# Patient Record
Sex: Female | Born: 1955 | Race: Black or African American | Hispanic: No | Smoking: Former smoker
Health system: Southern US, Community
[De-identification: ages and names within clinical notes are randomized; demographics above are authoritative.]

## PROBLEM LIST (undated history)

## (undated) ENCOUNTER — Emergency Department (HOSPITAL_COMMUNITY)

## (undated) DIAGNOSIS — I10 Essential (primary) hypertension: Secondary | ICD-10-CM

## (undated) DIAGNOSIS — N951 Menopausal and female climacteric states: Secondary | ICD-10-CM

## (undated) DIAGNOSIS — R5383 Other fatigue: Secondary | ICD-10-CM

## (undated) DIAGNOSIS — M25472 Effusion, left ankle: Secondary | ICD-10-CM

## (undated) DIAGNOSIS — R011 Cardiac murmur, unspecified: Secondary | ICD-10-CM

## (undated) DIAGNOSIS — M25474 Effusion, right foot: Secondary | ICD-10-CM

## (undated) DIAGNOSIS — G4730 Sleep apnea, unspecified: Secondary | ICD-10-CM

## (undated) DIAGNOSIS — K824 Cholesterolosis of gallbladder: Secondary | ICD-10-CM

## (undated) DIAGNOSIS — E785 Hyperlipidemia, unspecified: Secondary | ICD-10-CM

## (undated) DIAGNOSIS — M25471 Effusion, right ankle: Secondary | ICD-10-CM

## (undated) DIAGNOSIS — R7303 Prediabetes: Secondary | ICD-10-CM

## (undated) DIAGNOSIS — D509 Iron deficiency anemia, unspecified: Secondary | ICD-10-CM

## (undated) DIAGNOSIS — J449 Chronic obstructive pulmonary disease, unspecified: Secondary | ICD-10-CM

## (undated) DIAGNOSIS — Z800 Family history of malignant neoplasm of digestive organs: Secondary | ICD-10-CM

## (undated) DIAGNOSIS — T7840XA Allergy, unspecified, initial encounter: Secondary | ICD-10-CM

## (undated) DIAGNOSIS — F419 Anxiety disorder, unspecified: Secondary | ICD-10-CM

## (undated) DIAGNOSIS — J45909 Unspecified asthma, uncomplicated: Secondary | ICD-10-CM

## (undated) DIAGNOSIS — K219 Gastro-esophageal reflux disease without esophagitis: Secondary | ICD-10-CM

## (undated) DIAGNOSIS — M25475 Effusion, left foot: Secondary | ICD-10-CM

## (undated) DIAGNOSIS — Z8042 Family history of malignant neoplasm of prostate: Secondary | ICD-10-CM

## (undated) DIAGNOSIS — I639 Cerebral infarction, unspecified: Secondary | ICD-10-CM

## (undated) DIAGNOSIS — R0602 Shortness of breath: Secondary | ICD-10-CM

## (undated) DIAGNOSIS — M1990 Unspecified osteoarthritis, unspecified site: Secondary | ICD-10-CM

## (undated) DIAGNOSIS — C50919 Malignant neoplasm of unspecified site of unspecified female breast: Secondary | ICD-10-CM

## (undated) HISTORY — DX: Family history of malignant neoplasm of digestive organs: Z80.0

## (undated) HISTORY — DX: Cholesterolosis of gallbladder: K82.4

## (undated) HISTORY — DX: Effusion, left foot: M25.475

## (undated) HISTORY — DX: Allergy, unspecified, initial encounter: T78.40XA

## (undated) HISTORY — DX: Cardiac murmur, unspecified: R01.1

## (undated) HISTORY — DX: Gastro-esophageal reflux disease without esophagitis: K21.9

## (undated) HISTORY — DX: Shortness of breath: R06.02

## (undated) HISTORY — DX: Anxiety disorder, unspecified: F41.9

## (undated) HISTORY — DX: Effusion, right ankle: M25.471

## (undated) HISTORY — DX: Hyperlipidemia, unspecified: E78.5

## (undated) HISTORY — DX: Essential (primary) hypertension: I10

## (undated) HISTORY — DX: Effusion, left ankle: M25.472

## (undated) HISTORY — DX: Effusion, right foot: M25.474

## (undated) HISTORY — DX: Family history of malignant neoplasm of prostate: Z80.42

## (undated) HISTORY — DX: Iron deficiency anemia, unspecified: D50.9

## (undated) HISTORY — DX: Other fatigue: R53.83

## (undated) HISTORY — DX: Menopausal and female climacteric states: N95.1

## (undated) HISTORY — DX: Malignant neoplasm of unspecified site of unspecified female breast: C50.919

## (undated) HISTORY — DX: Chronic obstructive pulmonary disease, unspecified: J44.9

## (undated) HISTORY — DX: Cerebral infarction, unspecified: I63.9

## (undated) HISTORY — DX: Unspecified osteoarthritis, unspecified site: M19.90

## (undated) HISTORY — DX: Prediabetes: R73.03

## (undated) HISTORY — PX: COSMETIC SURGERY: SHX468

## (undated) HISTORY — DX: Unspecified asthma, uncomplicated: J45.909

## (undated) NOTE — Progress Notes (Signed)
 Formatting of this note might be different from the original. Care Management Initial Assessment Patient Name: Paige Hester MRN: [id number] Visit Number (CSN): [id number] Insurance: Payor: MEDICARE / Plan: MEDICARE PART A & B / Product Type: Medicare /   Plan: Home with family and no services.  Patient is her own decision maker, no MPOA, verified her name and date of birth at this time, patient visiting from out of town (but all demographics noted correct), and her preferred point of contact on son Leafy Mikes [phone].  PCP: Dr. LOIS Gaskins June 2025.  Pharmacy: [facility] loop.  Assessed Patient By Assessed the patient by: In person interview Contact's Relationship to Patient: Patient  Interpreter Services Does the patient or family require interpretation?: No  Demographics Admitted from: Transfer Who assists you with performing your ADLs?: Self  Patient Information ADL Prior to Hospitalization: Independent Current living situation: House Living Arrangements: Family members;Children Support System: Immediate family Responsibilities/Dependents at home: No Currently using DME?: Yes Type of DME:  (cpap) Needs additional DME?: No Currently receiving outpatient dialysis?: No  Discharge Plan Anticipated discharge plan: Home Transport to discharge destination is: Private vehicle Expected Discharge Date: 11/19/23  Financial Information Insurance listed correct?: (P) Yes Barriers to obtaining healthcare: (P) No Resources provided?: (P) No  Clinical Information / Patient History Have you been hospitalized within the last year?: (P) Yes How long ago?: (P) 6 months What were you admitted for?: (P) surgery in March 2025 to remove breast cancer At what hospital were you admitted?: (P) in [place], [place]  IMM letter signed?: (P) Yes (reviewd with the patient, she signed, and a copy provided.) Patient and/or family were provided with choice of  facilities/services that are available and appropriate to meet post hospital care needs?: (P) Home - no needs Patient/Family agree with discharge plan?: (P) Yes  Completed by: Janeen Pacini, RN   [facility] CASE MANAGEMENT [id number] [place] [address] Phone: [phone] Fax: [phone]  Electronically signed by Pacini Janeen, RN at 11/17/2023 12:01 PM CDT

## (undated) NOTE — Unmapped External Note (Signed)
 Summary: skin assessment  Formatting of this note might be different from the original. Encounter:  Initial   Screening/Prevention Status:  7 day screening  Recommendations: MOBILITY, ACTIVITY or SENSORY PERCEPTION RECOMMENDATION(s):   Turn/reposition Q2H and Pressure redistribution MOISTURE MANAGEMENT RECOMMENDATION(s):   Breathable absorbent pads and Avoid plastic back pads All skin intact to anterior and posterior of body with no evidence of pressure injuries. Patient will continue to be monitored via routine skin assessments with frequency based on braden scale. Current Braden score of 23. Patient able to turn independently at time of assessment. Educated patient on the importance of turning.   Electronically signed by Chandra Domino, RN at 11/17/2023  1:04 PM CDT

## (undated) NOTE — Progress Notes (Signed)
 Formatting of this note is different from the original. Patient ID: Paige Hester is a 89 y.o. female.  Chief Complaint  Patient presents with   Back Pain    Lower back pain x3w. Patient had a CT and is now requesting an MRI because she is not better.   Subjective:    History of Present Illness Paige Hester is a 78 year old female who presents with recurrent low back pain. She is temporarily residing in the area while visiting her son and grandchild.  She has been experiencing severe low back pain for a couple of weeks, which is so intense that she is unable to stand or sit for more than two minutes, necessitating lying down most of the time. The pain initially began following a fall in July (3-73mo ago), after which imaging showed no fractures or sprains. Tylenol  was effective in resolving her symptoms until October.  Recently, she visited an emergency center due to the severity of her back pain. She was told by the emergency center that the CT scan did not show any concerning findings. She received a pain shot and a steroid injection, which provided temporary relief, along with a five-day course of medication and a muscle relaxer. However, the pain has returned after discontinuing the medication.  The pain is localized to the low back and extends to the bottom of her buttocks but does not radiate down her legs. No urinary incontinence, thigh numbness, or other neurological symptoms. She is able to perform daily stretching exercises without pain, but prolonged sitting exacerbates her symptoms.  She is currently not taking any medications for her back pain, as the previous course of treatment has ended.   Objective:  BP (!) 144/77 (BP Location: Right arm, Patient Position: Sitting)   Pulse 67   Temp 36.7 C (98 F) (Temporal)   Resp 16   Ht 1.651 m (5' 5)   Wt 110.7 kg (244 lb)   LMP  (LMP Unknown)   SpO2 98%   BMI 40.60 kg/m    Physical Exam  Physical Exam   Constitutional: She  is oriented to person, place, and time. She is well-developed, overweight and well-nourished.  Eyes: Pupils are equal, round, and reactive to light. EOM are normal.    Extraocular Movements: EOM normal.   Cardiovascular:  Murmur heard. Pulmonary/Chest: Effort normal and breath sounds normal.  Musculoskeletal:     Comments: Clinically aligned without step-off's. TTP along the right para-spinal muscles of the lumbar. FROM of the spine without pain    Results CT (Performed at [facility] in July 2025): Severe spinal canal stenosis @ L4-L5. Multi-level mild to moderate neural foraminal stenosis      Assessment and Plan:   Assessment & Plan  1. Spinal stenosis of lumbar region without neurogenic claudication (Primary)  2. Chronic right-sided low back pain without sciatica -     MRI Lumbar spine wo contrast; Future; Expected date: 01/26/2024  Other orders -     Naproxen; Take 1 tablet (500 mg total) by mouth 2 (two) times daily with meals  for 7 days  Dispense: 14 tablet; Refill: 0 -     Cyclobenzaprine HCl; Take 1 tablet (10 mg total) by mouth 3 (three) times daily as needed for Muscle spasms  Dispense: 30 tablet; Refill: 0  Low back pain Chronic low back pain exacerbated, localized to lower back and buttocks, no radiation, no urinary incontinence or thigh numbness. Previous CT scan negative for acute findings; positive for spinal  stenosis. Pain improved with steroids and muscle relaxers but recurred post-discontinuation. - Ordered MRI of lumbar spine. - Prescribed naproxen with food. - Prescribed muscle relaxers. - Advised scheduling MRI promptly. - Recommended discussing with primary care provider for orthopedic or physical therapy referral as she is currently visiting from out of state.  The entire treatment plan and recommendations were discussed with the patient prior to the conclusion of the visit. Patient verbalized understanding and is comfortable with the  plan. No other concerns or questions at this time.    Metta PHEBE Pili, PA-C  Electronically signed by Pili Metta FALCON, PA-C at 01/26/2024 12:50 PM CST

## (undated) NOTE — Unmapped External Note (Signed)
 Formatting of this note might be different from the original. Patient is discharged home.vital sings stable, discharge education provided, patient to be picked up by the son.Iv line removed,bleeding controlled, tele monitor taken off. Electronically signed by Kathalene Kenneth Burr, RN at 11/18/2023  7:33 PM CDT

## (undated) NOTE — Progress Notes (Signed)
 Formatting of this note might be different from the original. Discharge planning completed with the patient while at bedside. Plan is for the patient to return home with family without any services. No questions or concerns voiced at this time. Will continue to follow for all discharge needs.  Electronically signed by Gail Lust, RN at 11/18/2023 10:20 AM CDT

## (undated) NOTE — Consults (Signed)
 Associated Order(s): IP CONSULT TO CARDIOLOGY Formatting of this note might be different from the original. Clinical Nutrition Education  Reason for Education:  Provider consult  Behavior/Environmental Findings:  Food, nutrition, and nutrition-related knowledge deficit  Patient/Family instructed on following diet/nutrition topic: Weight Management  The following MNT handout(s) was/were Provided/discussed Handout(s): Weight reduction tips , Heart healthy shopping tips, out dining tips, low sodium seasoning tips   Discussed with patient/family: Diet modifications  Learning Barriers: N/A  Level of Understanding:  Good  Receptivity:  Good  Expectations for Compliance:  Good   Interpreter Services utilized?  No  Need for follow up inpatient/outpatient education?  YES, patient to benefit from OP RD services  Pt sitting in bed. Brief discussion of handouts . No concerns or questions reported  Patient/family made aware they may request additional education, at least 24 hours prior to discharge.  Please consult RD upon patient's request.  Sonja Forest, RD, LD   Electronically signed by Forest Sonja, RD, LD at 11/17/2023 10:24 AM CDT

## (undated) NOTE — Unmapped External Note (Signed)
 Formatting of this note might be different from the original.  Problem: Infection Risk Goal: Stabilize- Infection Risk Description: Increased chance of contamination with disease-producing germs. Outcome: Stabilized  Electronically signed by Bevely Setter, RN at 11/17/2023 12:42 AM CDT

## (undated) NOTE — Progress Notes (Signed)
 Formatting of this note is different from the original. Images from the original note were not included.   Subjective: Patient seen and examined bedside.  Currently denies chest pain or shortness of breath.  Family at bedside.  Objective:  In no acute distress.  Scheduled Meds:  amLODIPine   10 mg Oral Daily   aspirin   81 mg Oral Daily   CHOLEcalciferol  1,000 Units Oral Daily   docusate sodium   100 mg Oral BID   furosemide (Lasix) IV injection  20 mg Intravenous BID (Diuretic)   heparin   5,000 Units Subcutaneous Q8H SCH   Insulin Lispro  4-20 Units Subcutaneous TID with meals   losartan -hydrochlorothiazide  100-12.5 combo dose   Oral Daily   rosuvastatin   20 mg Oral HS   spironolactone  25 mg Oral Daily   Continuous Infusions:  PRN Meds:acetaminophen , dextrose, dextrose, dextrose, dextrose, glucagon, hydrALAZINE, HYDROcodone -acetaminophen , melatonin, nitroglycerin, ondansetron   Objective: Vital signs in last 24 hours: Temp:  [36.2 C (97.1 F)-36.7 C (98 F)] 36.2 C (97.2 F) Heart Rate:  [62-78] 72 Resp:  [16-24] 16 BP: (120-165)/(50-73) 120/69  Intake/Output last 3 shifts:  No intake/output data recorded.  Intake/Output this shift: No intake/output data recorded.  PHYSICAL EXAM   General Appearance:    Alert, cooperative, no distress  Head:    Normocephalic,  atraumatic  Eyes:    PERRL, conjunctiva/corneas clear, EOM's intact  Throat: Moist mucous membranes  Neck:   Supple,  Lungs:     Clear to auscultation bilaterally, respirations unlabored  Heart:    Regular rate and rhythm, S1 and S2 normal,  Abdomen:     Soft, non-tender, bowel sounds active all four quadrants,    no masses, no organomegaly  Extremities:   Extremities normal, atraumatic, no cyanosis or edema   skin:   Skin color, texture, turgor normal, no rashes or lesions  Neurologic:   CNII-XII intact. Normal strength, sensation and reflexes      throughout   Labs: CBC: Recent Labs  Lab  11/17/23 0430  WBC 7.0  RBC 4.03  PLT 270  HGB 11.2*  HCT 34.3*  MCV 85.1  MCH 27.8  MCHC 32.7  RDW 13.4  MPV 9.5   CMP: Recent Labs  Lab 11/17/23 0401  CALCIUM  9.3  ALBUMIN 3.1*  NA 142  K 2.8*  CO2 28  CL 105  BUN 16  CREATININE 0.80   Coags     Urinalysis      No results found for this or any previous visit. Microbiology Results (Current Admission)     None     No results found for the last 90 days.  IMAGING No results found.  Assessment/Plan: Paige Hester is a pleasant 42 year old female with PMH of HTN, dyslipidemia, heart murmur, right lung PE(diagnosed before her breast cancer diagnosis) and a recent left breast lumpectomy for breast cancer(surgery on March 6) presented to [facility] emergency room complaining of acute onset left-sided chest pain that began approximately 1 hour prior to arrival to the emergency room.  She describes the pain as severe 9 out of 10 pressure-like(sitting on chest) and localized to the left chest, near the site of prior breast surgery.  She reported that a few days ago she had difficulty taking deep breaths, though not true shortness of breath, and today she had mild exertional dyspnea.  The pain was not related to level of activity.she thought it could be from her left breast lumpectomy she had in March for which she received radiation  therapy.  She denies any nausea, vomiting, diaphoresis, dizziness, lightheadedness, cough, nasal congestion, sore throat, or leg swelling.  She took 1200 mg of Tylenol  about an hour prior to arrival to the emergency room for pain, with minimal relief.  She has a history of radiation to the left breast. Her workup showed a normal CBC and renal panel except for a slightly low potassium of 3.4.  Her chest x-ray showed pulmonary vascular congestion; her high-sensitivity troponin was less than 4 x 2 and her BNP was 86. In the emergency room she was treated with Toradol  for pain and Lasix IV. I have  consulted Dr. Fabian.  ASSESSMENT  Chest pain Admit the patient to telemetry unit Optimize medical management 2D echo Stress test per cardiology  Pulmonary vascular congestion Hypertension Continue home blood pressure medications  Dyslipidemia Continue statin  History of right PE History of left breast cancer Resume home Eliquis    Discussed with patient/family/RN/MDR team on the plan of care in detail.  Recheck labs in AM.  DVT prophylaxis--Eliquis  I certify that have carefully and personally reviewed patient's chart, labs, imaging, vitals, ED notes, consultant notes which were all used in decision and planning.  Colon Shaker, MD    Electronically signed by Shaker Colon, MD at 11/17/2023 12:56 PM CDT Electronically signed by Shaker Colon, MD at 11/18/2023  1:18 PM CDT

## (undated) NOTE — Telephone Encounter (Signed)
 Formatting of this note might be different from the original. Patient called stating she has her Stress test results. Did advise patient, the provider note states that patient follow up w/cardio and take the ST results. Per patient she is not leaving back to [place] until November. She is asking for a referral to cardio here in [place]. Patient was given Dr. Army information. Per patient she has a couple of cardiologist numbers she was given while in patient. Per patient she will call back with info. Electronically signed by Michaelle Knee at 12/23/2023  4:16 PM CDT

## (undated) NOTE — Telephone Encounter (Signed)
 Formatting of this note might be different from the original. Called patient regarding On Demand appointment, patient recently discharged from hospital and was trying to obtain results and had questions regarding recent care. Mentions, she resides in  but in [place]  visiting when fell ill.  Assisted patient with physician directory, [facility] site. She was hoping to be able to do a video visit with that provider. Patient made aware of new Medicare guidelines effective October 1st, of video visits not being covered for time being. Patient instructed will need to call Drs office directly for an appointment.  Patient voiced understanding and thankful for information. Appointment canceled. Electronically signed by Jackson Channel, MA at 12/16/2023  9:36 AM CDT

## (undated) NOTE — Progress Notes (Signed)
 Formatting of this note might be different from the original. The patient is discharged home,has been picked up by transport tech to the lobby for home dispatch via private transport.iv line discontinued and cardiac monitor taken off, monitor tech informed. Electronically signed by Kathalene Kenneth Burr, RN at 11/18/2023  9:12 PM CDT

## (undated) NOTE — Progress Notes (Signed)
 Formatting of this note is different from the original. Images from the original note were not included.   Subjective: Patient seen and examined bedside.  Currently denies chest pain or shortness of breath.  Family at bedside.  Underwent stress test today  Objective:  In no acute distress.  Scheduled Meds:  aspirin   81 mg Oral Daily   carvedilol  3.125 mg Oral Q12H SCH with food   CHOLEcalciferol  1,000 Units Oral Daily   docusate sodium   100 mg Oral BID   furosemide (Lasix) IV injection  20 mg Intravenous BID (Diuretic)   Insulin Lispro  4-20 Units Subcutaneous TID with meals   losartan -hydrochlorothiazide  100-12.5 combo dose   Oral Daily   rosuvastatin   20 mg Oral HS   spironolactone  25 mg Oral Daily   Continuous Infusions:  PRN Meds:acetaminophen , apixaban , dextrose, dextrose, dextrose, dextrose, glucagon, hydrALAZINE, HYDROcodone -acetaminophen , melatonin, nitroglycerin, ondansetron   Objective: Vital signs in last 24 hours: Temp:  [36.2 C (97.2 F)-36.4 C (97.6 F)] 36.2 C (97.2 F) Heart Rate:  [57-71] 68 Resp:  [16-18] 18 BP: (102-147)/(57-86) 102/65  Intake/Output last 3 shifts:  I/O last 3 completed shifts: In: 1000 [P.O.:1000] Out: 1600 [Urine:1600]  Intake/Output this shift: I/O this shift: In: -  Out: 800 [Urine:800]  PHYSICAL EXAM   General Appearance:    Alert, cooperative, no distress  Head:    Normocephalic,  atraumatic  Eyes:    PERRL, conjunctiva/corneas clear, EOM's intact  Throat: Moist mucous membranes  Neck:   Supple,  Lungs:     Clear to auscultation bilaterally, respirations unlabored  Heart:    Regular rate and rhythm, S1 and S2 normal,  Abdomen:     Soft, non-tender, bowel sounds active all four quadrants,    no masses, no organomegaly  Extremities:   Extremities normal, atraumatic, no cyanosis or edema   skin:   Skin color, texture, turgor normal, no rashes or lesions  Neurologic:   CNII-XII intact. Normal strength, sensation and  reflexes      throughout   Labs: CBC: Recent Labs  Lab 11/17/23 0430  WBC 7.0  RBC 4.03  PLT 270  HGB 11.2*  HCT 34.3*  MCV 85.1  MCH 27.8  MCHC 32.7  RDW 13.4  MPV 9.5   CMP: Recent Labs  Lab 11/18/23 0232 11/17/23 0401  CALCIUM  9.7 9.3  ALBUMIN  --  3.1*  NA 140 142  K 3.4* 2.8*  CO2 26 28  CL 104 105  BUN 15 16  CREATININE 0.80 0.80   Coags     Urinalysis      No results found for this or any previous visit. Microbiology Results (Current Admission)     None     No results found for the last 90 days.  IMAGING No results found.  Assessment/Plan: [name] is a pleasant 49 year old female with PMH of HTN, dyslipidemia, heart murmur, right lung PE(diagnosed before her breast cancer diagnosis) and a recent left breast lumpectomy for breast cancer(surgery on March 6) presented to [facility] emergency room complaining of acute onset left-sided chest pain that began approximately 1 hour prior to arrival to the emergency room.  She describes the pain as severe 9 out of 10 pressure-like(sitting on chest) and localized to the left chest, near the site of prior breast surgery.  She reported that a few days ago she had difficulty taking deep breaths, though not true shortness of breath, and today she had mild exertional dyspnea.  The pain was  not related to level of activity.she thought it could be from her left breast lumpectomy she had in March for which she received radiation therapy.  She denies any nausea, vomiting, diaphoresis, dizziness, lightheadedness, cough, nasal congestion, sore throat, or leg swelling.  She took 1200 mg of Tylenol  about an hour prior to arrival to the emergency room for pain, with minimal relief.  She has a history of radiation to the left breast. Her workup showed a normal CBC and renal panel except for a slightly low potassium of 3.4.  Her chest x-ray showed pulmonary vascular congestion; her high-sensitivity troponin was less than 4 x 2  and her BNP was 86. In the emergency room she was treated with Toradol  for pain and Lasix IV. I have consulted Dr. Fabian.  ASSESSMENT  Chest pain Admit the patient to telemetry unit Optimize medical management 2D echo Stress test per cardiology  Pulmonary vascular congestion Hypertension Continue home blood pressure medications  Dyslipidemia Continue statin  History of right PE History of left breast cancer Resume home Eliquis   Hypokalemia, acute Replace electrolytes   Discussed with patient/family/RN/MDR team on the plan of care in detail.  Recheck labs in AM.  DVT prophylaxis--Eliquis  I certify that have carefully and personally reviewed patient's chart, labs, imaging, vitals, ED notes, consultant notes which were all used in decision and planning.  Colon Shaker, MD    Electronically signed by Shaker Colon, MD at 11/18/2023  1:18 PM CDT

## (undated) NOTE — H&P (Signed)
 Formatting of this note is different from the original. Paige Hester is an 55 y.o. female.  HPI   Paige Hester is a pleasant 30 year old female with PMH of HTN, dyslipidemia, heart murmur, right lung PE(diagnosed before her breast cancer diagnosis) and a recent left breast lumpectomy for breast cancer(surgery on March 6) presented to [facility] emergency room complaining of acute onset left-sided chest pain that began approximately 1 hour prior to arrival to the emergency room.  She describes the pain as severe 9 out of 10 pressure-like(sitting on chest) and localized to the left chest, near the site of prior breast surgery.  She reported that a few days ago she had difficulty taking deep breaths, though not true shortness of breath, and today she had mild exertional dyspnea.  The pain was not related to level of activity.she thought it could be from her left breast lumpectomy she had in March for which she received radiation therapy.  She denies any nausea, vomiting, diaphoresis, dizziness, lightheadedness, cough, nasal congestion, sore throat, or leg swelling.  She took 1200 mg of Tylenol  about an hour prior to arrival to the emergency room for pain, with minimal relief.  She has a history of radiation to the left breast. Her workup showed a normal CBC and renal panel except for a slightly low potassium of 3.4.  Her chest x-ray showed pulmonary vascular congestion; her high-sensitivity troponin was less than 4 x 2 and her BNP was 86. In the emergency room she was treated with Toradol  for pain and Lasix IV. I have consulted Dr. Fabian.  ROS Review of systems all systems reviewed otherwise negative except for those mentioned in HPI.   General -patient denies having any fevers chills weight loss.   Eyes no blurred vision double vision.   ENT no sore throat neck pain.   Cardiovascular no chest pain no palpitations.  Respiratory no shortness of breath, cough or wheeze. GI no abdominal pain  nausea vomiting . GU no hematuria urgency dysuria frequency . Musculoskeletal no calf pain leg pain back pain.  neurologic no focal weakness as well as numbness. Psychiatric no depression /anxiety . skin -NOrashes or bruises . Lymph nodes -denies having any palpable lymphadenopathy.  Past Medical History[1]  Past Surgical History[2]  Family History[3]  Social History   Socioeconomic History   Marital status: Legally Separated    Spouse name: Not on file   Number of children: Not on file   Years of education: Not on file   Highest education level: Not on file  Occupational History   Not on file  Tobacco Use   Smoking status: Never    Passive exposure: Never   Smokeless tobacco: Never  Vaping Use   Vaping status: Never Used  Substance and Sexual Activity   Alcohol use: Never   Drug use: Never   Sexual activity: Never  Other Topics Concern   Not on file  Social History Narrative   Not on file   Social Drivers of Health   Financial Resource Strain: Low Risk  (11/17/2023)   Overall Financial Resource Strain (CARDIA)    Difficulty of Paying Living Expenses: Not hard at all  Recent Concern: Financial Resource Strain - Medium Risk (10/03/2023)   Received from [facility]   Overall Financial Resource Strain (CARDIA)    How hard is it for you to pay for the very basics like food, housing, medical care, and heating?: Somewhat hard  Food Insecurity: No Food Insecurity (11/17/2023)   Hunger Vital  Sign    Worried About Programme researcher, broadcasting/film/video in the Last Year: Never true    Ran Out of Food in the Last Year: Never true  Recent Concern: Food Insecurity - Food Insecurity Present (10/03/2023)   Received from [facility]   Hunger Vital Sign    Worried About Running Out of Food in the Last Year: Sometimes true    Ran Out of Food in the Last Year: Sometimes true  Transportation Needs: No Transportation Needs (11/17/2023)   PRAPARE - Administrator, Civil Service (Medical): No   Recent Concern: Transportation Needs - Unmet Transportation Needs (10/03/2023)   Received from [facility] - Transportation    In the past 12 months, has lack of transportation kept you from medical appointments or from getting medications?: No    In the past 12 months, has lack of transportation kept you from meetings, work, or from getting things needed for daily living?: Yes  Physical Activity: Inactive (10/03/2023)   Received from [facility]   Exercise Vital Sign    Days of Exercise per Week: 0 days    Minutes of Exercise per Session: Not on file  Stress: No Stress Concern Present (10/03/2023)   Received from [facility] - Occupational Stress Questionnaire    Do you feel stress - tense, restless, nervous, or anxious, or unable to sleep at night because your mind is troubled all the time - these days?: Not at all  Social Connections: Moderately Integrated (10/03/2023)   Received from [facility]   Social Connection and Isolation Panel [NHANES]    Frequency of Communication with Friends and Family: More than three times a week    Frequency of Social Gatherings with Friends and Family: Three times a week    Attends Religious Services: More than 4 times per year    Active Member of Clubs or Organizations: Yes    Attends Banker Meetings: More than 4 times per year    Marital Status: Separated  Intimate Partner Violence: Not At Risk (11/17/2023)   Humiliation, Afraid, Rape, and Kick questionnaire    Fear of Current or Ex-Partner: No  Housing Stability: Low Risk  (11/17/2023)   Housing Stability Vital Sign    Unstable Housing in the Last Year: No  Recent Concern: Housing Stability - High Risk (10/03/2023)   Received from [facility] Stability Vital Sign    Unable to Pay for Housing in the Last Year: Yes    Number of Times Moved in the Last Year: 0    Homeless in the Last Year: No   Allergies: Allergies[4]  Blood  pressure (!) 153/72, pulse 78, temperature 36.2 C (97.1 F), temperature source Temporal, resp. rate 24, SpO2 94%.  PHYSICAL EXAM BP (!) 153/72 (BP Location: Right arm, Patient Position: Sitting)   Pulse 78   Temp 36.2 C (97.1 F) (Temporal)   Resp 24   SpO2 94%   General Appearance:    Alert, cooperative, no distress  Head:    Normocephalic,  atraumatic  Eyes:    PERRL, conjunctiva/corneas clear, EOM's intact  Throat: Moist mucous membranes  Neck:   Supple,  Lungs:     Clear to auscultation bilaterally, respirations unlabored  Heart:    Regular rate and rhythm, S1 and S2 normal,  Abdomen:     Soft, non-tender, bowel sounds active all four quadrants,    no masses, no  organomegaly  Extremities:   Extremities normal, atraumatic, no cyanosis or edema   skin:   Skin color, texture, turgor normal, no rashes or lesions  Neurologic:   CNII-XII intact. Normal strength, sensation and reflexes      throughout   Lab: No results found for this or any previous visit (from the past 24 hours). Blood Cultures     Urine Cultures      IMAGING No results found.  Assessment/Plan:  Paige Hester is a pleasant 26 year old female with PMH of HTN, dyslipidemia, heart murmur, right lung PE(diagnosed before her breast cancer diagnosis) and a recent left breast lumpectomy for breast cancer(surgery on March 6) presented to [facility] emergency room complaining of acute onset left-sided chest pain that began approximately 1 hour prior to arrival to the emergency room.  She describes the pain as severe 9 out of 10 pressure-like(sitting on chest) and localized to the left chest, near the site of prior breast surgery.  She reported that a few days ago she had difficulty taking deep breaths, though not true shortness of breath, and today she had mild exertional dyspnea.  The pain was not related to level of activity.she thought it could be from her left breast lumpectomy she had in March for which she received  radiation therapy.  She denies any nausea, vomiting, diaphoresis, dizziness, lightheadedness, cough, nasal congestion, sore throat, or leg swelling.  She took 1200 mg of Tylenol  about an hour prior to arrival to the emergency room for pain, with minimal relief.  She has a history of radiation to the left breast. Her workup showed a normal CBC and renal panel except for a slightly low potassium of 3.4.  Her chest x-ray showed pulmonary vascular congestion; her high-sensitivity troponin was less than 4 x 2 and her BNP was 86. In the emergency room she was treated with Toradol  for pain and Lasix IV. I have consulted Dr. Fabian.  ASSESSMENT  Atypical chest pain Pulmonary vascular congestion HTN Dyslipidemia History of right PE History of left breast cancer  PLAN  Admit to M/S tele Appreciate Dr. Janyce consult Trend troponins Cardiac diet SL NTG prn  2D ECHO Hydralazine IV as needed elevated BP Check hemoglobin A1c Consult dietitian for healthy weight loss diet Monitor vitals Follow labs  Discussed with patient/family/RN on the plan of care in detail.  Recheck labs in AM.  DVT prophylaxis--B SCD's/Heparin   I certify that have carefully and personally reviewed patient's chart, labs, imaging, vitals, ED notes, consultant notes which were all used in decision and planning. Norman Anton Holly, MD 11/17/2023   [1]  Past Medical History: Diagnosis Date   Heart murmur    Hyperlipidemia    Hypertension   [2]  Past Surgical History: Procedure Laterality Date   BREAST LUMPECTOMY Left   [3] History reviewed. No pertinent family history. [4] No Known Allergies  Electronically signed by Holly Norman Anton, MD at 11/17/2023  1:22 AM CDT

## (undated) NOTE — Unmapped External Note (Signed)
 Formatting of this note might be different from the original.  Problem: Infection Risk Goal: Stabilize- Infection Risk Description: Increased chance of contamination with disease-producing germs. Outcome: Stabilized  Electronically signed by Matilde Pap, RN at 11/17/2023  8:39 AM CDT

## (undated) NOTE — Progress Notes (Signed)
 Formatting of this note is different from the original. Images from the original note were not included.   CARDIOLOGY PROGRESS NOTE  Patient Care Team: Per Patient, No Pcp as PCP - General (Family Medicine)  NAME: Paige Hester MRN: [id number] CSN: [id number]  LENGTH OF STAY: 2  SUBJECTIVE: Paige Hester is a pleasant 75 y.o.  female   NEW EVENTS OVERNIGHT: No new events  PAST MEDICAL HISTORY: Past Medical History[1]  PAST SURGICAL HISTORY: Past Surgical History[2]  FAMILY HISTORY: Family History[3]  SOCIAL HISTORY:  reports that she has never smoked. She has never been exposed to tobacco smoke. She has never used smokeless tobacco. She reports that she does not drink alcohol and does not use drugs.  ALLERGIES: Allergies[4]  MEDICATIONS:  aspirin   81 mg Oral Daily   carvedilol  3.125 mg Oral Q12H SCH with food   CHOLEcalciferol  1,000 Units Oral Daily   docusate sodium   100 mg Oral BID   furosemide (Lasix) IV injection  20 mg Intravenous BID (Diuretic)   Insulin Lispro  4-20 Units Subcutaneous TID with meals   losartan -hydrochlorothiazide  100-12.5 combo dose   Oral Daily   potassium chloride   40 mEq Oral Once   rosuvastatin   20 mg Oral HS   spironolactone  25 mg Oral Daily   OBJECTIVE: RECENT VITALS:  Blood pressure 134/86, pulse 57, temperature 36.3 C (97.3 F), resp. rate 18, height 1.651 m (5' 5), weight 113.4 kg (250 lb), SpO2 97%. VITAL RANGES: Temp:  [36.2 C (97.2 F)-36.3 C (97.4 F)] 36.3 C (97.3 F) Heart Rate:  [57-72] 57 Resp:  [16-18] 18 BP: (112-134)/(57-86) 134/86   Intake/Output Summary (Last 24 hours) at 11/18/2023 0935 Last data filed at 11/18/2023 0600 Gross per 24 hour  Intake 1000 ml  Output 1600 ml  Net -600 ml   I/O last 3 completed shifts: In: 1000 [P.O.:1000] Out: 1600 [Urine:1600]    Telemetry monitor observed: NSR.  PHYSICAL EXAM:  Constitutional: appears healthy. No distress. Staged for age. HENT:  No  ears or nose discharge. No acute findings. Mouth/Throat: Dentition is normal. Oropharynx is clear.  Eyes: Conjunctivae are normal. Pupils are equal, round, and reactive to light.  Neck: Neck supple. No JVD present. No thyromegaly present. No carotids bruit.  Cardiovascular: Normal rate, regular rhythm, normal S1 and  S2.SABRA No murmur. PMI is not displaced.  Pulmonary/Chest: Effort normal and breath sounds normal. No wheezes, no rales, no tenderness.  Abdominal: Soft. Bowel sounds are normal. No mass, no splenomegaly or hepatomegaly.  Musculoskeletal: Normal range of motion. Normal temperature, No edema.  Neurological: alert and oriented to person, place, and time. No focal deficit.  Skin: warm and dry. No cyanosis. No jaundice or pallor. No clubbing.   RESULTS: Recent Results (from the past 24 hours)  Bedside Glucose, POC   Collection Time: 11/17/23 11:38 AM  Result Value Ref Range   Glucose, POC 115 80 - 115 mg/dL   Specimen Type BLOOD   Bedside Glucose, POC   Collection Time: 11/17/23  3:24 PM  Result Value Ref Range   Glucose, POC 112 80 - 115 mg/dL   Specimen Type BLOOD   Basic Metabolic Panel   Collection Time: 11/18/23  2:32 AM  Result Value Ref Range   Glucose 90 80 - 115 mg/dL   BUN 15 7 - 21 mg/dL   Creatinine 9.19 9.39 - 1.10 mg/dL   Est Glomerular Filtration Rate 80.21 >=60.00 mL/min/1.20m2   Sodium 140 131 - 143 mmol/L  Potassium 3.4 (L) 3.5 - 5.1 mmol/L   Chloride 104 98 - 107 mmol/L   Carbon Dioxide 26 17 - 31 mmol/L   Anion Gap 10 (L) 11-17 mmol/L mmol/L   BUN/Creatinine Ratio 19 6 - 20 mg/dL   Osmolality, Calculated 280 275 - 295   Calcium  9.7 7.7 - 10.1 mg/dL  Magnesium   Collection Time: 11/18/23  2:32 AM  Result Value Ref Range   Magnesium 1.8 1.3 - 2.7 mg/dL   No results found.  I reviewed the patient's telemetry, available ECG, and any pertinent clinical data and results.  PROBLEM LIST: Problem List[5]  ASSESSMENT / PLAN: 1. Pulmonary edema -  ongoing rales on exam. Echo in March, 2025 with severe LVH and moderate mitral stenosis. SBP significantly elevated on presentation to ER >200/100, likely the etiology of flash pulmonary edema. Recommend con't gentle IV diuresis. Stop IVF and replace potassium to maintain >4 and mag >2.   2. Chest pain - improved with toradol . Recent radiation to left chest wall to treat breast cancer. Concern for pleuritis vrs pericarditis vrs coronary artery disease. Troponin neg x 3 sets. EKG with prior infarct. Recommend starting ASA 81 mg PO daily. Con't statin therapy. We will check ESR/CRP and CK levels. Recommend nuclear chemical stress test. Unable to complete today due to lack of availability with the nuclear stress lab. Please keep NPO after MN for stress test tomorrow morning.   3. HTN - BP mildly elevated this morning. Recommend con't losartan /HCTZ and starting coreg 3.125 mg BID for improved diastolic filling time with severe LVH and mod Paige. Con't IV lasix and start sprionolactone. HOLD outpatient amlodipine  for now. Con't low sodium diet.  4. LVH -severe on prior echo. We will repeat an echo and monitor. Additional recs as above.   5. Mitral stenosis - moderate - we will start coreg as above and proceed with echocardiogram.   9/3 - Potassium/magnesium replaced. Chest pain improved. ESR/CRP elevated c/w inflammation from radiation to left chest wall/pleura. Stress test with small fixed apical defect c/w prior infarct vrs artifact. No ischemia. Echo unchanged with severe LVH and moderate Paige. Okay to DC home with coreg 3.125 mg BID, losartan /HCTZ 100/12.5, spironolactone 25 mg PO daily and lasix 20 mg PO daily. Recommend f/u in my office in 1 week to monitor response. We will check labs in 1 week.    I appreciate the opportunity to provide cardiovascular care for this patient.  Please call [phone] for any questions or concerns  Harlene Mulch DNP ACNP-BC   11/18/2023 9:35 AM CDT    [1]  Past  Medical History: Diagnosis Date   Heart murmur    Hyperlipidemia    Hypertension   [2]  Past Surgical History: Procedure Laterality Date   BREAST LUMPECTOMY Left   [3] History reviewed. No pertinent family history. [4] No Known Allergies [5]  Patient Active Problem List Diagnosis   Atypical chest pain   Electronically signed by Mulch Harlene Dragon, NP at 11/18/2023  4:15 PM CDT

## (undated) NOTE — Progress Notes (Signed)
 Formatting of this note might be different from the original. [facility] [phone] Electronically signed by Lurline Jama BIRCH, RN at 11/17/2023 12:11 AM CDT

## (undated) NOTE — Progress Notes (Signed)
 Formatting of this note is different from the original. Subjective:    Patient ID: Paige Hester is a 8 y.o. female.  HPI PT here for refill on Lasix-   States had called Cardiology office and they advised f/u in urgent care for refills.   Pt states has been on meds daily for last 30 days.  Has had no issues and is requesting refills.   She lives in   is here for family issues.   Denies HA/Dizziness / N/V or other issues   Review of Systems  Constitutional: Negative.   HENT: Negative.    Eyes: Negative.   Respiratory: Negative.    Cardiovascular: Negative.   Gastrointestinal: Negative.   Endocrine: Negative.   Genitourinary: Negative.     Objective:   BP 133/79 (BP Location: Right arm, Patient Position: Sitting)   Pulse 66   Temp 37 C (98.6 F) (Temporal)   Resp 19   Ht 1.651 m (5' 5)   Wt 110.8 kg (244 lb 3.2 oz)   LMP  (LMP Unknown)   SpO2 97%   BMI 40.64 kg/m   Physical Exam  Constitutional: She  is alert and is oriented to person, place, and time. She is well-developed and well-nourished.  Cardiovascular: Normal rate, regular rhythm, normal heart sounds and intact distal pulses.  No swelling noted    Pulmonary/Chest: Effort normal and breath sounds normal.  Musculoskeletal:        General: No tenderness or edema. Normal range of motion.     Cervical back: Normal range of motion.  Skin: Skin is warm and dry.     Assessment and Plan:   Blood for CMP drawn with one stick to R ACF=  hx of breast surgery to L arm -    CMP done -  all acceptable range   1. Medication refill  Comprehensive Metabolic Panel, POCT    Refill of Lasix done -  to return to [place] within next 2 months- Advised to f/u if any concerns-  HA , dizziness etc.   PT advised to call Cardiology office to get F/U appt and copy of Stress Test.       Electronically signed by Myrna Lew, FNP-C at 12/16/2023  2:53 PM CDT

## (undated) NOTE — Consults (Signed)
 Formatting of this note is different from the original. Images from the original note were not included.   CARDIOLOGY CONSULT  MRN: [id number] CSN: [id number]  ADMIT DATE: 11/16/2023  LENGTH OF STAY: 1  REQUESTING PHYSICIAN: Dr. Deveron  REASON FOR THE CONSULT: CHF  HISTORY OF PRESENT ILLNESS: [name] is a pleasant 69 y.o.  female with  PMH of HTN, dyslipidemia, severe LVH and moderate mitral stenosis on echo 05/2023,, right lung PE(diagnosed before her breast cancer diagnosis) and a recent left breast lumpectomy for breast cancer(surgery on March 6) with radiation over left breast presented to [facility] emergency room complaining of acute onset left-sided chest pain that began approximately 1 hour prior to arrival to the emergency room.  She describes the pain as severe 9 out of 10 pressure-like(sitting on chest) and localized to the left chest. She reported that a few days ago she had difficulty taking deep breaths, though not true shortness of breath, and today she had mild exertional dyspnea. She denies any nausea, vomiting, diaphoresis, dizziness, lightheadedness, cough, nasal congestion, sore throat, or leg swelling.  She took 1200 mg of Tylenol  about an hour prior to arrival to the emergency room for pain, with minimal relief.    Her workup showed a normal CBC and renal panel except for a slightly low potassium of 3.4.  Her chest x-ray showed pulmonary vascular congestion; her high-sensitivity troponin was less than 4 x 2 and her BNP was 86.CTA chest with small left pleural effusion, trace pericardial effusion and pleural thickening to the superior aspect of the left hemithorax.   In the emergency room she was treated with Toradol  for pain and Lasix IV. She reports improvement in pain, however, still rates pain at 6/10 this morning. Reports ongoing DOE. No recent stress test. Sister with recent CABG.    ---------------------------------------------------------------------------  REVIEW OF SYSTEMS: 14 point ROS completed. All positives as per HPI, all other neg  PAST MEDICAL HISTORY: Past Medical History[1]  PAST SURGICAL HISTORY: Past Surgical History[2]  FAMILY HISTORY: Family History[3]  SOCIAL HISTORY:  reports that she has never smoked. She has never been exposed to tobacco smoke. She has never used smokeless tobacco. She reports that she does not drink alcohol and does not use drugs.  ALLERGIES: Allergies[4]  MEDICATIONS:  aspirin   81 mg Oral Daily   docusate sodium   100 mg Oral BID   heparin   5,000 Units Subcutaneous Q8H SCH   Insulin Lispro  4-20 Units Subcutaneous TID with meals   potassium chloride   40 mEq Oral Once   RECENT VITALS:  Blood pressure (!) 142/73, pulse 64, temperature 36.2 C (97.2 F), temperature source Temporal, resp. rate 16, height 1.32 m (4' 3.97), weight 65.3 kg (143 lb 15.4 oz), SpO2 97%. VITAL RANGES:  Temp:  [36.2 C (97.1 F)-36.7 C (98 F)] 36.2 C (97.2 F) Heart Rate:  [62-78] 64 Resp:  [16-24] 16 BP: (122-165)/(50-73) 142/73  No intake or output data in the 24 hours ending 11/17/23 0933 No intake/output data recorded.   ECG: NSR with 1st degree AVB with anteroseptal MI, Old Telemetry monitor observed: NSR with 1st degree AVB.  PHYSICAL EXAM:  Constitutional: Alert, awake, oriented. NAD, Well Developed,   Eyes: PERRLA, EOMI, anicteric sclera HENT: normocephalic, atraumatic. No ears or nose discharge. Neck: supple, no JVD, no lymphadenopathy, no thyromegaly CV: RRR, no 3/6 diastolic murmur apex,, no extra sounds. no edema,  Respiratory: Bibasilar rales Abdo: BS +, soft, nontender, no distention. Musculoskeletal: no clubbing or cyanosis.  Skin: normal temperature, turgor and texture for age. No rash  Neuro: CN?s II-XII grossly intact, non-focal grossly  RESULTS: Lab Results  Component Value Date   WBC 7.0 11/17/2023    HGB 11.2 (L) 11/17/2023   HCT 34.3 (L) 11/17/2023   MCV 85.1 11/17/2023   PLT 270 11/17/2023   Lab Results  Component Value Date   HDL 45 11/17/2023   No results found for: ALT, AST, GGT, ALKPHOS, BILITOT  Lab Results  Component Value Date   CREATININE 0.80 11/17/2023   BUN 16 11/17/2023   NA 142 11/17/2023   K 2.8 (L) 11/17/2023   CL 105 11/17/2023   CO2 28 11/17/2023    No results found.  ACCESSORY DATA:  I reviewed this patient's pertinent available labs, telemetry, EKG, and pertinent radiographic studies reports as well as the physicians notes related to this admission. I spent 59 min examining this patient, reviewing pertinent medical data, discussing with the patient and or family members and formulating this plan of care.  PROBLEM LIST: Problem List[5]  ASSESSMENT / PLAN:  1. Pulmonary edema - ongoing rales on exam. Echo in March, 2025 with severe LVH and moderate mitral stenosis. SBP significantly elevated on presentation to ER >200/100, likely the etiology of flash pulmonary edema. Recommend con't gentle IV diuresis. Stop IVF and replace potassium to maintain >4 and mag >2.   2. Chest pain - improved with toradol . Recent radiation to left chest wall to treat breast cancer. Concern for pleuritis vrs pericarditis vrs coronary artery disease. Troponin neg x 3 sets. EKG with prior infarct. Recommend starting ASA 81 mg PO daily. Con't statin therapy. We will check ESR/CRP and CK levels. Recommend nuclear chemical stress test. Unable to complete today due to lack of availability with the nuclear stress lab. Please keep NPO after MN for stress test tomorrow morning.   3. HTN - BP mildly elevated this morning. Recommend con't losartan /HCTZ and starting coreg 3.125 mg BID for improved diastolic filling time with severe LVH and mod MS. Con't IV lasix and start sprionolactone. HOLD outpatient amlodipine  for now. Con't low sodium diet.  4. LVH -severe on prior echo. We  will repeat an echo and monitor. Additional recs as above.   5. Mitral stenosis - moderate - we will start coreg as above and proceed with echocardiogram.   I appreciate the opportunity to provide cardiovascular care for this patient.  Please call [phone] for any questions or concerns  Harlene Mulch DNP ACNP-BC  11/17/2023 9:33 AM CDT     [1]  Past Medical History: Diagnosis Date   Heart murmur    Hyperlipidemia    Hypertension   [2]  Past Surgical History: Procedure Laterality Date   BREAST LUMPECTOMY Left   [3] History reviewed. No pertinent family history. [4] No Known Allergies [5]  Patient Active Problem List Diagnosis   Atypical chest pain   Electronically signed by Mulch Harlene Dragon, NP at 11/17/2023  1:39 PM CDT

## (undated) NOTE — Unmapped External Note (Signed)
 Formatting of this note might be different from the original.  Problem: Infection Risk Goal: Stabilize- Infection Risk Description: Increased chance of contamination with disease-producing germs. Outcome: Stabilized  Electronically signed by Bevely Setter, RN at 11/18/2023  7:02 AM CDT

## (undated) NOTE — Telephone Encounter (Signed)
 Formatting of this note might be different from the original. MRI of Lumbar spine without shows Severe L4-L5 and moderate to severe L2-3 central canal stenosis due to disc bulging, central disc protrusions, and facet osteoarthritis. Moderate L3-4 central canal stenosis.  Spoke to patient and updated her on results. Encouraged patient t o follow up with primary care so she can obtain a referral to spine surgery since she lives out of town. Patient verbalized understanding and stated she will call.  Electronically signed by Johnita Pleasant Mini, FNP-BC at 02/05/2024 10:09 AM CST

## (undated) NOTE — Discharge Summary (Signed)
 Formatting of this note is different from the original. Images from the original note were not included.   Physician Discharge Summary   Patient ID: [id number]  Admit Date: 11/16/2023  Discharge Date: 11/18/2023 Name: Paige Hester  Age: 53 y.o.  Date of Birth: [birth year]   Attending Physician: Eliane Lawless, MD  Consultants and Other Treatment Team Providers: Treatment Team       Provider   Service Specialty    Eliane Lawless, MD     -- Internal Medicine - Hospitalist    Fabian Sharper, MD     -- Cardiology     Surgeries/Procedures  * No surgery found [facility] Diagnoses: Active Hospital Problems   Principal Problem:  Atypical chest pain  Discharged Condition: good  Hospital Course:  Paige Hester is a pleasant 37 year old female with PMH of HTN, dyslipidemia, heart murmur, right lung PE(diagnosed before her breast cancer diagnosis) and a recent left breast lumpectomy for breast cancer(surgery on March 6) presented to [facility] emergency room complaining of acute onset left-sided chest pain that began approximately 1 hour prior to arrival to the emergency room.  She describes the pain as severe 9 out of 10 pressure-like(sitting on chest) and localized to the left chest, near the site of prior breast surgery.  She reported that a few days ago she had difficulty taking deep breaths, though not true shortness of breath, and today she had mild exertional dyspnea.  The pain was not related to level of activity.she thought it could be from her left breast lumpectomy she had in March for which she received radiation therapy.  She denies any nausea, vomiting, diaphoresis, dizziness, lightheadedness, cough, nasal congestion, sore throat, or leg swelling.  She took 1200 mg of Tylenol  about an hour prior to arrival to the emergency room for pain, with minimal relief.  She has a history of radiation to the left breast. Her workup showed a normal CBC and renal panel  except for a slightly low potassium of 3.4.  Her chest x-ray showed pulmonary vascular congestion; her high-sensitivity troponin was less than 4 x 2 and her BNP was 86. In the emergency room she was treated with Toradol  for pain and Lasix IV. I have consulted Dr. Fabian.  ASSESSMENT  Chest pain Admit the patient to telemetry unit Optimize medical management S/p stress test-small fixed apical defect. ECHO - normal EF, severe LVH,moderate MS  Pulmonary vascular congestion Hypertension Continue home blood pressure medications   Dyslipidemia Continue statin  History of right PE History of left breast cancer Resume home Eliquis   Hypokalemia, acute Replace electrolytes    Discharge Exam:  General Appearance:    Alert, cooperative, no distress  Head:    Normocephalic,  atraumatic  Eyes:    PERRL, conjunctiva/corneas clear, EOM's intact  Throat: Moist mucous membranes  Neck:   Supple,  Lungs:     Clear to auscultation bilaterally, respirations unlabored  Heart:    Regular rate and rhythm, S1 and S2 normal,  Abdomen:     Soft, non tender  Extremities:   Extremities normal, atraumatic, no cyanosis or edema   skin:   Skin color, texture, turgor normal, no rashes or lesions  Neurologic:   CNII-XII intact. Normal strength, sensation and reflexes      throughout   Discharge Vital Signs: Visit Vitals BP 109/54  Pulse 65  Temp 36.3 C (97.4 F) (Temporal)  Resp 16  Ht 1.651 m (5' 5)  Wt 113.4 kg (250 lb)  Body mass index is 41.6 kg/m.  Labs Day of Discharge: Recent Results (from the past 24 hours)  Basic Metabolic Panel   Collection Time: 11/18/23  2:32 AM  Result Value Ref Range   Glucose 90 80 - 115 mg/dL   BUN 15 7 - 21 mg/dL   Creatinine 9.19 9.39 - 1.10 mg/dL   Est Glomerular Filtration Rate 80.21 >=60.00 mL/min/1.22m2   Sodium 140 131 - 143 mmol/L   Potassium 3.4 (L) 3.5 - 5.1 mmol/L   Chloride 104 98 - 107 mmol/L   Carbon Dioxide 26 17 - 31 mmol/L   Anion  Gap 10 (L) 11-17 mmol/L mmol/L   BUN/Creatinine Ratio 19 6 - 20 mg/dL   Osmolality, Calculated 280 275 - 295   Calcium  9.7 7.7 - 10.1 mg/dL  Magnesium   Collection Time: 11/18/23  2:32 AM  Result Value Ref Range   Magnesium 1.8 1.3 - 2.7 mg/dL  Bedside Glucose, POC   Collection Time: 11/18/23  9:35 AM  Result Value Ref Range   Glucose, POC 120 (H) 80 - 115 mg/dL   Specimen Type BLOOD   Bedside Glucose, POC   Collection Time: 11/18/23 11:26 AM  Result Value Ref Range   Glucose, POC 128 (H) 80 - 115 mg/dL   Specimen Type BLOOD   Bedside Glucose, POC   Collection Time: 11/18/23  3:23 PM  Result Value Ref Range   Glucose, POC 112 80 - 115 mg/dL   Specimen Type BLOOD    Un-Resulted Labs: Unresulted Lab (From admission, onward)    None     Disposition: Home  Follow-Up Information:  Patient Instructions and Medications:  Medication List    ASK your doctor about these medications        Prescription Notes Morning Afternoon Evening Bedtime As Needed   amLODIPine  10 MG tablet Commonly known as: Norvasc    Refills: 0  Take 10 mg by mouth daily  10 mg   anastrozole  1 MG tablet Commonly known as: Arimidex    Refills: 0  Take 1 mg by mouth daily  1 mg   apixaban  2.5 MG tablet Commonly known as: Eliquis  Ask about: Which instructions should I use?   Refills: 0  Take 2.5 mg by mouth 2 (two) times daily as needed (Travel)   aspirin  81 mg chewable tablet   Refills: 0  Chew 81 mg by mouth daily  81 mg   calcium  carbonate 500 MG tablet Commonly known as: Oystercal   Refills: 0  Take 600 mg by mouth 2 (two) times daily  600 mg  600 mg   CHOLEcalciferol 25 MCG (1000 UT) tablet Commonly known as: Vitamin D3   Refills: 0  Take 1,000 Units by mouth daily  1,000 Units   loratadine 10 MG dispersible tablet Commonly known as: Claritin Reditabs   Refills: 0  Take 10 mg by mouth daily  10 mg   losartan -hydrochlorothiazide  100-25 MG tablet Commonly  known as: Hyzaar   Refills: 0  Take 1 tablet by mouth daily  1 tablet   omeprazole  20 MG delayed-release capsule Commonly known as: PriLOSEC   Refills: 0  Take 20 mg by mouth daily  20 mg   potassium phosphate 500 MG tablet Commonly known as: K-Phos   Refills: 0  Take 500 mg by mouth 2 (two) times daily  500 mg  500 mg   rosuvastatin  20 MG tablet Commonly known as: Crestor    Refills: 0  Take 20 mg by mouth nightly  20 mg      Risk Factors for Re-Admit: No Risk for Re-Admission  Time involved in Discharge Process: 36 minutes  Colon Shaker, MD   11/18/2023   6:49 PM CDT  Electronically signed by Shaker Colon, MD at 11/18/2023  6:51 PM CDT

---

## 1998-02-21 ENCOUNTER — Ambulatory Visit (HOSPITAL_COMMUNITY): Admission: RE | Admit: 1998-02-21 | Discharge: 1998-02-21 | Payer: Self-pay | Admitting: Internal Medicine

## 1998-02-21 ENCOUNTER — Encounter: Payer: Self-pay | Admitting: Internal Medicine

## 1998-03-02 ENCOUNTER — Emergency Department (HOSPITAL_COMMUNITY): Admission: EM | Admit: 1998-03-02 | Discharge: 1998-03-02 | Payer: Self-pay | Admitting: Emergency Medicine

## 1999-11-29 ENCOUNTER — Emergency Department (HOSPITAL_COMMUNITY): Admission: EM | Admit: 1999-11-29 | Discharge: 1999-11-29 | Payer: Self-pay | Admitting: Emergency Medicine

## 2002-02-22 ENCOUNTER — Other Ambulatory Visit: Admission: RE | Admit: 2002-02-22 | Discharge: 2002-02-22 | Payer: Self-pay | Admitting: *Deleted

## 2004-06-03 ENCOUNTER — Emergency Department: Payer: Self-pay | Admitting: Emergency Medicine

## 2004-10-17 ENCOUNTER — Ambulatory Visit: Payer: Self-pay | Admitting: Internal Medicine

## 2004-12-18 ENCOUNTER — Ambulatory Visit: Payer: Self-pay | Admitting: Internal Medicine

## 2005-03-18 ENCOUNTER — Ambulatory Visit: Payer: Self-pay | Admitting: Internal Medicine

## 2005-03-28 ENCOUNTER — Ambulatory Visit: Payer: Self-pay | Admitting: Endocrinology

## 2005-05-12 ENCOUNTER — Emergency Department: Payer: Self-pay | Admitting: Emergency Medicine

## 2006-05-04 ENCOUNTER — Ambulatory Visit: Payer: Self-pay | Admitting: Internal Medicine

## 2006-12-19 ENCOUNTER — Encounter: Payer: Self-pay | Admitting: *Deleted

## 2006-12-19 DIAGNOSIS — I10 Essential (primary) hypertension: Secondary | ICD-10-CM | POA: Insufficient documentation

## 2007-02-16 ENCOUNTER — Ambulatory Visit: Payer: Self-pay | Admitting: Internal Medicine

## 2007-02-16 DIAGNOSIS — J209 Acute bronchitis, unspecified: Secondary | ICD-10-CM | POA: Insufficient documentation

## 2007-02-16 DIAGNOSIS — L259 Unspecified contact dermatitis, unspecified cause: Secondary | ICD-10-CM | POA: Insufficient documentation

## 2007-02-16 DIAGNOSIS — R498 Other voice and resonance disorders: Secondary | ICD-10-CM | POA: Insufficient documentation

## 2007-02-24 ENCOUNTER — Encounter: Admission: RE | Admit: 2007-02-24 | Discharge: 2007-02-24 | Payer: Self-pay | Admitting: Internal Medicine

## 2007-03-01 ENCOUNTER — Encounter: Admission: RE | Admit: 2007-03-01 | Discharge: 2007-03-01 | Payer: Self-pay | Admitting: Internal Medicine

## 2007-08-20 ENCOUNTER — Ambulatory Visit: Payer: Self-pay | Admitting: Internal Medicine

## 2007-08-20 DIAGNOSIS — R350 Frequency of micturition: Secondary | ICD-10-CM | POA: Insufficient documentation

## 2007-08-20 DIAGNOSIS — N912 Amenorrhea, unspecified: Secondary | ICD-10-CM | POA: Insufficient documentation

## 2007-08-20 DIAGNOSIS — R631 Polydipsia: Secondary | ICD-10-CM | POA: Insufficient documentation

## 2007-08-23 LAB — CONVERTED CEMR LAB
ALT: 12 units/L (ref 0–35)
AST: 17 units/L (ref 0–37)
Albumin: 3.6 g/dL (ref 3.5–5.2)
Alkaline Phosphatase: 84 units/L (ref 39–117)
BUN: 11 mg/dL (ref 6–23)
Bacteria, UA: NEGATIVE
Basophils Absolute: 0.1 10*3/uL (ref 0.0–0.1)
Basophils Relative: 1.1 % — ABNORMAL HIGH (ref 0.0–1.0)
Bilirubin Urine: NEGATIVE
Bilirubin, Direct: 0.1 mg/dL (ref 0.0–0.3)
CO2: 31 meq/L (ref 19–32)
Calcium: 9.4 mg/dL (ref 8.4–10.5)
Chloride: 102 meq/L (ref 96–112)
Cholesterol: 171 mg/dL (ref 0–200)
Creatinine, Ser: 0.8 mg/dL (ref 0.4–1.2)
Crystals: NEGATIVE
Eosinophils Absolute: 0.3 10*3/uL (ref 0.0–0.7)
Eosinophils Relative: 5.6 % — ABNORMAL HIGH (ref 0.0–5.0)
FSH: 45.2 milliintl units/mL
GFR calc Af Amer: 97 mL/min
GFR calc non Af Amer: 80 mL/min
Glucose, Bld: 99 mg/dL (ref 70–99)
HCT: 38.4 % (ref 36.0–46.0)
HDL: 41.2 mg/dL (ref >39.0)
Hemoglobin, Urine: NEGATIVE
Hemoglobin: 13.2 g/dL (ref 12.0–15.0)
Ketones, ur: NEGATIVE mg/dL
LDL Cholesterol: 120 mg/dL — ABNORMAL HIGH (ref 0–99)
Lymphocytes Relative: 38.9 % (ref 12.0–46.0)
MCHC: 34.5 g/dL (ref 30.0–36.0)
MCV: 84.5 fL (ref 78.0–100.0)
Monocytes Absolute: 0.5 10*3/uL (ref 0.1–1.0)
Monocytes Relative: 8.4 % (ref 3.0–12.0)
Mucus, UA: NEGATIVE
Neutro Abs: 2.6 10*3/uL (ref 1.4–7.7)
Neutrophils Relative %: 46 % (ref 43.0–77.0)
Nitrite: NEGATIVE
Platelets: 308 10*3/uL (ref 150–400)
Potassium: 3.6 meq/L (ref 3.5–5.1)
RBC / HPF: NONE SEEN
RBC: 4.54 M/uL (ref 3.87–5.11)
RDW: 13.4 % (ref 11.5–14.6)
Sodium: 141 meq/L (ref 135–145)
Specific Gravity, Urine: <=1.005 (ref 1.000–1.03)
TSH: 1.31 microintl units/mL (ref 0.35–5.50)
Total Bilirubin: 0.9 mg/dL (ref 0.3–1.2)
Total CHOL/HDL Ratio: 4.2
Total Protein, Urine: NEGATIVE mg/dL
Total Protein: 7.2 g/dL (ref 6.0–8.3)
Triglycerides: 48 mg/dL (ref 0–149)
Urine Glucose: NEGATIVE mg/dL
Urobilinogen, UA: 0.2 (ref 0.0–1.0)
VLDL: 10 mg/dL (ref 0–40)
WBC: 5.7 10*3/uL (ref 4.5–10.5)
pH: 6.5 (ref 5.0–8.0)

## 2008-02-05 ENCOUNTER — Ambulatory Visit: Payer: Self-pay | Admitting: Family Medicine

## 2008-02-05 DIAGNOSIS — J069 Acute upper respiratory infection, unspecified: Secondary | ICD-10-CM | POA: Insufficient documentation

## 2008-02-22 ENCOUNTER — Ambulatory Visit: Payer: Self-pay | Admitting: Internal Medicine

## 2008-03-17 HISTORY — PX: COLONOSCOPY: SHX174

## 2008-03-21 ENCOUNTER — Ambulatory Visit: Payer: Self-pay | Admitting: Internal Medicine

## 2008-04-04 ENCOUNTER — Encounter: Payer: Self-pay | Admitting: Internal Medicine

## 2008-04-04 ENCOUNTER — Ambulatory Visit: Payer: Self-pay | Admitting: Internal Medicine

## 2008-04-04 LAB — HM COLONOSCOPY

## 2008-04-05 ENCOUNTER — Encounter: Payer: Self-pay | Admitting: Internal Medicine

## 2008-05-23 ENCOUNTER — Telehealth: Payer: Self-pay | Admitting: Internal Medicine

## 2008-06-22 ENCOUNTER — Telehealth: Payer: Self-pay | Admitting: Internal Medicine

## 2008-08-23 ENCOUNTER — Telehealth: Payer: Self-pay | Admitting: Internal Medicine

## 2008-08-28 ENCOUNTER — Ambulatory Visit: Payer: Self-pay | Admitting: Internal Medicine

## 2008-08-28 DIAGNOSIS — D509 Iron deficiency anemia, unspecified: Secondary | ICD-10-CM | POA: Insufficient documentation

## 2008-09-13 ENCOUNTER — Ambulatory Visit: Payer: Self-pay | Admitting: Internal Medicine

## 2008-09-13 LAB — CONVERTED CEMR LAB
ALT: 11 units/L (ref 0–35)
AST: 17 units/L (ref 0–37)
Albumin: 3.5 g/dL (ref 3.5–5.2)
Alkaline Phosphatase: 69 units/L (ref 39–117)
BUN: 13 mg/dL (ref 6–23)
Basophils Absolute: 0 10*3/uL (ref 0.0–0.1)
Basophils Relative: 0.2 % (ref 0.0–3.0)
Bilirubin, Direct: 0.1 mg/dL (ref 0.0–0.3)
CO2: 32 meq/L (ref 19–32)
Calcium: 9.2 mg/dL (ref 8.4–10.5)
Chloride: 103 meq/L (ref 96–112)
Cholesterol: 179 mg/dL (ref 0–200)
Creatinine, Ser: 0.7 mg/dL (ref 0.4–1.2)
Eosinophils Absolute: 0.3 10*3/uL (ref 0.0–0.7)
Eosinophils Relative: 4.2 % (ref 0.0–5.0)
GFR calc non Af Amer: 112.29 mL/min (ref >60)
Glucose, Bld: 94 mg/dL (ref 70–99)
HCT: 35.4 % — ABNORMAL LOW (ref 36.0–46.0)
HDL: 56.4 mg/dL (ref >39.00)
Hemoglobin: 12.1 g/dL (ref 12.0–15.0)
LDL Cholesterol: 113 mg/dL — ABNORMAL HIGH (ref 0–99)
Lymphocytes Relative: 47.4 % — ABNORMAL HIGH (ref 12.0–46.0)
Lymphs Abs: 3.4 10*3/uL (ref 0.7–4.0)
MCHC: 34.2 g/dL (ref 30.0–36.0)
MCV: 82.2 fL (ref 78.0–100.0)
Monocytes Absolute: 0.6 10*3/uL (ref 0.1–1.0)
Monocytes Relative: 7.9 % (ref 3.0–12.0)
Neutro Abs: 2.9 10*3/uL (ref 1.4–7.7)
Neutrophils Relative %: 40.3 % — ABNORMAL LOW (ref 43.0–77.0)
Platelets: 293 10*3/uL (ref 150.0–400.0)
Potassium: 3.5 meq/L (ref 3.5–5.1)
RBC: 4.31 M/uL (ref 3.87–5.11)
RDW: 13.3 % (ref 11.5–14.6)
Sodium: 141 meq/L (ref 135–145)
TSH: 1.16 microintl units/mL (ref 0.35–5.50)
Total Bilirubin: 0.7 mg/dL (ref 0.3–1.2)
Total CHOL/HDL Ratio: 3
Total Protein: 7 g/dL (ref 6.0–8.3)
Triglycerides: 50 mg/dL (ref 0.0–149.0)
VLDL: 10 mg/dL (ref 0.0–40.0)
Vit D, 25-Hydroxy: 26 ng/mL — ABNORMAL LOW (ref 30–89)
WBC: 7.2 10*3/uL (ref 4.5–10.5)

## 2008-09-20 ENCOUNTER — Ambulatory Visit: Payer: Self-pay | Admitting: Internal Medicine

## 2008-09-20 DIAGNOSIS — R011 Cardiac murmur, unspecified: Secondary | ICD-10-CM | POA: Insufficient documentation

## 2008-09-27 ENCOUNTER — Encounter: Payer: Self-pay | Admitting: Internal Medicine

## 2008-09-27 ENCOUNTER — Ambulatory Visit: Payer: Self-pay

## 2009-02-26 ENCOUNTER — Telehealth: Payer: Self-pay | Admitting: Internal Medicine

## 2009-03-20 ENCOUNTER — Ambulatory Visit: Payer: Self-pay | Admitting: Internal Medicine

## 2009-03-20 DIAGNOSIS — J029 Acute pharyngitis, unspecified: Secondary | ICD-10-CM | POA: Insufficient documentation

## 2009-08-28 ENCOUNTER — Telehealth: Payer: Self-pay | Admitting: Internal Medicine

## 2009-12-21 ENCOUNTER — Telehealth: Payer: Self-pay | Admitting: Internal Medicine

## 2010-01-03 ENCOUNTER — Ambulatory Visit: Payer: Self-pay | Admitting: Internal Medicine

## 2010-01-03 LAB — CONVERTED CEMR LAB
ALT: 15 units/L (ref 0–35)
AST: 21 units/L (ref 0–37)
Albumin: 3.8 g/dL (ref 3.5–5.2)
Alkaline Phosphatase: 73 units/L (ref 39–117)
BUN: 9 mg/dL (ref 6–23)
Basophils Absolute: 0 10*3/uL (ref 0.0–0.1)
Basophils Relative: 0.5 % (ref 0.0–3.0)
Bilirubin Urine: NEGATIVE
Bilirubin, Direct: 0.1 mg/dL (ref 0.0–0.3)
CO2: 30 meq/L (ref 19–32)
Calcium: 9.6 mg/dL (ref 8.4–10.5)
Chloride: 102 meq/L (ref 96–112)
Cholesterol: 189 mg/dL (ref 0–200)
Creatinine, Ser: 0.8 mg/dL (ref 0.4–1.2)
Eosinophils Absolute: 0.3 10*3/uL (ref 0.0–0.7)
Eosinophils Relative: 4.5 % (ref 0.0–5.0)
GFR calc non Af Amer: 93.1 mL/min (ref >60)
Glucose, Bld: 81 mg/dL (ref 70–99)
HCT: 36 % (ref 36.0–46.0)
HDL: 62.6 mg/dL (ref >39.00)
Hemoglobin: 12.1 g/dL (ref 12.0–15.0)
Ketones, ur: NEGATIVE mg/dL
LDL Cholesterol: 116 mg/dL — ABNORMAL HIGH (ref 0–99)
Lymphocytes Relative: 23.4 % (ref 12.0–46.0)
Lymphs Abs: 1.7 10*3/uL (ref 0.7–4.0)
MCHC: 33.8 g/dL (ref 30.0–36.0)
MCV: 82.1 fL (ref 78.0–100.0)
Monocytes Absolute: 0.7 10*3/uL (ref 0.1–1.0)
Monocytes Relative: 9.3 % (ref 3.0–12.0)
Neutro Abs: 4.4 10*3/uL (ref 1.4–7.7)
Neutrophils Relative %: 62.3 % (ref 43.0–77.0)
Nitrite: NEGATIVE
Platelets: 316 10*3/uL (ref 150.0–400.0)
Potassium: 4 meq/L (ref 3.5–5.1)
RBC: 4.38 M/uL (ref 3.87–5.11)
RDW: 14.1 % (ref 11.5–14.6)
Sodium: 141 meq/L (ref 135–145)
Specific Gravity, Urine: <=1.005 (ref 1.000–1.030)
TSH: 1.09 microintl units/mL (ref 0.35–5.50)
Total Bilirubin: 0.6 mg/dL (ref 0.3–1.2)
Total CHOL/HDL Ratio: 3
Total Protein, Urine: NEGATIVE mg/dL
Total Protein: 7.2 g/dL (ref 6.0–8.3)
Triglycerides: 52 mg/dL (ref 0.0–149.0)
Urine Glucose: NEGATIVE mg/dL
Urobilinogen, UA: 0.2 (ref 0.0–1.0)
VLDL: 10.4 mg/dL (ref 0.0–40.0)
WBC: 7.1 10*3/uL (ref 4.5–10.5)
pH: 6.5 (ref 5.0–8.0)

## 2010-01-04 ENCOUNTER — Ambulatory Visit: Payer: Self-pay | Admitting: Internal Medicine

## 2010-01-08 ENCOUNTER — Ambulatory Visit: Payer: Self-pay | Admitting: Internal Medicine

## 2010-01-08 ENCOUNTER — Encounter: Payer: Self-pay | Admitting: Internal Medicine

## 2010-03-17 DIAGNOSIS — K824 Cholesterolosis of gallbladder: Secondary | ICD-10-CM

## 2010-03-17 HISTORY — DX: Cholesterolosis of gallbladder: K82.4

## 2010-04-16 NOTE — Assessment & Plan Note (Signed)
Summary: CPX/#/cd - [employer]   Vital Signs:  Patient profile:   55 year old female Height:      65 inches Weight:      246 pounds BMI:     41.08 Temp:     98.6 degrees F oral Pulse rate:   80 / minute Pulse rhythm:   regular Resp:     16 per minute BP sitting:   140 / 76  (left arm) Cuff size:   large  Vitals Entered By: Lanier Prude, Beverly Gust) (January 08, 2010 3:06 PM) CC: CPX Is Patient Diabetic? No   Primary Care Provider:  Tresa Garter MD  CC:  CPX.  History of Present Illness: The patient presents for a preventive health examination   Current Medications (verified): 1)  Verapamil Hcl Cr 240 Mg  Cp24 (Verapamil Hcl) .... Take 1 By Mouth Qd 2)  Vitamin D3 1000 Unit  Tabs (Cholecalciferol) .Marland Kitchen.. 1 By Mouth Daily 3)  Benazepril-Hydrochlorothiazide 20-12.5 Mg Tabs (Benazepril-Hydrochlorothiazide) .... 2 By Mouth Qam 4)  Loratadine 10 Mg  Tabs (Loratadine) .... Once Daily As Needed Allergies 5)  Fe-Caps 250 Mg Cr-Caps (Ferrous Sulfate) .Marland Kitchen.. 1 By Mouth Qd 6)  Azithromycin 250 Mg Tabs (Azithromycin) .... 2 Tabs By Mouth Today, Then 1 By Mouth Daily Starting Tomorrow 7)  Cheratussin Ac 100-10 Mg/26ml Syrp (Guaifenesin-Codeine) .... 5cc By Mouth Two Times A Day As Needed For Cough Symptoms  Allergies (verified): No Known Drug Allergies  Past History:  Past Medical History: Last updated: 01/04/2010 Hypertension Menopausal since 2007 Colon 2010  -Dr Arta Bruce D def  Family History: Last updated: 09/20/2008 Family History Hypertension Family History of Colon CA 1st degree relative <60 F Family History of Prostate CA 1st degree relative <50 M MI at 62  Social History: Last updated: 02/16/2007 Occupation: Married Former Smoker  1995  Past Surgical History: Denies surgical history  Review of Systems  The patient denies anorexia, fever, weight loss, weight gain, vision loss, decreased hearing, hoarseness, chest pain, syncope, dyspnea on exertion, peripheral  edema, prolonged cough, headaches, hemoptysis, abdominal pain, melena, hematochezia, severe indigestion/heartburn, hematuria, incontinence, genital sores, muscle weakness, suspicious skin lesions, transient blindness, difficulty walking, depression, unusual weight change, abnormal bleeding, enlarged lymph nodes, angioedema, and breast masses.    Physical Exam  General:  alert, well-developed, well-nourished, and cooperative to examination.   mildly ill, nasal congestion in voice Head:  Normocephalic and atraumatic without obvious abnormalities. No apparent alopecia or balding. Eyes:  No corneal or conjunctival inflammation noted. EOMI. Perrla. Funduscopic exam benign, without hemorrhages, exudates or papilledema. Vision grossly normal. Ears:  External ear exam shows no significant lesions or deformities.  Otoscopic examination reveals clear canals, tympanic membranes are intact bilaterally without bulging, retraction, inflammation or discharge. Hearing is grossly normal bilaterally. Nose:  External nasal examination shows no deformity or inflammation. Nasal mucosa are pink and moist without lesions or exudates. Mouth:  Erythematous throat mucosa and intranasal erythema.  Neck:  no carotid bruit or thyromegaly no cervical or supraclavicular lymphadenopathy  Lungs:  normal respiratory effort, no intercostal retractions or use of accessory muscles; breath sounds with bilaterally few rhonchi but no crackles and no wheezes.    Heart:  normal rate, regular rhythm, no murmur, and no rub. BLE without edema. Abdomen:  Bowel sounds positive,abdomen soft and non-tender without masses, organomegaly or hernias noted. Msk:  No deformity or scoliosis noted of thoracic or lumbar spine.   Pulses:  R and L carotid,radial,femoral,dorsalis pedis and posterior tibial  pulses are full and equal bilaterally Extremities:  No clubbing, cyanosis, edema, or deformity noted with normal full range of motion of all joints.     Neurologic:  No cranial nerve deficits noted. Station and gait are normal. Plantar reflexes are down-going bilaterally. DTRs are symmetrical throughout. Sensory, motor and coordinative functions appear intact. Skin:  Intact without suspicious lesions or rashes Cervical Nodes:  No lymphadenopathy noted Inguinal Nodes:  No significant adenopathy Psych:  Cognition and judgment appear intact. Alert and cooperative with normal attention span and concentration. No apparent delusions, illusions, hallucinations   Impression & Recommendations:  Problem # 1:  Preventive Health Care (ICD-V70.0) Assessment New Health and age related issues were discussed. Available screening tests and vaccinations were discussed as well. Healthy life style including good diet and exercise was discussed.  The labs were reviewed with the patient.  PAP, mammo, colon discussed  Problem # 2:  ANEMIA, IRON DEF, NOS (ICD-280.9) Assessment: Unchanged  Her updated medication list for this problem includes:    Fe-caps 250 Mg Cr-caps (Ferrous sulfate) .Marland Kitchen... 1 by mouth qd  Hgb: 12.1 (01/03/2010)   Hct: 36.0 (01/03/2010)   Platelets: 316.0 (01/03/2010) RBC: 4.38 (01/03/2010)   RDW: 14.1 (01/03/2010)   WBC: 7.1 (01/03/2010) MCV: 82.1 (01/03/2010)   MCHC: 33.8 (01/03/2010) TSH: 1.09 (01/03/2010)  Complete Medication List: 1)  Verapamil Hcl Cr 240 Mg Cp24 (Verapamil hcl) .... Take 1 by mouth qd 2)  Vitamin D3 1000 Unit Tabs (Cholecalciferol) .Marland Kitchen.. 1 by mouth daily 3)  Benazepril-hydrochlorothiazide 20-12.5 Mg Tabs (Benazepril-hydrochlorothiazide) .... 2 by mouth qam 4)  Loratadine 10 Mg Tabs (Loratadine) .... Once daily as needed allergies 5)  Fe-caps 250 Mg Cr-caps (Ferrous sulfate) .Marland Kitchen.. 1 by mouth qd 6)  Cheratussin Ac 100-10 Mg/25ml Syrp (Guaifenesin-codeine) .... 5cc by mouth two times a day as needed for cough symptoms  Other Orders: EKG w/ Interpretation (93000)  Patient Instructions: 1)  Please schedule a follow-up  appointment in 6 months. Prescriptions: BENAZEPRIL-HYDROCHLOROTHIAZIDE 20-12.5 MG TABS (BENAZEPRIL-HYDROCHLOROTHIAZIDE) 2 by mouth qam  #180 x 3   Entered and Authorized by:   Tresa Garter MD   Signed by:   Tresa Garter MD on 01/08/2010   Method used:   Print then Give to Patient   RxID:   [id number] VERAPAMIL HCL CR 240 MG  CP24 (VERAPAMIL HCL) take 1 by mouth qd  #90 x 3   Entered and Authorized by:   Tresa Garter MD   Signed by:   Tresa Garter MD on 01/08/2010   Method used:   Print then Give to Patient   RxID:   [id number]    Orders Added: 1)  EKG w/ Interpretation [93000] 2)  Est. Patient 40-64 years [84696]

## 2010-04-16 NOTE — Progress Notes (Signed)
----   Converted from flag ---- ---- 12/21/2009 10:35 AM, Verdell Face wrote: appt sched for 10/25  ---- 12/18/2009 10:16 AM, Lanier Prude, CMA(AAMA) wrote: Please sched pt for CPX.  She was last seen 09/2008 and was due back 09/2009. ------------------------------

## 2010-04-16 NOTE — Assessment & Plan Note (Signed)
Summary: SORE THROAT/CD   Vital Signs:  Patient profile:   55 year old female Weight:      250 pounds Temp:     98.3 degrees F oral Pulse rate:   68 / minute BP sitting:   152 / 80  (left arm)  Vitals Entered By: Tora Perches (March 20, 2009 5:05 PM) CC: sore throat Is Patient Diabetic? No   CC:  sore throat.  History of Present Illness: The patient presents with complaints of sore throat x 2 d, . Not better with OTC meds..   Current Medications (verified): 1)  Verapamil Hcl Cr 240 Mg  Cp24 (Verapamil Hcl) .... Take 1 By Mouth Qd 2)  Vitamin D3 1000 Unit  Tabs (Cholecalciferol) .Marland Kitchen.. 1 By Mouth Daily 3)  Benazepril-Hydrochlorothiazide 20-12.5 Mg Tabs (Benazepril-Hydrochlorothiazide) .... 2 By Mouth Qam 4)  Loratadine 10 Mg  Tabs (Loratadine) .... Once Daily As Needed Allergies 5)  Fe-Caps 250 Mg Cr-Caps (Ferrous Sulfate) .Marland Kitchen.. 1 By Mouth Qd  Allergies (verified): No Known Drug Allergies  Past History:  Past Medical History: Last updated: 09/20/2008 Hypertension Menopausal since 2007 Colon 2010 Dr Arta Bruce D def  Social History: Last updated: 02/16/2007 Occupation: Married Former Smoker  1995  Physical Exam  General:  overweight-appearing.   Mouth:  Erythematous throat mucosa and intranasal erythema.  Lungs:  CTA Heart:  RRR Abdomen:  Bowel sounds positive,abdomen soft and non-tender without masses, organomegaly or hernias noted. Skin:  Intact without suspicious lesions or rashes   Impression & Recommendations:  Problem # 1:  PHARYNGITIS, ACUTE (ICD-462) Assessment New  Her updated medication list for this problem includes:    Zithromax Z-pak 250 Mg Tabs (Azithromycin) .Marland Kitchen... As dirrected if worse  Problem # 2:  HYPERTENSION (ICD-401.9) Assessment: Comment Only Call if BP is up Her updated medication list for this problem includes:    Verapamil Hcl Cr 240 Mg Cp24 (Verapamil hcl) .Marland Kitchen... Take 1 by mouth qd    Benazepril-hydrochlorothiazide 20-12.5  Mg Tabs (Benazepril-hydrochlorothiazide) .Marland Kitchen... 2 by mouth qam  Complete Medication List: 1)  Verapamil Hcl Cr 240 Mg Cp24 (Verapamil hcl) .... Take 1 by mouth qd 2)  Vitamin D3 1000 Unit Tabs (Cholecalciferol) .Marland Kitchen.. 1 by mouth daily 3)  Benazepril-hydrochlorothiazide 20-12.5 Mg Tabs (Benazepril-hydrochlorothiazide) .... 2 by mouth qam 4)  Loratadine 10 Mg Tabs (Loratadine) .... Once daily as needed allergies 5)  Fe-caps 250 Mg Cr-caps (Ferrous sulfate) .Marland Kitchen.. 1 by mouth qd 6)  Zithromax Z-pak 250 Mg Tabs (Azithromycin) .... As dirrected  Patient Instructions: 1)  Call if you are not better in a reasonable amount of time or if worse. Nl BP<135/85 Prescriptions: ZITHROMAX Z-PAK 250 MG TABS (AZITHROMYCIN) as dirrected  #1 x 0   Entered and Authorized by:   Tresa Garter MD   Signed by:   Tresa Garter MD on 03/20/2009   Method used:   Electronically to        [facility] (retail)       [address], [address]       [place], [place]  [address]       Ph: [phone]       Fax: [phone]   RxID:   [id number]

## 2010-04-16 NOTE — Assessment & Plan Note (Signed)
Summary: DR AVP PT/NO CLINIC CHEST CONGESTION STC   Vital Signs:  Patient profile:   55 year old female Height:      65 inches (165.10 cm) Weight:      247 pounds (112.27 kg) BMI:     41.25 O2 Sat:      97 % on Room air Temp:     98.7 degrees F (37.06 degrees C) oral Pulse rate:   74 / minute BP sitting:   160 / 80  (left arm) Cuff size:   large  Vitals Entered By: Alysia Penna (January 04, 2010 8:34 AM)  Nutrition Counseling: Patient's BMI is greater than 25 and therefore counseled on weight management options.  O2 Flow:  Room air CC: pt c/o /o/ congestion & wheezing. / cp sma, URI symptoms   Primary Care Provider:  Georgina Quint Plotnikov MD  CC:  pt c/o /o/ congestion & wheezing. / cp sma and URI symptoms.  History of Present Illness:  URI Symptoms      This is a 56 year old woman who presents with URI symptoms.  The symptoms began 1 week ago.  The severity is described as moderate.  The patient reports nasal congestion, sore throat, and productive cough, but denies purulent nasal discharge and earache.  Associated symptoms include wheezing.  The patient denies fever, dyspnea, vomiting, and diarrhea.  The patient also reports itchy throat and muscle aches.  The patient denies sneezing, seasonal symptoms, response to antihistamine, and headache.  The patient denies the following risk factors for Strep sinusitis: double sickening, Strep exposure, and tender adenopathy.    Current Medications (verified): 1)  Verapamil Hcl Cr 240 Mg  Cp24 (Verapamil Hcl) .... Take 1 By Mouth Qd 2)  Vitamin D3 1000 Unit  Tabs (Cholecalciferol) .Marland Kitchen.. 1 By Mouth Daily 3)  Benazepril-Hydrochlorothiazide 20-12.5 Mg Tabs (Benazepril-Hydrochlorothiazide) .... 2 By Mouth Qam 4)  Loratadine 10 Mg  Tabs (Loratadine) .... Once Daily As Needed Allergies 5)  Fe-Caps 250 Mg Cr-Caps (Ferrous Sulfate) .Marland Kitchen.. 1 By Mouth Qd  Allergies (verified): No Known Drug Allergies  Past History:  Past Medical  History: Hypertension Menopausal since 2007 Colon 2010  -Dr Arta Bruce D def  Review of Systems  The patient denies decreased hearing, syncope, peripheral edema, and hemoptysis.    Physical Exam  General:  alert, well-developed, well-nourished, and cooperative to examination.   mildly ill, nasal congestion in voice Lungs:  normal respiratory effort, no intercostal retractions or use of accessory muscles; breath sounds with bilaterally few rhonchi but no crackles and no wheezes.    Heart:  normal rate, regular rhythm, no murmur, and no rub. BLE without edema.   Impression & Recommendations:  Problem # 1:  BRONCHITIS, ACUTE (ICD-466.0)  Her updated medication list for this problem includes:    Azithromycin 250 Mg Tabs (Azithromycin) .Marland Kitchen... 2 tabs by mouth today, then 1 by mouth daily starting tomorrow    Cheratussin Ac 100-10 Mg/13ml Syrp (Guaifenesin-codeine) .Marland Kitchen... 5cc by mouth two times a day as needed for cough symptoms  Take antibiotics and other medications as directed. Encouraged to push clear liquids, get enough rest, and take acetaminophen as needed. To be seen in 5-7 days if no improvement, sooner if worse.  Complete Medication List: 1)  Verapamil Hcl Cr 240 Mg Cp24 (Verapamil hcl) .... Take 1 by mouth qd 2)  Vitamin D3 1000 Unit Tabs (Cholecalciferol) .Marland Kitchen.. 1 by mouth daily 3)  Benazepril-hydrochlorothiazide 20-12.5 Mg Tabs (Benazepril-hydrochlorothiazide) .... 2 by mouth qam  4)  Loratadine 10 Mg Tabs (Loratadine) .... Once daily as needed allergies 5)  Fe-caps 250 Mg Cr-caps (Ferrous sulfate) .Marland Kitchen.. 1 by mouth qd 6)  Azithromycin 250 Mg Tabs (Azithromycin) .... 2 tabs by mouth today, then 1 by mouth daily starting tomorrow 7)  Cheratussin Ac 100-10 Mg/78ml Syrp (Guaifenesin-codeine) .... 5cc by mouth two times a day as needed for cough symptoms  Patient Instructions: 1)  it was good to see you today. 2)  Zpack antibiotics and prescription cough syrup - your prescriptions have  been given to you to submit to your pharmacy. Please take as directed. Contact our office if you believe you're having problems with the medication(s).  3)  Get plenty of rest, drink lots of clear liquids, and use Tylenol or Ibuprofen for fever and comfort. Return in 7-10 days if you're not better:sooner if you're feeling worse. Prescriptions: CHERATUSSIN AC 100-10 MG/5ML SYRP (GUAIFENESIN-CODEINE) 5cc by mouth two times a day as needed for cough symptoms  #6 oz x 0   Entered and Authorized by:   Newt Lukes MD   Signed by:   Newt Lukes MD on 01/04/2010   Method used:   Print then Give to Patient   RxID:   [id number] AZITHROMYCIN 250 MG TABS (AZITHROMYCIN) 2 tabs by mouth today, then 1 by mouth daily starting tomorrow  #6 x 0   Entered and Authorized by:   Newt Lukes MD   Signed by:   Newt Lukes MD on 01/04/2010   Method used:   Print then Give to Patient   RxID:   [id number]    Orders Added: 1)  Est. Patient Level IV [84696]

## 2010-04-16 NOTE — Progress Notes (Signed)
  Phone Note Call from Patient Call back at Home Phone [phone]   Caller: Patient Summary of Call: Patient called requesting to know this she has a diagnosis of carpal tunnel in her chart. She would like to know because she is considering surgery. Please advise, I did not see this in EMR  Follow-up for Phone Call        I don't see it... Follow-up by: Tresa Garter MD,  August 28, 2009 5:26 PM  Additional Follow-up for Phone Call Additional follow up Details #1::        Returned call to patient andadvised, no dx of carpal tunnel in chart. lm on her private vn.Marland KitchenMarland KitchenAlvy Beal Archie CMA  August 29, 2009 11:03 AM

## 2010-07-02 ENCOUNTER — Ambulatory Visit (INDEPENDENT_AMBULATORY_CARE_PROVIDER_SITE_OTHER): Payer: BLUE CROSS/BLUE SHIELD | Admitting: Internal Medicine

## 2010-07-02 DIAGNOSIS — J069 Acute upper respiratory infection, unspecified: Secondary | ICD-10-CM

## 2010-07-02 DIAGNOSIS — R1310 Dysphagia, unspecified: Secondary | ICD-10-CM | POA: Insufficient documentation

## 2010-07-02 DIAGNOSIS — R591 Generalized enlarged lymph nodes: Secondary | ICD-10-CM | POA: Insufficient documentation

## 2010-07-02 DIAGNOSIS — G5620 Lesion of ulnar nerve, unspecified upper limb: Secondary | ICD-10-CM | POA: Insufficient documentation

## 2010-07-02 MED ORDER — AZITHROMYCIN 250 MG PO TABS: ORAL_TABLET | ORAL | Status: AC

## 2010-07-12 ENCOUNTER — Other Ambulatory Visit: Payer: Self-pay | Admitting: Internal Medicine

## 2010-07-12 NOTE — Telephone Encounter (Signed)
Patient informed. 

## 2010-09-05 ENCOUNTER — Encounter: Payer: Self-pay | Admitting: Internal Medicine

## 2010-09-05 NOTE — Assessment & Plan Note (Signed)
See Meds 

## 2010-09-05 NOTE — Progress Notes (Signed)
  Subjective:    Patient ID: Paige Hester, female    DOB: 07-Jan-1956, 55 y.o.   MRN: [id number]  HPI  C/o ST x several days, mild cough Review of Systems  Constitutional: Negative for fever and fatigue.  HENT: Positive for sore throat and neck pain.   Respiratory: Negative for cough.   Gastrointestinal: Negative for diarrhea.  Genitourinary: Negative for urgency.  Skin: Negative for rash.       Objective:   Physical Exam  Constitutional: She appears well-developed and well-nourished. No distress.  HENT:  Head: Normocephalic.  Right Ear: External ear normal.  Left Ear: External ear normal.  Nose: Nose normal.  Mouth/Throat: Oropharynx is clear and moist.  Eyes: Conjunctivae are normal. Pupils are equal, round, and reactive to light. Right eye exhibits no discharge. Left eye exhibits no discharge (eryth throat).  Neck: Normal range of motion. Neck supple. No JVD present. No tracheal deviation present. No thyromegaly present.  Cardiovascular: Normal rate, regular rhythm and normal heart sounds.   Pulmonary/Chest: No stridor. No respiratory distress. She has no wheezes.  Abdominal: Soft. Bowel sounds are normal. She exhibits no distension and no mass. There is no tenderness. There is no rebound and no guarding.  Musculoskeletal: She exhibits no edema and no tenderness.  Lymphadenopathy:    She has no cervical adenopathy.  Neurological: She displays normal reflexes. No cranial nerve deficit. She exhibits normal muscle tone. Coordination normal.  Skin: No rash noted. No erythema.  Psychiatric: She has a normal mood and affect. Her behavior is normal. Judgment and thought content normal.          Assessment & Plan:

## 2010-12-19 ENCOUNTER — Other Ambulatory Visit: Payer: Self-pay | Admitting: Internal Medicine

## 2011-01-15 ENCOUNTER — Other Ambulatory Visit (INDEPENDENT_AMBULATORY_CARE_PROVIDER_SITE_OTHER): Payer: BLUE CROSS/BLUE SHIELD

## 2011-01-15 ENCOUNTER — Telehealth: Payer: Self-pay | Admitting: *Deleted

## 2011-01-15 ENCOUNTER — Encounter: Payer: Self-pay | Admitting: Internal Medicine

## 2011-01-15 ENCOUNTER — Ambulatory Visit (INDEPENDENT_AMBULATORY_CARE_PROVIDER_SITE_OTHER): Payer: BLUE CROSS/BLUE SHIELD | Admitting: Internal Medicine

## 2011-01-15 ENCOUNTER — Other Ambulatory Visit: Payer: Self-pay | Admitting: Internal Medicine

## 2011-01-15 VITALS — BP 162/88 | HR 80 | Temp 98.5°F | Resp 16 | Wt 243.0 lb

## 2011-01-15 DIAGNOSIS — I10 Essential (primary) hypertension: Secondary | ICD-10-CM

## 2011-01-15 DIAGNOSIS — R1011 Right upper quadrant pain: Secondary | ICD-10-CM

## 2011-01-15 DIAGNOSIS — R112 Nausea with vomiting, unspecified: Secondary | ICD-10-CM

## 2011-01-15 DIAGNOSIS — E876 Hypokalemia: Secondary | ICD-10-CM

## 2011-01-15 LAB — URINALYSIS, ROUTINE W REFLEX MICROSCOPIC
Nitrite: NEGATIVE
Total Protein, Urine: NEGATIVE
Urine Glucose: NEGATIVE
pH: 6 (ref 5.0–8.0)

## 2011-01-15 LAB — CBC WITH DIFFERENTIAL/PLATELET
Basophils Absolute: 0.1 10*3/uL (ref 0.0–0.1)
Eosinophils Relative: 1.2 % (ref 0.0–5.0)
HCT: 35.8 % — ABNORMAL LOW (ref 36.0–46.0)
Lymphs Abs: 2.1 10*3/uL (ref 0.7–4.0)
Monocytes Relative: 7.6 % (ref 3.0–12.0)
Neutrophils Relative %: 73.2 % (ref 43.0–77.0)
Platelets: 369 10*3/uL (ref 150.0–400.0)
RDW: 14.2 % (ref 11.5–14.6)
WBC: 11.9 10*3/uL — ABNORMAL HIGH (ref 4.5–10.5)

## 2011-01-15 LAB — HEPATIC FUNCTION PANEL
AST: 19 U/L (ref 0–37)
Alkaline Phosphatase: 70 U/L (ref 39–117)
Bilirubin, Direct: 0.1 mg/dL (ref 0.0–0.3)

## 2011-01-15 LAB — LIPASE: Lipase: 107 U/L — ABNORMAL HIGH (ref 11.0–59.0)

## 2011-01-15 LAB — BASIC METABOLIC PANEL
CO2: 28 mEq/L (ref 19–32)
Calcium: 9.3 mg/dL (ref 8.4–10.5)
GFR: 92.75 mL/min (ref >60.00)
Glucose, Bld: 115 mg/dL — ABNORMAL HIGH (ref 70–99)
Potassium: 2.6 mEq/L — CL (ref 3.5–5.1)
Sodium: 137 mEq/L (ref 135–145)

## 2011-01-15 MED ORDER — MEPERIDINE HCL 50 MG/ML IJ SOLN
50.0000 mg | Freq: Once | INTRAMUSCULAR | Status: AC
  Administered 2011-01-15: 50 mg via INTRAMUSCULAR

## 2011-01-15 MED ORDER — PROMETHAZINE HCL 50 MG/ML IJ SOLN
50.0000 mg | Freq: Once | INTRAMUSCULAR | Status: AC
  Administered 2011-01-15: 50 mg via INTRAMUSCULAR

## 2011-01-15 MED ORDER — OMEPRAZOLE 40 MG PO CPDR: 40.0000 mg | DELAYED_RELEASE_CAPSULE | Freq: Every day | ORAL | Status: DC

## 2011-01-15 MED ORDER — HYDROCODONE-ACETAMINOPHEN 7.5-325 MG PO TABS: 1.0000 | ORAL_TABLET | Freq: Four times a day (QID) | ORAL | Status: AC | PRN

## 2011-01-15 MED ORDER — PROMETHAZINE HCL 12.5 MG PO TABS: 12.5000 mg | ORAL_TABLET | Freq: Four times a day (QID) | ORAL | Status: AC | PRN

## 2011-01-15 MED ORDER — CIPROFLOXACIN HCL 500 MG PO TABS: 500.0000 mg | ORAL_TABLET | Freq: Two times a day (BID) | ORAL | Status: DC

## 2011-01-15 NOTE — Telephone Encounter (Signed)
Other test are suspicious for a mild case of pancreatitis. Diet/ Rx as we discussed. To ER if worse or not better Thx

## 2011-01-15 NOTE — Telephone Encounter (Signed)
Agree.  If she is better - start Kdur 20 meq bid #[phone] ref Thx

## 2011-01-15 NOTE — Telephone Encounter (Signed)
Advised patient to go to the ER if she did not feel any better in the next couple hours or if she felt any worse. Pt agreed

## 2011-01-15 NOTE — Patient Instructions (Signed)
To ER if worse

## 2011-01-15 NOTE — Progress Notes (Signed)
  Subjective:    Patient ID: Paige Hester, female    DOB: Jan 15, 1956, 55 y.o.   MRN: [id number]  HPI  C/o severe epig pain that started before lunch off and on. No bloating, had 2 BMs. She did not sleep due to pain 8-9/10 in the upper abd. Today  The pain is the same but she started heaving. She threw up x 1-2 - the emesis is mucous and yellow   Review of Systems  Constitutional: Negative for chills, activity change, appetite change, fatigue and unexpected weight change.  HENT: Negative for congestion, mouth sores and sinus pressure.   Eyes: Negative for visual disturbance.  Respiratory: Negative for cough and chest tightness.   Gastrointestinal: Positive for nausea and abdominal pain.  Genitourinary: Negative for frequency, difficulty urinating and vaginal pain.  Musculoskeletal: Negative for back pain and gait problem.  Skin: Negative for pallor and rash.  Neurological: Negative for dizziness, tremors, weakness, numbness and headaches.  Psychiatric/Behavioral: Negative for confusion and sleep disturbance.       Objective:   Physical Exam  Constitutional: She appears well-developed. No distress.       Obese, looks tired  HENT:  Head: Normocephalic.  Right Ear: External ear normal.  Left Ear: External ear normal.  Nose: Nose normal.  Mouth/Throat: Oropharynx is clear and moist.       Not dry  Eyes: Conjunctivae are normal. Pupils are equal, round, and reactive to light. Right eye exhibits no discharge. Left eye exhibits no discharge.  Neck: Normal range of motion. Neck supple. No JVD present. No tracheal deviation present. No thyromegaly present.  Cardiovascular: Normal rate, regular rhythm and normal heart sounds.   Pulmonary/Chest: No stridor. No respiratory distress. She has no wheezes.  Abdominal: Soft. Bowel sounds are normal. She exhibits no distension and no mass. There is tenderness (RUQ - mild). There is no rebound and no guarding.  Musculoskeletal: She exhibits  no edema and no tenderness.  Lymphadenopathy:    She has no cervical adenopathy.  Neurological: She displays normal reflexes. No cranial nerve deficit. She exhibits normal muscle tone. Coordination normal.  Skin: No rash noted. No erythema.       No icterus  Psychiatric: She has a normal mood and affect. Her behavior is normal. Judgment and thought content normal.    Procedure Note :    Procedure :   Point of care (POC) sonography examination   Indication: upper abd pain, acute; n/v   Equipment used: Sonosite M-Turbo with P21x/5-1 MHz transducer phased array probe. The images were stored in the unit and later transferred in storage.  The patient was placed in a decubitus position.  This study revealed a normal GB with a few hyperechoic lesions in it: poorly defined. No gall stones. [place] Murphy's sign was +/-.    Impression:   Limited abd [place] without gallstones. There are some poorly defined hyperechoic structures in the GB. Poor prep. We will get a full abd [place].          Assessment & Plan:

## 2011-01-15 NOTE — Telephone Encounter (Signed)
[facility] CALLED - Critical potassium 2.6 MD verbally informed.

## 2011-01-16 ENCOUNTER — Ambulatory Visit
Admission: RE | Admit: 2011-01-16 | Discharge: 2011-01-16 | Disposition: A | Discharge status: Home / Self Care | Payer: Federal, State, Local not specified - PPO | Source: Ambulatory Visit | Attending: Internal Medicine | Admitting: Internal Medicine

## 2011-01-16 ENCOUNTER — Other Ambulatory Visit: Payer: Self-pay | Admitting: Internal Medicine

## 2011-01-16 ENCOUNTER — Other Ambulatory Visit: Payer: BLUE CROSS/BLUE SHIELD

## 2011-01-16 ENCOUNTER — Telehealth: Payer: Self-pay | Admitting: Internal Medicine

## 2011-01-16 DIAGNOSIS — R1011 Right upper quadrant pain: Secondary | ICD-10-CM

## 2011-01-16 MED ORDER — POTASSIUM CHLORIDE CRYS ER 20 MEQ PO TBCR: 20.0000 meq | EXTENDED_RELEASE_TABLET | Freq: Two times a day (BID) | ORAL | Status: DC

## 2011-01-16 NOTE — Telephone Encounter (Signed)
Pt states she was feeling ok until she drove in for her ultrasound. Now has rumbling in stomach, denies nausea. Has not started medication yet but will start after u/s this a.m.  Pt states she feels about the same. Do you still want her to start Kdur? Please advise.

## 2011-01-16 NOTE — Telephone Encounter (Signed)
Noted. Please start Kdur. To ER if sick. Keep ROV Thx

## 2011-01-16 NOTE — Telephone Encounter (Signed)
Pt left voice message requesting copy of labs be mailed to her. Left message on voicemail to return my call.

## 2011-01-16 NOTE — Telephone Encounter (Signed)
Notified pt. Rx sent to [facility] and lab results mailed to pt.

## 2011-01-16 NOTE — Telephone Encounter (Signed)
Paige Hester, please, call her - how is she doing? Thx

## 2011-01-16 NOTE — Telephone Encounter (Signed)
See previous phone note.  

## 2011-01-17 ENCOUNTER — Encounter: Payer: Self-pay | Admitting: Internal Medicine

## 2011-01-17 ENCOUNTER — Ambulatory Visit (INDEPENDENT_AMBULATORY_CARE_PROVIDER_SITE_OTHER): Payer: Federal, State, Local not specified - PPO | Admitting: Internal Medicine

## 2011-01-17 ENCOUNTER — Telehealth: Payer: Self-pay | Admitting: Internal Medicine

## 2011-01-17 VITALS — BP 110/60 | HR 72 | Temp 98.7°F | Resp 16 | Wt 243.0 lb

## 2011-01-17 DIAGNOSIS — E876 Hypokalemia: Secondary | ICD-10-CM | POA: Insufficient documentation

## 2011-01-17 DIAGNOSIS — R1011 Right upper quadrant pain: Secondary | ICD-10-CM

## 2011-01-17 DIAGNOSIS — R112 Nausea with vomiting, unspecified: Secondary | ICD-10-CM

## 2011-01-17 DIAGNOSIS — K824 Cholesterolosis of gallbladder: Secondary | ICD-10-CM

## 2011-01-17 NOTE — Telephone Encounter (Signed)
Pt in office today for f/u with AVP. Closing phone note.

## 2011-01-17 NOTE — Assessment & Plan Note (Addendum)
Surg consult Dr Derrell Lolling  11/12 - multiple 01/16/11 [place] COMPLETE ABDOMINAL ULTRASOUND  Comparison: None  Findings:  Gallbladder: Numerous a small wall adherent echogenic structures  are noted without acoustic shadowing. These are most likely  polyps. No gallstones, wall thickening or pericholecystic fluid.  Negative sonographic Murphy's sign.  Common bile duct: Normal in caliber measuring a maximum of 3.75mm.  Liver: Multiple small hepatic cysts are noted. No worrisome  hepatic lesions or intrahepatic ductal dilatation.  IVC: Normal caliber.  Pancreas: Sonographically normal.  Spleen: Normal size and echogenicity without focal lesions.  Right Kidney: 10.6 cm in length. Normal renal cortical thickness  and echogenicity without focal lesions or hydronephrosis.  Left Kidney: 11.7 cm in length. Normal renal cortical thickness  and echogenicity without focal lesions or hydronephrosis.  Abdominal aorta: Normal caliber.  IMPRESSION:  1. Multiple gallbladder polyps.  2. No gallstones or findings for acute cholecystitis.  3. Multiple small hepatic cysts.  4. Normal sonographic appearance of the pancreas, spleen and both  kidneys.  Original Report Authenticated By: P. Loralie Champagne, M.D.

## 2011-01-17 NOTE — Assessment & Plan Note (Signed)
10/12 acute: pancreatitis vs GB polyps vs ? Full abd [place] is ordered

## 2011-01-17 NOTE — Telephone Encounter (Signed)
Paige Hester, please, inform patient that there are no gallstones, however there are multiple polyps in the GB. Is it ok w/her to ref her to a Careers adviser. Is she better? Thx

## 2011-01-17 NOTE — Telephone Encounter (Signed)
Left mess for patient to call back.  

## 2011-01-17 NOTE — Assessment & Plan Note (Signed)
Hold BP meds x 1-2 d

## 2011-01-17 NOTE — Assessment & Plan Note (Addendum)
Better.  Diet discussed Continue with current prescription therapy as reflected on the Med list incl. Cipro. 10/12 acute: pancreatitis vs GB polyps vs ?

## 2011-01-17 NOTE — Assessment & Plan Note (Signed)
10/12 acute: pancreatitis vs GB polyps vs ? Clear liquids Labs, [place] Continue with current prescription therapy as reflected on the Med list. Empiric Cipro

## 2011-01-17 NOTE — Progress Notes (Signed)
  Subjective:    Patient ID: Paige Hester, female    DOB: [birth year], 55 y.o.   MRN: [id number]  HPI  F/u abd pain and n/v - better now (almost resolved). F/u on abn labs.  Review of Systems  Constitutional: Negative for chills, activity change, appetite change, fatigue and unexpected weight change.  HENT: Negative for congestion, mouth sores and sinus pressure.   Eyes: Negative for visual disturbance.  Respiratory: Negative for cough and chest tightness.   Gastrointestinal: Negative for nausea and abdominal pain.  Genitourinary: Negative for frequency, difficulty urinating and vaginal pain.  Musculoskeletal: Negative for back pain and gait problem.  Skin: Negative for pallor and rash.  Neurological: Negative for dizziness, tremors, weakness, numbness and headaches.  Psychiatric/Behavioral: Negative for confusion and sleep disturbance.       Objective:   Physical Exam  Constitutional: She appears well-developed. No distress.       NAD obese  HENT:  Head: Normocephalic.  Right Ear: External ear normal.  Left Ear: External ear normal.  Nose: Nose normal.  Mouth/Throat: Oropharynx is clear and moist.  Eyes: Conjunctivae are normal. Pupils are equal, round, and reactive to light. Right eye exhibits no discharge. Left eye exhibits no discharge.  Neck: Normal range of motion. Neck supple. No JVD present. No tracheal deviation present. No thyromegaly present.  Cardiovascular: Normal rate, regular rhythm and normal heart sounds.   Pulmonary/Chest: No stridor. No respiratory distress. She has no wheezes.  Abdominal: Soft. Bowel sounds are normal. She exhibits no distension and no mass. There is tenderness (sensitive in RUQ). There is no rebound and no guarding.  Musculoskeletal: She exhibits no edema and no tenderness.  Lymphadenopathy:    She has no cervical adenopathy.  Neurological: She displays normal reflexes. No cranial nerve deficit. She exhibits normal muscle tone.  Coordination normal.  Skin: No rash noted. No erythema.  Psychiatric: She has a normal mood and affect. Her behavior is normal. Judgment and thought content normal.   US - GB polyps, no stones  Lab Results  Component Value Date   WBC 11.9* 01/15/2011   HGB 12.1 01/15/2011   HCT 35.8* 01/15/2011   PLT 369.0 01/15/2011   GLUCOSE 115* 01/15/2011   CHOL 189 01/03/2010   TRIG 52.0 01/03/2010   HDL 62.60 01/03/2010   LDLCALC 116* 01/03/2010   ALT 12 01/15/2011   AST 19 01/15/2011   NA 137 01/15/2011   K 2.6* 01/15/2011   CL 100 01/15/2011   CREATININE 0.8 01/15/2011   BUN 11 01/15/2011   CO2 28 01/15/2011   TSH 1.09 01/03/2010   Lipase  104       Assessment & Plan:

## 2011-01-17 NOTE — Assessment & Plan Note (Signed)
10/12 acute: pancreatitis vs GB polyps vs ? Dr Derrell Lolling -  consult pending

## 2011-01-17 NOTE — Assessment & Plan Note (Signed)
Replace po

## 2011-01-30 ENCOUNTER — Other Ambulatory Visit: Payer: Self-pay | Admitting: Internal Medicine

## 2011-02-10 ENCOUNTER — Encounter (INDEPENDENT_AMBULATORY_CARE_PROVIDER_SITE_OTHER): Payer: Self-pay | Admitting: General Surgery

## 2011-02-10 ENCOUNTER — Ambulatory Visit (INDEPENDENT_AMBULATORY_CARE_PROVIDER_SITE_OTHER): Payer: Federal, State, Local not specified - PPO | Admitting: General Surgery

## 2011-02-10 VITALS — BP 152/84 | HR 80 | Temp 97.0°F | Resp 16 | Ht 65.0 in | Wt 242.4 lb

## 2011-02-10 DIAGNOSIS — K811 Chronic cholecystitis: Secondary | ICD-10-CM

## 2011-02-10 DIAGNOSIS — K859 Acute pancreatitis without necrosis or infection, unspecified: Secondary | ICD-10-CM

## 2011-02-10 NOTE — Patient Instructions (Signed)
We have reviewed your symptoms and  your lab work and your x-ray evaluation to date. You have been advised you have gallbladder polyps, possibly gallstones,  and probably had an episode of pancreatitis.  We have discussed several options. You have been offered  Have surgery to remove your gallbladder.  You have also been offered a second opinion with the gastroenterologist.  As we discussed, your going to go home and think about this and call me back if you decide to have the surgery.

## 2011-02-10 NOTE — Progress Notes (Signed)
Patient ID: Paige Hester, female   DOB: 09-Oct-1955, 55 y.o.   MRN: [id number]  Chief Complaint  Patient presents with  . New Evaluation    eval of GB with polyps     HPI Paige Hester is a 55 y.o. female.  She is referredby Dr. Trinna Post Plotnikov for abdominal pain and gallbladder polyps.  On Oct. 29 she developed epigastric pain and nausea and vomiting.She has never had symptoms like this before. She was a little bit constipated but did not have diarrhea. She denied fever or chills. The pain continued and on October 31 she saw Dr. Posey Rea. Labwork revealed a WBC of 11,900, Hgb 12.1, normal liver function tests, lipase elevated to 107, and ESR 56 which is elevated.  She was given a prescription for Prilosec & Cipro and her pain resolved after about 48 hours, making the entire episode 4 four days.  Her ssymptoms have not returned.  A gallbladder ultrasound was performed on November 5. This shows numerous small echogenic structures in the gallbladder which were thought to be most likely polyps. No diagnostic gallstones were seen. No evidence of inflammation was seen. Common bile duct was normal. The liver and pancreas and spleen looked normal. She remains asymptomatic. HPI  Past Medical History  Diagnosis Date  . Hypertension   . Menopausal symptoms     since 2007  . Vitamin D deficiency     History reviewed. No pertinent past surgical history.  Family History  Problem Relation Age of Onset  . Heart disease Mother   . Hypertension Other   . Colon cancer Other   . Prostate cancer Other   . Cancer Father     Social History History  Substance Use Topics  . Smoking status: Former Games developer  . Smokeless tobacco: Never Used  . Alcohol Use: 4.2 oz/week    7 Glasses of wine per week    No Known Allergies  Current Outpatient Prescriptions  Medication Sig Dispense Refill  . benazepril-hydrochlorthiazide (LOTENSIN HCT) 20-12.5 MG per tablet TAKE TWO TABLETS BY MOUTH IN THE  MORNING  180 tablet  3  . Cholecalciferol (VITAMIN D3) 1000 UNITS tablet Take 1,000 Units by mouth daily.        Marland Kitchen omeprazole (PRILOSEC) 40 MG capsule Take 1 capsule (40 mg total) by mouth daily.  30 capsule  3  . potassium chloride SA (K-DUR,KLOR-CON) 20 MEQ tablet Take 1 tablet (20 mEq total) by mouth 2 (two) times daily.  60 tablet  0  . verapamil (CALAN-SR) 240 MG CR tablet TAKE ONE TABLET BY MOUTH EVERY DAY  90 tablet  3  . ciprofloxacin (CIPRO) 500 MG tablet Take 1 tablet (500 mg total) by mouth 2 (two) times daily.  20 tablet  0    Review of Systems Review of Systems  Constitutional: Negative for fever, chills, diaphoresis and unexpected weight change.  HENT: Negative for hearing loss, congestion, sore throat, trouble swallowing and voice change.   Eyes: Negative for visual disturbance.  Respiratory: Negative for cough and wheezing.   Cardiovascular: Negative for chest pain, palpitations and leg swelling.  Gastrointestinal: Positive for nausea, vomiting, abdominal pain and constipation. Negative for diarrhea, blood in stool, abdominal distention and anal bleeding.  Genitourinary: Negative for hematuria, vaginal bleeding and difficulty urinating.  Musculoskeletal: Negative for arthralgias.  Skin: Negative for rash and wound.  Neurological: Negative for seizures, syncope and headaches.  Hematological: Negative for adenopathy. Does not bruise/bleed easily.  Psychiatric/Behavioral: Negative for confusion.  Blood pressure 152/84, pulse 80, temperature 97 F (36.1 C), temperature source Temporal, resp. rate 16, height 5\' 5"  (1.651 m), weight 242 lb 6 oz (109.941 kg).  Physical Exam Physical Exam  Constitutional: She is oriented to person, place, and time. She appears well-developed and well-nourished. No distress.       Obese.  HENT:  Head: Normocephalic and atraumatic.  Nose: Nose normal.  Mouth/Throat: No oropharyngeal exudate.  Eyes: Conjunctivae and EOM are normal. Pupils  are equal, round, and reactive to light. Left eye exhibits no discharge. No scleral icterus.  Neck: Neck supple. No JVD present. No tracheal deviation present. No thyromegaly present.  Cardiovascular: Normal rate, regular rhythm, normal heart sounds and intact distal pulses.   No murmur heard. Pulmonary/Chest: Effort normal and breath sounds normal. No respiratory distress. She has no wheezes. She has no rales. She exhibits no tenderness.  Abdominal: Soft. Bowel sounds are normal. She exhibits no distension and no mass. There is no tenderness. There is no rebound and no guarding.       No scars.  Musculoskeletal: She exhibits no edema and no tenderness.  Lymphadenopathy:    She has no cervical adenopathy.  Neurological: She is alert and oriented to person, place, and time. She exhibits normal muscle tone. Coordination normal.  Skin: Skin is warm. No rash noted. She is not diaphoretic. No erythema. No pallor.  Psychiatric: She has a normal mood and affect. Her behavior is normal. Judgment and thought content normal.    Data Reviewed I have reviewed Dr. Loren Racer  office notes, the labwork, and the ultrasound.  Assessment    Epigastric pain, nausea, vomiting, hyper lipasemia. This is consistent with an episode of gallstone pancreatitis.  Gallbladder polyps seen on ultrasound. Pancreas and liver normal on [place].  Hypertension  Obesity    Plan    The patient is advised that the most probable cause of her pain is the passage of a gallstone, even though the ultrasound shows only polyps. She was offered laparoscopic cholecystectomy with cholangiogram. The procedure and its risks and complications were discussed in detail.  She was also offered referral to a gastroenterologist for a second opinion..  We talked about these issues for some time. She was told that there is a high probability that her symptoms would not recur following cholecystectomy, but that there is a small possibility that  some of the disease process could be working, including ulcer disease or primary pancreatic disease. This seems much less likely considering the fact she is not much of a drinker.  She is going to go home and think about all these issues. She was told  To continue the  Prilosec. She will call me should she decide to proceed with surgery. She will contact Dr. Posey Rea if she decides to have a second opinion by one of the gastroenterologists.  Return to see me p.r.n.      Kyndle Schlender M 02/10/2011, 10:03 AM

## 2011-02-28 ENCOUNTER — Encounter: Payer: Self-pay | Admitting: Internal Medicine

## 2011-02-28 ENCOUNTER — Ambulatory Visit (INDEPENDENT_AMBULATORY_CARE_PROVIDER_SITE_OTHER): Payer: Federal, State, Local not specified - PPO | Admitting: Internal Medicine

## 2011-02-28 VITALS — BP 148/90 | HR 84 | Temp 98.4°F | Resp 16 | Wt 244.0 lb

## 2011-02-28 DIAGNOSIS — I10 Essential (primary) hypertension: Secondary | ICD-10-CM

## 2011-02-28 DIAGNOSIS — K824 Cholesterolosis of gallbladder: Secondary | ICD-10-CM

## 2011-02-28 DIAGNOSIS — R1011 Right upper quadrant pain: Secondary | ICD-10-CM

## 2011-02-28 MED ORDER — BENAZEPRIL-HYDROCHLOROTHIAZIDE 20-12.5 MG PO TABS: 2.0000 | ORAL_TABLET | Freq: Every day | ORAL | Status: DC

## 2011-02-28 MED ORDER — VERAPAMIL HCL ER 360 MG PO CP24: 360.0000 mg | ORAL_CAPSULE | Freq: Every day | ORAL | Status: DC

## 2011-02-28 NOTE — Assessment & Plan Note (Signed)
11/12 - multiple 01/16/11 [place] COMPLETE ABDOMINAL ULTRASOUND  Comparison: None  Findings:  Gallbladder: Numerous a small wall adherent echogenic structures  are noted without acoustic shadowing. These are most likely  polyps. No gallstones, wall thickening or pericholecystic fluid.  Negative sonographic Murphy's sign.  Common bile duct: Normal in caliber measuring a maximum of 3.77mm.  Liver: Multiple small hepatic cysts are noted. No worrisome  hepatic lesions or intrahepatic ductal dilatation.  IVC: Normal caliber.  Pancreas: Sonographically normal.  Spleen: Normal size and echogenicity without focal lesions.  Right Kidney: 10.6 cm in length. Normal renal cortical thickness  and echogenicity without focal lesions or hydronephrosis.  Left Kidney: 11.7 cm in length. Normal renal cortical thickness  and echogenicity without focal lesions or hydronephrosis.  Abdominal aorta: Normal caliber.  IMPRESSION:  1. Multiple gallbladder polyps.  2. No gallstones or findings for acute cholecystitis.  3. Multiple small hepatic cysts.  4. Normal sonographic appearance of the pancreas, spleen and both  kidneys.  Original Report Authenticated By: P. Loralie Champagne, M.D.  GI cons --2nd opinion

## 2011-02-28 NOTE — Progress Notes (Signed)
  Subjective:    Patient ID: Paige Hester, female    DOB: 1955-10-19, 55 y.o.   MRN: [id number]  HPI  F/u HTN F/u RUQ pain - better and GB polyps - undecided about surgery  Review of Systems  Constitutional: Negative for chills, activity change, appetite change, fatigue and unexpected weight change.  HENT: Negative for congestion, mouth sores and sinus pressure.   Eyes: Negative for visual disturbance.  Respiratory: Negative for cough and chest tightness.   Gastrointestinal: Negative for nausea and abdominal pain.  Genitourinary: Negative for frequency, difficulty urinating and vaginal pain.  Musculoskeletal: Negative for back pain and gait problem.  Skin: Negative for pallor and rash.  Neurological: Negative for dizziness, tremors, weakness, numbness and headaches.  Psychiatric/Behavioral: Negative for confusion and sleep disturbance.   BP Readings from Last 3 Encounters:  02/28/11 148/90  02/10/11 152/84  01/17/11 110/60   Wt Readings from Last 3 Encounters:  02/28/11 244 lb (110.678 kg)  02/10/11 242 lb 6 oz (109.941 kg)  01/17/11 243 lb (110.224 kg)         Objective:   Physical Exam  Constitutional: She appears well-developed. No distress.       obese  HENT:  Head: Normocephalic.  Right Ear: External ear normal.  Left Ear: External ear normal.  Nose: Nose normal.  Mouth/Throat: Oropharynx is clear and moist.  Eyes: Conjunctivae are normal. Pupils are equal, round, and reactive to light. Right eye exhibits no discharge. Left eye exhibits no discharge.  Neck: Normal range of motion. Neck supple. No JVD present. No tracheal deviation present. No thyromegaly present.  Cardiovascular: Normal rate, regular rhythm and normal heart sounds.   Pulmonary/Chest: No stridor. No respiratory distress. She has no wheezes.  Abdominal: Soft. Bowel sounds are normal. She exhibits no distension and no mass. There is no tenderness. There is no rebound and no guarding.    Musculoskeletal: She exhibits edema (trace B). She exhibits no tenderness.  Lymphadenopathy:    She has no cervical adenopathy.  Neurological: She displays normal reflexes. No cranial nerve deficit. She exhibits normal muscle tone. Coordination normal.  Skin: No rash noted. No erythema.  Psychiatric: She has a normal mood and affect. Her behavior is normal. Judgment and thought content normal.   Lab Results  Component Value Date   WBC 11.9* 01/15/2011   HGB 12.1 01/15/2011   HCT 35.8* 01/15/2011   PLT 369.0 01/15/2011   GLUCOSE 115* 01/15/2011   CHOL 189 01/03/2010   TRIG 52.0 01/03/2010   HDL 62.60 01/03/2010   LDLCALC 116* 01/03/2010   ALT 12 01/15/2011   AST 19 01/15/2011   NA 137 01/15/2011   K 2.6* 01/15/2011   CL 100 01/15/2011   CREATININE 0.8 01/15/2011   BUN 11 01/15/2011   CO2 28 01/15/2011   TSH 1.09 01/03/2010          Assessment & Plan:

## 2011-02-28 NOTE — Assessment & Plan Note (Signed)
10/12 due to GB polyps S/p surg cons Dr Derrell Lolling No relapse

## 2011-02-28 NOTE — Assessment & Plan Note (Signed)
We increased Verapamil to 360 mg/d

## 2011-02-28 NOTE — Patient Instructions (Signed)
Low salt diet  BP Readings from Last 3 Encounters:  02/28/11 148/90  02/10/11 152/84  01/17/11 110/60   Wt Readings from Last 3 Encounters:  02/28/11 244 lb (110.678 kg)  02/10/11 242 lb 6 oz (109.941 kg)  01/17/11 243 lb (110.224 kg)

## 2012-02-20 ENCOUNTER — Other Ambulatory Visit: Payer: Self-pay | Admitting: Internal Medicine

## 2012-02-20 MED ORDER — VERAPAMIL HCL ER 360 MG PO CP24: 360.0000 mg | ORAL_CAPSULE | Freq: Every day | ORAL | Status: DC

## 2012-05-27 ENCOUNTER — Telehealth: Payer: Self-pay | Admitting: Internal Medicine

## 2012-05-27 NOTE — Telephone Encounter (Signed)
Requesting enough Benazepril to last until Monday's appt.  Last ov 2012.

## 2012-05-28 ENCOUNTER — Telehealth: Payer: Self-pay | Admitting: Internal Medicine

## 2012-05-28 MED ORDER — VERAPAMIL HCL ER 360 MG PO CP24: 360.0000 mg | ORAL_CAPSULE | Freq: Every day | ORAL | Status: DC

## 2012-05-28 MED ORDER — BENAZEPRIL-HYDROCHLOROTHIAZIDE 20-12.5 MG PO TABS: 2.0000 | ORAL_TABLET | Freq: Every day | ORAL | Status: DC

## 2012-05-28 NOTE — Telephone Encounter (Signed)
The patient called and is hoping to get a refill of her verapmil rx before her apt on Monday.

## 2012-05-28 NOTE — Telephone Encounter (Signed)
Done

## 2012-05-31 ENCOUNTER — Ambulatory Visit (INDEPENDENT_AMBULATORY_CARE_PROVIDER_SITE_OTHER): Payer: Federal, State, Local not specified - PPO | Admitting: Internal Medicine

## 2012-05-31 ENCOUNTER — Other Ambulatory Visit (INDEPENDENT_AMBULATORY_CARE_PROVIDER_SITE_OTHER): Payer: Federal, State, Local not specified - PPO

## 2012-05-31 ENCOUNTER — Encounter: Payer: Self-pay | Admitting: Internal Medicine

## 2012-05-31 VITALS — BP 148/78 | HR 72 | Temp 98.1°F | Resp 16 | Ht 65.0 in | Wt 243.0 lb

## 2012-05-31 DIAGNOSIS — I10 Essential (primary) hypertension: Secondary | ICD-10-CM

## 2012-05-31 DIAGNOSIS — K219 Gastro-esophageal reflux disease without esophagitis: Secondary | ICD-10-CM

## 2012-05-31 DIAGNOSIS — Z0000 Encounter for general adult medical examination without abnormal findings: Secondary | ICD-10-CM

## 2012-05-31 DIAGNOSIS — Z8249 Family history of ischemic heart disease and other diseases of the circulatory system: Secondary | ICD-10-CM

## 2012-05-31 DIAGNOSIS — E876 Hypokalemia: Secondary | ICD-10-CM

## 2012-05-31 DIAGNOSIS — R011 Cardiac murmur, unspecified: Secondary | ICD-10-CM

## 2012-05-31 DIAGNOSIS — Z23 Encounter for immunization: Secondary | ICD-10-CM

## 2012-05-31 LAB — HEPATIC FUNCTION PANEL
ALT: 16 U/L (ref 0–35)
AST: 21 U/L (ref 0–37)
Albumin: 3.7 g/dL (ref 3.5–5.2)
Alkaline Phosphatase: 74 U/L (ref 39–117)
Total Protein: 7.5 g/dL (ref 6.0–8.3)

## 2012-05-31 LAB — CBC WITH DIFFERENTIAL/PLATELET
Basophils Absolute: 0.1 10*3/uL (ref 0.0–0.1)
Basophils Relative: 1.1 % (ref 0.0–3.0)
Eosinophils Absolute: 0.4 10*3/uL (ref 0.0–0.7)
HCT: 36.5 % (ref 36.0–46.0)
Hemoglobin: 12.1 g/dL (ref 12.0–15.0)
Lymphs Abs: 2.9 10*3/uL (ref 0.7–4.0)
MCHC: 33.3 g/dL (ref 30.0–36.0)
Neutro Abs: 3 10*3/uL (ref 1.4–7.7)
RBC: 4.41 Mil/uL (ref 3.87–5.11)
RDW: 14.6 % (ref 11.5–14.6)

## 2012-05-31 LAB — LIPID PANEL: Total CHOL/HDL Ratio: 3

## 2012-05-31 LAB — URINALYSIS, ROUTINE W REFLEX MICROSCOPIC
Leukocytes, UA: NEGATIVE
Nitrite: NEGATIVE
Specific Gravity, Urine: >=1.03 (ref 1.000–1.030)
Urine Glucose: NEGATIVE
Urobilinogen, UA: 0.2 (ref 0.0–1.0)

## 2012-05-31 LAB — BASIC METABOLIC PANEL
CO2: 30 mEq/L (ref 19–32)
Chloride: 100 mEq/L (ref 96–112)
Glucose, Bld: 98 mg/dL (ref 70–99)
Potassium: 3.4 mEq/L — ABNORMAL LOW (ref 3.5–5.1)
Sodium: 138 mEq/L (ref 135–145)

## 2012-05-31 LAB — MAGNESIUM: Magnesium: 2 mg/dL (ref 1.5–2.5)

## 2012-05-31 MED ORDER — POTASSIUM CHLORIDE CRYS ER 20 MEQ PO TBCR: 20.0000 meq | EXTENDED_RELEASE_TABLET | Freq: Two times a day (BID) | ORAL | Status: DC

## 2012-05-31 MED ORDER — ASPIRIN 325 MG PO TABS: 325.0000 mg | ORAL_TABLET | Freq: Every day | ORAL | Status: DC

## 2012-05-31 MED ORDER — RANITIDINE HCL 300 MG PO CAPS: 300.0000 mg | ORAL_CAPSULE | Freq: Every evening | ORAL | Status: DC

## 2012-05-31 NOTE — Assessment & Plan Note (Signed)
Multiple relatives ASA qd Cr cons Dr Tenny Craw

## 2012-05-31 NOTE — Assessment & Plan Note (Signed)
ECHO nl 2010

## 2012-05-31 NOTE — Assessment & Plan Note (Signed)
Chronic States BP is nl at home Continue with current prescription therapy as reflected on the Med list.  

## 2012-05-31 NOTE — Progress Notes (Signed)
   Subjective:    HPI  The patient is here for a wellness exam. The patient has been doing well overall without major physical or psychological issues going on lately.  F/u HTN - states BP is nl at home F/u RUQ pain - better and GB polyps - no relapse (doesn't want surgery)  Review of Systems  Constitutional: Negative for chills, activity change, appetite change, fatigue and unexpected weight change.  HENT: Negative for congestion, mouth sores and sinus pressure.   Eyes: Negative for visual disturbance.  Respiratory: Negative for cough and chest tightness.   Gastrointestinal: Negative for nausea and abdominal pain.  Genitourinary: Negative for frequency, difficulty urinating and vaginal pain.  Musculoskeletal: Negative for back pain and gait problem.  Skin: Negative for pallor and rash.  Neurological: Negative for dizziness, tremors, weakness, numbness and headaches.  Psychiatric/Behavioral: Negative for confusion and sleep disturbance.   BP Readings from Last 3 Encounters:  05/31/12 148/78  02/28/11 148/90  02/10/11 152/84   Wt Readings from Last 3 Encounters:  05/31/12 243 lb (110.224 kg)  02/28/11 244 lb (110.678 kg)  02/10/11 242 lb 6 oz (109.941 kg)      Objective:   Physical Exam  Constitutional: She appears well-developed. No distress.  obese  HENT:  Head: Normocephalic.  Right Ear: External ear normal.  Left Ear: External ear normal.  Nose: Nose normal.  Mouth/Throat: Oropharynx is clear and moist.  Eyes: Conjunctivae are normal. Pupils are equal, round, and reactive to light. Right eye exhibits no discharge. Left eye exhibits no discharge.  Neck: Normal range of motion. Neck supple. No JVD present. No tracheal deviation present. No thyromegaly present.  Cardiovascular: Normal rate and regular rhythm.   Murmur (2/6) heard. Pulmonary/Chest: No stridor. No respiratory distress. She has no wheezes.  Abdominal: Soft. Bowel sounds are normal. She exhibits no  distension and no mass. There is no tenderness. There is no rebound and no guarding.  Musculoskeletal: She exhibits edema (trace B). She exhibits no tenderness.  Lymphadenopathy:    She has no cervical adenopathy.  Neurological: She displays normal reflexes. No cranial nerve deficit. She exhibits normal muscle tone. Coordination normal.  Skin: No rash noted. No erythema.  Psychiatric: She has a normal mood and affect. Her behavior is normal. Judgment and thought content normal.   EKG  ECHO 2010 -- ok  Lab Results  Component Value Date   WBC 11.9* 01/15/2011   HGB 12.1 01/15/2011   HCT 35.8* 01/15/2011   PLT 369.0 01/15/2011   GLUCOSE 115* 01/15/2011   CHOL 189 01/03/2010   TRIG 52.0 01/03/2010   HDL 62.60 01/03/2010   LDLCALC 116* 01/03/2010   ALT 12 01/15/2011   AST 19 01/15/2011   NA 137 01/15/2011   K 2.6* 01/15/2011   CL 100 01/15/2011   CREATININE 0.8 01/15/2011   BUN 11 01/15/2011   CO2 28 01/15/2011   TSH 1.09 01/03/2010          Assessment & Plan:

## 2012-05-31 NOTE — Assessment & Plan Note (Signed)
We discussed age appropriate health related issues, including available/recomended screening tests and vaccinations. We discussed a need for adhering to healthy diet and exercise. Labs/EKG were reviewed/ordered. All questions were answered.   

## 2012-05-31 NOTE — Assessment & Plan Note (Signed)
Start Zantac 

## 2012-05-31 NOTE — Assessment & Plan Note (Signed)
BMET 

## 2012-06-01 ENCOUNTER — Telehealth: Payer: Self-pay | Admitting: Internal Medicine

## 2012-06-01 MED ORDER — POTASSIUM CHLORIDE CRYS ER 20 MEQ PO TBCR: 20.0000 meq | EXTENDED_RELEASE_TABLET | Freq: Every day | ORAL | Status: DC

## 2012-06-01 NOTE — Telephone Encounter (Signed)
Paige Hester, please, inform patient that all labs are normal except for a little decreased potassium. Take potassium 1 qd Thx

## 2012-06-01 NOTE — Telephone Encounter (Signed)
Left detailed mess informing pt of below.  

## 2012-06-29 ENCOUNTER — Other Ambulatory Visit: Payer: Self-pay | Admitting: Internal Medicine

## 2012-07-06 ENCOUNTER — Other Ambulatory Visit: Payer: Self-pay | Admitting: Internal Medicine

## 2012-07-07 ENCOUNTER — Telehealth: Payer: Self-pay | Admitting: Internal Medicine

## 2012-07-07 NOTE — Telephone Encounter (Signed)
Caller: Muslima/Patient; Phone: [phone]; Reason for Call: Patient reports her Benazapril was not at the pharmacy when she went to pick up her medications on 07/06/12.  Reviewed chart and verified medication was called in on 07/06/12 and it should be at the pharmacy now.  Patient verbalized understanding.

## 2012-07-26 ENCOUNTER — Ambulatory Visit: Payer: Federal, State, Local not specified - PPO | Admitting: Internal Medicine

## 2012-09-24 ENCOUNTER — Ambulatory Visit: Payer: Federal, State, Local not specified - PPO | Admitting: Internal Medicine

## 2012-10-15 ENCOUNTER — Ambulatory Visit: Payer: Federal, State, Local not specified - PPO | Admitting: Internal Medicine

## 2012-11-30 ENCOUNTER — Ambulatory Visit (INDEPENDENT_AMBULATORY_CARE_PROVIDER_SITE_OTHER): Payer: Federal, State, Local not specified - PPO | Admitting: Internal Medicine

## 2012-11-30 ENCOUNTER — Other Ambulatory Visit (INDEPENDENT_AMBULATORY_CARE_PROVIDER_SITE_OTHER): Payer: Federal, State, Local not specified - PPO

## 2012-11-30 ENCOUNTER — Encounter: Payer: Self-pay | Admitting: Internal Medicine

## 2012-11-30 VITALS — BP 148/72 | HR 72 | Temp 97.3°F | Resp 16 | Wt 242.0 lb

## 2012-11-30 DIAGNOSIS — Z23 Encounter for immunization: Secondary | ICD-10-CM

## 2012-11-30 DIAGNOSIS — K219 Gastro-esophageal reflux disease without esophagitis: Secondary | ICD-10-CM

## 2012-11-30 DIAGNOSIS — E876 Hypokalemia: Secondary | ICD-10-CM

## 2012-11-30 DIAGNOSIS — D509 Iron deficiency anemia, unspecified: Secondary | ICD-10-CM

## 2012-11-30 DIAGNOSIS — K824 Cholesterolosis of gallbladder: Secondary | ICD-10-CM

## 2012-11-30 DIAGNOSIS — I10 Essential (primary) hypertension: Secondary | ICD-10-CM

## 2012-11-30 LAB — CBC WITH DIFFERENTIAL/PLATELET
Basophils Absolute: 0.1 10*3/uL (ref 0.0–0.1)
Basophils Relative: 0.8 % (ref 0.0–3.0)
Hemoglobin: 12.4 g/dL (ref 12.0–15.0)
Lymphocytes Relative: 40.6 % (ref 12.0–46.0)
Monocytes Relative: 6.3 % (ref 3.0–12.0)
Neutro Abs: 3.3 10*3/uL (ref 1.4–7.7)
Neutrophils Relative %: 46.6 % (ref 43.0–77.0)
RBC: 4.46 Mil/uL (ref 3.87–5.11)
RDW: 13.8 % (ref 11.5–14.6)

## 2012-11-30 LAB — BASIC METABOLIC PANEL
Calcium: 9.8 mg/dL (ref 8.4–10.5)
Creatinine, Ser: 0.8 mg/dL (ref 0.4–1.2)
GFR: 93.44 mL/min (ref >60.00)
Sodium: 139 mEq/L (ref 135–145)

## 2012-11-30 MED ORDER — POTASSIUM CHLORIDE CRYS ER 20 MEQ PO TBCR: 20.0000 meq | EXTENDED_RELEASE_TABLET | Freq: Every day | ORAL | Status: DC

## 2012-11-30 NOTE — Assessment & Plan Note (Signed)
No abd pain relapse

## 2012-11-30 NOTE — Assessment & Plan Note (Signed)
CBC, iron 

## 2012-11-30 NOTE — Assessment & Plan Note (Signed)
Continue with current prescription therapy as reflected on the Med list.  

## 2012-11-30 NOTE — Progress Notes (Signed)
   Subjective:    HPI    F/u HTN - states BP is nl at home F/u RUQ pain - better and GB polyps - no relapse (doesn't want surgery) C/o wt gain  Review of Systems  Constitutional: Negative for chills, activity change, appetite change, fatigue and unexpected weight change.  HENT: Negative for congestion, mouth sores and sinus pressure.   Eyes: Negative for visual disturbance.  Respiratory: Negative for cough and chest tightness.   Gastrointestinal: Negative for nausea and abdominal pain.  Genitourinary: Negative for frequency, difficulty urinating and vaginal pain.  Musculoskeletal: Negative for back pain and gait problem.  Skin: Negative for pallor and rash.  Neurological: Negative for dizziness, tremors, weakness, numbness and headaches.  Psychiatric/Behavioral: Negative for confusion and sleep disturbance.   BP Readings from Last 3 Encounters:  11/30/12 148/72  05/31/12 148/78  02/28/11 148/90   Wt Readings from Last 3 Encounters:  11/30/12 242 lb (109.77 kg)  05/31/12 243 lb (110.224 kg)  02/28/11 244 lb (110.678 kg)      Objective:   Physical Exam  Constitutional: She appears well-developed. No distress.  obese  HENT:  Head: Normocephalic.  Right Ear: External ear normal.  Left Ear: External ear normal.  Nose: Nose normal.  Mouth/Throat: Oropharynx is clear and moist.  Eyes: Conjunctivae are normal. Pupils are equal, round, and reactive to light. Right eye exhibits no discharge. Left eye exhibits no discharge.  Neck: Normal range of motion. Neck supple. No JVD present. No tracheal deviation present. No thyromegaly present.  Cardiovascular: Normal rate and regular rhythm.   Murmur (2/6) heard. Pulmonary/Chest: No stridor. No respiratory distress. She has no wheezes.  Abdominal: Soft. Bowel sounds are normal. She exhibits no distension and no mass. There is no tenderness. There is no rebound and no guarding.  Musculoskeletal: She exhibits edema (trace B). She  exhibits no tenderness.  Lymphadenopathy:    She has no cervical adenopathy.  Neurological: She displays normal reflexes. No cranial nerve deficit. She exhibits normal muscle tone. Coordination normal.  Skin: No rash noted. No erythema.  Psychiatric: She has a normal mood and affect. Her behavior is normal. Judgment and thought content normal.   EKG  ECHO 2010 -- ok  Lab Results  Component Value Date   WBC 7.0 05/31/2012   HGB 12.1 05/31/2012   HCT 36.5 05/31/2012   PLT 327.0 05/31/2012   GLUCOSE 98 05/31/2012   CHOL 178 05/31/2012   TRIG 95.0 05/31/2012   HDL 66.00 05/31/2012   LDLCALC 93 05/31/2012   ALT 16 05/31/2012   AST 21 05/31/2012   NA 138 05/31/2012   K 3.4* 05/31/2012   CL 100 05/31/2012   CREATININE 0.9 05/31/2012   BUN 15 05/31/2012   CO2 30 05/31/2012   TSH 1.61 05/31/2012          Assessment & Plan:

## 2012-12-13 ENCOUNTER — Ambulatory Visit: Payer: Federal, State, Local not specified - PPO | Admitting: Internal Medicine

## 2012-12-22 ENCOUNTER — Telehealth: Payer: Self-pay | Admitting: Internal Medicine

## 2012-12-22 NOTE — Telephone Encounter (Signed)
Pt request referral for OBGYN for pap smear and mammogram. Please advise.

## 2012-12-22 NOTE — Telephone Encounter (Signed)
Ok. Would she like to see Rene Kocher? If yes pls schedule. Thx

## 2012-12-23 ENCOUNTER — Telehealth: Payer: Self-pay | Admitting: Internal Medicine

## 2012-12-23 ENCOUNTER — Encounter: Payer: Self-pay | Admitting: Internal Medicine

## 2012-12-23 ENCOUNTER — Ambulatory Visit (INDEPENDENT_AMBULATORY_CARE_PROVIDER_SITE_OTHER): Payer: Federal, State, Local not specified - PPO | Admitting: Internal Medicine

## 2012-12-23 ENCOUNTER — Ambulatory Visit (HOSPITAL_COMMUNITY): Payer: Federal, State, Local not specified - PPO | Attending: Cardiovascular Disease

## 2012-12-23 VITALS — BP 186/86 | HR 55 | Ht 65.0 in | Wt 244.0 lb

## 2012-12-23 DIAGNOSIS — I10 Essential (primary) hypertension: Secondary | ICD-10-CM

## 2012-12-23 DIAGNOSIS — Z87891 Personal history of nicotine dependence: Secondary | ICD-10-CM | POA: Insufficient documentation

## 2012-12-23 DIAGNOSIS — R0989 Other specified symptoms and signs involving the circulatory and respiratory systems: Secondary | ICD-10-CM

## 2012-12-23 DIAGNOSIS — G458 Other transient cerebral ischemic attacks and related syndromes: Secondary | ICD-10-CM

## 2012-12-23 DIAGNOSIS — I658 Occlusion and stenosis of other precerebral arteries: Secondary | ICD-10-CM | POA: Insufficient documentation

## 2012-12-23 DIAGNOSIS — I6529 Occlusion and stenosis of unspecified carotid artery: Secondary | ICD-10-CM

## 2012-12-23 DIAGNOSIS — I2510 Atherosclerotic heart disease of native coronary artery without angina pectoris: Secondary | ICD-10-CM | POA: Insufficient documentation

## 2012-12-23 DIAGNOSIS — R011 Cardiac murmur, unspecified: Secondary | ICD-10-CM

## 2012-12-23 MED ORDER — POTASSIUM CHLORIDE CRYS ER 20 MEQ PO TBCR: 20.0000 meq | EXTENDED_RELEASE_TABLET | Freq: Every day | ORAL | Status: DC

## 2012-12-23 NOTE — Patient Instructions (Signed)
Your physician has requested that you have an echocardiogram. You will also need a blood pressure check that day as well.  Echocardiography is a painless test that uses sound waves to create images of your heart. It provides your doctor with information about the size and shape of your heart and how well your heart's chambers and valves are working. This procedure takes approximately one hour. There are no restrictions for this procedure.  Your physician has requested that you have a carotid duplex. This test is an ultrasound of the carotid arteries in your neck. It looks at blood flow through these arteries that supply the brain with blood. Allow one hour for this exam. There are no restrictions or special instructions.

## 2012-12-23 NOTE — Telephone Encounter (Signed)
ERROR

## 2012-12-23 NOTE — Telephone Encounter (Signed)
Pt has an appt with [facility] 12/27/12.

## 2012-12-23 NOTE — Progress Notes (Signed)
HPI Patient is a 57 yo with history of HTN, HL, and a family history of CAD  She has no known history of CAD  Denies siginficant CP No Known Allergies  Current Outpatient Prescriptions  Medication Sig Dispense Refill  . aspirin (BAYER ASPIRIN) 325 MG tablet Take 1 tablet (325 mg total) by mouth daily.  100 tablet  3  . benazepril-hydrochlorthiazide (LOTENSIN HCT) 20-12.5 MG per tablet TAKE TWO TABLETS BY MOUTH ONCE DAILY  60 tablet  5  . Cholecalciferol (VITAMIN D3) 1000 UNITS tablet Take 1,000 Units by mouth daily.        . Multiple Vitamin (MULTIVITAMIN) tablet Take 1 tablet by mouth daily.      . potassium chloride SA (K-DUR,KLOR-CON) 20 MEQ tablet Take 1 tablet (20 mEq total) by mouth daily.  30 tablet  11  . verapamil (VERELAN PM) 360 MG 24 hr capsule TAKE ONE CAPSULE BY MOUTH AT BEDTIME  30 capsule  5   No current facility-administered medications for this visit.    Past Medical History  Diagnosis Date  . Hypertension   . Menopausal symptoms     since 2007  . Vitamin D deficiency   . Gallbladder polyp 2012    No past surgical history on file.  Family History  Problem Relation Age of Onset  . Heart disease Mother   . Hypertension Other   . Colon cancer Other   . Prostate cancer Other   . Cancer Father   . Heart disease Father 46    CAD, STENT  . Heart disease Brother     History   Social History  . Marital Status: Married    Spouse Name: N/A    Number of Children: N/A  . Years of Education: N/A   Occupational History  . Not on file.   Social History Main Topics  . Smoking status: Former Games developer  . Smokeless tobacco: Never Used  . Alcohol Use: 4.2 oz/week    7 Glasses of wine per week  . Drug Use: No  . Sexual Activity: Yes   Other Topics Concern  . Not on file   Social History Narrative  . No narrative on file    Review of Systems:  All systems reviewed.  They are negative to the above problem except as previously stated.  Vital Signs: BP 186/86   Pulse 55  Ht 5\' 5"  (1.651 m)  Wt 244 lb (110.678 kg)  BMI 40.6 kg/m2  Physical Exam Patient is in NAD HEENT:  Normocephalic, atraumatic. EOMI, PERRLA.  Neck: JVP is normal.  ? bruit.  Lungs: clear to auscultation. No rales no wheezes.  Heart: Regular rate and rhythm. Normal S1, S2. No S3.   No significant murmurs. PMI not displaced.  Abdomen:  Supple, nontender. Normal bowel sounds. No masses. No hepatomegaly.  Extremities:   Good distal pulses throughout. No lower extremity edema.  Musculoskeletal :moving all extremities.  Neuro:   alert and oriented x3.  CN II-XII grossly intact.  EKG  SB 55 bpm  First degree AV block  PR 260 msec  Poss anteroseptal MI Assessment and Plan:  1.  Cardiac risk stratification.  Patient does have signif risk factors  But she denies active symptoms.   I would follow  Check echo with history of HTN    2.  HTN  BP is high  ? If anxious  Will repeat when comes in for echo  Echo to evalaute for end organ effect  3.  Bruit  Check carotid USN  4.  Lipids  WIll need to be followed.  Rx depends on USN results.  5.  Morbid obesity  I encouraged her to incrase her active  Would check into dietary for wt loss  Patient knows she has to lose.

## 2012-12-27 ENCOUNTER — Other Ambulatory Visit (HOSPITAL_COMMUNITY)
Admission: RE | Admit: 2012-12-27 | Discharge: 2012-12-27 | Disposition: A | Discharge status: Home / Self Care | Payer: Federal, State, Local not specified - PPO | Source: Ambulatory Visit | Attending: Internal Medicine | Admitting: Internal Medicine

## 2012-12-27 ENCOUNTER — Encounter: Payer: Self-pay | Admitting: Internal Medicine

## 2012-12-27 ENCOUNTER — Ambulatory Visit (INDEPENDENT_AMBULATORY_CARE_PROVIDER_SITE_OTHER): Payer: Federal, State, Local not specified - PPO | Admitting: Internal Medicine

## 2012-12-27 VITALS — BP 140/82 | HR 67 | Temp 98.0°F | Wt 244.8 lb

## 2012-12-27 DIAGNOSIS — Z124 Encounter for screening for malignant neoplasm of cervix: Secondary | ICD-10-CM

## 2012-12-27 DIAGNOSIS — K5900 Constipation, unspecified: Secondary | ICD-10-CM

## 2012-12-27 DIAGNOSIS — Z1239 Encounter for other screening for malignant neoplasm of breast: Secondary | ICD-10-CM

## 2012-12-27 DIAGNOSIS — N750 Cyst of Bartholin's gland: Secondary | ICD-10-CM

## 2012-12-27 DIAGNOSIS — N816 Rectocele: Secondary | ICD-10-CM

## 2012-12-27 DIAGNOSIS — Z01419 Encounter for gynecological examination (general) (routine) without abnormal findings: Secondary | ICD-10-CM | POA: Insufficient documentation

## 2012-12-27 LAB — HM PAP SMEAR

## 2012-12-27 NOTE — Patient Instructions (Signed)

## 2012-12-27 NOTE — Progress Notes (Signed)
Subjective:    Patient ID: Paige Hester, female    DOB: 1956/01/26, 57 y.o.   MRN: [id number]  HPI  Pt presents to the clinic today for her pap smear. She is post menopausal. Her last pap smear was more than 2 years ago. Her last mammogram was more than 2 years ago. She denies vaginal complaints. She does report nipple discharge out of the right nipple about 1 month ago. I was cream colored. She has not noticed any discharge since.  Review of Systems  Past Medical History  Diagnosis Date  . Hypertension   . Menopausal symptoms     since 2007  . Vitamin D deficiency   . Gallbladder polyp 2012    Current Outpatient Prescriptions  Medication Sig Dispense Refill  . aspirin (BAYER ASPIRIN) 325 MG tablet Take 1 tablet (325 mg total) by mouth daily.  100 tablet  3  . benazepril-hydrochlorthiazide (LOTENSIN HCT) 20-12.5 MG per tablet TAKE TWO TABLETS BY MOUTH ONCE DAILY  60 tablet  5  . Cholecalciferol (VITAMIN D3) 1000 UNITS tablet Take 1,000 Units by mouth daily.        . Multiple Vitamin (MULTIVITAMIN) tablet Take 1 tablet by mouth daily.      . potassium chloride SA (K-DUR,KLOR-CON) 20 MEQ tablet Take 1 tablet (20 mEq total) by mouth daily.  30 tablet  11  . verapamil (VERELAN PM) 360 MG 24 hr capsule TAKE ONE CAPSULE BY MOUTH AT BEDTIME  30 capsule  5   No current facility-administered medications for this visit.    No Known Allergies  Family History  Problem Relation Age of Onset  . Heart disease Mother   . Hypertension Other   . Colon cancer Other   . Prostate cancer Other   . Cancer Father   . Heart disease Father 90    CAD, STENT  . Heart disease Brother     History   Social History  . Marital Status: Married    Spouse Name: N/A    Number of Children: N/A  . Years of Education: N/A   Occupational History  . Not on file.   Social History Main Topics  . Smoking status: Former Games developer  . Smokeless tobacco: Never Used  . Alcohol Use: 4.2 oz/week    7  Glasses of wine per week  . Drug Use: No  . Sexual Activity: Yes   Other Topics Concern  . Not on file   Social History Narrative  . No narrative on file     Constitutional: Denies fever, malaise, fatigue, headache or abrupt weight changes.  Gastrointestinal: Pt reports constipation. Denies abdominal pain, bloating, diarrhea or blood in the stool.  GU: Denies urgency, frequency, pain with urination, burning sensation, blood in urine, odor or discharge.   No other specific complaints in a complete review of systems (except as listed in HPI above).     Objective:   Physical Exam   Constitutional:  Alert, oriented x 4, well developed, well nourished in no apparent distress. Cardiovascular: Normal rate and rhythm. S1,S2 noted.  No murmur, rubs or gallops noted. No JVD or BLE edema. No carotid bruits noted. Pulmonary/Chest: Normal effort and positive vesicular breath sounds. No respiratory distress. No wheezes, rales or ronchi noted.  Abdomen: Slightly distended but  nontender. Normal bowel sounds, no bruits noted. No distention or masses noted. Liver, spleen and kidneys non palpable. Genitourinary: Normal female anatomy. Uterus midline. No CMT or discharge noted. Adenexa non palpable. Bartholin's cyst  noted at 8 o'clock on the left side, non tender. Moderate rectocele which made the exam very difficult.  Breast without lumps or masses. Left nipple inverted.       Assessment & Plan:   Screening for cervical cancer with routine gyn exam:  Pap performed- will call you in 1 week with the results Bimanual and breast exam performed-normal Rectocele with constipation increase water intake and take stool softener daily Will set up your mammogram  RTC at your next scheduled follow up with Dr. Posey Rea

## 2013-01-11 ENCOUNTER — Ambulatory Visit (HOSPITAL_COMMUNITY): Payer: Federal, State, Local not specified - PPO | Attending: Cardiology

## 2013-01-11 ENCOUNTER — Ambulatory Visit (INDEPENDENT_AMBULATORY_CARE_PROVIDER_SITE_OTHER): Payer: Federal, State, Local not specified - PPO | Admitting: *Deleted

## 2013-01-11 ENCOUNTER — Ambulatory Visit
Admission: RE | Admit: 2013-01-11 | Discharge: 2013-01-11 | Disposition: A | Discharge status: Home / Self Care | Payer: Federal, State, Local not specified - PPO | Source: Ambulatory Visit | Attending: Internal Medicine | Admitting: Internal Medicine

## 2013-01-11 VITALS — BP 150/80 | HR 64 | Resp 18

## 2013-01-11 DIAGNOSIS — I059 Rheumatic mitral valve disease, unspecified: Secondary | ICD-10-CM | POA: Insufficient documentation

## 2013-01-11 DIAGNOSIS — R011 Cardiac murmur, unspecified: Secondary | ICD-10-CM | POA: Insufficient documentation

## 2013-01-11 DIAGNOSIS — I10 Essential (primary) hypertension: Secondary | ICD-10-CM

## 2013-01-11 DIAGNOSIS — Z87891 Personal history of nicotine dependence: Secondary | ICD-10-CM | POA: Insufficient documentation

## 2013-01-11 DIAGNOSIS — Z1239 Encounter for other screening for malignant neoplasm of breast: Secondary | ICD-10-CM

## 2013-01-11 LAB — HM MAMMOGRAPHY

## 2013-01-11 NOTE — Progress Notes (Signed)
Echocardiogram performed.  

## 2013-01-11 NOTE — Patient Instructions (Signed)
Patient aware that she will receive a call after Dr.Ross reviews the echo and BP results.

## 2013-01-12 ENCOUNTER — Telehealth: Payer: Self-pay | Admitting: Internal Medicine

## 2013-01-12 NOTE — Telephone Encounter (Signed)
New message   Returned Debra's call.   Will be home all afternoon.

## 2013-01-12 NOTE — Telephone Encounter (Signed)
Notes Recorded by Deliah Goody, RN on 01/12/2013 at 4:07 PM Spoke with pt, aware of echo results. She had not taken her medicine prior to the echo. She has a blood pressure cuff at home. She will track her bp once daily and then report those numbers to [place] and then we will decide if med changes are needed. Pt agreed with this plan. She has been taking her verapamil in the morning and will change that to the evening to help with better control.

## 2013-02-04 ENCOUNTER — Other Ambulatory Visit: Payer: Self-pay | Admitting: Internal Medicine

## 2013-03-01 ENCOUNTER — Telehealth: Payer: Self-pay | Admitting: *Deleted

## 2013-03-01 ENCOUNTER — Other Ambulatory Visit: Payer: Self-pay | Admitting: *Deleted

## 2013-03-01 MED ORDER — VERAPAMIL HCL ER 360 MG PO CP24: ORAL_CAPSULE | ORAL | Status: DC

## 2013-03-01 NOTE — Telephone Encounter (Signed)
Patient called and requested that 16 pills of Verapamil be sent to her pharmacy. This was completed. JG//CMA

## 2013-03-09 ENCOUNTER — Telehealth: Payer: Self-pay | Admitting: Internal Medicine

## 2013-03-09 ENCOUNTER — Other Ambulatory Visit: Payer: Self-pay | Admitting: *Deleted

## 2013-03-09 MED ORDER — BENAZEPRIL-HYDROCHLOROTHIAZIDE 20-12.5 MG PO TABS: ORAL_TABLET | ORAL | Status: DC

## 2013-03-09 NOTE — Telephone Encounter (Signed)
Pt called her pharmacy and here last week for a refill on Lotensin HCT.  The other medication she requested was sent to the pharmacy.  The generic Lotensin hasn't gone to the pharmacy.  Her pharmacy is [facility] on [address] In [place], but could this be called into the [facility] on [address] in [place]?

## 2013-03-22 ENCOUNTER — Other Ambulatory Visit: Payer: Self-pay | Admitting: *Deleted

## 2013-03-22 MED ORDER — VERAPAMIL HCL ER 360 MG PO CP24: ORAL_CAPSULE | ORAL | Status: DC

## 2013-03-22 MED ORDER — BENAZEPRIL-HYDROCHLOROTHIAZIDE 20-12.5 MG PO TABS: ORAL_TABLET | ORAL | Status: DC

## 2013-03-23 ENCOUNTER — Ambulatory Visit (INDEPENDENT_AMBULATORY_CARE_PROVIDER_SITE_OTHER): Payer: Federal, State, Local not specified - PPO | Admitting: Family Medicine

## 2013-03-23 ENCOUNTER — Encounter: Payer: Self-pay | Admitting: Family Medicine

## 2013-03-23 VITALS — BP 148/72 | HR 77 | Temp 97.6°F | Resp 16 | Wt 243.1 lb

## 2013-03-23 DIAGNOSIS — J209 Acute bronchitis, unspecified: Secondary | ICD-10-CM

## 2013-03-23 MED ORDER — BENZONATATE 200 MG PO CAPS: 200.0000 mg | ORAL_CAPSULE | Freq: Three times a day (TID) | ORAL | Status: DC | PRN

## 2013-03-23 MED ORDER — FLUTICASONE PROPIONATE 50 MCG/ACT NA SUSP: 2.0000 | Freq: Every day | NASAL | Status: DC

## 2013-03-23 MED ORDER — AZITHROMYCIN 500 MG PO TABS: 500.0000 mg | ORAL_TABLET | Freq: Every day | ORAL | Status: DC

## 2013-03-23 NOTE — Patient Instructions (Signed)
Nice to meet you flonase for your nose for the next month Tessalon perles for the cough Teaspoon of honey for cough can help as well.  azithro daily for 3 days  Bronchitis Bronchitis is the body's way of reacting to injury and/or infection (inflammation) of the bronchi. Bronchi are the air tubes that extend from the windpipe into the lungs. If the inflammation becomes severe, it may cause shortness of breath. CAUSES  Inflammation may be caused by:  A virus.  Germs (bacteria).  Dust.  Allergens.  Pollutants and many other irritants. The cells lining the bronchial tree are covered with tiny hairs (cilia). These constantly beat upward, away from the lungs, toward the mouth. This keeps the lungs free of pollutants. When these cells become too irritated and are unable to do their job, mucus begins to develop. This causes the characteristic cough of bronchitis. The cough clears the lungs when the cilia are unable to do their job. Without either of these protective mechanisms, the mucus would settle in the lungs. Then you would develop pneumonia. Smoking is a common cause of bronchitis and can contribute to pneumonia. Stopping this habit is the single most important thing you can do to help yourself. TREATMENT   Your caregiver may prescribe an antibiotic if the cough is caused by bacteria. Also, medicines that open up your airways make it easier to breathe. Your caregiver may also recommend or prescribe an expectorant. It will loosen the mucus to be coughed up. Only take over-the-counter or prescription medicines for pain, discomfort, or fever as directed by your caregiver.  Removing whatever causes the problem (smoking, for example) is critical to preventing the problem from getting worse.  Cough suppressants may be prescribed for relief of cough symptoms.  Inhaled medicines may be prescribed to help with symptoms now and to help prevent problems from returning.  For those with recurrent  (chronic) bronchitis, there may be a need for steroid medicines. SEEK IMMEDIATE MEDICAL CARE IF:   During treatment, you develop more pus-like mucus (purulent sputum).  You have a fever.  You become progressively more ill.  You have increased difficulty breathing, wheezing, or shortness of breath. It is necessary to seek immediate medical care if you are elderly or sick from any other disease. MAKE SURE YOU:   Understand these instructions.  Will watch your condition.  Will get help right away if you are not doing well or get worse. Document Released: 03/03/2005 Document Revised: 11/03/2012 Document Reviewed: 10/26/2012 [facility] Patient Information 2014 [place].

## 2013-03-23 NOTE — Progress Notes (Signed)
SUBJECTIVE:  Paige Hester is a 58 y.o. female who complains of congestion, sneezing, sore throat, swollen glands, productive cough, cough described as productive of yellow and green sputum, bilateral sinus pain and chills for 5 days. She denies a history of chest pain, dizziness, fatigue, shortness of breath, weakness and weight loss and denies a history of asthma. Patient denies smoke cigarettes. + sick contacts, was given flu shot.   Past medical history, social, surgical and family history all reviewed.   Denies fever, chills, nausea vomiting abdominal pain, dysuria, chest pain, shortness of breath dyspnea on exertion or numbness in extremities    OBJECTIVE: She appears well, vital signs are as noted. Ears normal.  Throat and pharynx normal.  Neck supple. No adenopathy in the neck. Nose is congested. Sinuses non tender. The chest is clear, without wheezes or rales.  ASSESSMENT:  viral upper respiratory illness and bronchitis  PLAN: Symptomatic therapy suggested: push fluids, rest and return office visit prn if symptoms persist or worsen. Lack of antibiotic effectiveness discussed with her. Call or return to clinic prn if these symptoms worsen or fail to improve as anticipated.

## 2013-04-07 ENCOUNTER — Other Ambulatory Visit: Payer: Self-pay | Admitting: Internal Medicine

## 2013-05-31 ENCOUNTER — Ambulatory Visit (INDEPENDENT_AMBULATORY_CARE_PROVIDER_SITE_OTHER): Payer: Federal, State, Local not specified - PPO | Admitting: Internal Medicine

## 2013-05-31 ENCOUNTER — Encounter: Payer: Self-pay | Admitting: Internal Medicine

## 2013-05-31 VITALS — BP 150/80 | HR 84 | Temp 96.7°F | Resp 16 | Wt 246.0 lb

## 2013-05-31 DIAGNOSIS — I10 Essential (primary) hypertension: Secondary | ICD-10-CM

## 2013-05-31 DIAGNOSIS — K219 Gastro-esophageal reflux disease without esophagitis: Secondary | ICD-10-CM

## 2013-05-31 DIAGNOSIS — D509 Iron deficiency anemia, unspecified: Secondary | ICD-10-CM

## 2013-05-31 MED ORDER — POTASSIUM CHLORIDE CRYS ER 20 MEQ PO TBCR: 20.0000 meq | EXTENDED_RELEASE_TABLET | Freq: Every day | ORAL | Status: DC

## 2013-05-31 MED ORDER — PANTOPRAZOLE SODIUM 40 MG PO TBEC: 40.0000 mg | DELAYED_RELEASE_TABLET | Freq: Every day | ORAL | Status: DC

## 2013-05-31 MED ORDER — VALSARTAN-HYDROCHLOROTHIAZIDE 320-25 MG PO TABS: 1.0000 | ORAL_TABLET | Freq: Every day | ORAL | Status: DC

## 2013-05-31 NOTE — Progress Notes (Signed)
   Subjective:    HPI    F/u HTN - states BP is nl at home F/u RUQ pain - better and GB polyps - no relapse (doesn't want surgery) C/o wt gain  Review of Systems  Constitutional: Negative for chills, activity change, appetite change, fatigue and unexpected weight change.  HENT: Negative for congestion, mouth sores and sinus pressure.   Eyes: Negative for visual disturbance.  Respiratory: Negative for cough and chest tightness.   Gastrointestinal: Negative for nausea and abdominal pain.  Genitourinary: Negative for frequency, difficulty urinating and vaginal pain.  Musculoskeletal: Negative for back pain and gait problem.  Skin: Negative for pallor and rash.  Neurological: Negative for dizziness, tremors, weakness, numbness and headaches.  Psychiatric/Behavioral: Negative for confusion and sleep disturbance.   BP Readings from Last 3 Encounters:  05/31/13 150/80  03/23/13 148/72  01/11/13 150/80   Wt Readings from Last 3 Encounters:  05/31/13 246 lb (111.585 kg)  03/23/13 243 lb 1.3 oz (110.26 kg)  12/27/12 244 lb 12.8 oz (111.041 kg)      Objective:   Physical Exam  Constitutional: She appears well-developed. No distress.  obese  HENT:  Head: Normocephalic.  Right Ear: External ear normal.  Left Ear: External ear normal.  Nose: Nose normal.  Mouth/Throat: Oropharynx is clear and moist.  Eyes: Conjunctivae are normal. Pupils are equal, round, and reactive to light. Right eye exhibits no discharge. Left eye exhibits no discharge.  Neck: Normal range of motion. Neck supple. No JVD present. No tracheal deviation present. No thyromegaly present.  Cardiovascular: Normal rate and regular rhythm.   Murmur (2/6) heard. Pulmonary/Chest: No stridor. No respiratory distress. She has no wheezes.  Abdominal: Soft. Bowel sounds are normal. She exhibits no distension and no mass. There is no tenderness. There is no rebound and no guarding.  Musculoskeletal: She exhibits edema  (trace B). She exhibits no tenderness.  Lymphadenopathy:    She has no cervical adenopathy.  Neurological: She displays normal reflexes. No cranial nerve deficit. She exhibits normal muscle tone. Coordination normal.  Skin: No rash noted. No erythema.  Psychiatric: She has a normal mood and affect. Her behavior is normal. Judgment and thought content normal.   EKG  ECHO 2010 -- ok  Lab Results  Component Value Date   WBC 7.0 11/30/2012   HGB 12.4 11/30/2012   HCT 37.1 11/30/2012   PLT 325.0 11/30/2012   GLUCOSE 95 11/30/2012   CHOL 178 05/31/2012   TRIG 95.0 05/31/2012   HDL 66.00 05/31/2012   LDLCALC 93 05/31/2012   ALT 16 05/31/2012   AST 21 05/31/2012   NA 139 11/30/2012   K 3.8 11/30/2012   CL 103 11/30/2012   CREATININE 0.8 11/30/2012   BUN 11 11/30/2012   CO2 31 11/30/2012   TSH 1.51 11/30/2012          Assessment & Plan:

## 2013-05-31 NOTE — Progress Notes (Signed)
Pre visit review using our clinic review tool, if applicable. No additional management support is needed unless otherwise documented below in the visit note. 

## 2013-05-31 NOTE — Assessment & Plan Note (Signed)
labs

## 2013-05-31 NOTE — Assessment & Plan Note (Addendum)
See Rx change - diovan HCT Loose wt

## 2013-05-31 NOTE — Assessment & Plan Note (Addendum)
Continue with current prescription therapy as reflected on the Med list. Loose wt Start Protonix

## 2013-06-01 ENCOUNTER — Telehealth: Payer: Self-pay | Admitting: Internal Medicine

## 2013-06-01 NOTE — Telephone Encounter (Signed)
Relevant patient education assigned to patient using Emmi. ° °

## 2013-06-27 ENCOUNTER — Encounter: Payer: Self-pay | Admitting: Physician Assistant

## 2013-06-27 ENCOUNTER — Ambulatory Visit (INDEPENDENT_AMBULATORY_CARE_PROVIDER_SITE_OTHER): Payer: Federal, State, Local not specified - PPO | Admitting: Physician Assistant

## 2013-06-27 ENCOUNTER — Ambulatory Visit
Admission: RE | Admit: 2013-06-27 | Discharge: 2013-06-27 | Disposition: A | Discharge status: Home / Self Care | Payer: Federal, State, Local not specified - PPO | Source: Ambulatory Visit | Attending: Physician Assistant | Admitting: Physician Assistant

## 2013-06-27 VITALS — BP 172/80 | HR 81 | Temp 98.4°F | Resp 18 | Ht 65.0 in | Wt 241.4 lb

## 2013-06-27 DIAGNOSIS — R1031 Right lower quadrant pain: Secondary | ICD-10-CM

## 2013-06-27 DIAGNOSIS — A088 Other specified intestinal infections: Secondary | ICD-10-CM

## 2013-06-27 DIAGNOSIS — I10 Essential (primary) hypertension: Secondary | ICD-10-CM

## 2013-06-27 DIAGNOSIS — A084 Viral intestinal infection, unspecified: Secondary | ICD-10-CM

## 2013-06-27 LAB — COMPREHENSIVE METABOLIC PANEL
ALBUMIN: 3.6 g/dL (ref 3.5–5.2)
ALT: 13 U/L (ref 0–35)
AST: 16 U/L (ref 0–37)
Alkaline Phosphatase: 75 U/L (ref 39–117)
BUN: 11 mg/dL (ref 6–23)
CHLORIDE: 103 meq/L (ref 96–112)
CO2: 28 mEq/L (ref 19–32)
Calcium: 9.4 mg/dL (ref 8.4–10.5)
Creatinine, Ser: 0.7 mg/dL (ref 0.4–1.2)
GFR: 120.22 mL/min (ref >60.00)
Glucose, Bld: 93 mg/dL (ref 70–99)
Potassium: 3.1 mEq/L — ABNORMAL LOW (ref 3.5–5.1)
Sodium: 140 mEq/L (ref 135–145)
TOTAL PROTEIN: 7.4 g/dL (ref 6.0–8.3)
Total Bilirubin: 0.4 mg/dL (ref 0.3–1.2)

## 2013-06-27 LAB — CBC WITH DIFFERENTIAL/PLATELET
BASOS ABS: 0.1 10*3/uL (ref 0.0–0.1)
BASOS PCT: 0.9 % (ref 0.0–3.0)
Eosinophils Absolute: 0.4 10*3/uL (ref 0.0–0.7)
Eosinophils Relative: 6 % — ABNORMAL HIGH (ref 0.0–5.0)
HEMATOCRIT: 37.3 % (ref 36.0–46.0)
HEMOGLOBIN: 12.3 g/dL (ref 12.0–15.0)
LYMPHS ABS: 2.6 10*3/uL (ref 0.7–4.0)
Lymphocytes Relative: 43.1 % (ref 12.0–46.0)
MCHC: 33 g/dL (ref 30.0–36.0)
MCV: 83.8 fl (ref 78.0–100.0)
MONO ABS: 0.5 10*3/uL (ref 0.1–1.0)
Monocytes Relative: 8.3 % (ref 3.0–12.0)
NEUTROS ABS: 2.5 10*3/uL (ref 1.4–7.7)
Neutrophils Relative %: 41.7 % — ABNORMAL LOW (ref 43.0–77.0)
Platelets: 320 10*3/uL (ref 150.0–400.0)
RBC: 4.45 Mil/uL (ref 3.87–5.11)
RDW: 14 % (ref 11.5–14.6)
WBC: 6 10*3/uL (ref 4.5–10.5)

## 2013-06-27 MED ORDER — ONDANSETRON 8 MG PO TBDP: 8.0000 mg | ORAL_TABLET | Freq: Three times a day (TID) | ORAL | Status: DC | PRN

## 2013-06-27 NOTE — Assessment & Plan Note (Signed)
Restart medications.  Take daily.  Follow-up for BP recheck with PCP in 2 weeks.

## 2013-06-27 NOTE — Assessment & Plan Note (Signed)
Will obtain US to help r/o pathology.  Likely tenderness is secondary to viral gastroenteritis.

## 2013-06-27 NOTE — Patient Instructions (Signed)
Please obtain labs.  I will call you with your results.  Wait at lab to speak with [place] for instructions on scheduling your ultrasound.  Increase fluid intake. Restart potassium supplement.  Restart blood pressure medication. Read diet below. Introduce solid foods as tolerated.  If you develop severe abdominal pain, severe vomiting or cannot tolerate fluids by mouth, please proceed to the ER.  Viral Gastroenteritis Viral gastroenteritis is also known as stomach flu. This condition affects the stomach and intestinal tract. It can cause sudden diarrhea and vomiting. The illness typically lasts 3 to 8 days. Most people develop an immune response that eventually gets rid of the virus. While this natural response develops, the virus can make you quite ill. CAUSES  Many different viruses can cause gastroenteritis, such as rotavirus or noroviruses. You can catch one of these viruses by consuming contaminated food or water. You may also catch a virus by sharing utensils or other personal items with an infected person or by touching a contaminated surface. SYMPTOMS  The most common symptoms are diarrhea and vomiting. These problems can cause a severe loss of body fluids (dehydration) and a body salt (electrolyte) imbalance. Other symptoms may include:  Fever.  Headache.  Fatigue.  Abdominal pain. DIAGNOSIS  Your caregiver can usually diagnose viral gastroenteritis based on your symptoms and a physical exam. A stool sample may also be taken to test for the presence of viruses or other infections. TREATMENT  This illness typically goes away on its own. Treatments are aimed at rehydration. The most serious cases of viral gastroenteritis involve vomiting so severely that you are not able to keep fluids down. In these cases, fluids must be given through an intravenous line (IV). HOME CARE INSTRUCTIONS   Drink enough fluids to keep your urine clear or pale yellow. Drink small amounts of fluids frequently  and increase the amounts as tolerated.  Ask your caregiver for specific rehydration instructions.  Avoid:  Foods high in sugar.  Alcohol.  Carbonated drinks.  Tobacco.  Juice.  Caffeine drinks.  Extremely hot or cold fluids.  Fatty, greasy foods.  Too much intake of anything at one time.  Dairy products until 24 to 48 hours after diarrhea stops.  You may consume probiotics. Probiotics are active cultures of beneficial bacteria. They may lessen the amount and number of diarrheal stools in adults. Probiotics can be found in yogurt with active cultures and in supplements.  Wash your hands well to avoid spreading the virus.  Only take over-the-counter or prescription medicines for pain, discomfort, or fever as directed by your caregiver. Do not give aspirin to children. Antidiarrheal medicines are not recommended.  Ask your caregiver if you should continue to take your regular prescribed and over-the-counter medicines.  Keep all follow-up appointments as directed by your caregiver. SEEK IMMEDIATE MEDICAL CARE IF:   You are unable to keep fluids down.  You do not urinate at least once every 6 to 8 hours.  You develop shortness of breath.  You notice blood in your stool or vomit. This may look like coffee grounds.  You have abdominal pain that increases or is concentrated in one small area (localized).  You have persistent vomiting or diarrhea.  You have a fever.  The patient is a child younger than 3 months, and he or she has a fever.  The patient is a child older than 3 months, and he or she has a fever and persistent symptoms.  The patient is a child older than  3 months, and he or she has a fever and symptoms suddenly get worse.  The patient is a baby, and he or she has no tears when crying. MAKE SURE YOU:   Understand these instructions.  Will watch your condition.  Will get help right away if you are not doing well or get worse. Document Released:  03/03/2005 Document Revised: 05/26/2011 Document Reviewed: 12/18/2010 [facility] Patient Information 2014 [place].

## 2013-06-27 NOTE — Assessment & Plan Note (Signed)
Symptoms seem viral in nature.  Will check CBC and BMP.  Rx zofran for nausea.  Increase fluid.  BRAT diet.  Restart potassium supplement.  Will obtain US due to RLQ pain.

## 2013-06-27 NOTE — Progress Notes (Signed)
Patient presents to clinic today c/o N/V/D that began ~ 4 days ago.  Patient endorses several episodes of non-bloody emesis initially that have slowed down infrequency and severity.  Last episode was 2 am this morning.  Also endorses some loose stools.  Denies fever, chills, tenesmus, melena or hematochezia.  Patient denies recent travel, sick contact, or new water/food source. Is able to tolerate PO fluids and foods, but with lingering nausea.  Has noticed some RLQ abdominal tenderness.  Patient's BP is elevated at visit.  Asymptomatic.  Patient has not taken her BP medication today.  Also has not taken her potassium supplement in a week or so.  No hx of diabetes.  Past Medical History  Diagnosis Date  . Hypertension   . Menopausal symptoms     since 2007  . Vitamin D deficiency   . Gallbladder polyp 2012    Current Outpatient Prescriptions on File Prior to Visit  Medication Sig Dispense Refill  . aspirin (BAYER ASPIRIN) 325 MG tablet Take 1 tablet (325 mg total) by mouth daily.  100 tablet  3  . Cholecalciferol (VITAMIN D3) 1000 UNITS tablet Take 1,000 Units by mouth daily.        . fluticasone (FLONASE) 50 MCG/ACT nasal spray Place 2 sprays into both nostrils daily.  16 g  6  . pantoprazole (PROTONIX) 40 MG tablet Take 1 tablet (40 mg total) by mouth daily. Take ac  90 tablet  3  . valsartan-hydrochlorothiazide (DIOVAN-HCT) 320-25 MG per tablet Take 1 tablet by mouth daily.  90 tablet  3  . verapamil (VERELAN PM) 360 MG 24 hr capsule TAKE ONE CAPSULE BY MOUTH AT BEDTIME  30 capsule  5  . potassium chloride SA (K-DUR,KLOR-CON) 20 MEQ tablet Take 1 tablet (20 mEq total) by mouth daily.  90 tablet  3   No current facility-administered medications on file prior to visit.    No Known Allergies  Family History  Problem Relation Age of Onset  . Heart disease Mother   . Hypertension Other   . Colon cancer Other   . Prostate cancer Other   . Cancer Father   . Heart disease Father 67   CAD, STENT  . Heart disease Brother     History   Social History  . Marital Status: Married    Spouse Name: N/A    Number of Children: N/A  . Years of Education: N/A   Social History Main Topics  . Smoking status: Former Research scientist (life sciences)  . Smokeless tobacco: Never Used  . Alcohol Use: 4.2 oz/week    7 Glasses of wine per week  . Drug Use: No  . Sexual Activity: Yes   Other Topics Concern  . None   Social History Narrative  . None   Review of Systems - See HPI.  All other ROS are negative.  BP 172/80  Pulse 81  Temp(Src) 98.4 F (36.9 C) (Oral)  Resp 18  Ht 5\' 5"  (1.651 m)  Wt 241 lb 6 oz (109.487 kg)  BMI 40.17 kg/m2  SpO2 96%  Physical Exam  Vitals reviewed. Constitutional: She is oriented to person, place, and time and well-developed, well-nourished, and in no distress.  HENT:  Head: Normocephalic and atraumatic.  Right Ear: External ear normal.  Left Ear: External ear normal.  Nose: Nose normal.  Mouth/Throat: Oropharynx is clear and moist. No oropharyngeal exudate.  Eyes: Conjunctivae are normal. Pupils are equal, round, and reactive to light.  Neck: Neck supple.  Cardiovascular:  Normal rate, regular rhythm, normal heart sounds and intact distal pulses.   Pulmonary/Chest: Effort normal and breath sounds normal. No respiratory distress. She has no wheezes. She has no rales. She exhibits no tenderness.  Abdominal: Soft. Bowel sounds are normal. She exhibits no distension and no mass. There is no rebound and no guarding.  Bowel sounds mildly hyperactive but present in all 4 Qs.  RLQ tenderness with palpation without rebound tenderness.  + Obturator sign.  Negative iliopsoas and McBurney point signs.  Lymphadenopathy:    She has no cervical adenopathy.  Neurological: She is alert and oriented to person, place, and time.  Skin: Skin is warm and dry. No rash noted.  Psychiatric: Affect normal.   Assessment/Plan: Viral gastroenteritis Symptoms seem viral in nature.   Will check CBC and BMP.  Rx zofran for nausea.  Increase fluid.  BRAT diet.  Restart potassium supplement.  Will obtain US due to RLQ pain.    RLQ abdominal pain Will obtain US to help r/o pathology.  Likely tenderness is secondary to viral gastroenteritis.  HYPERTENSION Restart medications.  Take daily.  Follow-up for BP recheck with PCP in 2 weeks.

## 2013-07-04 ENCOUNTER — Encounter: Payer: Self-pay | Admitting: Internal Medicine

## 2013-07-04 ENCOUNTER — Ambulatory Visit (INDEPENDENT_AMBULATORY_CARE_PROVIDER_SITE_OTHER): Payer: Federal, State, Local not specified - PPO | Admitting: Internal Medicine

## 2013-07-04 VITALS — BP 142/82 | HR 88 | Temp 97.0°F | Resp 16 | Wt 244.0 lb

## 2013-07-04 DIAGNOSIS — F4321 Adjustment disorder with depressed mood: Secondary | ICD-10-CM | POA: Insufficient documentation

## 2013-07-04 MED ORDER — FLUOXETINE HCL 10 MG PO TABS: 10.0000 mg | ORAL_TABLET | Freq: Every day | ORAL | Status: DC

## 2013-07-04 MED ORDER — CLONAZEPAM 0.25 MG PO TBDP: 0.2500 mg | ORAL_TABLET | Freq: Two times a day (BID) | ORAL | Status: DC | PRN

## 2013-07-04 NOTE — Assessment & Plan Note (Signed)
4/15 stress w/husband, son Fluoxetine Clonazepam prn She will see a counselor tomorrow

## 2013-07-04 NOTE — Progress Notes (Signed)
   Subjective:    HPI   C/o stress: marital problems F/u HTN - states BP is nl at home F/u RUQ pain - better and GB polyps - no relapse (doesn't want surgery) C/o wt gain  Review of Systems  Constitutional: Negative for chills, activity change, appetite change, fatigue and unexpected weight change.  HENT: Negative for congestion, mouth sores and sinus pressure.   Eyes: Negative for visual disturbance.  Respiratory: Negative for cough and chest tightness.   Gastrointestinal: Negative for nausea and abdominal pain.  Genitourinary: Negative for frequency, difficulty urinating and vaginal pain.  Musculoskeletal: Negative for back pain and gait problem.  Skin: Negative for pallor and rash.  Neurological: Negative for dizziness, tremors, weakness, numbness and headaches.  Psychiatric/Behavioral: Positive for sleep disturbance and decreased concentration. Negative for suicidal ideas and confusion. The patient is nervous/anxious.    BP Readings from Last 3 Encounters:  07/04/13 142/82  06/27/13 172/80  05/31/13 150/80   Wt Readings from Last 3 Encounters:  07/04/13 244 lb (110.678 kg)  06/27/13 241 lb 6 oz (109.487 kg)  05/31/13 246 lb (111.585 kg)      Objective:   Physical Exam  Constitutional: She appears well-developed. No distress.  obese  HENT:  Head: Normocephalic.  Right Ear: External ear normal.  Left Ear: External ear normal.  Nose: Nose normal.  Mouth/Throat: Oropharynx is clear and moist.  Eyes: Conjunctivae are normal. Pupils are equal, round, and reactive to light. Right eye exhibits no discharge. Left eye exhibits no discharge.  Neck: Normal range of motion. Neck supple. No JVD present. No tracheal deviation present. No thyromegaly present.  Cardiovascular: Normal rate and regular rhythm.   Murmur (2/6) heard. Pulmonary/Chest: No stridor. No respiratory distress. She has no wheezes.  Abdominal: Soft. Bowel sounds are normal. She exhibits no distension and no  mass. There is no tenderness. There is no rebound and no guarding.  Musculoskeletal: She exhibits edema (trace B). She exhibits no tenderness.  Lymphadenopathy:    She has no cervical adenopathy.  Neurological: She displays normal reflexes. No cranial nerve deficit. She exhibits normal muscle tone. Coordination normal.  Skin: No rash noted. No erythema.  Psychiatric: She has a normal mood and affect. Judgment and thought content normal.  Tearful, sad   EKG  ECHO 2010 -- ok  Lab Results  Component Value Date   WBC 6.0 06/27/2013   HGB 12.3 06/27/2013   HCT 37.3 06/27/2013   PLT 320.0 06/27/2013   GLUCOSE 93 06/27/2013   CHOL 178 05/31/2012   TRIG 95.0 05/31/2012   HDL 66.00 05/31/2012   LDLCALC 93 05/31/2012   ALT 13 06/27/2013   AST 16 06/27/2013   NA 140 06/27/2013   K 3.1* 06/27/2013   CL 103 06/27/2013   CREATININE 0.7 06/27/2013   BUN 11 06/27/2013   CO2 28 06/27/2013   TSH 1.51 11/30/2012          Assessment & Plan:

## 2013-07-04 NOTE — Progress Notes (Signed)
Pre visit review using our clinic review tool, if applicable. No additional management support is needed unless otherwise documented below in the visit note. 

## 2013-07-15 ENCOUNTER — Telehealth: Payer: Self-pay | Admitting: *Deleted

## 2013-07-15 NOTE — Telephone Encounter (Signed)
Pt called requesting extension on Work note.  Please advise

## 2013-07-18 NOTE — Telephone Encounter (Signed)
OK. Thx

## 2013-07-19 NOTE — Telephone Encounter (Signed)
How would you like the work note to read.  Please advise

## 2013-07-19 NOTE — Telephone Encounter (Signed)
Pls ask pt what dates she needs covered on the note Thx

## 2013-07-20 NOTE — Telephone Encounter (Signed)
Left message on VM requesting dates needed on work note and for a return call to [facility].

## 2013-07-21 ENCOUNTER — Telehealth: Payer: Self-pay | Admitting: *Deleted

## 2013-07-21 NOTE — Telephone Encounter (Signed)
OK  OV end of May pls Thx

## 2013-07-21 NOTE — Telephone Encounter (Signed)
Pt returned call she is requesting 07/18/2013 through 08/15/2013 off from work.  Please advise

## 2013-07-25 ENCOUNTER — Encounter: Payer: Self-pay | Admitting: *Deleted

## 2013-07-25 NOTE — Telephone Encounter (Signed)
Spoke with pt advised work note complete.

## 2013-08-10 ENCOUNTER — Encounter: Payer: Self-pay | Admitting: Internal Medicine

## 2013-08-10 ENCOUNTER — Ambulatory Visit (INDEPENDENT_AMBULATORY_CARE_PROVIDER_SITE_OTHER): Payer: Federal, State, Local not specified - PPO | Admitting: Internal Medicine

## 2013-08-10 VITALS — BP 144/70 | HR 72 | Temp 98.3°F | Resp 16 | Wt 244.0 lb

## 2013-08-10 DIAGNOSIS — E669 Obesity, unspecified: Secondary | ICD-10-CM

## 2013-08-10 DIAGNOSIS — F4321 Adjustment disorder with depressed mood: Secondary | ICD-10-CM

## 2013-08-10 DIAGNOSIS — I10 Essential (primary) hypertension: Secondary | ICD-10-CM

## 2013-08-10 NOTE — Assessment & Plan Note (Signed)
Wt Readings from Last 3 Encounters:  08/10/13 244 lb (110.678 kg)  07/04/13 244 lb (110.678 kg)  06/27/13 241 lb 6 oz (109.487 kg)

## 2013-08-10 NOTE — Progress Notes (Signed)
   Subjective:    HPI   f/u stress: marital problems; pt is planning to retire in 7/15 F/u HTN - states BP is nl at home F/u RUQ pain - better and GB polyps - no relapse (doesn't want surgery) C/o wt gain  Review of Systems  Constitutional: Negative for chills, activity change, appetite change, fatigue and unexpected weight change.  HENT: Negative for congestion, mouth sores and sinus pressure.   Eyes: Negative for visual disturbance.  Respiratory: Negative for cough and chest tightness.   Gastrointestinal: Negative for nausea and abdominal pain.  Genitourinary: Negative for frequency, difficulty urinating and vaginal pain.  Musculoskeletal: Negative for back pain and gait problem.  Skin: Negative for pallor and rash.  Neurological: Negative for dizziness, tremors, weakness, numbness and headaches.  Psychiatric/Behavioral: Positive for sleep disturbance and decreased concentration. Negative for suicidal ideas and confusion. The patient is nervous/anxious.    BP Readings from Last 3 Encounters:  08/10/13 144/70  07/04/13 142/82  06/27/13 172/80   Wt Readings from Last 3 Encounters:  08/10/13 244 lb (110.678 kg)  07/04/13 244 lb (110.678 kg)  06/27/13 241 lb 6 oz (109.487 kg)      Objective:   Physical Exam  Constitutional: She appears well-developed. No distress.  obese  HENT:  Head: Normocephalic.  Right Ear: External ear normal.  Left Ear: External ear normal.  Nose: Nose normal.  Mouth/Throat: Oropharynx is clear and moist.  Eyes: Conjunctivae are normal. Pupils are equal, round, and reactive to light. Right eye exhibits no discharge. Left eye exhibits no discharge.  Neck: Normal range of motion. Neck supple. No JVD present. No tracheal deviation present. No thyromegaly present.  Cardiovascular: Normal rate and regular rhythm.   Murmur (2/6) heard. Pulmonary/Chest: No stridor. No respiratory distress. She has no wheezes.  Abdominal: Soft. Bowel sounds are normal.  She exhibits no distension and no mass. There is no tenderness. There is no rebound and no guarding.  Musculoskeletal: She exhibits edema (trace B). She exhibits no tenderness.  Lymphadenopathy:    She has no cervical adenopathy.  Neurological: She displays normal reflexes. No cranial nerve deficit. She exhibits normal muscle tone. Coordination normal.  Skin: No rash noted. No erythema.  Psychiatric: She has a normal mood and affect. Judgment and thought content normal.  Tearful, sad   EKG  ECHO 2010 -- ok  Lab Results  Component Value Date   WBC 6.0 06/27/2013   HGB 12.3 06/27/2013   HCT 37.3 06/27/2013   PLT 320.0 06/27/2013   GLUCOSE 93 06/27/2013   CHOL 178 05/31/2012   TRIG 95.0 05/31/2012   HDL 66.00 05/31/2012   LDLCALC 93 05/31/2012   ALT 13 06/27/2013   AST 16 06/27/2013   NA 140 06/27/2013   K 3.1* 06/27/2013   CL 103 06/27/2013   CREATININE 0.7 06/27/2013   BUN 11 06/27/2013   CO2 28 06/27/2013   TSH 1.51 11/30/2012          Assessment & Plan:

## 2013-08-10 NOTE — Progress Notes (Signed)
Pre visit review using our clinic review tool, if applicable. No additional management support is needed unless otherwise documented below in the visit note. 

## 2013-08-10 NOTE — Assessment & Plan Note (Signed)
Continue with current prescription therapy as reflected on the Med list.  

## 2013-08-10 NOTE — Assessment & Plan Note (Signed)
To work 08/15/13 Out of work since 07/04/13 Cont Fluoxetine

## 2013-08-11 ENCOUNTER — Ambulatory Visit: Payer: Federal, State, Local not specified - PPO | Admitting: Internal Medicine

## 2013-12-19 ENCOUNTER — Other Ambulatory Visit: Payer: Self-pay | Admitting: Internal Medicine

## 2013-12-30 ENCOUNTER — Other Ambulatory Visit: Payer: Self-pay

## 2014-07-21 ENCOUNTER — Other Ambulatory Visit: Payer: Self-pay | Admitting: Internal Medicine

## 2014-08-03 ENCOUNTER — Encounter: Payer: Self-pay | Admitting: Family Medicine

## 2014-08-03 ENCOUNTER — Ambulatory Visit (INDEPENDENT_AMBULATORY_CARE_PROVIDER_SITE_OTHER): Payer: Federal, State, Local not specified - PPO | Admitting: Family Medicine

## 2014-08-03 VITALS — BP 124/60 | HR 66 | Temp 98.4°F | Wt 257.8 lb

## 2014-08-03 DIAGNOSIS — R059 Cough, unspecified: Secondary | ICD-10-CM

## 2014-08-03 DIAGNOSIS — R05 Cough: Secondary | ICD-10-CM

## 2014-08-03 MED ORDER — ALBUTEROL SULFATE HFA 108 (90 BASE) MCG/ACT IN AERS: 1.0000 | INHALATION_SPRAY | Freq: Four times a day (QID) | RESPIRATORY_TRACT | Status: DC | PRN

## 2014-08-03 MED ORDER — DOXYCYCLINE HYCLATE 100 MG PO TABS: 100.0000 mg | ORAL_TABLET | Freq: Two times a day (BID) | ORAL | Status: DC

## 2014-08-03 NOTE — Progress Notes (Signed)
Pre visit review using our clinic review tool, if applicable. No additional management support is needed unless otherwise documented below in the visit note.  Sx stated about 9 days ago.  Scratchy throat.  Head congestion.  Some bloody mucous initially. Then had aches and fevers.  Missed some work recently.  Now with cough with yellow/green sputum.  Last night waking up wheezing.  Still with feverish spells intermittently.  No vomiting, no diarrhea.  No ear pain.  No ST now.  Voice is altered.  No rash.    Meds, vitals, and allergies reviewed.   ROS: See HPI.  Otherwise, noncontributory.  GEN: nad, alert and oriented HEENT: mucous membranes moist, tm w/o erythema, nasal exam w/o erythema, clear discharge noted,  OP with cobblestoning NECK: supple w/o LA CV: rrr.   Murmur noted PULM: ctab except for scant B rhonchi.  No wheeze, no inc wob EXT: no edema

## 2014-08-03 NOTE — Patient Instructions (Signed)
Start doxycyline and use the inhaler if needed.  Drink plenty of fluids, take tylenol as needed, and gargle with warm salt water for your throat.  This should gradually improve.  Take care.  Let us know if you have other concerns.

## 2014-08-04 ENCOUNTER — Other Ambulatory Visit: Payer: Self-pay | Admitting: Internal Medicine

## 2014-08-04 DIAGNOSIS — R059 Cough, unspecified: Secondary | ICD-10-CM | POA: Insufficient documentation

## 2014-08-04 DIAGNOSIS — R05 Cough: Secondary | ICD-10-CM | POA: Insufficient documentation

## 2014-08-04 DIAGNOSIS — R051 Acute cough: Secondary | ICD-10-CM | POA: Insufficient documentation

## 2014-08-04 NOTE — Assessment & Plan Note (Signed)
Likely bronchitis, would treat given duration and sx.  D/w pt.  Start doxy and use SABA with routine cautions.  She agrees.  Nontoxic.  See AVS.

## 2014-08-17 ENCOUNTER — Telehealth: Payer: Self-pay | Admitting: Internal Medicine

## 2014-08-17 NOTE — Telephone Encounter (Signed)
Pt would like to transfer to [place] because she has moved and this location is closer.  She will be making an appt for a physical with pcp, but after that she would like to transfer.  Is this ok with you? Best call back number is [phone], thanks.

## 2014-08-17 NOTE — Telephone Encounter (Signed)
That's fine, I'm happy to see her. Thanks 

## 2014-08-18 NOTE — Telephone Encounter (Signed)
OK w/me Thx 

## 2014-08-22 ENCOUNTER — Telehealth: Payer: Self-pay | Admitting: Internal Medicine

## 2014-08-22 MED ORDER — VERAPAMIL HCL ER 360 MG PO CP24: 360.0000 mg | ORAL_CAPSULE | Freq: Every day | ORAL | Status: DC

## 2014-08-22 NOTE — Telephone Encounter (Signed)
Rf sent. Left detailed mess informing pt.

## 2014-08-22 NOTE — Telephone Encounter (Signed)
Patient is needing a refill for verapamil (VERELAN PM) 360 MG 24 hr capsule [[id number]] . She is out of medication. She has a physical scheduled with you on 6/15 at 1:45. Pharmacy is [facility] on [address]. In [place]

## 2014-08-22 NOTE — Telephone Encounter (Signed)
New pt scheduled for 6/22

## 2014-08-30 ENCOUNTER — Encounter: Payer: Self-pay | Admitting: Internal Medicine

## 2014-08-30 ENCOUNTER — Ambulatory Visit (INDEPENDENT_AMBULATORY_CARE_PROVIDER_SITE_OTHER): Payer: Federal, State, Local not specified - PPO | Admitting: Internal Medicine

## 2014-08-30 VITALS — BP 130/80 | HR 59 | Ht 65.0 in | Wt 256.0 lb

## 2014-08-30 DIAGNOSIS — I10 Essential (primary) hypertension: Secondary | ICD-10-CM

## 2014-08-30 DIAGNOSIS — F4321 Adjustment disorder with depressed mood: Secondary | ICD-10-CM

## 2014-08-30 DIAGNOSIS — Z0000 Encounter for general adult medical examination without abnormal findings: Secondary | ICD-10-CM | POA: Diagnosis not present

## 2014-08-30 DIAGNOSIS — K219 Gastro-esophageal reflux disease without esophagitis: Secondary | ICD-10-CM

## 2014-08-30 NOTE — Assessment & Plan Note (Signed)
Chronic On Verapamil, Valsart - HCT

## 2014-08-30 NOTE — Assessment & Plan Note (Addendum)
We discussed age appropriate health related issues, including available/recomended screening tests and vaccinations. We discussed a need for adhering to healthy diet and exercise. Labs/EKG were reviewed/ordered. All questions were answered.  Labs PAP at [facility] Pt will sch mammo Zostavax adviced

## 2014-08-30 NOTE — Progress Notes (Signed)
Pre visit review using our clinic review tool, if applicable. No additional management support is needed unless otherwise documented below in the visit note. 

## 2014-08-30 NOTE — Assessment & Plan Note (Signed)
Much better 

## 2014-08-30 NOTE — Progress Notes (Signed)
   Subjective:    HPI  The patient is here for a wellness exam. The patient has been doing well overall without major physical or psychological issues going on lately. F/u stress: marital problems; pt retired in 7/15 from [employer]. Menopausal since 59 yo F/u HTN - states BP is nl at home F/u RUQ pain - better and GB polyps - no relapse (doesn't want surgery) C/o wt gain  Review of Systems  Constitutional: Negative for chills, activity change, appetite change, fatigue and unexpected weight change.  HENT: Negative for congestion, mouth sores and sinus pressure.   Eyes: Negative for visual disturbance.  Respiratory: Negative for cough and chest tightness.   Gastrointestinal: Negative for nausea and abdominal pain.  Genitourinary: Negative for frequency, difficulty urinating and vaginal pain.  Musculoskeletal: Negative for back pain and gait problem.  Skin: Negative for pallor and rash.  Neurological: Negative for dizziness, tremors, weakness, numbness and headaches.  Psychiatric/Behavioral: Positive for sleep disturbance and decreased concentration. Negative for suicidal ideas and confusion. The patient is nervous/anxious.    BP Readings from Last 3 Encounters:  08/30/14 130/80  08/03/14 124/60  08/10/13 144/70   Wt Readings from Last 3 Encounters:  08/30/14 256 lb (116.121 kg)  08/03/14 257 lb 12 oz (116.915 kg)  08/10/13 244 lb (110.678 kg)      Objective:   Physical Exam  Constitutional: She appears well-developed. No distress.  obese  HENT:  Head: Normocephalic.  Right Ear: External ear normal.  Left Ear: External ear normal.  Nose: Nose normal.  Mouth/Throat: Oropharynx is clear and moist.  Eyes: Conjunctivae are normal. Pupils are equal, round, and reactive to light. Right eye exhibits no discharge. Left eye exhibits no discharge.  Neck: Normal range of motion. Neck supple. No JVD present. No tracheal deviation present. No thyromegaly present.  Cardiovascular:  Normal rate and regular rhythm.   Murmur (2/6) heard. Pulmonary/Chest: No stridor. No respiratory distress. She has no wheezes.  Abdominal: Soft. Bowel sounds are normal. She exhibits no distension and no mass. There is no tenderness. There is no rebound and no guarding.  Musculoskeletal: She exhibits edema (trace B). She exhibits no tenderness.  Lymphadenopathy:    She has no cervical adenopathy.  Neurological: She displays normal reflexes. No cranial nerve deficit. She exhibits normal muscle tone. Coordination normal.  Skin: No rash noted. No erythema.  Psychiatric: She has a normal mood and affect. Judgment and thought content normal.  Tearful, sad   EKG  ECHO 2010 -- ok  Lab Results  Component Value Date   WBC 6.0 06/27/2013   HGB 12.3 06/27/2013   HCT 37.3 06/27/2013   PLT 320.0 06/27/2013   GLUCOSE 93 06/27/2013   CHOL 178 05/31/2012   TRIG 95.0 05/31/2012   HDL 66.00 05/31/2012   LDLCALC 93 05/31/2012   ALT 13 06/27/2013   AST 16 06/27/2013   NA 140 06/27/2013   K 3.1* 06/27/2013   CL 103 06/27/2013   CREATININE 0.7 06/27/2013   BUN 11 06/27/2013   CO2 28 06/27/2013   TSH 1.51 11/30/2012          Assessment & Plan:

## 2014-08-30 NOTE — Assessment & Plan Note (Signed)
Chronic  On Omeprazole 

## 2014-08-31 ENCOUNTER — Other Ambulatory Visit (INDEPENDENT_AMBULATORY_CARE_PROVIDER_SITE_OTHER): Payer: Federal, State, Local not specified - PPO

## 2014-08-31 DIAGNOSIS — K219 Gastro-esophageal reflux disease without esophagitis: Secondary | ICD-10-CM | POA: Diagnosis not present

## 2014-08-31 DIAGNOSIS — Z0000 Encounter for general adult medical examination without abnormal findings: Secondary | ICD-10-CM

## 2014-08-31 DIAGNOSIS — I10 Essential (primary) hypertension: Secondary | ICD-10-CM

## 2014-08-31 DIAGNOSIS — F4321 Adjustment disorder with depressed mood: Secondary | ICD-10-CM

## 2014-08-31 LAB — LIPID PANEL
CHOL/HDL RATIO: 3
CHOLESTEROL: 182 mg/dL (ref 0–200)
HDL: 52.5 mg/dL (ref >39.00)
LDL Cholesterol: 116 mg/dL — ABNORMAL HIGH (ref 0–99)
NonHDL: 129.5
Triglycerides: 70 mg/dL (ref 0.0–149.0)
VLDL: 14 mg/dL (ref 0.0–40.0)

## 2014-08-31 LAB — CBC WITH DIFFERENTIAL/PLATELET
Basophils Absolute: 0.1 10*3/uL (ref 0.0–0.1)
Basophils Relative: 0.6 % (ref 0.0–3.0)
EOS PCT: 4.7 % (ref 0.0–5.0)
Eosinophils Absolute: 0.4 10*3/uL (ref 0.0–0.7)
HCT: 37.3 % (ref 36.0–46.0)
HEMOGLOBIN: 12.3 g/dL (ref 12.0–15.0)
LYMPHS PCT: 38.7 % (ref 12.0–46.0)
Lymphs Abs: 3.5 10*3/uL (ref 0.7–4.0)
MCHC: 33 g/dL (ref 30.0–36.0)
MCV: 81.9 fl (ref 78.0–100.0)
MONO ABS: 0.7 10*3/uL (ref 0.1–1.0)
Monocytes Relative: 7.4 % (ref 3.0–12.0)
NEUTROS ABS: 4.4 10*3/uL (ref 1.4–7.7)
NEUTROS PCT: 48.6 % (ref 43.0–77.0)
Platelets: 357 10*3/uL (ref 150.0–400.0)
RBC: 4.56 Mil/uL (ref 3.87–5.11)
RDW: 14.9 % (ref 11.5–15.5)
WBC: 9.1 10*3/uL (ref 4.0–10.5)

## 2014-08-31 LAB — BASIC METABOLIC PANEL
BUN: 13 mg/dL (ref 6–23)
CALCIUM: 9.8 mg/dL (ref 8.4–10.5)
CO2: 30 mEq/L (ref 19–32)
Chloride: 101 mEq/L (ref 96–112)
Creatinine, Ser: 0.91 mg/dL (ref 0.40–1.20)
GFR: 81.2 mL/min (ref >60.00)
GLUCOSE: 102 mg/dL — AB (ref 70–99)
Potassium: 3.4 mEq/L — ABNORMAL LOW (ref 3.5–5.1)
Sodium: 138 mEq/L (ref 135–145)

## 2014-08-31 LAB — URINALYSIS, ROUTINE W REFLEX MICROSCOPIC
BILIRUBIN URINE: NEGATIVE
KETONES UR: NEGATIVE
Nitrite: NEGATIVE
PH: 5.5 (ref 5.0–8.0)
Specific Gravity, Urine: 1.02 (ref 1.000–1.030)
Total Protein, Urine: 30 — AB
URINE GLUCOSE: NEGATIVE
UROBILINOGEN UA: 0.2 (ref 0.0–1.0)

## 2014-08-31 LAB — HEPATIC FUNCTION PANEL
ALT: 10 U/L (ref 0–35)
AST: 16 U/L (ref 0–37)
Albumin: 4 g/dL (ref 3.5–5.2)
Alkaline Phosphatase: 75 U/L (ref 39–117)
BILIRUBIN DIRECT: 0 mg/dL (ref 0.0–0.3)
BILIRUBIN TOTAL: 0.3 mg/dL (ref 0.2–1.2)
Total Protein: 7.3 g/dL (ref 6.0–8.3)

## 2014-08-31 LAB — TSH: TSH: 3.16 u[IU]/mL (ref 0.35–4.50)

## 2014-09-06 ENCOUNTER — Encounter: Payer: Self-pay | Admitting: Primary Care

## 2014-09-06 ENCOUNTER — Ambulatory Visit (INDEPENDENT_AMBULATORY_CARE_PROVIDER_SITE_OTHER): Payer: Federal, State, Local not specified - PPO | Admitting: Primary Care

## 2014-09-06 VITALS — BP 122/64 | HR 70 | Temp 97.6°F | Ht 65.0 in | Wt 258.1 lb

## 2014-09-06 DIAGNOSIS — K219 Gastro-esophageal reflux disease without esophagitis: Secondary | ICD-10-CM

## 2014-09-06 DIAGNOSIS — R011 Cardiac murmur, unspecified: Secondary | ICD-10-CM

## 2014-09-06 DIAGNOSIS — F4321 Adjustment disorder with depressed mood: Secondary | ICD-10-CM

## 2014-09-06 DIAGNOSIS — R010 Benign and innocent cardiac murmurs: Secondary | ICD-10-CM | POA: Diagnosis not present

## 2014-09-06 DIAGNOSIS — I10 Essential (primary) hypertension: Secondary | ICD-10-CM | POA: Diagnosis not present

## 2014-09-06 NOTE — Progress Notes (Signed)
Pre visit review using our clinic review tool, if applicable. No additional management support is needed unless otherwise documented below in the visit note. 

## 2014-09-06 NOTE — Assessment & Plan Note (Signed)
Stable. Taking omeprazole 20 mg every other day. Continue same.

## 2014-09-06 NOTE — Patient Instructions (Signed)
Continue to drink plenty of water daily. The average female requires 2 liters daily.  Increase fiber in your diet to help with bowel movements. You may take Colace as needed to help soften stools. This may be purchased over the counter.  It was very nice meeting you!  High-Fiber Diet Fiber is found in fruits, vegetables, and grains. A high-fiber diet encourages the addition of more whole grains, legumes, fruits, and vegetables in your diet. The recommended amount of fiber for adult males is 38 g per day. For adult females, it is 25 g per day. Pregnant and lactating women should get 28 g of fiber per day. If you have a digestive or bowel problem, ask your caregiver for advice before adding high-fiber foods to your diet. Eat a variety of high-fiber foods instead of only a select few type of foods.  PURPOSE  To increase stool bulk.  To make bowel movements more regular to prevent constipation.  To lower cholesterol.  To prevent overeating. WHEN IS THIS DIET USED?  It may be used if you have constipation and hemorrhoids.  It may be used if you have uncomplicated diverticulosis (intestine condition) and irritable bowel syndrome.  It may be used if you need help with weight management.  It may be used if you want to add it to your diet as a protective measure against atherosclerosis, diabetes, and cancer. SOURCES OF FIBER  Whole-grain breads and cereals.  Fruits, such as apples, oranges, bananas, berries, prunes, and pears.  Vegetables, such as green peas, carrots, sweet potatoes, beets, broccoli, cabbage, spinach, and artichokes.  Legumes, such split peas, soy, lentils.  Almonds. FIBER CONTENT IN FOODS Starches and Grains / Dietary Fiber (g)  Cheerios, 1 cup / 3 g  Corn Flakes cereal, 1 cup / 0.7 g  Rice crispy treat cereal, 1 cup / 0.3 g  Instant oatmeal (cooked),  cup / 2 g  Frosted wheat cereal, 1 cup / 5.1 g  Brown, long-grain rice (cooked), 1 cup / 3.5 g  White,  long-grain rice (cooked), 1 cup / 0.6 g  Enriched macaroni (cooked), 1 cup / 2.5 g Legumes / Dietary Fiber (g)  Baked beans (canned, plain, or vegetarian),  cup / 5.2 g  Kidney beans (canned),  cup / 6.8 g  Pinto beans (cooked),  cup / 5.5 g Breads and Crackers / Dietary Fiber (g)  Plain or honey graham crackers, 2 squares / 0.7 g  Saltine crackers, 3 squares / 0.3 g  Plain, salted pretzels, 10 pieces / 1.8 g  Whole-wheat bread, 1 slice / 1.9 g  White bread, 1 slice / 0.7 g  Raisin bread, 1 slice / 1.2 g  Plain bagel, 3 oz / 2 g  Flour tortilla, 1 oz / 0.9 g  Corn tortilla, 1 small / 1.5 g  Hamburger or hotdog bun, 1 small / 0.9 g Fruits / Dietary Fiber (g)  Apple with skin, 1 medium / 4.4 g  Sweetened applesauce,  cup / 1.5 g  Banana,  medium / 1.5 g  Grapes, 10 grapes / 0.4 g  Orange, 1 small / 2.3 g  Raisin, 1.5 oz / 1.6 g  Melon, 1 cup / 1.4 g Vegetables / Dietary Fiber (g)  Green beans (canned),  cup / 1.3 g  Carrots (cooked),  cup / 2.3 g  Broccoli (cooked),  cup / 2.8 g  Peas (cooked),  cup / 4.4 g  Mashed potatoes,  cup / 1.6 g  Lettuce, 1  cup / 0.5 g  Corn (canned),  cup / 1.6 g  Tomato,  cup / 1.1 g Document Released: 03/03/2005 Document Revised: 09/02/2011 Document Reviewed: 06/05/2011 [employer] Patient Information 2015 [place]. This information is not intended to replace advice given to you by your health care provider. Make sure you discuss any questions you have with your health care provider.

## 2014-09-06 NOTE — Assessment & Plan Note (Signed)
Present on exam with radiation to neck. Last echo 2014 with EF of 65-70%.

## 2014-09-06 NOTE — Assessment & Plan Note (Signed)
Stable.  Continue current medications.

## 2014-09-06 NOTE — Progress Notes (Signed)
Subjective:    Patient ID: Paige Hester, female    DOB: 1955-08-06, 59 y.o.   MRN: [id number]  HPI  Paige Hester is a 59 year old female who presents today to establish care at our practice as she is transferring from the [place] office.   1) Hypertension: Diagnosed 20 years ago. She is managed on Diovan-HCT and verapamil. Denies headaches, dizziness, chest pain.  2) GERD: Diagnosed several years ago. She is taking omeprazole 20 mg every other day. If she doesn't take her medication then she'll experience belching and reflux of gastric contents.  3) Depression: Present in 2015 due to stress at work. Once managed on Fluoxitine for about 4 months. Her symptoms improved once she retired one year ago. Denies SI/HI, sadness.  Review of Systems  Constitutional: Negative for unexpected weight change.  HENT: Negative for rhinorrhea.   Respiratory: Negative for cough and shortness of breath.   Cardiovascular: Negative for chest pain.  Gastrointestinal: Negative for diarrhea.       More firm and smaller stools over the past several months.  Genitourinary: Negative for difficulty urinating.  Musculoskeletal: Negative for myalgias and arthralgias.  Skin: Negative for rash.  Neurological: Negative for dizziness and headaches.  Psychiatric/Behavioral:       Denies concerns for anxiety or depression       Past Medical History  Diagnosis Date  . Hypertension   . Menopausal symptoms     since 2007  . Vitamin D deficiency   . Gallbladder polyp 2012    History   Social History  . Marital Status: Married    Spouse Name: N/A  . Number of Children: N/A  . Years of Education: N/A   Occupational History  . Not on file.   Social History Main Topics  . Smoking status: Former Research scientist (life sciences)  . Smokeless tobacco: Never Used  . Alcohol Use: 4.2 oz/week    7 Glasses of wine per week  . Drug Use: No  . Sexual Activity: Yes   Other Topics Concern  . Not on file   Social History Narrative     Married.   1 child, 1 grandchild.   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   Enjoys going to [employer], singing.     History reviewed. No pertinent past surgical history.  Family History  Problem Relation Age of Onset  . Heart disease Mother   . Hypertension Mother   . Colon cancer Maternal Aunt   . Heart attack Mother 44  . Cancer Father     prostate  . Heart disease Father 56    CAD, STENT  . Heart disease Brother     Myocardial infarction  . Coronary artery disease Sister     No Known Allergies  Current Outpatient Prescriptions on File Prior to Visit  Medication Sig Dispense Refill  . aspirin (BAYER ASPIRIN) 325 MG tablet Take 1 tablet (325 mg total) by mouth daily. 100 tablet 3  . Cholecalciferol (VITAMIN D3) 1000 UNITS tablet Take 1,000 Units by mouth daily.      . fluticasone (FLONASE) 50 MCG/ACT nasal spray Place 2 sprays into both nostrils daily. 16 g 6  . omeprazole (PRILOSEC) 20 MG capsule Take 20 mg by mouth daily.    . valsartan-hydrochlorothiazide (DIOVAN-HCT) 320-25 MG per tablet TAKE ONE TABLET BY MOUTH ONCE DAILY 30 tablet 0  . verapamil (VERELAN PM) 360 MG 24 hr capsule Take 1 capsule (360 mg total) by  mouth at bedtime. 30 capsule 0   No current facility-administered medications on file prior to visit.    BP 122/64 mmHg  Pulse 70  Temp(Src) 97.6 F (36.4 C) (Oral)  Ht 5\' 5"  (1.651 m)  Wt 258 lb 1.9 oz (117.082 kg)  BMI 42.95 kg/m2  SpO2 98%    Objective:   Physical Exam  Constitutional: She is oriented to person, place, and time. She appears well-nourished.  Cardiovascular: Normal rate and regular rhythm.   Murmur heard. Pulmonary/Chest: Effort normal and breath sounds normal.  Neurological: She is alert and oriented to person, place, and time.  Skin: Skin is warm and dry.  Psychiatric: She has a normal mood and affect.          Assessment & Plan:

## 2014-09-06 NOTE — Assessment & Plan Note (Signed)
Denies today. Feels very well since retiring once year ago. No medications at this time. Denies SI/HI

## 2014-09-10 ENCOUNTER — Other Ambulatory Visit: Payer: Self-pay | Admitting: Internal Medicine

## 2014-09-11 NOTE — Telephone Encounter (Signed)
Pt request status of refill for valsartan-HCTZ and verapamil to [facility]; spoke with pt and she will ck with pharmacy; pt will cb if needed.

## 2014-11-30 ENCOUNTER — Ambulatory Visit (INDEPENDENT_AMBULATORY_CARE_PROVIDER_SITE_OTHER): Payer: Federal, State, Local not specified - PPO | Admitting: Family Medicine

## 2014-11-30 ENCOUNTER — Encounter: Payer: Self-pay | Admitting: Family Medicine

## 2014-11-30 VITALS — BP 136/78 | HR 65 | Temp 98.1°F | Ht 65.0 in | Wt 255.6 lb

## 2014-11-30 DIAGNOSIS — M25562 Pain in left knee: Secondary | ICD-10-CM

## 2014-11-30 DIAGNOSIS — H8113 Benign paroxysmal vertigo, bilateral: Secondary | ICD-10-CM

## 2014-11-30 NOTE — Progress Notes (Signed)
Patient ID: Paige Hester, female   DOB: 06-19-1955, 59 y.o.   MRN: [id number]  Paige Rumps, MD Phone: [phone]  Paige Hester is a 59 y.o. female who presents today for same day appointment.   Vertigo: patient presents with dizziness. Notes this started Saturday morning. She notes the night before she went out with the girls and had 4 glasses of wine. Notes the next morning she woke up and felt dizzy. Notes this dizziness only occurred with change in head position while laying down. Notes she feels wobbly with rotational head movements. This is not exertional. No tinnitus or ear fullness. No chest pain or trouble breathing. No light headedness. She notes the Saturday morning she went to [employer] and went to the rest room. She notes vertigo on sitting down and turning head and notes she fell forward with this and hit her left knee on the ground and her head contacted the wall, though notes this was not hard contact. She denies LOC. Notes soft tissue was sore initially, though this has resolved and no headaches noted. No vision changes, neck pain, weakness, numbness. Notes mild left knee pain following this, though this is improving. No swelling. No skin changes. Asymptomatic at this time.   PMH: nonsmoker.    ROS see HPI  Objective  Physical Exam Filed Vitals:   11/30/14 1600  BP: 136/78  Pulse:   Temp:    Physical Exam  Constitutional: She is well-developed, well-nourished, and in no distress.  HENT:  Head: Normocephalic and atraumatic.  Right Ear: External ear normal.  Left Ear: External ear normal.  Mouth/Throat: Oropharynx is clear and moist. No oropharyngeal exudate.  Normal TMs bilaterally  Eyes: Conjunctivae are normal. Pupils are equal, round, and reactive to light.  Neck: Normal range of motion. Neck supple.  No scalp or neck tenderness, no midline spine tenderness, no midline back tenderness, no back tenderness, no scalp swelling or skin lesions    Cardiovascular: Normal rate, regular rhythm and normal heart sounds.  Exam reveals no gallop and no friction rub.   No murmur heard. Pulmonary/Chest: Effort normal and breath sounds normal. No respiratory distress. She has no wheezes. She has no rales.  Musculoskeletal:  Left knee with no swelling or erythema, no bruising, there is mild TTP over the inferior pole of the patella with a small indentation palpated, no joint line TTP, ligaments intact, full ROM, negative mcmurray Right knee with no swelling, erythema, or tenderness, ligaments intact, negative mcmurray  Lymphadenopathy:    She has no cervical adenopathy.  Neurological: She is alert.  Neuro: CN 2-12 intact, 5/5 strength in bilateral biceps, triceps, grip, quads, hamstrings, plantar and dorsiflexion, sensation to light touch intact in bilateral UE and LE, normal gait, 2+ patellar reflexes, negative rhomberg, no pronator drift  Skin: Skin is warm and dry. She is not diaphoretic.  Dix halpike positive bilaterally with nystagmus    Assessment/Plan: Please see individual problem list.  BPPV (benign paroxysmal positional vertigo) Patient with vertigo likely related to BPPV based on positive dix halpike. Neurologically intact. No evidence of head trauma from recent fall. Not on blood thinners. Unlikely intracranial process. Does not appear to be light headedness or cardiac related. Given modified epley and referred to vestibular rehab. Given return precautions.   Left knee pain Likely soft tissue injury s/p fall on left knee, though with TTP at lower pole of patella and indentation I recommended XR to evaluate for fracture of patella and patient declined. Will continue  to monitor as this seems to be improving. Given return precautions.     Orders Placed This Encounter  Procedures  . PT vestibular rehab    Standing Status: Future     Number of Occurrences:      Standing Expiration Date: 11/30/2015    Paige Hester

## 2014-11-30 NOTE — Patient Instructions (Signed)
Nice to meet you. You likely have BPPV. Please complete the home exercises to help with this.  Change positions slowly. If you have persistent vertigo, or develop numbness, weakness, vision changes, or feel poorly please seek medical attention.   Benign Positional Vertigo Vertigo means you feel like you or your surroundings are moving when they are not. Benign positional vertigo is the most common form of vertigo. Benign means that the cause of your condition is not serious. Benign positional vertigo is more common in older adults. CAUSES  Benign positional vertigo is the result of an upset in the labyrinth system. This is an area in the middle ear that helps control your balance. This may be caused by a viral infection, head injury, or repetitive motion. However, often no specific cause is found. SYMPTOMS  Symptoms of benign positional vertigo occur when you move your head or eyes in different directions. Some of the symptoms may include:  Loss of balance and falls.  Vomiting.  Blurred vision.  Dizziness.  Nausea.  Involuntary eye movements (nystagmus). DIAGNOSIS  Benign positional vertigo is usually diagnosed by physical exam. If the specific cause of your benign positional vertigo is unknown, your caregiver may perform imaging tests, such as magnetic resonance imaging (MRI) or computed tomography (CT). TREATMENT  Your caregiver may recommend movements or procedures to correct the benign positional vertigo. Medicines such as meclizine, benzodiazepines, and medicines for nausea may be used to treat your symptoms. In rare cases, if your symptoms are caused by certain conditions that affect the inner ear, you may need surgery. HOME CARE INSTRUCTIONS   Follow your caregiver's instructions.  Move slowly. Do not make sudden body or head movements.  Avoid driving.  Avoid operating heavy machinery.  Avoid performing any tasks that would be dangerous to you or others during a vertigo  episode.  Drink enough fluids to keep your urine clear or pale yellow. SEEK IMMEDIATE MEDICAL CARE IF:   You develop problems with walking, weakness, numbness, or using your arms, hands, or legs.  You have difficulty speaking.  You develop severe headaches.  Your nausea or vomiting continues or gets worse.  You develop visual changes.  Your family or friends notice any behavioral changes.  Your condition gets worse.  You have a fever.  You develop a stiff neck or sensitivity to light. MAKE SURE YOU:   Understand these instructions.  Will watch your condition.  Will get help right away if you are not doing well or get worse. Document Released: 12/09/2005 Document Revised: 05/26/2011 Document Reviewed: 11/21/2010 [facility] Patient Information 2015 [place], [place]. This information is not intended to replace advice given to you by your health care provider. Make sure you discuss any questions you have with your health care provider.

## 2014-11-30 NOTE — Progress Notes (Signed)
Pre visit review using our clinic review tool, if applicable. No additional management support is needed unless otherwise documented below in the visit note. 

## 2014-12-01 DIAGNOSIS — M25562 Pain in left knee: Secondary | ICD-10-CM | POA: Insufficient documentation

## 2014-12-01 DIAGNOSIS — H8110 Benign paroxysmal vertigo, unspecified ear: Secondary | ICD-10-CM | POA: Insufficient documentation

## 2014-12-01 NOTE — Assessment & Plan Note (Signed)
Likely soft tissue injury s/p fall on left knee, though with TTP at lower pole of patella and indentation I recommended XR to evaluate for fracture of patella and patient declined. Will continue to monitor as this seems to be improving. Given return precautions.

## 2014-12-01 NOTE — Assessment & Plan Note (Signed)
Patient with vertigo likely related to BPPV based on positive dix halpike. Neurologically intact. No evidence of head trauma from recent fall. Not on blood thinners. Unlikely intracranial process. Does not appear to be light headedness or cardiac related. Given modified epley and referred to vestibular rehab. Given return precautions.

## 2015-03-08 ENCOUNTER — Telehealth: Payer: Self-pay | Admitting: Primary Care

## 2015-03-08 ENCOUNTER — Ambulatory Visit: Payer: Federal, State, Local not specified - PPO | Admitting: Primary Care

## 2015-03-08 NOTE — Telephone Encounter (Signed)
No follow up necessary. Please do not charge no show fee. Thanks.

## 2015-03-08 NOTE — Telephone Encounter (Signed)
Pt did not come in for their appt today for a follow up. Please let me know if pt needs to be contacted immediately for follow up or no follow up needed. Best phone number to contact pt is [phone].

## 2015-05-18 ENCOUNTER — Encounter: Payer: Self-pay | Admitting: Gastroenterology

## 2015-05-31 ENCOUNTER — Ambulatory Visit: Payer: Federal, State, Local not specified - PPO | Admitting: Family Medicine

## 2015-05-31 ENCOUNTER — Ambulatory Visit (INDEPENDENT_AMBULATORY_CARE_PROVIDER_SITE_OTHER): Payer: Federal, State, Local not specified - PPO | Admitting: Internal Medicine

## 2015-05-31 ENCOUNTER — Encounter: Payer: Self-pay | Admitting: Internal Medicine

## 2015-05-31 VITALS — BP 132/72 | HR 73 | Temp 98.2°F | Wt 258.0 lb

## 2015-05-31 DIAGNOSIS — M6530 Trigger finger, unspecified finger: Secondary | ICD-10-CM | POA: Diagnosis not present

## 2015-05-31 MED ORDER — IBUPROFEN 600 MG PO TABS: 600.0000 mg | ORAL_TABLET | Freq: Two times a day (BID) | ORAL | Status: DC | PRN

## 2015-05-31 NOTE — Progress Notes (Signed)
Pre visit review using our clinic review tool, if applicable. No additional management support is needed unless otherwise documented below in the visit note. 

## 2015-05-31 NOTE — Progress Notes (Signed)
Subjective:    Patient ID: Paige Hester, female    DOB: 03-24-1955, 60 y.o.   MRN: [id number]  HPI  Pt presents to the clinic today with c/o pain in her middle finger, left hand. She noticed this 3 weeks ago. It is stiff. She is unable to bend it or straighten it out completely. She has noticed some pain in the joint but no swelling. She denies any injury to the area. She has not tried anything OTC. She has soaked her hand in hot water with some relief.  Review of Systems      Past Medical History  Diagnosis Date  . Hypertension   . Menopausal symptoms     since 2007  . Vitamin D deficiency   . Gallbladder polyp 2012    Current Outpatient Prescriptions  Medication Sig Dispense Refill  . aspirin (BAYER ASPIRIN) 325 MG tablet Take 1 tablet (325 mg total) by mouth daily. 100 tablet 3  . Cholecalciferol (VITAMIN D3) 1000 UNITS tablet Take 1,000 Units by mouth daily.      . fluticasone (FLONASE) 50 MCG/ACT nasal spray Place 2 sprays into both nostrils daily. 16 g 6  . omeprazole (PRILOSEC) 20 MG capsule Take 20 mg by mouth daily.    . valsartan-hydrochlorothiazide (DIOVAN-HCT) 320-25 MG per tablet TAKE ONE TABLET BY MOUTH ONCE DAILY *YEARLY PHYSICAL IS DUE. MUST SEE MD FOR FUTURE REFILLS* 90 tablet 3  . verapamil (VERELAN PM) 360 MG 24 hr capsule TAKE ONE CAPSULE BY MOUTH AT BEDTIME *OVERDUE FOR YEARLY PHYSICAL WITH LABS. MUST SEE MD FOR FUTURE REFILLS* 90 capsule 3   No current facility-administered medications for this visit.    No Known Allergies  Family History  Problem Relation Age of Onset  . Heart disease Mother   . Hypertension Mother   . Colon cancer Maternal Aunt   . Heart attack Mother 5  . Prostate cancer Father   . Heart disease Father 73    CAD, STENT  . Heart disease Brother     Myocardial infarction  . Coronary artery disease Sister     Social History   Social History  . Marital Status: Married    Spouse Name: N/A  . Number of Children: N/A    . Years of Education: N/A   Occupational History  . Not on file.   Social History Main Topics  . Smoking status: Former Research scientist (life sciences)  . Smokeless tobacco: Never Used  . Alcohol Use: 4.2 oz/week    7 Glasses of wine per week  . Drug Use: No  . Sexual Activity: Yes   Other Topics Concern  . Not on file   Social History Narrative   Married.   1 child, 1 grandchild.   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   Enjoys going to [employer], singing.      Constitutional: Denies fever, malaise, fatigue, headache or abrupt weight changes.  Respiratory: Denies difficulty breathing, shortness of breath, cough or sputum production.   Cardiovascular: Denies chest pain, chest tightness, palpitations or swelling in the hands or feet.  Musculoskeletal: Pt reports finger pain. Denies difficulty with gait, muscle pain or joint swelling.  Skin: Denies redness, rashes, lesions or ulcercations.   No other specific complaints in a complete review of systems (except as listed in HPI above).  Objective:   Physical Exam   BP 132/72 mmHg  Pulse 73  Temp(Src) 98.2 F (36.8 C) (Oral)  Wt 258 lb (  117.028 kg)  SpO2 95% Wt Readings from Last 3 Encounters:  05/31/15 258 lb (117.028 kg)  11/30/14 255 lb 9.6 oz (115.939 kg)  09/06/14 258 lb 1.9 oz (117.082 kg)    General: Appears her stated age, obese in NAD. Musculoskeletal: Decreased flexion and extension of the left middle finger. Pain with palpation over the PIP, left middle finger. No swelling noted. Neurological: Alert and oriented.   BMET    Component Value Date/Time   NA 138 08/31/2014 0933   K 3.4* 08/31/2014 0933   CL 101 08/31/2014 0933   CO2 30 08/31/2014 0933   GLUCOSE 102* 08/31/2014 0933   BUN 13 08/31/2014 0933   CREATININE 0.91 08/31/2014 0933   CALCIUM 9.8 08/31/2014 0933   GFRNONAA 93.10 01/03/2010 1305   GFRAA 97 08/20/2007 1308    Lipid Panel     Component Value Date/Time   CHOL  182 08/31/2014 0933   TRIG 70.0 08/31/2014 0933   HDL 52.50 08/31/2014 0933   CHOLHDL 3 08/31/2014 0933   VLDL 14.0 08/31/2014 0933   LDLCALC 116* 08/31/2014 0933    CBC    Component Value Date/Time   WBC 9.1 08/31/2014 0933   RBC 4.56 08/31/2014 0933   HGB 12.3 08/31/2014 0933   HCT 37.3 08/31/2014 0933   PLT 357.0 08/31/2014 0933   MCV 81.9 08/31/2014 0933   MCHC 33.0 08/31/2014 0933   RDW 14.9 08/31/2014 0933   LYMPHSABS 3.5 08/31/2014 0933   MONOABS 0.7 08/31/2014 0933   EOSABS 0.4 08/31/2014 0933   BASOSABS 0.1 08/31/2014 0933    Hgb A1C No results found for: HGBA1C      Assessment & Plan:   Trigger finger, left hand:  Ok to continue warm compress eRx for Ibuprofen 600 mg BID with meals as needed for pain/stiffness Make an appt with Dr. Lorelei Pont for injection  RTC as needed or if symptoms persist or worsen

## 2015-05-31 NOTE — Patient Instructions (Signed)
Trigger Finger °Trigger finger (digital tendinitis and stenosing tenosynovitis) is a common disorder that causes an often painful catching of the fingers or thumb. It occurs as a clicking, snapping, or locking of a finger in the palm of the hand. This is caused by a problem with the tendons that flex or bend the fingers sliding smoothly through their sheaths. The condition may occur in any finger or a couple fingers at the same time.  °The finger may lock with the finger curled or suddenly straighten out with a snap. This is more common in patients with rheumatoid arthritis and diabetes. Left untreated, the condition may get worse to the point where the finger becomes locked in flexion, like making a fist, or less commonly locked with the finger straightened out. °CAUSES  °· Inflammation and scarring that lead to swelling around the tendon sheath. °· Repeated or forceful movements. °· Rheumatoid arthritis, an autoimmune disease that affects joints. °· Gout. °· Diabetes mellitus. °SIGNS AND SYMPTOMS °· Soreness and swelling of your finger. °· A painful clicking or snapping as you bend and straighten your finger. °DIAGNOSIS  °Your health care provider will do a physical exam of your finger to diagnose trigger finger. °TREATMENT  °· Splinting for 6-8 weeks may be helpful. °· Nonsteroidal anti-inflammatory medicines (NSAIDs) can help to relieve the pain and inflammation. °· Cortisone injections, along with splinting, may speed up recovery. Several injections may be required. Cortisone may give relief after one injection. °· Surgery is another treatment that may be used if conservative treatments do not work. Surgery can be minor, without incisions (a cut does not have to be made), and can be done with a needle through the skin. °· Other surgical choices involve an open procedure in which the surgeon opens the hand through a small incision and cuts the pulley so the tendon can again slide smoothly. Your hand will still  work fine. °HOME CARE INSTRUCTIONS °· Apply ice to the injured area, twice per day: °¨ Put ice in a plastic bag. °¨ Place a towel between your skin and the bag. °¨ Leave the ice on for 20 minutes, 3-4 times a day. °· Rest your hand often. °MAKE SURE YOU:  °· Understand these instructions. °· Will watch your condition. °· Will get help right away if you are not doing well or get worse. °  °This information is not intended to replace advice given to you by your health care provider. Make sure you discuss any questions you have with your health care provider. °  °Document Released: 12/22/2003 Document Revised: 11/03/2012 Document Reviewed: 08/03/2012 °Elsevier Interactive Patient Education ©2016 Elsevier Inc. ° °

## 2015-06-06 ENCOUNTER — Encounter: Payer: Self-pay | Admitting: Family Medicine

## 2015-06-06 ENCOUNTER — Ambulatory Visit (INDEPENDENT_AMBULATORY_CARE_PROVIDER_SITE_OTHER): Payer: Federal, State, Local not specified - PPO | Admitting: Family Medicine

## 2015-06-06 VITALS — BP 110/60 | HR 66 | Temp 98.2°F | Ht 65.0 in | Wt 259.5 lb

## 2015-06-06 DIAGNOSIS — M65332 Trigger finger, left middle finger: Secondary | ICD-10-CM | POA: Diagnosis not present

## 2015-06-06 MED ORDER — METHYLPREDNISOLONE ACETATE 40 MG/ML IJ SUSP
20.0000 mg | Freq: Once | INTRAMUSCULAR | Status: AC
  Administered 2015-06-06: 20 mg via INTRA_ARTICULAR

## 2015-06-06 NOTE — Progress Notes (Signed)
Pre visit review using our clinic review tool, if applicable. No additional management support is needed unless otherwise documented below in the visit note. 

## 2015-06-06 NOTE — Progress Notes (Signed)
   Dr. Frederico Hamman T. Shenika Quint, MD, [facility] [place], [address] Phone: [phone] Fax: [phone]  06/06/2015  Patient: BREIGHANNA HOLTON, MRN: [id number], DOB: [birth year], 60 y.o.  Primary Physician:  Sheral Flow, NP   Chief Complaint  Patient presents with  . Trigger finger    Left middle finger   Subjective:   CILIA PIEDRA is a 60 y.o. very pleasant female patient who presents with the following:  L middle finger, trigger:  Trigger for a few weeks.   Procedure Only:  Trigger Finger Injection, L middle Verbal consent was obtained. Risks (including rare risk of infection, potential risk for skin lightening and potential atrophy), benefits and alternatives were discussed. Prepped with Chloraprep and Ethyl Chloride used for anesthesia. Under sterile conditions, patient injected at palmar crease aiming distally with 45 degree angle towards nodule; injected directly into tendon sheath. Medication flowed freely without resistance.  Needle size: 22 gauge 1 1/2 inch Injection: 1/2 cc of Lidocaine 1% and Depo-Medrol 20 mg   Signed,  Ramonda Galyon T. Jacelynn Hayton, MD

## 2015-09-18 ENCOUNTER — Other Ambulatory Visit: Payer: Self-pay | Admitting: Internal Medicine

## 2015-09-30 ENCOUNTER — Other Ambulatory Visit: Payer: Self-pay | Admitting: Internal Medicine

## 2015-10-02 ENCOUNTER — Other Ambulatory Visit: Payer: Self-pay

## 2015-10-02 MED ORDER — VERAPAMIL HCL ER 360 MG PO CP24: ORAL_CAPSULE | ORAL | Status: DC

## 2015-10-02 NOTE — Telephone Encounter (Signed)
Pt request 90 day refill verapamil to [facility] [address]. Pt established care 09/06/14. Pt scheduled CPX on 10/10/15. Refilled x 1 pt voiced understanding.

## 2015-10-10 ENCOUNTER — Encounter: Payer: Self-pay | Admitting: Primary Care

## 2015-10-10 ENCOUNTER — Ambulatory Visit (INDEPENDENT_AMBULATORY_CARE_PROVIDER_SITE_OTHER): Payer: Federal, State, Local not specified - PPO | Admitting: Primary Care

## 2015-10-10 ENCOUNTER — Other Ambulatory Visit (HOSPITAL_COMMUNITY)
Admission: RE | Admit: 2015-10-10 | Discharge: 2015-10-10 | Disposition: A | Discharge status: Home / Self Care | Payer: Federal, State, Local not specified - PPO | Source: Ambulatory Visit | Attending: Primary Care | Admitting: Primary Care

## 2015-10-10 VITALS — BP 124/72 | HR 58 | Temp 97.5°F | Ht 65.0 in | Wt 260.8 lb

## 2015-10-10 DIAGNOSIS — Z01419 Encounter for gynecological examination (general) (routine) without abnormal findings: Secondary | ICD-10-CM | POA: Insufficient documentation

## 2015-10-10 DIAGNOSIS — Z1239 Encounter for other screening for malignant neoplasm of breast: Secondary | ICD-10-CM | POA: Diagnosis not present

## 2015-10-10 DIAGNOSIS — H8113 Benign paroxysmal vertigo, bilateral: Secondary | ICD-10-CM

## 2015-10-10 DIAGNOSIS — Z0000 Encounter for general adult medical examination without abnormal findings: Secondary | ICD-10-CM

## 2015-10-10 DIAGNOSIS — R350 Frequency of micturition: Secondary | ICD-10-CM

## 2015-10-10 DIAGNOSIS — R011 Cardiac murmur, unspecified: Secondary | ICD-10-CM

## 2015-10-10 DIAGNOSIS — Z0001 Encounter for general adult medical examination with abnormal findings: Secondary | ICD-10-CM

## 2015-10-10 DIAGNOSIS — K219 Gastro-esophageal reflux disease without esophagitis: Secondary | ICD-10-CM

## 2015-10-10 DIAGNOSIS — I10 Essential (primary) hypertension: Secondary | ICD-10-CM

## 2015-10-10 DIAGNOSIS — Z124 Encounter for screening for malignant neoplasm of cervix: Secondary | ICD-10-CM

## 2015-10-10 DIAGNOSIS — Z23 Encounter for immunization: Secondary | ICD-10-CM

## 2015-10-10 DIAGNOSIS — Z1151 Encounter for screening for human papillomavirus (HPV): Secondary | ICD-10-CM | POA: Insufficient documentation

## 2015-10-10 DIAGNOSIS — E669 Obesity, unspecified: Secondary | ICD-10-CM

## 2015-10-10 LAB — POC URINALSYSI DIPSTICK (AUTOMATED)
BILIRUBIN UA: NEGATIVE
Blood, UA: NEGATIVE
Glucose, UA: NEGATIVE
Ketones, UA: NEGATIVE
LEUKOCYTES UA: NEGATIVE
NITRITE UA: NEGATIVE
PH UA: 6
Protein, UA: NEGATIVE
Spec Grav, UA: 1.015
Urobilinogen, UA: NEGATIVE

## 2015-10-10 LAB — COMPREHENSIVE METABOLIC PANEL
ALBUMIN: 4 g/dL (ref 3.5–5.2)
ALT: 12 U/L (ref 0–35)
AST: 18 U/L (ref 0–37)
Alkaline Phosphatase: 69 U/L (ref 39–117)
BUN: 15 mg/dL (ref 6–23)
CHLORIDE: 98 meq/L (ref 96–112)
CO2: 32 meq/L (ref 19–32)
Calcium: 10.1 mg/dL (ref 8.4–10.5)
Creatinine, Ser: 0.89 mg/dL (ref 0.40–1.20)
GFR: 83 mL/min (ref >60.00)
Glucose, Bld: 92 mg/dL (ref 70–99)
POTASSIUM: 3.6 meq/L (ref 3.5–5.1)
Sodium: 137 mEq/L (ref 135–145)
Total Bilirubin: 0.3 mg/dL (ref 0.2–1.2)
Total Protein: 7.6 g/dL (ref 6.0–8.3)

## 2015-10-10 LAB — LIPID PANEL
CHOLESTEROL: 186 mg/dL (ref 0–200)
HDL: 47.9 mg/dL (ref >39.00)
LDL CALC: 118 mg/dL — AB (ref 0–99)
NonHDL: 138.16
TRIGLYCERIDES: 101 mg/dL (ref 0.0–149.0)
Total CHOL/HDL Ratio: 4
VLDL: 20.2 mg/dL (ref 0.0–40.0)

## 2015-10-10 LAB — HEMOGLOBIN A1C: Hgb A1c MFr Bld: 6 % (ref 4.6–6.5)

## 2015-10-10 LAB — VITAMIN D 25 HYDROXY (VIT D DEFICIENCY, FRACTURES): VITD: 54.66 ng/mL (ref 30.00–100.00)

## 2015-10-10 MED ORDER — VALSARTAN-HYDROCHLOROTHIAZIDE 320-25 MG PO TABS: 1.0000 | ORAL_TABLET | Freq: Every day | ORAL | 3 refills | Status: DC

## 2015-10-10 MED ORDER — ZOSTER VACCINE LIVE 19400 UNT/0.65ML ~~LOC~~ SUSR: 0.6500 mL | Freq: Once | SUBCUTANEOUS | 0 refills | Status: AC

## 2015-10-10 NOTE — Assessment & Plan Note (Signed)
Stable on PPI 

## 2015-10-10 NOTE — Assessment & Plan Note (Signed)
Stable today in clinic. Continue current regimen.

## 2015-10-10 NOTE — Assessment & Plan Note (Signed)
Tetanus up-to-date. Zostavax due and prescription provided for her to take to the pharmacy. Mammogram due, ordered. Pap due, completed today. Colonoscopy up-to-date, due in 2020. Exam with evidence of murmur that is chronic with radiation to bilateral carotids. Obesity. Exam otherwise unremarkable. Labs pending. Repeat carotid Dopplers overdue, ordered today. Discussed the importance of a healthy diet and regular exercise in order for weight loss and to reduce risk of other medical diseases.  Follow-up in one year for repeat physical.

## 2015-10-10 NOTE — Patient Instructions (Addendum)
Take the prescription for the shingles vaccination to the pharmacy. They will administer the vaccination there.  You will be contacted regarding your Carotid Ultrasound and Mammogram.  Please let us know if you have not heard back within one week.   Complete lab work prior to leaving today. I will notify you of your results once received.   It is important that you improve your diet. Please limit carbohydrates in the form of white bread, rice, pasta, cakes, fast food, fried food, sugary drinks, etc. Increase your consumption of fresh fruits and vegetables, whole grains, lean protein.  Ensure you are consuming 64 ounces of water daily.  Start exercising. You should be getting 1 hour of moderate intensity exercise 5 days weekly.  I sent refills of your valsartan-HCTZ to the pharmacy.  Follow up in 1 year for repeat physical or sooner if needed.  It was a pleasure to see you today!

## 2015-10-10 NOTE — Progress Notes (Signed)
Pre visit review using our clinic review tool, if applicable. No additional management support is needed unless otherwise documented below in the visit note. 

## 2015-10-10 NOTE — Assessment & Plan Note (Signed)
Present today on exam with radiation to bilateral carotids. Echo in 2014 with EF of 65-70% with LVH. She is asymptomatic.

## 2015-10-10 NOTE — Assessment & Plan Note (Signed)
Fair diet, does not exercise. Discussed the importance of a healthy diet and regular exercise in order for weight loss and to reduce risk of other medical diseases.

## 2015-10-10 NOTE — Progress Notes (Signed)
Subjective:    Patient ID: Paige Hester, female    DOB: 1955-05-22, 60 y.o.   MRN: [id number]  HPI  [name] is a 60 year old female who presents today for complete physical.  Immunizations: -Tetanus: Completed in 2014 -Influenza: Did not complete last season -Shingles: Never completed, due  Diet: She endorses a healthy diet. Breakfast: Oatmeal, coffee Lunch: Sandwich, [place] sub, salad Dinner: Pizza, chicken, potatoes, green beans, steak, rice Snacks: Fruit snacks, chips Desserts: Occasionally, 2 days weekly Beverages: Water, some soda, occasional sweet tea   Exercise: She does not currently exercise. Eye exam: Completed in 2015 Dental exam: Has not completed in several years.  Colonoscopy: Completed in 2010, due in 2020 Pap Smear: Completed in 2014, due today Mammogram: Completed in 2014, due   Review of Systems  Constitutional: Negative for fever and unexpected weight change.  HENT: Negative for rhinorrhea.   Respiratory: Negative for cough and shortness of breath.   Cardiovascular: Negative for chest pain.  Gastrointestinal: Negative for constipation and diarrhea.  Endocrine: Positive for polydipsia and polyuria. Negative for polyphagia.  Genitourinary: Positive for frequency. Negative for difficulty urinating and menstrual problem.       Nocturia, will wake several times nightly for the past six months.  Musculoskeletal: Negative for arthralgias and myalgias.  Skin: Negative for rash.  Allergic/Immunologic: Negative for environmental allergies.  Neurological: Negative for dizziness, numbness and headaches.       History of vertigo  Psychiatric/Behavioral:       Denies concerns for anxiety or depression       Past Medical History:  Diagnosis Date  . Gallbladder polyp 2012  . Hypertension   . Menopausal symptoms    since 2007  . Vitamin D deficiency      Social History   Social History  . Marital status: Married    Spouse name: N/A  .  Number of children: N/A  . Years of education: N/A   Occupational History  . Not on file.   Social History Main Topics  . Smoking status: Former Research scientist (life sciences)  . Smokeless tobacco: Never Used  . Alcohol use 4.2 oz/week    7 Glasses of wine per week  . Drug use: No  . Sexual activity: Yes   Other Topics Concern  . Not on file   Social History Narrative   Married.   1 child, 1 grandchild.   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   Enjoys going to [employer], singing.     No past surgical history on file.  Family History  Problem Relation Age of Onset  . Heart disease Mother   . Hypertension Mother   . Colon cancer Maternal Aunt   . Heart attack Mother 23  . Prostate cancer Father   . Heart disease Father 69    CAD, STENT  . Heart disease Brother     Myocardial infarction  . Coronary artery disease Sister     No Known Allergies  Current Outpatient Prescriptions on File Prior to Visit  Medication Sig Dispense Refill  . Cholecalciferol (VITAMIN D3) 1000 UNITS tablet Take 1,000 Units by mouth daily.      . fluticasone (FLONASE) 50 MCG/ACT nasal spray Place 2 sprays into both nostrils daily. 16 g 6  . ibuprofen (ADVIL,MOTRIN) 600 MG tablet Take 1 tablet (600 mg total) by mouth 2 (two) times daily as needed. 30 tablet 0  . omeprazole (PRILOSEC) 20 MG capsule Take 20  mg by mouth daily.    . verapamil (VERELAN PM) 360 MG 24 hr capsule TAKE ONE CAPSULE BY MOUTH AT BEDTIME 90 capsule 0   No current facility-administered medications on file prior to visit.     BP 124/72 (BP Location: Right Arm, Patient Position: Sitting, Cuff Size: Large)   Pulse (!) 58   Temp 97.5 F (36.4 C) (Oral)   Ht 5\' 5"  (1.651 m)   Wt 260 lb 12.8 oz (118.3 kg)   SpO2 98%   BMI 43.40 kg/m    Objective:   Physical Exam  Constitutional: She is oriented to person, place, and time. She appears well-nourished.  HENT:  Right Ear: Tympanic membrane and ear canal  normal.  Left Ear: Tympanic membrane and ear canal normal.  Nose: Nose normal.  Mouth/Throat: Oropharynx is clear and moist.  Eyes: Conjunctivae and EOM are normal. Pupils are equal, round, and reactive to light.  Neck: Neck supple. No thyromegaly present.  Cardiovascular: Normal rate and regular rhythm.   Murmur heard. Murmur noted to bilateral carotids.  Pulmonary/Chest: Effort normal and breath sounds normal. She has no rales.  Abdominal: Soft. Bowel sounds are normal. There is no tenderness.  Genitourinary: There is no tenderness on the right labia. There is no tenderness on the left labia. Cervix exhibits no motion tenderness and no discharge. Right adnexum displays no tenderness. Left adnexum displays no tenderness. No erythema in the vagina. No vaginal discharge found.  Musculoskeletal: Normal range of motion.  Lymphadenopathy:    She has no cervical adenopathy.  Neurological: She is alert and oriented to person, place, and time. She has normal reflexes. No cranial nerve deficit.  Skin: Skin is warm and dry. No rash noted.  Psychiatric: She has a normal mood and affect.          Assessment & Plan:  Nocturia:  Increased frequency during nighttime hours also experiences during the day. She does endorse polydipsia and has gained some weight since last visit. UA: Negative. No cystocele or rectocele noted upon pelvic examination. Will check A1c to rule out diabetes. Other labs pending.  Sheral Flow, NP

## 2015-10-10 NOTE — Assessment & Plan Note (Signed)
Occasional, no recent episodes.

## 2015-10-11 LAB — CYTOLOGY - PAP

## 2015-10-29 ENCOUNTER — Ambulatory Visit: Payer: Federal, State, Local not specified - PPO

## 2015-10-29 DIAGNOSIS — I6523 Occlusion and stenosis of bilateral carotid arteries: Secondary | ICD-10-CM | POA: Diagnosis not present

## 2015-10-29 DIAGNOSIS — I10 Essential (primary) hypertension: Secondary | ICD-10-CM

## 2015-10-29 DIAGNOSIS — R0989 Other specified symptoms and signs involving the circulatory and respiratory systems: Secondary | ICD-10-CM | POA: Diagnosis not present

## 2015-12-17 ENCOUNTER — Ambulatory Visit: Payer: Federal, State, Local not specified - PPO | Admitting: Primary Care

## 2015-12-20 ENCOUNTER — Encounter: Payer: Self-pay | Admitting: Primary Care

## 2015-12-20 ENCOUNTER — Other Ambulatory Visit: Payer: Self-pay | Admitting: Primary Care

## 2015-12-20 ENCOUNTER — Ambulatory Visit (INDEPENDENT_AMBULATORY_CARE_PROVIDER_SITE_OTHER): Payer: Federal, State, Local not specified - PPO | Admitting: Primary Care

## 2015-12-20 DIAGNOSIS — H8113 Benign paroxysmal vertigo, bilateral: Secondary | ICD-10-CM | POA: Diagnosis not present

## 2015-12-20 NOTE — Progress Notes (Signed)
Subjective:    Patient ID: DASHAE PERINI, female    DOB: 12-Aug-1955, 60 y.o.   MRN: [id number]  HPI  Ms. Zimmerly is a 60 year old female with a history of vertigo who presents today with a chief complaint of dizziness. Her dizziness is intermittent but since July 2017 her dizziness has occurred once monthly. Her episodes are intermittent and last 3 days.   Her first episode was in September 2016. She was evaluated at that time which was determined to be BVVV. She was provided with exercises to help with vertigo for which she has used with success until July 2017 when these exercises were no longer effective alone. Her symptoms are typically present when she wakes from sleep with the sensation that the room is spinning. She will slowly rise from a sitting position, and then get up. Her dizziness will last for about 30 minutes and continue intermittently for 3 days. She is currently managed on Claritin once daily which she has taken for years. She has not tried any over-the-counter or prescription treatment for vertigo. Denies fevers, cough, confusion, numbness/tingling, changes in speech, weakness, dizziness today.  Review of Systems  Constitutional: Negative for fever.  HENT: Negative for congestion, rhinorrhea and sinus pressure.   Respiratory: Negative for cough and shortness of breath.   Cardiovascular: Negative for chest pain.  Neurological: Positive for dizziness. Negative for facial asymmetry, weakness and numbness.       Past Medical History:  Diagnosis Date  . Gallbladder polyp 2012  . Hypertension   . Iron deficiency anemia   . Menopausal symptoms    since 2007  . Murmur, cardiac   . Vitamin D deficiency      Social History   Social History  . Marital status: Married    Spouse name: N/A  . Number of children: N/A  . Years of education: N/A   Occupational History  . Not on file.   Social History Main Topics  . Smoking status: Former Research scientist (life sciences)  . Smokeless  tobacco: Never Used  . Alcohol use 4.2 oz/week    7 Glasses of wine per week  . Drug use: No  . Sexual activity: Yes   Other Topics Concern  . Not on file   Social History Narrative   Married.   1 child, 1 grandchild.   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   Enjoys going to [employer], singing.     No past surgical history on file.  Family History  Problem Relation Age of Onset  . Heart disease Mother   . Hypertension Mother   . Colon cancer Maternal Aunt   . Heart attack Mother 17  . Prostate cancer Father   . Heart disease Father 49    CAD, STENT  . Heart disease Brother     Myocardial infarction  . Coronary artery disease Sister     No Known Allergies  Current Outpatient Prescriptions on File Prior to Visit  Medication Sig Dispense Refill  . aspirin 81 MG tablet Take 81 mg by mouth daily.    . Cholecalciferol (VITAMIN D3) 1000 UNITS tablet Take 1,000 Units by mouth daily.      Marland Kitchen omeprazole (PRILOSEC) 20 MG capsule Take 20 mg by mouth daily.    . valsartan-hydrochlorothiazide (DIOVAN-HCT) 320-25 MG tablet Take 1 tablet by mouth daily. 90 tablet 3  . verapamil (VERELAN PM) 360 MG 24 hr capsule TAKE ONE CAPSULE BY MOUTH AT BEDTIME  90 capsule 0  . fluticasone (FLONASE) 50 MCG/ACT nasal spray Place 2 sprays into both nostrils daily. (Patient not taking: Reported on 12/20/2015) 16 g 6  . ibuprofen (ADVIL,MOTRIN) 600 MG tablet Take 1 tablet (600 mg total) by mouth 2 (two) times daily as needed. (Patient not taking: Reported on 12/20/2015) 30 tablet 0  . ranitidine (ZANTAC) 150 MG tablet Take 150 mg by mouth 2 (two) times daily.     No current facility-administered medications on file prior to visit.     BP 122/68   Pulse 66   Temp 98.3 F (36.8 C) (Oral)   Ht 5\' 5"  (1.651 m)   Wt 256 lb 6.4 oz (116.3 kg)   SpO2 98%   BMI 42.67 kg/m    Objective:   Physical Exam  Constitutional: She is oriented to person, place, and time. She  appears well-nourished.  Eyes: EOM are normal. Pupils are equal, round, and reactive to light.  Neck: Neck supple.  Cardiovascular: Normal rate and regular rhythm.   Pulmonary/Chest: Effort normal and breath sounds normal.  Neurological: She is alert and oriented to person, place, and time. No cranial nerve deficit. Coordination normal.  Skin: Skin is warm and dry.          Assessment & Plan:

## 2015-12-20 NOTE — Patient Instructions (Addendum)
Your symptoms are related to vertigo.   Purchase a medication called Meclizine. This may be purchased over the counter. Take 1/2 tablet at onset of dizziness. If no improvement then take the other half.   Continue to do those exercises as provided.  Switch from Loratadine to either Zyrtec or Allegra. Take this once daily.  Please notify me if no improvement in your symptoms after these efforts.  It was a pleasure to see you today!

## 2015-12-20 NOTE — Progress Notes (Signed)
Pre visit review using our clinic review tool, if applicable. No additional management support is needed unless otherwise documented below in the visit note. 

## 2015-12-20 NOTE — Assessment & Plan Note (Signed)
Intermittent since September 2016. Since July 2017 no improvement with Dix-Hallpike maneuver and other home exercises. Exam today unremarkable. We'll have her try meclizine OTC at onset of dizziness and continue exercises. If no improvement and consider ENT eval.

## 2015-12-24 ENCOUNTER — Other Ambulatory Visit: Payer: Self-pay | Admitting: Primary Care

## 2015-12-24 DIAGNOSIS — I10 Essential (primary) hypertension: Secondary | ICD-10-CM

## 2016-01-24 ENCOUNTER — Other Ambulatory Visit: Payer: Self-pay | Admitting: Primary Care

## 2016-01-24 NOTE — Telephone Encounter (Signed)
Pt request status of verapamil refill; spoke with [facility] and working on rx now. Pt voiced understanding and will ck with pharmacy.

## 2016-05-16 ENCOUNTER — Other Ambulatory Visit: Payer: Self-pay | Admitting: Primary Care

## 2016-05-16 MED ORDER — VERAPAMIL HCL ER 360 MG PO CP24: 360.0000 mg | ORAL_CAPSULE | Freq: Every day | ORAL | 0 refills | Status: DC

## 2016-07-30 ENCOUNTER — Other Ambulatory Visit: Payer: Self-pay | Admitting: Primary Care

## 2016-07-30 DIAGNOSIS — I10 Essential (primary) hypertension: Secondary | ICD-10-CM

## 2016-07-30 NOTE — Telephone Encounter (Signed)
Pt request status of valsartan HCTZ refill to [facility] [address]. Advised pt refill already done and pt will ck with pharmacy. Pt will cb to schedule appt before med is out.

## 2016-10-15 ENCOUNTER — Telehealth: Payer: Self-pay

## 2016-10-15 DIAGNOSIS — I10 Essential (primary) hypertension: Secondary | ICD-10-CM

## 2016-10-15 MED ORDER — LOSARTAN POTASSIUM-HCTZ 50-12.5 MG PO TABS: 1.0000 | ORAL_TABLET | Freq: Every day | ORAL | 0 refills | Status: DC

## 2016-10-15 NOTE — Telephone Encounter (Signed)
Will switch to losartan-hydrochlorothiazide 50-12.5 mg. Stop valsartan-hydrochlorothiazide. I sent this to her pharmacy.  Please notify patient that she is due for a follow up office visit, please schedule the visit in 2-3 weeks so we can recheck her blood pressure on the new medication. Have her monitor her BP at home and bring [place] readings.

## 2016-10-15 NOTE — Telephone Encounter (Signed)
Pt left v/m; pt received notice from [facility] [address] that valsartan had been recalled. Pt request cb with what med would be substituted for valsartan-HCTZ.

## 2016-10-16 NOTE — Telephone Encounter (Signed)
Spoken and notified patient of Kate's comments. Patient verbalized understanding.  Follow up has been schedule on 11/04/2016

## 2016-10-31 ENCOUNTER — Telehealth: Payer: Self-pay

## 2016-10-31 NOTE — Telephone Encounter (Signed)
lmov to schedule 1 yr fu carotid

## 2016-11-04 ENCOUNTER — Encounter: Payer: Self-pay | Admitting: Primary Care

## 2016-11-04 ENCOUNTER — Ambulatory Visit (INDEPENDENT_AMBULATORY_CARE_PROVIDER_SITE_OTHER): Payer: Federal, State, Local not specified - PPO | Admitting: Primary Care

## 2016-11-04 VITALS — BP 122/72 | HR 62 | Temp 97.9°F | Ht 65.0 in | Wt 252.0 lb

## 2016-11-04 DIAGNOSIS — I10 Essential (primary) hypertension: Secondary | ICD-10-CM

## 2016-11-04 DIAGNOSIS — R7303 Prediabetes: Secondary | ICD-10-CM

## 2016-11-04 LAB — COMPREHENSIVE METABOLIC PANEL
ALK PHOS: 76 U/L (ref 39–117)
ALT: 12 U/L (ref 0–35)
AST: 14 U/L (ref 0–37)
Albumin: 3.7 g/dL (ref 3.5–5.2)
BUN: 12 mg/dL (ref 6–23)
CO2: 33 meq/L — AB (ref 19–32)
Calcium: 9.8 mg/dL (ref 8.4–10.5)
Chloride: 103 mEq/L (ref 96–112)
Creatinine, Ser: 0.84 mg/dL (ref 0.40–1.20)
GFR: 88.41 mL/min (ref >60.00)
GLUCOSE: 106 mg/dL — AB (ref 70–99)
Potassium: 3.6 mEq/L (ref 3.5–5.1)
SODIUM: 140 meq/L (ref 135–145)
TOTAL PROTEIN: 7.6 g/dL (ref 6.0–8.3)
Total Bilirubin: 0.3 mg/dL (ref 0.2–1.2)

## 2016-11-04 LAB — HEMOGLOBIN A1C: HEMOGLOBIN A1C: 6.1 % (ref 4.6–6.5)

## 2016-11-04 MED ORDER — LOSARTAN POTASSIUM-HCTZ 50-12.5 MG PO TABS: 1.0000 | ORAL_TABLET | Freq: Every day | ORAL | 3 refills | Status: DC

## 2016-11-04 NOTE — Progress Notes (Signed)
Subjective:    Patient ID: Paige Hester, female    DOB: [birth year], 61 y.o.   MRN: [id number]  HPI  Paige Hester is a 61 year old female who presents today who presents today for follow up of hypertension. She was previously managed on valsartan-HCTZ but this was switched to losartan-HCTZ 50/12.5 mg as valsartan was on the recent recall list.   Her BP in the office today is 122/72. She's not been checking her blood pressure. She denies dizziness, headaches, visual changes. She is compliant to her verapamil 360 mg.   Review of Systems  Constitutional: Negative for fatigue.  Eyes: Negative for visual disturbance.  Respiratory: Negative for shortness of breath.   Cardiovascular: Negative for chest pain.  Neurological: Negative for dizziness and headaches.       Past Medical History:  Diagnosis Date  . Gallbladder polyp 2012  . Hypertension   . Iron deficiency anemia   . Menopausal symptoms    since 2007  . Murmur, cardiac   . Vitamin D deficiency      Social History   Social History  . Marital status: Married    Spouse name: N/A  . Number of children: N/A  . Years of education: N/A   Occupational History  . Not on file.   Social History Main Topics  . Smoking status: Former Research scientist (life sciences)  . Smokeless tobacco: Never Used  . Alcohol use 4.2 oz/week    7 Glasses of wine per week  . Drug use: No  . Sexual activity: Yes   Other Topics Concern  . Not on file   Social History Narrative   Married.   1 child, 1 grandchild.   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   Enjoys going to [employer], singing.     No past surgical history on file.  Family History  Problem Relation Age of Onset  . Heart disease Mother   . Hypertension Mother   . Colon cancer Maternal Aunt   . Heart attack Mother 8  . Prostate cancer Father   . Heart disease Father 48       CAD, STENT  . Heart disease Brother        Myocardial infarction  .  Coronary artery disease Sister     No Known Allergies  Current Outpatient Prescriptions on File Prior to Visit  Medication Sig Dispense Refill  . aspirin 81 MG tablet Take 81 mg by mouth daily.    . Cholecalciferol (VITAMIN D3) 1000 UNITS tablet Take 1,000 Units by mouth daily.      Marland Kitchen omeprazole (PRILOSEC) 20 MG capsule Take 20 mg by mouth daily.    . ranitidine (ZANTAC) 150 MG tablet Take 150 mg by mouth 2 (two) times daily.    . verapamil (VERELAN PM) 360 MG 24 hr capsule Take 1 capsule (360 mg total) by mouth at bedtime. 90 capsule 0  . fluticasone (FLONASE) 50 MCG/ACT nasal spray Place 2 sprays into both nostrils daily. (Patient not taking: Reported on 12/20/2015) 16 g 6  . ibuprofen (ADVIL,MOTRIN) 600 MG tablet Take 1 tablet (600 mg total) by mouth 2 (two) times daily as needed. (Patient not taking: Reported on 12/20/2015) 30 tablet 0   No current facility-administered medications on file prior to visit.     BP 122/72   Pulse 62   Temp 97.9 F (36.6 C) (Oral)   Ht 5\' 5"  (1.651 m)   Wt 252 lb (114.3  kg)   SpO2 96%   BMI 41.93 kg/m    Objective:   Physical Exam  Constitutional: She appears well-nourished.  Neck: Neck supple.  Cardiovascular: Normal rate and regular rhythm.   Murmur heard. Pulmonary/Chest: Effort normal and breath sounds normal.  Skin: Skin is warm and dry.          Assessment & Plan:

## 2016-11-04 NOTE — Assessment & Plan Note (Signed)
A1C of 6.0 in July 2017. A1C pending today.

## 2016-11-04 NOTE — Patient Instructions (Addendum)
Continue losartan-HCTZ 50/12.5 mg for high blood pressure.  Complete lab work prior to leaving today. I will notify you of your results once received.   It was a pleasure to see you today!

## 2016-11-04 NOTE — Assessment & Plan Note (Signed)
Stable since switch from valsartan-HCTZ to losartan-HCTZ. Continue verapamil. BMP pending today.

## 2016-11-05 LAB — CBC WITH DIFFERENTIAL/PLATELET
Basophils Absolute: 0.1 10*3/uL (ref 0.0–0.1)
Basophils Relative: 1.2 % (ref 0.0–3.0)
EOS PCT: 6.3 % — AB (ref 0.0–5.0)
Eosinophils Absolute: 0.4 10*3/uL (ref 0.0–0.7)
HEMATOCRIT: 36.6 % (ref 36.0–46.0)
HEMOGLOBIN: 11.8 g/dL — AB (ref 12.0–15.0)
LYMPHS PCT: 39.4 % (ref 12.0–46.0)
Lymphs Abs: 2.5 10*3/uL (ref 0.7–4.0)
MCHC: 32.2 g/dL (ref 30.0–36.0)
MCV: 82.9 fl (ref 78.0–100.0)
MONOS PCT: 10.1 % (ref 3.0–12.0)
Monocytes Absolute: 0.6 10*3/uL (ref 0.1–1.0)
NEUTROS PCT: 43 % (ref 43.0–77.0)
Neutro Abs: 2.8 10*3/uL (ref 1.4–7.7)
Platelets: 347 10*3/uL (ref 150.0–400.0)
RBC: 4.42 Mil/uL (ref 3.87–5.11)
RDW: 15.7 % — ABNORMAL HIGH (ref 11.5–15.5)
WBC: 6.4 10*3/uL (ref 4.0–10.5)

## 2016-11-05 NOTE — Addendum Note (Signed)
Addended by: Ellamae Sia on: 11/05/2016 08:35 AM   Modules accepted: Orders

## 2016-12-02 ENCOUNTER — Other Ambulatory Visit: Payer: Self-pay | Admitting: Primary Care

## 2016-12-02 DIAGNOSIS — I6523 Occlusion and stenosis of bilateral carotid arteries: Secondary | ICD-10-CM

## 2016-12-15 ENCOUNTER — Ambulatory Visit: Payer: Federal, State, Local not specified - PPO

## 2016-12-26 ENCOUNTER — Ambulatory Visit (INDEPENDENT_AMBULATORY_CARE_PROVIDER_SITE_OTHER): Payer: Federal, State, Local not specified - PPO

## 2016-12-26 DIAGNOSIS — I6523 Occlusion and stenosis of bilateral carotid arteries: Secondary | ICD-10-CM

## 2016-12-27 LAB — VAS US CAROTID
LEFT ECA DIAS: -17 cm/s
LEFT VERTEBRAL DIAS: -15 cm/s
LICADDIAS: -28 cm/s
LICAPDIAS: -16 cm/s
LICAPSYS: -74 cm/s
Left CCA dist dias: -18 cm/s
Left CCA dist sys: -63 cm/s
Left CCA prox dias: 23 cm/s
Left CCA prox sys: 98 cm/s
Left ICA dist sys: -101 cm/s
RIGHT ECA DIAS: -16 cm/s
RIGHT VERTEBRAL DIAS: -10 cm/s
Right CCA prox dias: 21 cm/s
Right CCA prox sys: 105 cm/s
Right cca dist sys: -100 cm/s

## 2016-12-29 ENCOUNTER — Encounter: Payer: Self-pay | Admitting: *Deleted

## 2017-02-09 ENCOUNTER — Other Ambulatory Visit: Payer: Self-pay | Admitting: Primary Care

## 2017-02-09 ENCOUNTER — Telehealth: Payer: Self-pay | Admitting: Primary Care

## 2017-02-09 MED ORDER — VERAPAMIL HCL ER 360 MG PO CP24: 360.0000 mg | ORAL_CAPSULE | Freq: Every day | ORAL | 0 refills | Status: DC

## 2017-02-09 NOTE — Telephone Encounter (Signed)
Copied from [place]. Topic: Inquiry >> Feb 09, 2017  1:54 PM Pricilla Handler wrote: Reason for CRM: Patient called requesting a refill of verapamil (VERELAN PM) 360 MG 24 hr capsule ASAP. Please call patient once refill is completed.

## 2017-02-09 NOTE — Addendum Note (Signed)
Addended by: Jearld Fenton on: 02/09/2017 03:34 PM   Modules accepted: Orders

## 2017-02-09 NOTE — Telephone Encounter (Signed)
Medication sent to pharmacy  

## 2017-02-09 NOTE — Telephone Encounter (Signed)
Copied from [place]. Topic: Inquiry >> Feb 09, 2017  1:54 PM Pricilla Handler wrote: Reason for CRM: Patient called requesting a refill of verapamil (VERELAN PM) 360 MG 24 hr capsule ASAP. Please call patient once refill is completed.

## 2017-02-09 NOTE — Telephone Encounter (Signed)
Copied from [place]. Topic: Inquiry >> Feb 09, 2017  1:54 PM Pricilla Handler wrote: Reason for CRM: Patient called requesting a refill of verapamil (VERELAN PM) 360 MG 24 hr capsule ASAP. Please call patient once refill is completed.

## 2017-04-09 ENCOUNTER — Other Ambulatory Visit: Payer: Self-pay | Admitting: *Deleted

## 2017-04-09 DIAGNOSIS — I6529 Occlusion and stenosis of unspecified carotid artery: Secondary | ICD-10-CM

## 2017-06-26 ENCOUNTER — Ambulatory Visit: Payer: Federal, State, Local not specified - PPO | Admitting: Primary Care

## 2017-06-26 VITALS — BP 132/76 | HR 68 | Temp 98.2°F | Ht 65.0 in | Wt 247.8 lb

## 2017-06-26 DIAGNOSIS — I10 Essential (primary) hypertension: Secondary | ICD-10-CM

## 2017-06-26 DIAGNOSIS — R7303 Prediabetes: Secondary | ICD-10-CM | POA: Diagnosis not present

## 2017-06-26 DIAGNOSIS — R011 Cardiac murmur, unspecified: Secondary | ICD-10-CM

## 2017-06-26 DIAGNOSIS — R0602 Shortness of breath: Secondary | ICD-10-CM | POA: Diagnosis not present

## 2017-06-26 LAB — BASIC METABOLIC PANEL
BUN: 12 mg/dL (ref 6–23)
CALCIUM: 9.4 mg/dL (ref 8.4–10.5)
CO2: 32 mEq/L (ref 19–32)
Chloride: 103 mEq/L (ref 96–112)
Creatinine, Ser: 0.8 mg/dL (ref 0.40–1.20)
GFR: 93.34 mL/min (ref >60.00)
Glucose, Bld: 96 mg/dL (ref 70–99)
Potassium: 3.9 mEq/L (ref 3.5–5.1)
SODIUM: 140 meq/L (ref 135–145)

## 2017-06-26 LAB — LIPID PANEL
Cholesterol: 179 mg/dL (ref 0–200)
HDL: 58.7 mg/dL (ref >39.00)
LDL CALC: 105 mg/dL — AB (ref 0–99)
NONHDL: 120.12
TRIGLYCERIDES: 77 mg/dL (ref 0.0–149.0)
Total CHOL/HDL Ratio: 3
VLDL: 15.4 mg/dL (ref 0.0–40.0)

## 2017-06-26 LAB — HEMOGLOBIN A1C: HEMOGLOBIN A1C: 5.9 % (ref 4.6–6.5)

## 2017-06-26 NOTE — Progress Notes (Signed)
Subjective:    Patient ID: Paige Hester, female    DOB: September 20, 1955, 62 y.o.   MRN: [id number]  HPI  Paige Hester is a 62 year old female who presents today for follow up and form for BP cuff.  1) Essential Hypertension: Currently managed on losartan-HCTZ 50-12.5 mg and verapamil ER 360 mg. She is requesting a blood pressure cuff for home testing and has a form with her today.   She has noticed shortness of breath when walking for prolonged periods of time, also during exercise. She last had an echocardiogram in 2014 with LV EF of 65-70%, thickening of LV, moderate diastolic dysfunction all consistent with hypertension; mild aortic and mitral valve calcification mild mitral and trivial tricuspid regurgitation   BP Readings from Last 3 Encounters:  06/26/17 132/76  11/04/16 122/72  12/20/15 122/68     2) Prediabetes: A1C of 6.1 in August 2018. She's been in a diabetic prevention class since August 2018. Topics discussed include diabetes prevention, nutrition, exercise.   Diet currently consists of:  Breakfast: Oatmeal, fruit Lunch: Sandwich, fruit Dinner: Salad, chicken, some fish Snacks: Candy, chips Desserts: Twice weekly Beverages: Coffee, water, occasional regular soda, 2-3 glasses of wine weekly  Exercise: Walking    Review of Systems  Constitutional: Negative for fatigue.  Respiratory: Negative for cough.        Exertional shortness of breath during walking and exercise.  Cardiovascular: Negative for chest pain and leg swelling.  Neurological: Negative for dizziness, numbness and headaches.       Past Medical History:  Diagnosis Date  . Gallbladder polyp 2012  . Hypertension   . Iron deficiency anemia   . Menopausal symptoms    since 2007  . Murmur, cardiac   . Vitamin D deficiency      Social History   Socioeconomic History  . Marital status: Married    Spouse name: Not on file  . Number of children: Not on file  . Years of education: Not on  file  . Highest education level: Not on file  Occupational History  . Not on file  Social Needs  . Financial resource strain: Not on file  . Food insecurity:    Worry: Not on file    Inability: Not on file  . Transportation needs:    Medical: Not on file    Non-medical: Not on file  Tobacco Use  . Smoking status: Former Research scientist (life sciences)  . Smokeless tobacco: Never Used  Substance and Sexual Activity  . Alcohol use: Yes    Alcohol/week: 4.2 oz    Types: 7 Glasses of wine per week  . Drug use: No  . Sexual activity: Yes  Lifestyle  . Physical activity:    Days per week: Not on file    Minutes per session: Not on file  . Stress: Not on file  Relationships  . Social connections:    Talks on phone: Not on file    Gets together: Not on file    Attends religious service: Not on file    Active member of club or organization: Not on file    Attends meetings of clubs or organizations: Not on file    Relationship status: Not on file  . Intimate partner violence:    Fear of current or ex partner: Not on file    Emotionally abused: Not on file    Physically abused: Not on file    Forced sexual activity: Not on file  Other Topics  Concern  . Not on file  Social History Narrative   Married.   1 child, 1 grandchild.   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   Enjoys going to [employer], singing.     No past surgical history on file.  Family History  Problem Relation Age of Onset  . Heart disease Mother   . Hypertension Mother   . Colon cancer Maternal Aunt   . Heart attack Mother 107  . Prostate cancer Father   . Heart disease Father 65       CAD, STENT  . Heart disease Brother        Myocardial infarction  . Coronary artery disease Sister     No Known Allergies  Current Outpatient Medications on File Prior to Visit  Medication Sig Dispense Refill  . aspirin 81 MG tablet Take 81 mg by mouth daily.    . Cholecalciferol (VITAMIN D3) 1000 UNITS  tablet Take 1,000 Units by mouth daily.      Marland Kitchen losartan-hydrochlorothiazide (HYZAAR) 50-12.5 MG tablet Take 1 tablet by mouth daily. 90 tablet 3  . omeprazole (PRILOSEC) 20 MG capsule Take 20 mg by mouth daily.    . ranitidine (ZANTAC) 150 MG tablet Take 150 mg by mouth 2 (two) times daily.    . verapamil (VERELAN PM) 360 MG 24 hr capsule Take 1 capsule (360 mg total) by mouth at bedtime. 90 capsule 0  . fluticasone (FLONASE) 50 MCG/ACT nasal spray Place 2 sprays into both nostrils daily. (Patient not taking: Reported on 06/26/2017) 16 g 6  . ibuprofen (ADVIL,MOTRIN) 600 MG tablet Take 1 tablet (600 mg total) by mouth 2 (two) times daily as needed. (Patient not taking: Reported on 06/26/2017) 30 tablet 0   No current facility-administered medications on file prior to visit.     BP 132/76   Pulse 68   Temp 98.2 F (36.8 C) (Oral)   Ht 5\' 5"  (1.651 m)   Wt 247 lb 12.8 oz (112.4 kg)   SpO2 98%   BMI 41.24 kg/m    Objective:   Physical Exam  Constitutional: She appears well-nourished.  Neck: Neck supple.  Cardiovascular: Normal rate and regular rhythm.  Murmur heard. Pulmonary/Chest: Effort normal and breath sounds normal. She has no wheezes. She has no rales.  Skin: Skin is warm and dry.          Assessment & Plan:

## 2017-06-26 NOTE — Assessment & Plan Note (Signed)
Repeat A1C pending. Discussed recommendations for improvement in diet. Recommend to increase exercise as tolerated. Repeat echocardiogram pending.

## 2017-06-26 NOTE — Assessment & Plan Note (Signed)
Stable in the office today, continue Hyzaar and verapamil.

## 2017-06-26 NOTE — Assessment & Plan Note (Signed)
Increased exertional SOB, also with exercise. This could very well be secondary to deconditioning due to obesity but given murmur and symptoms will repeat with echocardiogram.   Continue BP control.

## 2017-06-26 NOTE — Patient Instructions (Signed)
Continue to work on your diet by increasing vegetables, fruit, whole grains, lean protein.  Limit candy/sweets, chips.  Ensure you are consuming 64 ounces of water daily.  Start monitoring your blood pressure and report readings that are consistently at or above 135/90.  Stop by the lab prior to leaving today. I will notify you of your results once received.   You will be contacted regarding your echocardiogram.  Please let us know if you have not been contacted within one week.   It was a pleasure to see you today!

## 2017-07-02 ENCOUNTER — Telehealth: Payer: Self-pay | Admitting: Primary Care

## 2017-07-02 NOTE — Telephone Encounter (Signed)
Copied from [place] [id number]. Topic: Quick Communication - See Telephone Encounter >> Jul 02, 2017  5:07 PM Rutherford Nail, NT wrote: CRM for notification. See Telephone encounter for: 07/02/17. Patient calling and states that she lost the paperwork Dr Carlis Abbott signed for her free blood pressure cuff. Patient would like to drop this paperwork off on Monday (07/06/17) and pick up that evening. Please advise.

## 2017-07-06 ENCOUNTER — Other Ambulatory Visit: Payer: Federal, State, Local not specified - PPO

## 2017-07-06 NOTE — Telephone Encounter (Signed)
It isn't up front

## 2017-07-06 NOTE — Telephone Encounter (Signed)
Form completed and placed in Chan's inbox. Will need to measure arm circumference.

## 2017-07-06 NOTE — Telephone Encounter (Signed)
I do not see where this paperwork has been dropped off. Raquel Sarna, do you see it up front?

## 2017-07-06 NOTE — Telephone Encounter (Signed)
Placed form in Kate's inbox 

## 2017-07-07 NOTE — Telephone Encounter (Signed)
Given paperwork to patient and measured her arm at 17"

## 2017-07-09 ENCOUNTER — Encounter: Payer: Self-pay | Admitting: Primary Care

## 2017-07-09 DIAGNOSIS — I10 Essential (primary) hypertension: Secondary | ICD-10-CM

## 2017-07-10 MED ORDER — HYDROCHLOROTHIAZIDE 12.5 MG PO CAPS: ORAL_CAPSULE | ORAL | 2 refills | Status: DC

## 2017-07-10 MED ORDER — LOSARTAN POTASSIUM 50 MG PO TABS: ORAL_TABLET | ORAL | 2 refills | Status: DC

## 2017-07-14 ENCOUNTER — Encounter: Payer: Self-pay | Admitting: Primary Care

## 2017-07-16 ENCOUNTER — Other Ambulatory Visit: Payer: Self-pay

## 2017-07-16 ENCOUNTER — Ambulatory Visit (INDEPENDENT_AMBULATORY_CARE_PROVIDER_SITE_OTHER): Payer: Federal, State, Local not specified - PPO

## 2017-07-16 DIAGNOSIS — R0602 Shortness of breath: Secondary | ICD-10-CM | POA: Diagnosis not present

## 2017-07-16 DIAGNOSIS — R011 Cardiac murmur, unspecified: Secondary | ICD-10-CM | POA: Diagnosis not present

## 2017-08-07 ENCOUNTER — Encounter: Payer: Self-pay | Admitting: Family Medicine

## 2017-08-07 ENCOUNTER — Ambulatory Visit (INDEPENDENT_AMBULATORY_CARE_PROVIDER_SITE_OTHER): Payer: Federal, State, Local not specified - PPO | Admitting: Family Medicine

## 2017-08-07 DIAGNOSIS — R0981 Nasal congestion: Secondary | ICD-10-CM

## 2017-08-07 MED ORDER — FLUTICASONE PROPIONATE 50 MCG/ACT NA SUSP: 2.0000 | Freq: Every day | NASAL | 2 refills | Status: DC

## 2017-08-07 MED ORDER — AMOXICILLIN-POT CLAVULANATE 875-125 MG PO TABS: 1.0000 | ORAL_TABLET | Freq: Two times a day (BID) | ORAL | 0 refills | Status: DC

## 2017-08-07 NOTE — Patient Instructions (Addendum)
Check your BP at home and update Clark if persistently >140/>90.   Rest and fluids.  Restart flonase.  If you have a fever >100.4, a lot more facial pain, or tooth pain, then start the antibiotics.  I think this is unlikely to be an issue- hold the rx for now.  Take care.  Glad to see you.

## 2017-08-07 NOTE — Progress Notes (Signed)
Sx started about 3-4 days ago.  Some cough and sputum.  Scratchy throat.  Voice is hoarse.  She feels better today, with less scratchy throat.  No vomiting, no diarrhea.  No rash.  No facial pain.  No ear pain.  Still stuffy.  Hadn't used flonase recently.    Recheck BP 138/70. D/w pt.  See AVS.    Meds, vitals, and allergies reviewed.   ROS: Per HPI unless specifically indicated in ROS section   GEN: nad, alert and oriented HEENT: mucous membranes moist, tm w/o erythema, nasal exam w/o erythema but stuffy, clear discharge noted,  OP with cobblestoning NECK: supple w/o LA CV: rrr.  SEM noted.  PULM: ctab, no inc wob EXT: no edema SKIN: no acute rash R max sinus mildly ttp

## 2017-08-09 DIAGNOSIS — R0981 Nasal congestion: Secondary | ICD-10-CM | POA: Insufficient documentation

## 2017-08-09 NOTE — Assessment & Plan Note (Signed)
Rest and fluids.  Restart flonase.  If a fever >100.4, a lot more facial pain, or tooth pain, then start the augmentin.  I think this is unlikely to be an issue- she'll hold the rx for now.  Update [place] as needed otherwise.  She agrees.

## 2017-09-15 ENCOUNTER — Other Ambulatory Visit: Payer: Self-pay | Admitting: Internal Medicine

## 2017-12-02 ENCOUNTER — Telehealth: Payer: Self-pay | Admitting: *Deleted

## 2017-12-02 NOTE — Telephone Encounter (Signed)
Please notify patient that she is due to have this done and I recommend she proceed.

## 2017-12-02 NOTE — Telephone Encounter (Signed)
Copied from [place] [id number]. Topic: General - Other >> Dec 02, 2017  2:53 PM Bea Graff, NT wrote: Reason for CRM: Pt states she received a call from cardiology stating it was time for her to schedule her annual carotid artery [place] and she is wanting to verify with Alma Friendly that this is done on a yearly basis.

## 2017-12-04 NOTE — Telephone Encounter (Signed)
Spoken and notified patient of Tawni Millers comments. Patient verbalized understanding.  Patient will call the cardiologist and have this done.

## 2018-01-01 ENCOUNTER — Ambulatory Visit (INDEPENDENT_AMBULATORY_CARE_PROVIDER_SITE_OTHER): Payer: Federal, State, Local not specified - PPO

## 2018-01-01 DIAGNOSIS — I6529 Occlusion and stenosis of unspecified carotid artery: Secondary | ICD-10-CM

## 2018-01-03 ENCOUNTER — Other Ambulatory Visit: Payer: Self-pay | Admitting: Primary Care

## 2018-04-22 ENCOUNTER — Encounter: Payer: Self-pay | Admitting: Gastroenterology

## 2018-05-18 ENCOUNTER — Encounter: Payer: Self-pay | Admitting: Primary Care

## 2018-05-31 ENCOUNTER — Other Ambulatory Visit: Payer: Self-pay | Admitting: Primary Care

## 2018-05-31 DIAGNOSIS — I10 Essential (primary) hypertension: Secondary | ICD-10-CM

## 2018-07-21 ENCOUNTER — Other Ambulatory Visit: Payer: Self-pay | Admitting: Primary Care

## 2018-08-16 ENCOUNTER — Other Ambulatory Visit: Payer: Self-pay

## 2018-08-16 ENCOUNTER — Encounter: Payer: Self-pay | Admitting: Primary Care

## 2018-08-16 ENCOUNTER — Ambulatory Visit: Payer: Federal, State, Local not specified - PPO | Admitting: Primary Care

## 2018-08-16 VITALS — BP 134/80 | HR 76 | Temp 98.2°F | Wt 267.5 lb

## 2018-08-16 DIAGNOSIS — N939 Abnormal uterine and vaginal bleeding, unspecified: Secondary | ICD-10-CM | POA: Diagnosis not present

## 2018-08-16 DIAGNOSIS — N950 Postmenopausal bleeding: Secondary | ICD-10-CM | POA: Insufficient documentation

## 2018-08-16 LAB — POC URINALSYSI DIPSTICK (AUTOMATED)
Bilirubin, UA: NEGATIVE
Blood, UA: NEGATIVE
Glucose, UA: NEGATIVE
Ketones, UA: NEGATIVE
Leukocytes, UA: NEGATIVE
Nitrite, UA: NEGATIVE
Protein, UA: NEGATIVE
Spec Grav, UA: 1.02 (ref 1.010–1.025)
Urobilinogen, UA: 0.2 E.U./dL
pH, UA: 6 (ref 5.0–8.0)

## 2018-08-16 NOTE — Progress Notes (Signed)
Subjective:    Patient ID: Paige Hester, female    DOB: [birth year], 63 y.o.   MRN: [id number]  HPI  Paige Hester is a 63 year old female with a history of hypertension, menopause who presents today with a chief complaint of vaginal bleeding.   She's noticed vaginal spotting intermittently since early February 2020. She had two episodes of spotting in February 2020 and one episode in early May 2020. She endorses a history of abnormal pap smears in her early 20's, none since. She does have her uterus and ovaries.   She denies unexplained weight loss, night sweats, dysuria, urinary frequency, pelvic pain/pressure, vaginal discharge. She isn't concerned for STD's. Her first cousin was recently diagnosed with "cancer" to the pelvis. She is unsure of which type.   Review of Systems  Constitutional: Negative for appetite change, diaphoresis and unexpected weight change.  Gastrointestinal: Negative for abdominal pain.  Genitourinary: Positive for vaginal bleeding. Negative for dysuria, frequency, pelvic pain and vaginal discharge.       Past Medical History:  Diagnosis Date  . Gallbladder polyp 2012  . Hypertension   . Iron deficiency anemia   . Menopausal symptoms    since 2007  . Murmur, cardiac   . Vitamin D deficiency      Social History   Socioeconomic History  . Marital status: Married    Spouse name: Not on file  . Number of children: Not on file  . Years of education: Not on file  . Highest education level: Not on file  Occupational History  . Not on file  Social Needs  . Financial resource strain: Not on file  . Food insecurity:    Worry: Not on file    Inability: Not on file  . Transportation needs:    Medical: Not on file    Non-medical: Not on file  Tobacco Use  . Smoking status: Former Research scientist (life sciences)  . Smokeless tobacco: Never Used  Substance and Sexual Activity  . Alcohol use: Yes    Alcohol/week: 7.0 standard drinks    Types: 7 Glasses of wine per week   . Drug use: No  . Sexual activity: Yes  Lifestyle  . Physical activity:    Days per week: Not on file    Minutes per session: Not on file  . Stress: Not on file  Relationships  . Social connections:    Talks on phone: Not on file    Gets together: Not on file    Attends religious service: Not on file    Active member of club or organization: Not on file    Attends meetings of clubs or organizations: Not on file    Relationship status: Not on file  . Intimate partner violence:    Fear of current or ex partner: Not on file    Emotionally abused: Not on file    Physically abused: Not on file    Forced sexual activity: Not on file  Other Topics Concern  . Not on file  Social History Narrative   Married.   1 child, 1 grandchild.   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   Enjoys going to [employer], singing.     No past surgical history on file.  Family History  Problem Relation Age of Onset  . Heart disease Mother   . Hypertension Mother   . Colon cancer Maternal Aunt   . Heart attack Mother 87  . Prostate cancer  Father   . Heart disease Father 109       CAD, STENT  . Heart disease Brother        Myocardial infarction  . Coronary artery disease Sister     No Known Allergies  Current Outpatient Medications on File Prior to Visit  Medication Sig Dispense Refill  . aspirin 81 MG tablet Take 81 mg by mouth daily.    . Cholecalciferol (VITAMIN D3) 1000 UNITS tablet Take 1,000 Units by mouth daily.      . fluticasone (FLONASE) 50 MCG/ACT nasal spray Place 2 sprays into both nostrils daily. 16 g 2  . hydrochlorothiazide (MICROZIDE) 12.5 MG capsule TAKE 1 CAPSULE BY MOUTH ONCE DAILY FOR BLOOD PRESSURE NEED APPOINTMENT FOR ANY MORE REFILLS 90 capsule 0  . omeprazole (PRILOSEC) 20 MG capsule Take 20 mg by mouth daily.    . verapamil (VERELAN PM) 360 MG 24 hr capsule Take 1 capsule (360 mg total) by mouth at bedtime. NEED APPOINTMENT FOR ANY  MORE REFILLS 30 capsule 0   No current facility-administered medications on file prior to visit.     BP 134/80   Pulse 76   Temp 98.2 F (36.8 C) (Oral)   Wt 267 lb 8 oz (121.3 kg)   SpO2 97%   BMI 44.51 kg/m    Objective:   Physical Exam  Constitutional: She appears well-nourished.  Respiratory: Effort normal.  Genitourinary: There is no tenderness on the right labia. There is no tenderness on the left labia. Cervix exhibits discharge. Cervix exhibits no motion tenderness.    Vaginal discharge present.     No vaginal erythema.  No erythema in the vagina.    Genitourinary Comments: Moderate amount of whitish vaginal discharge. No foul smell. No bleeding, obvious masses, lesions.    Psychiatric: She has a normal mood and affect.           Assessment & Plan:

## 2018-08-16 NOTE — Patient Instructions (Addendum)
Stop by the front desk and speak with [facility] regarding your ultrasound.   Please schedule a physical with me in June or July 2020 You may also schedule a lab only appointment 3-4 days prior. We will discuss your lab results in detail during your physical.  It was a pleasure to see you today!

## 2018-08-16 NOTE — Assessment & Plan Note (Signed)
Intermittent since February 2020, has been menopausal for years. Pelvic exam today with some discharge, overall unremarkable.  Wet prep sent off for testing. UA today with  Orders placed for pelvic/transvaginal ultrasound for further evaluation.

## 2018-08-17 ENCOUNTER — Other Ambulatory Visit: Payer: Self-pay | Admitting: Primary Care

## 2018-08-17 DIAGNOSIS — B9689 Other specified bacterial agents as the cause of diseases classified elsewhere: Secondary | ICD-10-CM

## 2018-08-17 LAB — WET PREP BY MOLECULAR PROBE
Candida species: NOT DETECTED
MICRO NUMBER:: 523893
SPECIMEN QUALITY:: ADEQUATE
Trichomonas vaginosis: NOT DETECTED

## 2018-08-17 MED ORDER — METRONIDAZOLE 0.75 % VA GEL: 1.0000 | Freq: Two times a day (BID) | VAGINAL | 0 refills | Status: DC

## 2018-08-18 ENCOUNTER — Other Ambulatory Visit: Payer: Self-pay | Admitting: Primary Care

## 2018-08-18 ENCOUNTER — Other Ambulatory Visit: Payer: Self-pay | Admitting: Family Medicine

## 2018-08-18 DIAGNOSIS — Z1159 Encounter for screening for other viral diseases: Secondary | ICD-10-CM

## 2018-08-18 DIAGNOSIS — I10 Essential (primary) hypertension: Secondary | ICD-10-CM

## 2018-08-18 DIAGNOSIS — J302 Other seasonal allergic rhinitis: Secondary | ICD-10-CM

## 2018-08-18 DIAGNOSIS — R7303 Prediabetes: Secondary | ICD-10-CM

## 2018-08-18 NOTE — Telephone Encounter (Signed)
Refill sent to pharmacy.   

## 2018-08-18 NOTE — Telephone Encounter (Signed)
Belenda Cruise please advise okay to refill was refilled by Dr. Damita Dunnings 1 year ago for sickenss.

## 2018-08-25 ENCOUNTER — Telehealth: Payer: Self-pay | Admitting: *Deleted

## 2018-08-25 NOTE — Telephone Encounter (Signed)
Patient left a voicemail stating that she is scheduled for a vaginal ultrasound tomorrow. Patient stated that she started bleeding last night and wants to know if she should still go for the test tomorrow? Patient stated that she has been using vaginal medication since August 17, 2018.

## 2018-08-25 NOTE — Telephone Encounter (Signed)
Per DPR, left detail message of Kate Clark's comments for patient. 

## 2018-08-25 NOTE — Telephone Encounter (Signed)
Please notify patient that she does still need to go for the vaginal ultrasound. I'll be in touch once I receive the results.

## 2018-08-26 ENCOUNTER — Other Ambulatory Visit: Payer: Self-pay | Admitting: Primary Care

## 2018-08-26 ENCOUNTER — Other Ambulatory Visit: Payer: Self-pay

## 2018-08-26 ENCOUNTER — Ambulatory Visit
Admission: RE | Admit: 2018-08-26 | Discharge: 2018-08-26 | Disposition: A | Discharge status: Home / Self Care | Payer: Federal, State, Local not specified - PPO | Source: Ambulatory Visit | Attending: Primary Care | Admitting: Primary Care

## 2018-08-26 DIAGNOSIS — N939 Abnormal uterine and vaginal bleeding, unspecified: Secondary | ICD-10-CM

## 2018-08-26 DIAGNOSIS — R9389 Abnormal findings on diagnostic imaging of other specified body structures: Secondary | ICD-10-CM | POA: Diagnosis not present

## 2018-08-27 ENCOUNTER — Other Ambulatory Visit (INDEPENDENT_AMBULATORY_CARE_PROVIDER_SITE_OTHER): Payer: Federal, State, Local not specified - PPO

## 2018-08-27 DIAGNOSIS — R7303 Prediabetes: Secondary | ICD-10-CM

## 2018-08-27 DIAGNOSIS — Z1159 Encounter for screening for other viral diseases: Secondary | ICD-10-CM | POA: Diagnosis not present

## 2018-08-27 DIAGNOSIS — I10 Essential (primary) hypertension: Secondary | ICD-10-CM

## 2018-08-27 LAB — COMPREHENSIVE METABOLIC PANEL
ALT: 10 U/L (ref 0–35)
AST: 15 U/L (ref 0–37)
Albumin: 4 g/dL (ref 3.5–5.2)
Alkaline Phosphatase: 71 U/L (ref 39–117)
BUN: 9 mg/dL (ref 6–23)
CO2: 31 mEq/L (ref 19–32)
Calcium: 9.6 mg/dL (ref 8.4–10.5)
Chloride: 103 mEq/L (ref 96–112)
Creatinine, Ser: 0.79 mg/dL (ref 0.40–1.20)
GFR: 88.77 mL/min (ref >60.00)
Glucose, Bld: 98 mg/dL (ref 70–99)
Potassium: 3.8 mEq/L (ref 3.5–5.1)
Sodium: 140 mEq/L (ref 135–145)
Total Bilirubin: 0.4 mg/dL (ref 0.2–1.2)
Total Protein: 7.3 g/dL (ref 6.0–8.3)

## 2018-08-27 LAB — LIPID PANEL
Cholesterol: 176 mg/dL (ref 0–200)
HDL: 63.7 mg/dL (ref >39.00)
LDL Cholesterol: 100 mg/dL — ABNORMAL HIGH (ref 0–99)
NonHDL: 111.95
Total CHOL/HDL Ratio: 3
Triglycerides: 61 mg/dL (ref 0.0–149.0)
VLDL: 12.2 mg/dL (ref 0.0–40.0)

## 2018-08-27 LAB — HEMOGLOBIN A1C: Hgb A1c MFr Bld: 6.2 % (ref 4.6–6.5)

## 2018-08-28 ENCOUNTER — Other Ambulatory Visit: Payer: Self-pay | Admitting: Primary Care

## 2018-08-30 LAB — HEPATITIS C ANTIBODY
Hepatitis C Ab: NONREACTIVE
SIGNAL TO CUT-OFF: 0.01 (ref <1.00)

## 2018-09-01 ENCOUNTER — Encounter: Payer: Self-pay | Admitting: Primary Care

## 2018-09-01 ENCOUNTER — Ambulatory Visit (INDEPENDENT_AMBULATORY_CARE_PROVIDER_SITE_OTHER): Payer: Federal, State, Local not specified - PPO | Admitting: Primary Care

## 2018-09-01 ENCOUNTER — Other Ambulatory Visit: Payer: Self-pay

## 2018-09-01 VITALS — BP 136/84 | HR 66 | Temp 98.0°F | Ht 65.0 in | Wt 268.5 lb

## 2018-09-01 DIAGNOSIS — H8113 Benign paroxysmal vertigo, bilateral: Secondary | ICD-10-CM | POA: Diagnosis not present

## 2018-09-01 DIAGNOSIS — Z1239 Encounter for other screening for malignant neoplasm of breast: Secondary | ICD-10-CM | POA: Diagnosis not present

## 2018-09-01 DIAGNOSIS — K219 Gastro-esophageal reflux disease without esophagitis: Secondary | ICD-10-CM | POA: Diagnosis not present

## 2018-09-01 DIAGNOSIS — Z0000 Encounter for general adult medical examination without abnormal findings: Secondary | ICD-10-CM

## 2018-09-01 DIAGNOSIS — Z1211 Encounter for screening for malignant neoplasm of colon: Secondary | ICD-10-CM

## 2018-09-01 DIAGNOSIS — N939 Abnormal uterine and vaginal bleeding, unspecified: Secondary | ICD-10-CM

## 2018-09-01 DIAGNOSIS — R7303 Prediabetes: Secondary | ICD-10-CM

## 2018-09-01 DIAGNOSIS — I10 Essential (primary) hypertension: Secondary | ICD-10-CM

## 2018-09-01 NOTE — Assessment & Plan Note (Signed)
Doing well on omeprazole every other day.  Continue same. Encouraged weight loss.

## 2018-09-01 NOTE — Progress Notes (Signed)
Subjective:    Patient ID: Paige Hester, female    DOB: 05-Dec-1955, 63 y.o.   MRN: [id number]  HPI  Paige Hester is a 63 year old female who presents today for complete physical. She would also like to discuss difficulty sleeping.  She is having trouble falling and staying asleep. She will sleep 3-4 hours nightly as she cannot fall back asleep when waking. She's tried Melatonin OTC without much improvement. She watches TV before bed, doesn't drink much caffeine but will mostly drink with breakfast.   Immunizations: -Tetanus: Completed in 2014 -Influenza: Due this season  -Shingles: Never completed   Diet: She endorses a fair diet. Eats fruit, oatmeal, some vegetables/salads, chicken, cakes/cookies. She is drinking mostly water, occasional soda/juice Exercise: She is not exercising  Eye exam: Completed in 2019 Dental exam: No recent exam Colonoscopy: Completed in 2010 Pap Smear: Completed in 2017, will be getting this done next week through GYN. Mammogram: No recent mammogram  Hep C Screen: Negative  BP Readings from Last 3 Encounters:  09/01/18 136/84  08/16/18 134/80  08/07/17 138/70   She is checking her BP infrequently at home which is running 120/80's.   Review of Systems  Constitutional: Negative for unexpected weight change.  HENT: Negative for rhinorrhea.   Respiratory: Negative for cough and shortness of breath.   Cardiovascular: Negative for chest pain.  Gastrointestinal: Negative for constipation and diarrhea.  Genitourinary: Negative for difficulty urinating.       Post menopausal bleeding  Musculoskeletal: Negative for arthralgias and myalgias.  Skin: Negative for rash.  Allergic/Immunologic: Negative for environmental allergies.  Neurological: Negative for dizziness, numbness and headaches.  Psychiatric/Behavioral: Positive for sleep disturbance.       Past Medical History:  Diagnosis Date  . Gallbladder polyp 2012  . Hypertension   . Iron  deficiency anemia   . Menopausal symptoms    since 2007  . Murmur, cardiac   . Vitamin D deficiency      Social History   Socioeconomic History  . Marital status: Married    Spouse name: Not on file  . Number of children: Not on file  . Years of education: Not on file  . Highest education level: Not on file  Occupational History  . Not on file  Social Needs  . Financial resource strain: Not on file  . Food insecurity    Worry: Not on file    Inability: Not on file  . Transportation needs    Medical: Not on file    Non-medical: Not on file  Tobacco Use  . Smoking status: Former Research scientist (life sciences)  . Smokeless tobacco: Never Used  Substance and Sexual Activity  . Alcohol use: Yes    Alcohol/week: 7.0 standard drinks    Types: 7 Glasses of wine per week  . Drug use: No  . Sexual activity: Yes  Lifestyle  . Physical activity    Days per week: Not on file    Minutes per session: Not on file  . Stress: Not on file  Relationships  . Social Herbalist on phone: Not on file    Gets together: Not on file    Attends religious service: Not on file    Active member of club or organization: Not on file    Attends meetings of clubs or organizations: Not on file    Relationship status: Not on file  . Intimate partner violence    Fear of current or ex partner:  Not on file    Emotionally abused: Not on file    Physically abused: Not on file    Forced sexual activity: Not on file  Other Topics Concern  . Not on file  Social History Narrative   Married.   1 child, 1 grandchild.   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   Enjoys going to [employer], singing.     No past surgical history on file.  Family History  Problem Relation Age of Onset  . Heart disease Mother   . Hypertension Mother   . Colon cancer Maternal Aunt   . Heart attack Mother 33  . Prostate cancer Father   . Heart disease Father 78       CAD, STENT  . Heart disease  Brother        Myocardial infarction  . Coronary artery disease Sister     No Known Allergies  Current Outpatient Medications on File Prior to Visit  Medication Sig Dispense Refill  . aspirin 81 MG tablet Take 81 mg by mouth daily.    . Cholecalciferol (VITAMIN D3) 1000 UNITS tablet Take 1,000 Units by mouth daily.      . fluticasone (FLONASE) 50 MCG/ACT nasal spray Place 1 spray into both nostrils 2 (two) times daily as needed for allergies or rhinitis. 16 g 0  . hydrochlorothiazide (MICROZIDE) 12.5 MG capsule TAKE 1 CAPSULE BY MOUTH ONCE DAILY FOR BLOOD PRESSURE NEED APPOINTMENT FOR ANY MORE REFILLS 90 capsule 0  . losartan (COZAAR) 50 MG tablet Take 50 mg by mouth daily.    Marland Kitchen omeprazole (PRILOSEC) 20 MG capsule Take 20 mg by mouth daily.    . verapamil (VERELAN PM) 360 MG 24 hr capsule Take 1 capsule (360 mg total) by mouth at bedtime. 30 capsule 0   No current facility-administered medications on file prior to visit.     BP 136/84   Pulse 66   Temp 98 F (36.7 C) (Tympanic)   Ht 5\' 5"  (1.651 m)   Wt 268 lb 8 oz (121.8 kg)   SpO2 97%   BMI 44.68 kg/m    Objective:   Physical Exam  Constitutional: She is oriented to person, place, and time. She appears well-nourished.  HENT:  Mouth/Throat: No oropharyngeal exudate.  Eyes: Pupils are equal, round, and reactive to light. EOM are normal.  Neck: Neck supple. No thyromegaly present.  Cardiovascular: Normal rate and regular rhythm.  Respiratory: Effort normal and breath sounds normal.  GI: Soft. Bowel sounds are normal. There is no abdominal tenderness.  Musculoskeletal: Normal range of motion.  Neurological: She is alert and oriented to person, place, and time.  Skin: Skin is warm and dry.  Psychiatric: She has a normal mood and affect.           Assessment & Plan:

## 2018-09-01 NOTE — Assessment & Plan Note (Signed)
No recent episodes  Continue to monitor

## 2018-09-01 NOTE — Assessment & Plan Note (Signed)
Increase in A1C to 6.2 on recent labs. Long discussion about the need to work on diet and to start with regular exercise. Repeat in 6 months.

## 2018-09-01 NOTE — Patient Instructions (Signed)
Start monitoring your blood pressure daily, around the same time of day, for the next 2-3 weeks.  Ensure that you have rested for 30 minutes prior to checking your blood pressure. Record your readings and notify me if you notice your blood pressure at or above 130/90.  We will contact you regarding the shingles vaccination.   You will be contacted regarding your referral to GI for the colonoscopy.  Please let us know if you have not been contacted within one week.   [facility] to schedule your mammogram.   It's important to improve your diet by reducing consumption of fast food, fried food, processed snack foods, sugary drinks. Increase consumption of fresh vegetables and fruits, whole grains, water.  Ensure you are drinking 64 ounces of water daily.  Start exercising. You should be getting 150 minutes of moderate intensity exercise weekly.  Schedule a lab only appointment for 6 months to repeat your diabetes test.  It was a pleasure to see you today!   Preventive Care 40-64 Years, Female Preventive care refers to lifestyle choices and visits with your health care provider that can promote health and wellness. What does preventive care include?   A yearly physical exam. This is also called an annual well check.  Dental exams once or twice a year.  Routine eye exams. Ask your health care provider how often you should have your eyes checked.  Personal lifestyle choices, including: ? Daily care of your teeth and gums. ? Regular physical activity. ? Eating a healthy diet. ? Avoiding tobacco and drug use. ? Limiting alcohol use. ? Practicing safe sex. ? Taking low-dose aspirin daily starting at age 45. ? Taking vitamin and mineral supplements as recommended by your health care provider. What happens during an annual well check? The services and screenings done by your health care provider during your annual well check will depend on your age, overall health, lifestyle  risk factors, and family history of disease. Counseling Your health care provider may ask you questions about your:  Alcohol use.  Tobacco use.  Drug use.  Emotional well-being.  Home and relationship well-being.  Sexual activity.  Eating habits.  Work and work Statistician.  Method of birth control.  Menstrual cycle.  Pregnancy history. Screening You may have the following tests or measurements:  Height, weight, and BMI.  Blood pressure.  Lipid and cholesterol levels. These may be checked every 5 years, or more frequently if you are over 66 years old.  Skin check.  Lung cancer screening. You may have this screening every year starting at age 32 if you have a 30-pack-year history of smoking and currently smoke or have quit within the past 15 years.  Colorectal cancer screening. All adults should have this screening starting at age 7 and continuing until age 6. Your health care provider may recommend screening at age 21. You will have tests every 1-10 years, depending on your results and the type of screening test. People at increased risk should start screening at an earlier age. Screening tests may include: ? Guaiac-based fecal occult blood testing. ? Fecal immunochemical test (FIT). ? Stool DNA test. ? Virtual colonoscopy. ? Sigmoidoscopy. During this test, a flexible tube with a tiny camera (sigmoidoscope) is used to examine your rectum and lower colon. The sigmoidoscope is inserted through your anus into your rectum and lower colon. ? Colonoscopy. During this test, a long, thin, flexible tube with a tiny camera (colonoscope) is used to examine your entire  colon and rectum.  Hepatitis C blood test.  Hepatitis B blood test.  Sexually transmitted disease (STD) testing.  Diabetes screening. This is done by checking your blood sugar (glucose) after you have not eaten for a while (fasting). You may have this done every 1-3 years.  Mammogram. This may be done every  1-2 years. Talk to your health care provider about when you should start having regular mammograms. This may depend on whether you have a family history of breast cancer.  BRCA-related cancer screening. This may be done if you have a family history of breast, ovarian, tubal, or peritoneal cancers.  Pelvic exam and Pap test. This may be done every 3 years starting at age 8. Starting at age 81, this may be done every 5 years if you have a Pap test in combination with an HPV test.  Bone density scan. This is done to screen for osteoporosis. You may have this scan if you are at high risk for osteoporosis. Discuss your test results, treatment options, and if necessary, the need for more tests with your health care provider. Vaccines Your health care provider may recommend certain vaccines, such as:  Influenza vaccine. This is recommended every year.  Tetanus, diphtheria, and acellular pertussis (Tdap, Td) vaccine. You may need a Td booster every 10 years.  Varicella vaccine. You may need this if you have not been vaccinated.  Zoster vaccine. You may need this after age 77.  Measles, mumps, and rubella (MMR) vaccine. You may need at least one dose of MMR if you were born in 1957 or later. You may also need a second dose.  Pneumococcal 13-valent conjugate (PCV13) vaccine. You may need this if you have certain conditions and were not previously vaccinated.  Pneumococcal polysaccharide (PPSV23) vaccine. You may need one or two doses if you smoke cigarettes or if you have certain conditions.  Meningococcal vaccine. You may need this if you have certain conditions.  Hepatitis A vaccine. You may need this if you have certain conditions or if you travel or work in places where you may be exposed to hepatitis A.  Hepatitis B vaccine. You may need this if you have certain conditions or if you travel or work in places where you may be exposed to hepatitis B.  Haemophilus influenzae type b (Hib)  vaccine. You may need this if you have certain conditions. Talk to your health care provider about which screenings and vaccines you need and how often you need them. This information is not intended to replace advice given to you by your health care provider. Make sure you discuss any questions you have with your health care provider. Document Released: 03/30/2015 Document Revised: 04/23/2017 Document Reviewed: 01/02/2015 Elsevier Interactive Patient Education  2019 [employer].

## 2018-09-01 NOTE — Assessment & Plan Note (Signed)
Borderline high on numerous visits, home readings better. Will have her start monitoring home readings more regularly and report readings consistently at or above 130/90. Consider increasing losartan to 100 mg if needed. Continue current regimen for now.

## 2018-09-01 NOTE — Assessment & Plan Note (Signed)
Appointment with GYN next week. Ultrasound with endometrial thickening that requires tissue sampling.

## 2018-09-01 NOTE — Assessment & Plan Note (Signed)
Immunizations UTD, we will get her set up with Shingrix vaccination once in stock. Pap smear due, seeing GYN next week. Mammogram overdue, orders placed. Colonoscopy due, referral to GI placed. Discussed the importance of a healthy diet and regular exercise in order for weight loss, and to reduce the risk of any potential medical problems. Exam stable. Labs reviewed. Follow up in 1 year for CPE

## 2018-09-06 ENCOUNTER — Encounter: Payer: Self-pay | Admitting: Gastroenterology

## 2018-09-07 ENCOUNTER — Other Ambulatory Visit: Payer: Self-pay | Admitting: Primary Care

## 2018-09-07 DIAGNOSIS — I10 Essential (primary) hypertension: Secondary | ICD-10-CM

## 2018-09-08 ENCOUNTER — Encounter: Payer: Federal, State, Local not specified - PPO | Admitting: Family Medicine

## 2018-09-13 ENCOUNTER — Encounter: Payer: Federal, State, Local not specified - PPO | Admitting: Obstetrics & Gynecology

## 2018-09-14 ENCOUNTER — Telehealth: Payer: Self-pay | Admitting: *Deleted

## 2018-09-14 ENCOUNTER — Ambulatory Visit (INDEPENDENT_AMBULATORY_CARE_PROVIDER_SITE_OTHER): Payer: Federal, State, Local not specified - PPO | Admitting: Family Medicine

## 2018-09-14 ENCOUNTER — Encounter: Payer: Self-pay | Admitting: Family Medicine

## 2018-09-14 VITALS — BP 103/66 | HR 84 | Temp 99.0°F | Wt 261.1 lb

## 2018-09-14 DIAGNOSIS — Z20822 Contact with and (suspected) exposure to covid-19: Secondary | ICD-10-CM

## 2018-09-14 DIAGNOSIS — J029 Acute pharyngitis, unspecified: Secondary | ICD-10-CM | POA: Diagnosis not present

## 2018-09-14 DIAGNOSIS — Z20828 Contact with and (suspected) exposure to other viral communicable diseases: Secondary | ICD-10-CM

## 2018-09-14 DIAGNOSIS — R6889 Other general symptoms and signs: Secondary | ICD-10-CM | POA: Diagnosis not present

## 2018-09-14 NOTE — Progress Notes (Signed)
I connected with Paige Hester on 09/14/18 at  3:20 PM EDT by video and verified that I am speaking with the correct person using two identifiers.   I discussed the limitations, risks, security and privacy concerns of performing an evaluation and management service by video and the availability of in person appointments. I also discussed with the patient that there may be a patient responsible charge related to this service. The patient expressed understanding and agreed to proceed.  Patient location: Home Provider Location: [facility] Participants: Lesleigh Noe and JALEXIA LALLI   Subjective:     Paige Hester is a 63 y.o. female presenting for Cough (symptoms started on 09/13/2018. temp highest 102.1. cough, sore throat)     Cough This is a new problem. The current episode started yesterday. The problem has been unchanged. The cough is productive of blood-tinged sputum. Associated symptoms include ear pain (while on the train), a fever, headaches, myalgias, nasal congestion and a sore throat. Pertinent negatives include no chills, postnasal drip, rhinorrhea, shortness of breath or wheezing. Nothing aggravates the symptoms. Risk factors for lung disease include travel. Treatments tried: tylenol. The treatment provided mild relief.   Recently traveled and had taken a train ride - was in [place] No known sick contact and train was socially distanced and was very cold   Review of Systems  Constitutional: Positive for fever. Negative for chills.  HENT: Positive for ear pain (while on the train) and sore throat. Negative for postnasal drip and rhinorrhea.   Respiratory: Positive for cough. Negative for shortness of breath and wheezing.   Musculoskeletal: Positive for myalgias.  Neurological: Positive for headaches.     Social History   Tobacco Use  Smoking Status Former Smoker  Smokeless Tobacco Never Used        Objective:   BP Readings from  Last 3 Encounters:  09/14/18 103/66  09/01/18 136/84  08/16/18 134/80   Wt Readings from Last 3 Encounters:  09/14/18 261 lb 2 oz (118.4 kg)  09/01/18 268 lb 8 oz (121.8 kg)  08/16/18 267 lb 8 oz (121.3 kg)   BP 103/66 Comment: per patient  Pulse 84 Comment: per patient  Temp 99 F (37.2 C) Comment: per patient  Wt 261 lb 2 oz (118.4 kg) Comment: per patient  BMI 43.45 kg/m    Physical Exam Constitutional:      Appearance: Normal appearance. She is not ill-appearing.  HENT:     Head: Normocephalic and atraumatic.     Right Ear: External ear normal.     Left Ear: External ear normal.  Eyes:     Conjunctiva/sclera: Conjunctivae normal.  Pulmonary:     Effort: Pulmonary effort is normal. No respiratory distress.  Neurological:     Mental Status: She is alert. Mental status is at baseline.  Psychiatric:        Mood and Affect: Mood normal.        Behavior: Behavior normal.        Thought Content: Thought content normal.        Judgment: Judgment normal.             Assessment & Plan:   Problem List Items Addressed This Visit    None    Visit Diagnoses    Suspected Covid-19 Virus Infection    -  Primary   Sore throat         Discussed that given age and presence of other  symptoms like muscle aches more likely viral and at this point high risk for COVID-19.   Recommended and referred for testing Advised staying home Sister will do grocery shopping Return/ER precautions discussed  Also advised calling back if sore throat is persisting and no other symptoms, could consider strept testing  Return if symptoms worsen or fail to improve.  Lesleigh Noe, MD

## 2018-09-14 NOTE — Telephone Encounter (Signed)
Pt scheduled for covid testing 09/15/18 @ [facility] @ 10:45. Instructions given and order placed

## 2018-09-14 NOTE — Telephone Encounter (Signed)
-----   Message from Lesleigh Noe, MD sent at 09/14/2018  3:40 PM EDT ----- Regarding: covid testing Recent travel from [place] by train.   Now with fever and sore throat

## 2018-09-15 ENCOUNTER — Telehealth: Payer: Self-pay | Admitting: Primary Care

## 2018-09-15 ENCOUNTER — Other Ambulatory Visit: Payer: Self-pay

## 2018-09-15 ENCOUNTER — Telehealth: Payer: Self-pay | Admitting: *Deleted

## 2018-09-15 ENCOUNTER — Ambulatory Visit: Payer: Federal, State, Local not specified - PPO | Admitting: *Deleted

## 2018-09-15 VITALS — Ht 65.0 in | Wt 261.0 lb

## 2018-09-15 DIAGNOSIS — R6889 Other general symptoms and signs: Secondary | ICD-10-CM | POA: Diagnosis not present

## 2018-09-15 DIAGNOSIS — Z20822 Contact with and (suspected) exposure to covid-19: Secondary | ICD-10-CM

## 2018-09-15 DIAGNOSIS — Z1211 Encounter for screening for malignant neoplasm of colon: Secondary | ICD-10-CM

## 2018-09-15 MED ORDER — SUPREP BOWEL PREP KIT 17.5-3.13-1.6 GM/177ML PO SOLN: 1.0000 | Freq: Once | ORAL | 0 refills | Status: AC

## 2018-09-15 NOTE — Telephone Encounter (Signed)
If she tests negative then it is very unlikely she has coronavirus. Let's see what the test shows and how she is feeling next week when she calls. If she still feels poorly or has continued fevers would reschedule regardless. Thanks

## 2018-09-15 NOTE — Telephone Encounter (Signed)
Dr Havery Moros,  I did a PV on this pt this afternoon - she has a colon scheduled for 7-10 FRI screening- last colon 2010 normal --   -Pt traveled to [facility] last Thursday 6-25 and returned Monday 6-29 via train - Monday she had a fever of 102- she states she did social distance on the train and wear a mask- she has no cough, although she did 1 x cough up some bloody mucus but since all mucus has been clear- no loss of taste or smell, no diarrhea- she has had some body aches like the flu-, she has a sore throat  she lives alone, she has self quarantined since her return Monday- pt had a  COVID test performed today - should have results 3-4 days per pt.  - I explained to her that we HAVE to have the results of her test prior to her colon FRI 7-10- definitely if + she will have to RS- but do you ant her to proceed even if the test is negative for Friday?  She will call me Monday with test results - I explained to her that she may need to go ahead and RS  Please advise  Thanks for your time, Lelan Pons

## 2018-09-15 NOTE — Progress Notes (Signed)
   Pt traveled to [facility] last Thursday 6-25 and returned Monday 6-29 via train - Monday she had a fever of 102- she states she did social distance on the train and wear a mask- she has no cough, although she did 1 x cough up some bloody mucus but since all mucus has been clear- no loss of taste or smell, no diarrhea- she has had some body aches like the flu-, she has a sore throat  she lives alone, she has self quarantined since her return Monday- colon is 7-10 Friday- TE to SA about ? RS - COVID test was performed today - should have results 3-4 days per pt.  - pt aware im sending note to SA and she may have to cancel colon even with a negative COVID test for now   No egg or soy allergy known to patient  No issues with past sedation with any surgeries  or procedures, no intubation problems  No diet pills per patient No home 02 use per patient  No blood thinners per patient  Pt denies issues with constipation  No A fib or A flutter  EMMI video sent to pt's e mail   Pt verified name, DOB, address and insurance during PV today. Pt mailed instruction packet to included paper to complete and mail back to [facility] with addressed and stamped envelope, Emmi video, copy of consent form to read and not return, and instructions. Suprep $15  coupon mailed in packet. PV completed over the phone. Pt encouraged to call with questions or issues   Pt is aware that care partner will wait in the car during proceudre; if they feel like they will be too hot to wait in the car; they may wait in the lobby.  We want them to wear a mask (we do not have any that we can provide them), practice social distancing, and we will check their temperatures when they get here.  I did remind patient that their care partner needs to stay in the parking lot the entire time. Pt will wear mask into building.

## 2018-09-15 NOTE — Telephone Encounter (Signed)
Please call pt to set up shingrix vaccine from wait list. Best dates are 7/14, 7/21, 7/28, 8/4, 8/11. Slots are 1130, 1145, 1200, 230, 245, 300, 315, 330. °

## 2018-09-15 NOTE — Telephone Encounter (Signed)
Called pt and gave her instructions per MD- will talk  Monday with results

## 2018-09-16 ENCOUNTER — Telehealth: Payer: Self-pay

## 2018-09-16 NOTE — Telephone Encounter (Signed)
Called patient to follow up on how she is doing since her virtual visit. Patient states that she is much much better. Sore throat still there and she is taking Ibuprofen for the pain. Cough is better-pretty much gone. COVID test result not back in yet. Patient said thank you for calling to check on her.

## 2018-09-21 ENCOUNTER — Encounter: Payer: Self-pay | Admitting: Family Medicine

## 2018-09-22 ENCOUNTER — Encounter: Payer: Self-pay | Admitting: *Deleted

## 2018-09-22 LAB — NOVEL CORONAVIRUS, NAA: SARS-CoV-2, NAA: NOT DETECTED

## 2018-09-22 NOTE — Telephone Encounter (Signed)
Yes if she tested negative and is feeling better okay to proceed. Thanks

## 2018-09-22 NOTE — Telephone Encounter (Signed)
Dr Havery Moros,  Paige Hester's COVID test is negative. She states she has some sore throat but feels so much better , no fever. Is She ok for her colon Friday 7-10???  Thanks, Lelan Pons

## 2018-09-22 NOTE — Telephone Encounter (Signed)
Patient called and wanted to advise that she has her covid-19 test results and it was negative. Patient wants a call just to confirmed that she is still on for her schedule colon Friday.Thanks

## 2018-09-23 ENCOUNTER — Telehealth: Payer: Self-pay | Admitting: Gastroenterology

## 2018-09-23 ENCOUNTER — Telehealth: Payer: Self-pay | Admitting: *Deleted

## 2018-09-23 NOTE — Telephone Encounter (Signed)
-----   Message from [facility] V sent at 09/21/2018 10:24 AM EDT ----- Good morning,  I wanted to follow up on this patient's COVID test. It has been 6 days as of today and I just wanted to check on this. We have not had this long for results to come back. Patient was calling and asking [place] about it too.  Thank you so much. Anastasiya.

## 2018-09-23 NOTE — Telephone Encounter (Signed)
Noted.  Pt is aware that we are proceeding as scheduled

## 2018-09-23 NOTE — Telephone Encounter (Signed)
Dr. Havery Moros,  This pt had a fever this past Monday of 102.  Her procedure is tomorrow.  Please advise.  Thanks, [employer]

## 2018-09-23 NOTE — Telephone Encounter (Signed)
-----   Message from [facility] V sent at 09/21/2018 10:24 AM EDT ----- Good morning,  I wanted to follow up on this patient's COVID test. It has been 6 days as of today and I just wanted to check on this. We have not had this long for results to come back. Patient was calling and asking [place] about it too.  Thank you so much. Anastasiya.

## 2018-09-23 NOTE — Telephone Encounter (Signed)
Thanks Cyril Mourning,  Please see yesterday's note when asked about this. She tested negative for COVID 19 and is feeling much better, okay to proceed if that is still the case. thanks.

## 2018-09-23 NOTE — Telephone Encounter (Signed)
Pt has received her results from the covid-19 test and had no questions.

## 2018-09-23 NOTE — Telephone Encounter (Signed)
FYI to Dr Cody 

## 2018-09-23 NOTE — Telephone Encounter (Signed)
Spoke with patient regarding Covid-19 screening questions Covid-19 Screening Questions:  Do you now or have you had a fever in the last 14 days?  Temp last Monday 102.1  Do you have any respiratory symptoms of shortness of breath or  cough now or in the last 14 days? no  Do you have any family members or close contacts with diagnosed or suspected Covid-19 in the past 14 days? no  Have you been tested for Covid-19 and found to be positive?  Yes, Negative  Pt made aware of that care partner may wait in the car or come up to the lobby during the procedure but will need to provide their own mask.

## 2018-09-24 ENCOUNTER — Encounter: Payer: Self-pay | Admitting: Gastroenterology

## 2018-09-24 ENCOUNTER — Ambulatory Visit (AMBULATORY_SURGERY_CENTER): Payer: Federal, State, Local not specified - PPO | Admitting: Gastroenterology

## 2018-09-24 ENCOUNTER — Other Ambulatory Visit: Payer: Self-pay

## 2018-09-24 VITALS — BP 131/75 | HR 58 | Temp 98.8°F | Resp 22 | Ht 65.0 in | Wt 261.0 lb

## 2018-09-24 DIAGNOSIS — Z1211 Encounter for screening for malignant neoplasm of colon: Secondary | ICD-10-CM | POA: Diagnosis not present

## 2018-09-24 DIAGNOSIS — D125 Benign neoplasm of sigmoid colon: Secondary | ICD-10-CM | POA: Diagnosis not present

## 2018-09-24 DIAGNOSIS — D123 Benign neoplasm of transverse colon: Secondary | ICD-10-CM | POA: Diagnosis not present

## 2018-09-24 MED ORDER — SODIUM CHLORIDE 0.9 % IV SOLN: 500.0000 mL | Freq: Once | INTRAVENOUS | Status: DC

## 2018-09-24 NOTE — Progress Notes (Signed)
A and O x3. Report to RN. Tolerated MAC anesthesia well.

## 2018-09-24 NOTE — Progress Notes (Signed)
Temperature taken by Nancy Campbell, LPN, VS taken by Courtney Washington, CMA 

## 2018-09-24 NOTE — Op Note (Signed)
[place] Patient Name: Paige Hester Procedure Date: 09/24/2018 3:46 PM MRN: [id number] Endoscopist: Remo Lipps P. Havery Moros , MD Age: 63 Referring MD:  Date of Birth: 1956-01-14 Gender: Female Account #: [id number] Procedure:                Colonoscopy Indications:              Screening for colorectal malignant neoplasm Medicines:                Monitored Anesthesia Care Procedure:                Pre-Anesthesia Assessment:                           - Prior to the procedure, a History and Physical                            was performed, and patient medications and                            allergies were reviewed. The patient's tolerance of                            previous anesthesia was also reviewed. The risks                            and benefits of the procedure and the sedation                            options and risks were discussed with the patient.                            All questions were answered, and informed consent                            was obtained. Prior Anticoagulants: The patient has                            taken no previous anticoagulant or antiplatelet                            agents. ASA Grade Assessment: II - A patient with                            mild systemic disease. After reviewing the risks                            and benefits, the patient was deemed in                            satisfactory condition to undergo the procedure.                           After obtaining informed consent, the colonoscope  was passed under direct vision. Throughout the                            procedure, the patient's blood pressure, pulse, and                            oxygen saturations were monitored continuously. The                            Colonoscope was introduced through the anus and                            advanced to the the cecum, identified by                            appendiceal  orifice and ileocecal valve. The                            colonoscopy was performed without difficulty. The                            patient tolerated the procedure well. The quality                            of the bowel preparation was good. The ileocecal                            valve, appendiceal orifice, and rectum were                            photographed. Scope In: 3:56:11 PM Scope Out: 4:12:35 PM Scope Withdrawal Time: 0 hours 14 minutes 24 seconds  Total Procedure Duration: 0 hours 16 minutes 24 seconds  Findings:                 The perianal and digital rectal examinations were                            normal.                           A diffuse area of mild melanosis was found in the                            entire colon.                           Three sessile polyps were found in the transverse                            colon. The polyps were 3 mm in size. These polyps                            were removed with a cold snare. Resection and  retrieval were complete.                           Three sessile polyps were found in the sigmoid                            colon. The polyps were 3 mm in size. These polyps                            were removed with a cold snare. Resection and                            retrieval were complete.                           Internal hemorrhoids were found during                            retroflexion. The hemorrhoids were small.                           The exam was otherwise without abnormality. Complications:            No immediate complications. Estimated blood loss:                            Minimal. Estimated Blood Loss:     Estimated blood loss was minimal. Impression:               - Melanosis in the colon.                           - Three 3 mm polyps in the transverse colon,                            removed with a cold snare. Resected and retrieved.                           -  Three 3 mm polyps in the sigmoid colon, removed                            with a cold snare. Resected and retrieved.                           - Internal hemorrhoids.                           - The examination was otherwise normal. Recommendation:           - Patient has a contact number available for                            emergencies. The signs and symptoms of potential                            delayed complications were discussed with the  patient. Return to normal activities tomorrow.                            Written discharge instructions were provided to the                            patient.                           - Resume previous diet.                           - Continue present medications.                           - Await pathology results. Remo Lipps P. Armbruster, MD 09/24/2018 4:16:48 PM This report has been signed electronically.

## 2018-09-24 NOTE — Progress Notes (Signed)
Called to room to assist during endoscopic procedure.  Patient ID and intended procedure confirmed with present staff. Received instructions for my participation in the procedure from the performing physician.  

## 2018-09-24 NOTE — Patient Instructions (Signed)
Please read handouts provided. Await pathology results. Continue present medications.      YOU HAD AN ENDOSCOPIC PROCEDURE TODAY AT THE [facility]:   Refer to the procedure report that was given to you for any specific questions about what was found during the examination.  If the procedure report does not answer your questions, please call your gastroenterologist to clarify.  If you requested that your care partner not be given the details of your procedure findings, then the procedure report has been included in a sealed envelope for you to review at your convenience later.  YOU SHOULD EXPECT: Some feelings of bloating in the abdomen. Passage of more gas than usual.  Walking can help get rid of the air that was put into your GI tract during the procedure and reduce the bloating. If you had a lower endoscopy (such as a colonoscopy or flexible sigmoidoscopy) you may notice spotting of blood in your stool or on the toilet paper. If you underwent a bowel prep for your procedure, you may not have a normal bowel movement for a few days.  Please Note:  You might notice some irritation and congestion in your nose or some drainage.  This is from the oxygen used during your procedure.  There is no need for concern and it should clear up in a day or so.  SYMPTOMS TO REPORT IMMEDIATELY:   Following lower endoscopy (colonoscopy or flexible sigmoidoscopy):  Excessive amounts of blood in the stool  Significant tenderness or worsening of abdominal pains  Swelling of the abdomen that is new, acute  Fever of 100F or higher    For urgent or emergent issues, a gastroenterologist can be reached at any hour by calling [phone].   DIET:  We do recommend a small meal at first, but then you may proceed to your regular diet.  Drink plenty of fluids but you should avoid alcoholic beverages for 24 hours.  ACTIVITY:  You should plan to take it easy for the rest of today and you should NOT  DRIVE or use heavy machinery until tomorrow (because of the sedation medicines used during the test).    FOLLOW UP: Our staff will call the number listed on your records 48-72 hours following your procedure to check on you and address any questions or concerns that you may have regarding the information given to you following your procedure. If we do not reach you, we will leave a message.  We will attempt to reach you two times.  During this call, we will ask if you have developed any symptoms of COVID 19. If you develop any symptoms (ie: fever, flu-like symptoms, shortness of breath, cough etc.) before then, please call [phone].  If you test positive for Covid 19 in the 2 weeks post procedure, please call and report this information to us.    If any biopsies were taken you will be contacted by phone or by letter within the next 1-3 weeks.  Please call us at [phone] if you have not heard about the biopsies in 3 weeks.    SIGNATURES/CONFIDENTIALITY: You and/or your care partner have signed paperwork which will be entered into your electronic medical record.  These signatures attest to the fact that that the information above on your After Visit Summary has been reviewed and is understood.  Full responsibility of the confidentiality of this discharge information lies with you and/or your care-partner. 

## 2018-09-28 ENCOUNTER — Telehealth: Payer: Self-pay | Admitting: *Deleted

## 2018-09-28 NOTE — Telephone Encounter (Signed)
  Follow up Call-  Call back number 09/24/2018  Post procedure Call Back phone  # [phone]  Permission to leave phone message Yes  Some recent data might be hidden     Patient questions:  Do you have a fever, pain , or abdominal swelling? No. Pain Score  0 *  Have you tolerated food without any problems? Yes.    Have you been able to return to your normal activities? Yes.    Do you have any questions about your discharge instructions: Diet   No. Medications  No. Follow up visit  No.  Do you have questions or concerns about your Care? No.  Actions: * If pain score is 4 or above: No action needed, pain <4.  1. Have you developed a fever since your procedure? no  2.   Have you had an respiratory symptoms (SOB or cough) since your procedure? no  3.   Have you tested positive for COVID 19 since your procedure no  4.   Have you had any family members/close contacts diagnosed with the COVID 19 since your procedure?  no   If yes to any of these questions please route to Joylene John, RN and Alphonsa Gin, Therapist, sports.

## 2018-10-02 ENCOUNTER — Other Ambulatory Visit: Payer: Self-pay | Admitting: Primary Care

## 2018-10-04 NOTE — Telephone Encounter (Signed)
lvm asking patient to call office °

## 2018-10-07 ENCOUNTER — Ambulatory Visit (INDEPENDENT_AMBULATORY_CARE_PROVIDER_SITE_OTHER): Payer: Federal, State, Local not specified - PPO | Admitting: Obstetrics & Gynecology

## 2018-10-07 ENCOUNTER — Encounter: Payer: Self-pay | Admitting: *Deleted

## 2018-10-07 ENCOUNTER — Encounter: Payer: Self-pay | Admitting: Obstetrics & Gynecology

## 2018-10-07 ENCOUNTER — Other Ambulatory Visit: Payer: Self-pay

## 2018-10-07 VITALS — BP 118/72 | HR 73 | Ht 65.0 in | Wt 262.0 lb

## 2018-10-07 DIAGNOSIS — N950 Postmenopausal bleeding: Secondary | ICD-10-CM

## 2018-10-07 DIAGNOSIS — Z124 Encounter for screening for malignant neoplasm of cervix: Secondary | ICD-10-CM | POA: Diagnosis not present

## 2018-10-07 DIAGNOSIS — Z1151 Encounter for screening for human papillomavirus (HPV): Secondary | ICD-10-CM | POA: Diagnosis not present

## 2018-10-07 DIAGNOSIS — Z01419 Encounter for gynecological examination (general) (routine) without abnormal findings: Secondary | ICD-10-CM | POA: Diagnosis not present

## 2018-10-07 DIAGNOSIS — Z113 Encounter for screening for infections with a predominantly sexual mode of transmission: Secondary | ICD-10-CM

## 2018-10-07 DIAGNOSIS — N870 Mild cervical dysplasia: Secondary | ICD-10-CM

## 2018-10-07 DIAGNOSIS — N841 Polyp of cervix uteri: Secondary | ICD-10-CM | POA: Diagnosis not present

## 2018-10-07 NOTE — Patient Instructions (Signed)
Return to clinic for any scheduled appointments or for any gynecologic concerns as needed.   

## 2018-10-07 NOTE — Progress Notes (Signed)
GYNECOLOGY ANNUAL PREVENTATIVE CARE ENCOUNTER NOTE  History:     Paige Hester is a 63 y.o. [phone] female here for a routine annual gynecologic exam and also referred by PCP for evaluation of postmenopausal bleeding.  Had three seperate episodes of bleeding, last one around 09/12/18 that lasted for 1-2 days. As per her PCP's notes: "She endorses a history of abnormal pap smears in her early 20's, none since. She does have her uterus and ovaries. She denies unexplained weight loss, night sweats, dysuria, urinary frequency, pelvic pain/pressure, vaginal discharge. She is not concerned about STDs. Her first cousin was recently diagnosed with 'cancer' to the pelvis. She is unsure of which type".  Denies any current abnormal vaginal bleeding, discharge, pelvic pain, problems with intercourse or other gynecologic concerns.    Gynecologic History No LMP recorded. Patient is postmenopausal. Last Pap: 10/10/2015. Results were: normal with negative HPV Last mammogram: 01/11/2013. Results were: normal; next one scheduled 10/11/2018  Obstetric History OB History  Gravida Para Term Preterm AB Living  3       2 1   SAB TAB Ectopic Multiple Live Births    2     1    # Outcome Date GA Lbr Len/2nd Weight Sex Delivery Anes PTL Lv  3 Gravida      Vag-Spont     2 TAB           1 TAB             Past Medical History:  Diagnosis Date   Allergy    Gallbladder polyp 2012   GERD (gastroesophageal reflux disease)    Hypertension    Iron deficiency anemia    Menopausal symptoms    since 2007   Murmur, cardiac    Vitamin D deficiency     Past Surgical History:  Procedure Laterality Date   COLONOSCOPY  2010    Current Outpatient Medications on File Prior to Visit  Medication Sig Dispense Refill   aspirin 81 MG tablet Take 81 mg by mouth daily.     Cholecalciferol (VITAMIN D3) 1000 UNITS tablet Take 1,000 Units by mouth daily.       fluticasone (FLONASE) 50 MCG/ACT nasal spray  Place 1 spray into both nostrils 2 (two) times daily as needed for allergies or rhinitis. 16 g 0   hydrochlorothiazide (MICROZIDE) 12.5 MG capsule TAKE 1 CAPSULE BY MOUTH ONCE DAILY FOR BLOOD PRESSURE 90 capsule 0   losartan (COZAAR) 50 MG tablet Take 1 tablet (50 mg total) by mouth daily. For blood pressure 90 tablet 0   Melatonin 10 MG TABS Take by mouth as needed.     omeprazole (PRILOSEC) 20 MG capsule Take 20 mg by mouth daily.     Potassium 99 MG TABS Take 1 tablet by mouth daily.     verapamil (VERELAN PM) 360 MG 24 hr capsule Take 1 capsule by mouth at bedtime 90 capsule 1   No current facility-administered medications on file prior to visit.     No Known Allergies  Social History:  reports that she has quit smoking. She has never used smokeless tobacco. She reports current alcohol use of about 7.0 standard drinks of alcohol per week. She reports that she does not use drugs.  Family History  Problem Relation Age of Onset   Heart disease Mother    Hypertension Mother    Heart attack Mother 32   Prostate cancer Father    Heart disease Father 32  CAD, STENT   Heart disease Brother        Myocardial infarction   Colon cancer Maternal Aunt    Coronary artery disease Sister    Colon polyps Neg Hx    Esophageal cancer Neg Hx    Rectal cancer Neg Hx    Stomach cancer Neg Hx     The following portions of the patient's history were reviewed and updated as appropriate: allergies, current medications, past family history, past medical history, past social history, past surgical history and problem list.  Review of Systems Pertinent items noted in HPI and remainder of comprehensive ROS otherwise negative.  Physical Exam:  BP 118/72    Pulse 73    Ht 5\' 5"  (1.651 m)    Wt 262 lb (118.8 kg)    BMI 43.60 kg/m  CONSTITUTIONAL: Well-developed, well-nourished female in no acute distress.  HENT:  Normocephalic, atraumatic, External right and left ear normal.  Oropharynx is clear and moist EYES: Conjunctivae and EOM are normal. Pupils are equal, round, and reactive to light. No scleral icterus.  NECK: Normal range of motion, supple, no masses.  Normal thyroid.  SKIN: Skin is warm and dry. No rash noted. Not diaphoretic. No erythema. No pallor. NEUROLOGIC: Alert and oriented to person, place, and time. Normal reflexes, muscle tone coordination. No cranial nerve deficit noted. PSYCHIATRIC: Normal mood and affect. Normal behavior. Normal judgment and thought content. CARDIOVASCULAR: Normal heart rate noted, regular rhythm RESPIRATORY: Clear to auscultation bilaterally. Effort and breath sounds normal, no problems with respiration noted. BREASTS: Symmetric in size. No masses, skin changes, nipple drainage, or lymphadenopathy. ABDOMEN: Soft, obese, normal bowel sounds, no distention appreciated.  No tenderness, rebound or guarding.  MUSCULOSKELETAL: Normal range of motion. No tenderness.  No cyanosis, clubbing, or edema.  2+ distal pulses. PELVIC: Normal appearing external genitalia; normal appearing vaginal mucosa with mild atrophy. Large 1.5 cm friable polypoid lesion noted at external cervical os.  Normal appearing discharge.  Pap smear obtained, there was some blood due to polypoid lesion bleeding.  Unable to palpate uterus or adnexa secondary to habitus.   CERVICAL POLYPECTOMY  AND ENDOMETRIAL BIOPSY NOTE Risks of the biopsy including pain, bleeding, infection, inadequate specimen, and need for additional procedures were discussed. The patient stated understanding and agreed to undergo procedure today. Consent was signed,  time out performed.  During the pelvic exam, the cervix was prepped with Betadine.  Ring forceps were used to grasp the polypoid lesion and the polypoid lesion was removed in fragments by twisting it off its base.  Tissue sent to pathology for analysis.  Unable to remove entire stalk.  A single-toothed tenaculum was placed on the anterior  lip of the cervix to stabilize it. The 3 mm pipelle was introduced into the endometrial cavity without difficulty to a depth of 7 cm, and a moderate amount of tissue was obtained and sent to pathology. Moderate bleeding was noted on the polyp stalk and hemostasis was achieved using silver nitrate sticks.  The patient tolerated the procedure well. The instruments were removed from the patient's vagina.  Imaging: US Pelvic Complete With Transvaginal Result Date: 08/26/2018 CLINICAL DATA:  Postmenopausal vaginal bleeding, 3 separate episodes of postmenopausal bleeding since early 2020 EXAM: TRANSABDOMINAL AND TRANSVAGINAL ULTRASOUND OF PELVIS TECHNIQUE: Both transabdominal and transvaginal ultrasound examinations of the pelvis were performed. Transabdominal technique was performed for global imaging of the pelvis including uterus, ovaries, adnexal regions, and pelvic cul-de-sac. It was necessary to proceed with endovaginal exam following  the transabdominal exam to visualize the endometrium and ovaries. COMPARISON:  None FINDINGS: Uterus Measurements: 7.0 x 3.3 x 4.1 cm = volume: 50 mL. Normal morphology without mass Endometrium Thickness: 8 mm.  Small amount of irregular endometrial fluid. Right ovary Not visualized on either transabdominal or endovaginal imaging, likely obscured by bowel Left ovary Not visualized on either transabdominal or endovaginal imaging, likely obscured by bowel Other findings No free pelvic fluid.  No adnexal masses. IMPRESSION: Small amount of endometrial fluid with abnormal thickening of the endometrium, 8 mm thick; in the setting of post-menopausal bleeding, endometrial sampling is indicated to exclude carcinoma. If results are benign, sonohysterogram should be considered for focal lesion work-up. (Ref: Radiological Reasoning: Algorithmic Workup of Abnormal Vaginal Bleeding with Endovaginal Sonography and Sonohysterography. AJR 2008; [id number]:[id number]) Electronically Signed   By: Lavonia Dana  M.D.   On: 08/26/2018 13:24     Assessment and Plan:     1. PMB (postmenopausal bleeding) 2. Polyp at cervical os 3. Well woman exam with routine gynecological exam Discussed etiologies of postmenopausal bleeding, concern about precancerous/hyperplasia or cancerous etiology (5 to 10% percent of cases). Also discussed role of unopposed estrogen exposure in leading to thickened or proliferative endometrium; and its possible correlation with endometrial hyperplasia/carcinoma.  However, she was reassured that endometrial atrophy and endometrial polyps are the most common causes of postmenopausal bleeding.  Uterine bleeding in postmenopausal women is usually light and self-limited. Exclusion of cancer is the main objective; therefore, treatment is usually unnecessary once cancer has been excluded.  The primary goal in the diagnostic evaluation of postmenopausal women with uterine bleeding is to exclude malignancy; she has undergo biopsy, polypectomy.   Further diagnostic evaluation is indicated for recurrent or persistent bleeding, or if inadequate tissue is obtained. Bleeding precautions reviewed. - Surgical pathology (cervical polyp fragments and endometrial biopsy) - Cytology - PAP Will follow up results and manage accordingly. Mammogram already scheduled Routine preventative health maintenance measures emphasized. Please refer to After Visit Summary for other counseling recommendations.      Verita Schneiders, MD, [place] [facility] [employer] [facility], [place]

## 2018-10-11 ENCOUNTER — Other Ambulatory Visit: Payer: Self-pay

## 2018-10-11 ENCOUNTER — Ambulatory Visit
Admission: RE | Admit: 2018-10-11 | Discharge: 2018-10-11 | Disposition: A | Discharge status: Home / Self Care | Payer: Federal, State, Local not specified - PPO | Source: Ambulatory Visit | Attending: Primary Care | Admitting: Primary Care

## 2018-10-11 DIAGNOSIS — Z1231 Encounter for screening mammogram for malignant neoplasm of breast: Secondary | ICD-10-CM | POA: Diagnosis not present

## 2018-10-11 DIAGNOSIS — Z1239 Encounter for other screening for malignant neoplasm of breast: Secondary | ICD-10-CM

## 2018-10-11 LAB — CYTOLOGY - PAP
Chlamydia: NEGATIVE
Diagnosis: NEGATIVE
HPV: NOT DETECTED
Neisseria Gonorrhea: NEGATIVE
Trichomonas: NEGATIVE

## 2018-10-12 ENCOUNTER — Ambulatory Visit (INDEPENDENT_AMBULATORY_CARE_PROVIDER_SITE_OTHER): Payer: Federal, State, Local not specified - PPO

## 2018-10-12 ENCOUNTER — Encounter: Payer: Self-pay | Admitting: Obstetrics & Gynecology

## 2018-10-12 DIAGNOSIS — Z23 Encounter for immunization: Secondary | ICD-10-CM | POA: Diagnosis not present

## 2018-10-12 DIAGNOSIS — N870 Mild cervical dysplasia: Secondary | ICD-10-CM | POA: Insufficient documentation

## 2018-10-13 ENCOUNTER — Telehealth: Payer: Self-pay | Admitting: Lactation Services

## 2018-10-13 ENCOUNTER — Telehealth: Payer: Self-pay

## 2018-10-13 DIAGNOSIS — N870 Mild cervical dysplasia: Secondary | ICD-10-CM

## 2018-10-13 NOTE — Telephone Encounter (Signed)
Called patient, left message to please call back

## 2018-10-13 NOTE — Telephone Encounter (Signed)
-----   Message from Osborne Oman, MD sent at 10/12/2018  3:32 PM EDT ----- Diagnosis 1. Endometrium, biopsy, uterus - ATROPHIC ENDOMETRIUM WITH BREAKDOWN. - NO HYPERPLASIA, ATYPIA OR MALIGNANCY IDENTIFIED. 2. Cervix, polyp, fragments - BENIGN UTERINE POLYP WITH FEATURES OF LOWER UTERINE SEGMENT AND ENDOCERVICAL ORIGIN. 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 - NO INVASIVE NEOPLASM IDENTIFIED.  Benign pathology from endometrium and polyp. 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 Please call to inform patient of results and recommendations.

## 2018-10-13 NOTE — Telephone Encounter (Signed)
Called pt to inform her of Pathology reports. Pt reports she saw the report in [place]. She is aware she has some milk cervical changes and will need a pap smear in 1 year to follow up. Pt reports she has no questions/concerns at this time.

## 2018-12-21 ENCOUNTER — Ambulatory Visit: Payer: Federal, State, Local not specified - PPO

## 2018-12-22 ENCOUNTER — Ambulatory Visit (INDEPENDENT_AMBULATORY_CARE_PROVIDER_SITE_OTHER): Payer: Federal, State, Local not specified - PPO

## 2018-12-22 DIAGNOSIS — Z23 Encounter for immunization: Secondary | ICD-10-CM | POA: Diagnosis not present

## 2018-12-22 NOTE — Progress Notes (Signed)
Per orders of Alma Friendly, injection of Flu and Shingrix 2nd shot given by Lurlean Nanny.  Patient tolerated injection well.

## 2018-12-27 ENCOUNTER — Other Ambulatory Visit: Payer: Self-pay | Admitting: Primary Care

## 2018-12-27 DIAGNOSIS — I10 Essential (primary) hypertension: Secondary | ICD-10-CM

## 2019-01-26 ENCOUNTER — Other Ambulatory Visit: Payer: Self-pay

## 2019-01-26 ENCOUNTER — Ambulatory Visit (INDEPENDENT_AMBULATORY_CARE_PROVIDER_SITE_OTHER)
Admission: RE | Admit: 2019-01-26 | Discharge: 2019-01-26 | Disposition: A | Discharge status: Home / Self Care | Payer: Federal, State, Local not specified - PPO | Source: Ambulatory Visit | Attending: Internal Medicine | Admitting: Internal Medicine

## 2019-01-26 ENCOUNTER — Ambulatory Visit (INDEPENDENT_AMBULATORY_CARE_PROVIDER_SITE_OTHER): Payer: Federal, State, Local not specified - PPO | Admitting: Internal Medicine

## 2019-01-26 ENCOUNTER — Encounter: Payer: Self-pay | Admitting: Internal Medicine

## 2019-01-26 VITALS — BP 134/76 | HR 67 | Temp 98.5°F | Wt 263.0 lb

## 2019-01-26 DIAGNOSIS — M7732 Calcaneal spur, left foot: Secondary | ICD-10-CM | POA: Diagnosis not present

## 2019-01-26 DIAGNOSIS — M79672 Pain in left foot: Secondary | ICD-10-CM | POA: Diagnosis not present

## 2019-01-26 MED ORDER — MELOXICAM 7.5 MG PO TABS: 7.5000 mg | ORAL_TABLET | Freq: Every day | ORAL | 0 refills | Status: DC | PRN

## 2019-01-26 NOTE — Progress Notes (Signed)
Subjective:    Patient ID: Paige Hester, female    DOB: [birth year], 63 y.o.   MRN: [id number]  HPI  Pt presents to the clinic today with c/o left heel pain. This has been ongoing for 2 days. She reports she woke up yesterday morning and when she took a step it was painful. She describes the pain as sharp. Turning foot makes it worse and makes the pain radiate into her left ankle. She rates her pain as 10/10 at is worst. She has tried stretching, ice, and OTC Tylenol with minimal relief. She denies any trauma or falls. She denies numbing, tingling or loss of strength.   Review of Systems      Past Medical History:  Diagnosis Date  . Allergy   . Gallbladder polyp 2012  . GERD (gastroesophageal reflux disease)   . Hypertension   . Iron deficiency anemia   . Menopausal symptoms    since 2007  . Murmur, cardiac   . Vitamin D deficiency     Current Outpatient Medications  Medication Sig Dispense Refill  . aspirin 81 MG tablet Take 81 mg by mouth daily.    . Cholecalciferol (VITAMIN D3) 1000 UNITS tablet Take 1,000 Units by mouth daily.      . fluticasone (FLONASE) 50 MCG/ACT nasal spray Place 1 spray into both nostrils 2 (two) times daily as needed for allergies or rhinitis. 16 g 0  . hydrochlorothiazide (MICROZIDE) 12.5 MG capsule TAKE 1 CAPSULE BY MOUTH ONCE DAILY FOR BLOOD PRESSURE 90 capsule 0  . losartan (COZAAR) 50 MG tablet Take 1 tablet by mouth once daily for blood pressure 90 tablet 0  . Melatonin 10 MG TABS Take by mouth as needed.    Marland Kitchen omeprazole (PRILOSEC) 20 MG capsule Take 20 mg by mouth daily.    . Potassium 99 MG TABS Take 1 tablet by mouth daily.    . verapamil (VERELAN PM) 360 MG 24 hr capsule Take 1 capsule by mouth at bedtime 90 capsule 1   No current facility-administered medications for this visit.     No Known Allergies  Family History  Problem Relation Age of Onset  . Heart disease Mother   . Hypertension Mother   . Heart attack Mother 38  .  Prostate cancer Father   . Heart disease Father 71       CAD, STENT  . Heart disease Brother        Myocardial infarction  . Colon cancer Maternal Aunt   . Coronary artery disease Sister   . Colon polyps Neg Hx   . Esophageal cancer Neg Hx   . Rectal cancer Neg Hx   . Stomach cancer Neg Hx     Social History   Socioeconomic History  . Marital status: Married    Spouse name: Not on file  . Number of children: Not on file  . Years of education: Not on file  . Highest education level: Not on file  Occupational History  . Not on file  Social Needs  . Financial resource strain: Not on file  . Food insecurity    Worry: Not on file    Inability: Not on file  . Transportation needs    Medical: Not on file    Non-medical: Not on file  Tobacco Use  . Smoking status: Former Research scientist (life sciences)  . Smokeless tobacco: Never Used  Substance and Sexual Activity  . Alcohol use: Yes    Alcohol/week: 7.0 standard drinks  Types: 7 Glasses of wine per week  . Drug use: No  . Sexual activity: Yes  Lifestyle  . Physical activity    Days per week: Not on file    Minutes per session: Not on file  . Stress: Not on file  Relationships  . Social Herbalist on phone: Not on file    Gets together: Not on file    Attends religious service: Not on file    Active member of club or organization: Not on file    Attends meetings of clubs or organizations: Not on file    Relationship status: Not on file  . Intimate partner violence    Fear of current or ex partner: Not on file    Emotionally abused: Not on file    Physically abused: Not on file    Forced sexual activity: Not on file  Other Topics Concern  . Not on file  Social History Narrative   Married.   1 child, 1 grandchild.   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   Enjoys going to [employer], singing.      Constitutional: Denies fever, malaise, fatigue, headache or abrupt weight changes.   Respiratory: Denies difficulty breathing, shortness of breath, cough or sputum production.   Cardiovascular: Denies chest pain, chest tightness, palpitations or swelling in the hands or feet.  Musculoskeletal: Pt reports left heel pain. Denies decrease in range of motion, difficulty with gait, muscle pain or joint swelling.  Skin: Denies redness, rashes, lesions or ulcercations.     No other specific complaints in a complete review of systems (except as listed in HPI above).  Objective:   Physical Exam   BP 134/76   Pulse 67   Temp 98.5 F (36.9 C) (Temporal)   Wt 263 lb (119.3 kg)   SpO2 98%   BMI 43.77 kg/m   Wt Readings from Last 3 Encounters:  10/07/18 262 lb (118.8 kg)  09/24/18 261 lb (118.4 kg)  09/15/18 261 lb (118.4 kg)    General: Appears her stated age, obese, in NAD. Skin: Warm, dry and intact. No rashes, lesions or ulcerations noted. Cardiovascular: Normal rate and rhythm. Loud Aortic murmur heard rubs or gallops noted. No JVD or BLE edema. Pulmonary/Chest: Normal effort and positive vesicular breath sounds. No respiratory distress. No wheezes, rales or ronchi noted.  Musculoskeletal: Normal flexion, extension and rotation of the left ankle. 5/5 BLE. No visible swelling noted to left heel/ankle. Limps with normal gait. Neurological: Sensation intact to bilateral lower extremities.  BMET    Component Value Date/Time   NA 140 08/27/2018 0937   K 3.8 08/27/2018 0937   CL 103 08/27/2018 0937   CO2 31 08/27/2018 0937   GLUCOSE 98 08/27/2018 0937   BUN 9 08/27/2018 0937   CREATININE 0.79 08/27/2018 0937   CALCIUM 9.6 08/27/2018 0937   GFRNONAA 93.10 01/03/2010 1305   GFRAA 97 08/20/2007 1308    Lipid Panel     Component Value Date/Time   CHOL 176 08/27/2018 0937   TRIG 61.0 08/27/2018 0937   HDL 63.70 08/27/2018 0937   CHOLHDL 3 08/27/2018 0937   VLDL 12.2 08/27/2018 0937   LDLCALC 100 (H) 08/27/2018 0937    CBC    Component Value Date/Time    WBC 6.4 11/05/2016 0835   RBC 4.42 11/05/2016 0835   HGB 11.8 (L) 11/05/2016 0835   HCT 36.6 11/05/2016 0835   PLT 347.0 11/05/2016 [id number]  MCV 82.9 11/05/2016 0835   MCHC 32.2 11/05/2016 0835   RDW 15.7 (H) 11/05/2016 0835   LYMPHSABS 2.5 11/05/2016 0835   MONOABS 0.6 11/05/2016 0835   EOSABS 0.4 11/05/2016 0835   BASOSABS 0.1 11/05/2016 0835    Hgb A1C Lab Results  Component Value Date   HGBA1C 6.2 08/27/2018           Assessment & Plan:  Left Heel Pain:  Xray of left foot today RX for Meloxicam 7.5 mg PO daily- avoid other NSAID's OTC Continue symptomatic treatment: ice, rest, elevation Continue with stretching exercises   Will follow up after xray, return precuations Webb Silversmith, NP

## 2019-01-26 NOTE — Patient Instructions (Signed)
Heel Spur  A heel spur is a bony growth that forms on the bottom of the heel bone (calcaneus). Heel spurs are common. They often cause inflammation in the band of tissue that connects the toes to the heel bone (plantar fascia). This may cause pain on the bottom of the foot, near the heel. Many people with plantar fasciitis also have heel spurs. However, spurs are not the cause of plantar fasciitis pain. What are the causes? The exact cause of heel spurs is not known. They may be caused by:  Pressure on the heel bone.  Bands of tissue (tendons) pulling on the heel bone. What increases the risk? You are more likely to develop this condition if you:  Are older than 40.  Are overweight.  Have wear-and-tear arthritis (osteoarthritis).  Have plantar fascia inflammation.  Participate in sports or activities that include a lot of running or jumping.  Wear poorly fitted shoes. What are the signs or symptoms? Some people have no symptoms. If you do have symptoms, they may include:  Pain in the bottom of your heel.  Pain that is worse when you first get out of bed.  Pain that gets worse after walking or standing. How is this diagnosed? This condition may be diagnosed based on:  Your symptoms and medical history.  A physical exam.  A foot X-ray. How is this treated? Treatment for this condition depends on how much pain you have. Treatment options may include:  Doing stretching exercises.  Losing weight, if necessary.  Wearing specific shoes or inserts inside of shoes (orthotics) for comfort and support.  Wearing splints on your feet while you sleep. Splints keep your feet in a position (usually 90 degrees) that should prevent and relieve the pain you feel when you first get out of bed. They also make stretching easier in the morning.  Taking over-the-counter medicine to relieve pain, such as NSAIDs.  Using high-intensity sound waves to break up the heel spur (extracorporeal  shock wave therapy).  Getting steroid injections in your heel to reduce inflammation.  Having surgery, if your heel spur causes long-term (chronic) pain. Follow these instructions at home:  Activity  Avoid activities that cause pain until you recover, or for as long as directed by your health care provider.  Do stretching exercises as directed. Stretch before exercising or being physically active. Managing pain, stiffness, and swelling  If directed, put ice on your foot: ? Put ice in a plastic bag. ? Place a towel between your skin and the bag. ? Leave the ice on for 20 minutes, 2-3 times a day.  Move your toes often to avoid stiffness and to lessen swelling.  When possible, raise (elevate) your foot above the level of your heart while you are sitting or lying down. General instructions  Take over-the-counter and prescription medicines only as told by your health care provider.  Wear supportive shoes that fit well. Wear splints, inserts, or orthotics as told by your health care provider.  If recommended, work with your health care provider to lose weight. This can relieve pressure on your foot.  Do not use any products that contain nicotine or tobacco, such as cigarettes and e-cigarettes. These can affect bone growth and healing. If you need help quitting, ask your health care provider.  Keep all follow-up visits as told by your health care provider. This is important. Contact a health care provider if:  Your pain does not go away with treatment.  Your pain gets  worse. Summary  A heel spur is a bony growth that forms on the bottom of the heel bone (calcaneus).  Heel spurs often cause inflammation in the band of tissue that connects the toes to the heel bone (plantar fascia). This may cause pain on the bottom of the foot, near the heel.  Doing stretching exercises, losing weight, wearing specific shoes or shoe inserts, wearing splints while you sleep, and taking pain  medicine may ease the pain and stiffness.  Other treatment options may include high-intensity sound waves to break up the heel spur, steroid injections, or surgery. This information is not intended to replace advice given to you by your health care provider. Make sure you discuss any questions you have with your health care provider. Document Released: 04/09/2005 Document Revised: 02/18/2017 Document Reviewed: 02/18/2017 Elsevier Patient Education  2020 [employer].

## 2019-01-27 NOTE — Telephone Encounter (Signed)
Patient was seen by Rollene Fare on 01/26/2019

## 2019-01-31 ENCOUNTER — Other Ambulatory Visit: Payer: Self-pay

## 2019-01-31 DIAGNOSIS — Z20822 Contact with and (suspected) exposure to covid-19: Secondary | ICD-10-CM

## 2019-02-02 LAB — NOVEL CORONAVIRUS, NAA: SARS-CoV-2, NAA: NOT DETECTED

## 2019-02-23 ENCOUNTER — Other Ambulatory Visit: Payer: Self-pay | Admitting: Primary Care

## 2019-02-23 DIAGNOSIS — R7303 Prediabetes: Secondary | ICD-10-CM

## 2019-03-03 ENCOUNTER — Other Ambulatory Visit: Payer: Self-pay

## 2019-03-03 ENCOUNTER — Other Ambulatory Visit (INDEPENDENT_AMBULATORY_CARE_PROVIDER_SITE_OTHER): Payer: Federal, State, Local not specified - PPO

## 2019-03-03 ENCOUNTER — Other Ambulatory Visit: Payer: Federal, State, Local not specified - PPO

## 2019-03-03 DIAGNOSIS — R7303 Prediabetes: Secondary | ICD-10-CM

## 2019-03-03 LAB — POCT GLYCOSYLATED HEMOGLOBIN (HGB A1C): Hemoglobin A1C: 5.9 % — AB (ref 4.0–5.6)

## 2019-04-07 ENCOUNTER — Other Ambulatory Visit: Payer: Self-pay

## 2019-04-07 ENCOUNTER — Emergency Department
Admission: EM | Admit: 2019-04-07 | Discharge: 2019-04-07 | Disposition: A | Discharge status: Home / Self Care | Payer: Federal, State, Local not specified - PPO | Attending: Emergency Medicine | Admitting: Emergency Medicine

## 2019-04-07 ENCOUNTER — Telehealth: Payer: Self-pay

## 2019-04-07 ENCOUNTER — Encounter: Payer: Self-pay | Admitting: Emergency Medicine

## 2019-04-07 DIAGNOSIS — K625 Hemorrhage of anus and rectum: Secondary | ICD-10-CM

## 2019-04-07 DIAGNOSIS — Z7982 Long term (current) use of aspirin: Secondary | ICD-10-CM | POA: Insufficient documentation

## 2019-04-07 DIAGNOSIS — Z1211 Encounter for screening for malignant neoplasm of colon: Secondary | ICD-10-CM

## 2019-04-07 DIAGNOSIS — Z87891 Personal history of nicotine dependence: Secondary | ICD-10-CM | POA: Diagnosis not present

## 2019-04-07 DIAGNOSIS — Z79899 Other long term (current) drug therapy: Secondary | ICD-10-CM | POA: Insufficient documentation

## 2019-04-07 DIAGNOSIS — I10 Essential (primary) hypertension: Secondary | ICD-10-CM | POA: Insufficient documentation

## 2019-04-07 LAB — COMPREHENSIVE METABOLIC PANEL WITH GFR
ALT: 14 U/L (ref 0–44)
AST: 17 U/L (ref 15–41)
Albumin: 3.6 g/dL (ref 3.5–5.0)
Alkaline Phosphatase: 79 U/L (ref 38–126)
Anion gap: 8 (ref 5–15)
BUN: 14 mg/dL (ref 8–23)
CO2: 28 mmol/L (ref 22–32)
Calcium: 9.4 mg/dL (ref 8.9–10.3)
Chloride: 105 mmol/L (ref 98–111)
Creatinine, Ser: 0.96 mg/dL (ref 0.44–1.00)
GFR calc Af Amer: >60 mL/min
GFR calc non Af Amer: >60 mL/min
Glucose, Bld: 102 mg/dL — ABNORMAL HIGH (ref 70–99)
Potassium: 3.6 mmol/L (ref 3.5–5.1)
Sodium: 141 mmol/L (ref 135–145)
Total Bilirubin: 0.5 mg/dL (ref 0.3–1.2)
Total Protein: 7.7 g/dL (ref 6.5–8.1)

## 2019-04-07 LAB — CBC
HCT: 38.1 % (ref 36.0–46.0)
Hemoglobin: 12.1 g/dL (ref 12.0–15.0)
MCH: 25.8 pg — ABNORMAL LOW (ref 26.0–34.0)
MCHC: 31.8 g/dL (ref 30.0–36.0)
MCV: 81.2 fL (ref 80.0–100.0)
Platelets: 388 10*3/uL (ref 150–400)
RBC: 4.69 MIL/uL (ref 3.87–5.11)
RDW: 14.8 % (ref 11.5–15.5)
WBC: 7 10*3/uL (ref 4.0–10.5)
nRBC: 0 % (ref 0.0–0.2)

## 2019-04-07 LAB — SAMPLE TO BLOOD BANK

## 2019-04-07 NOTE — ED Provider Notes (Signed)
[facility] Emergency Department Provider Note ____________________________________________   First MD Initiated Contact with Patient 04/07/19 1804     (approximate)  I have reviewed the triage vital signs and the nursing notes.  HISTORY  Chief Complaint Rectal Bleeding  HPI Paige Hester is a 64 y.o. female history of colonic polyps  Mom patient reports that Monday she had her normal bowel movement in the morning but she notes her a small amount of blood or clot with it.  None she is then continued to have about 2 bowel movements a day which is normal for her over the last several days and have had no blood in it, but again this morning when she had a bowel movement she again noticed the bowel movement looked pretty normal but had small amounts of blood in it once again  No pain no fevers no chills.  Has a history of colonic polyps and had a colonoscopy about a year ago to have those removed  Takes no blood thinners.  Takes baby aspirin daily otherwise.  No abdominal pain no fevers denies diarrhea, formed bowel movements 2-3 times a day which is her normal pattern.  No travel history or exposure to Covid   Past Medical History:  Diagnosis Date  . Allergy   . Gallbladder polyp 2012  . GERD (gastroesophageal reflux disease)   . Hypertension   . Iron deficiency anemia   . Menopausal symptoms    since 2007  . Murmur, cardiac   . Vitamin D deficiency     Patient Active Problem List   Diagnosis Date Noted  . Mild dysplasia of cervix (CIN I) 10/12/2018  . Vaginal bleeding 08/16/2018  . Prediabetes 11/04/2016  . BPPV (benign paroxysmal positional vertigo) 12/01/2014  . Preventative health care 08/30/2014  . Obesity, unspecified 08/10/2013  . GERD (gastroesophageal reflux disease) 05/31/2012  . Ulnar tunnel syndrome 07/02/2010  . MURMUR, CARDIAC, UNDIAGNOSED 09/20/2008  . ECZEMA 02/16/2007  . Essential hypertension 12/19/2006    Past Surgical  History:  Procedure Laterality Date  . COLONOSCOPY  2010   2020    Prior to Admission medications   Medication Sig Start Date End Date Taking? Authorizing Provider  aspirin 81 MG tablet Take 81 mg by mouth daily.    [provider]  Cholecalciferol (VITAMIN D3) 1000 UNITS tablet Take 1,000 Units by mouth daily.      [provider]  fluticasone (FLONASE) 50 MCG/ACT nasal spray Place 1 spray into both nostrils 2 (two) times daily as needed for allergies or rhinitis. 08/18/18   Pleas Koch, NP  hydrochlorothiazide (MICROZIDE) 12.5 MG capsule TAKE 1 CAPSULE BY MOUTH ONCE DAILY FOR BLOOD PRESSURE 12/28/18   Pleas Koch, NP  losartan (COZAAR) 50 MG tablet Take 1 tablet by mouth once daily for blood pressure 12/28/18   Pleas Koch, NP  Melatonin 10 MG TABS Take by mouth as needed.    [provider]  meloxicam (MOBIC) 7.5 MG tablet Take 1 tablet (7.5 mg total) by mouth daily as needed for pain. 01/26/19   Jearld Fenton, NP  omeprazole (PRILOSEC) 20 MG capsule Take 20 mg by mouth daily.    [provider]  Potassium 99 MG TABS Take 1 tablet by mouth daily.    [provider]  verapamil (VERELAN PM) 360 MG 24 hr capsule Take 1 capsule by mouth at bedtime 10/04/18   Pleas Koch, NP    Allergies Patient has no  known allergies.  Family History  Problem Relation Age of Onset  . Heart disease Mother   . Hypertension Mother   . Heart attack Mother 20  . Prostate cancer Father   . Heart disease Father 91       CAD, STENT  . Heart disease Brother        Myocardial infarction  . Colon cancer Maternal Aunt   . Coronary artery disease Sister   . Colon polyps Neg Hx   . Esophageal cancer Neg Hx   . Rectal cancer Neg Hx   . Stomach cancer Neg Hx     Social History Social History   Tobacco Use  . Smoking status: Former Research scientist (life sciences)  . Smokeless tobacco: Never Used  Substance Use Topics  . Alcohol use: Yes    Alcohol/week:  7.0 standard drinks    Types: 7 Glasses of wine per week  . Drug use: No    Review of Systems Constitutional: No fever/chills Cardiovascular: Denies chest pain. Respiratory: Denies shortness of breath. Gastrointestinal: No abdominal pain.  See HPI Genitourinary: Negative for dysuria.  Denies vaginal bleeding Musculoskeletal: Negative for back pain. Neurological: Negative for headaches, areas of focal weakness or numbness.    ____________________________________________   PHYSICAL EXAM:  VITAL SIGNS: ED Triage Vitals  Enc Vitals Group     BP 04/07/19 1425 (!) 169/67     Pulse Rate 04/07/19 1425 72     Resp 04/07/19 1425 18     Temp 04/07/19 1425 98.5 F (36.9 C)     Temp Source 04/07/19 1425 Oral     SpO2 04/07/19 1425 96 %     Weight 04/07/19 1424 260 lb (117.9 kg)     Height 04/07/19 1424 5\' 5"  (1.651 m)     Head Circumference --      Peak Flow --      Pain Score 04/07/19 1437 0     Pain Loc --      Pain Edu? --      Excl. in [place]? --     Constitutional: Alert and oriented. Well appearing and in no acute distress. Eyes: Conjunctivae are normal. Head: Atraumatic. Nose: No congestion/rhinnorhea. Neck: No stridor.  Cardiovascular: Normal rate, regular rhythm. Grossly normal heart sounds.  Good peripheral circulation. Respiratory: Normal respiratory effort.  No retractions. Lungs CTAB. Gastrointestinal: Soft and nontender. No distention.  External rectal inspection performed with tech Melanie.  No external hemorrhoids, no lesions noted, no blood noted. Musculoskeletal: No lower extremity tenderness nor edema. Neurologic:  Normal speech and language. No gross focal neurologic deficits are appreciated.  Skin:  Skin is warm, dry and intact. No rash noted. Psychiatric: Mood and affect are normal. Speech and behavior are normal.  ____________________________________________   LABS (all labs ordered are listed, but only abnormal results are displayed)  Labs Reviewed   COMPREHENSIVE METABOLIC PANEL - Abnormal; Notable for the following components:      Result Value   Glucose, Bld 102 (*)    All other components within normal limits  CBC - Abnormal; Notable for the following components:   MCH 25.8 (*)    All other components within normal limits  SAMPLE TO BLOOD BANK   ____________________________________________  EKG   ____________________________________________  RADIOLOGY   ____________________________________________   PROCEDURES  Procedure(s) performed: None  Procedures  Critical Care performed: No  ____________________________________________   INITIAL IMPRESSION / ASSESSMENT AND PLAN / ED COURSE  Pertinent labs & imaging results that were available during my  care of the patient were reviewed by me and considered in my medical decision making (see chart for details).   Patient wants evaluation of painless rectal bleeding.  Does have a history of colonic polyps about a year ago, and currently she is asymptomatic with normal hemoglobin and reassuring exam.  I suspect there is unlikely to be a severe or large amount of bleeding occurring, but the etiology is unclear.  I discussed with the patient and she has gastroenterologist, will be able to follow-up with them for further evaluation.  Careful return precautions discussed.  No indication for admission noted at this time.  Patient is not anticoagulated.  She is stable, symptoms started likely Monday recurred today.  No associated pain.  No fever.  Reassuring abdominal exam without pain or discomfort.  No evidence acute abdomen  Return precautions and treatment recommendations and follow-up discussed with the patient who is agreeable with the plan.       ____________________________________________   FINAL CLINICAL IMPRESSION(S) / ED DIAGNOSES  Final diagnoses:  Rectal bleeding        Note:  This document was prepared using Dragon voice recognition software and may  include unintentional dictation errors       Delman Kitten, MD 04/07/19 2012

## 2019-04-07 NOTE — Telephone Encounter (Signed)
I spoke with pt and she is on her way to [facility] ED now. FYI to Gentry Fitz NP who is out of office this afternoon. Will send to Avie Echevaria NP who is in office as well.

## 2019-04-07 NOTE — Telephone Encounter (Signed)
Paige Hester - Client TELEPHONE ADVICE RECORD [facility] Patient Name: Paige Hester Gender: Female DOB: 1955/04/03 Age: 64 Y 2 M 16 D Return Phone Number: [phone] (Primary) Address: City/State/Zip: [name] [address] Client [place] Hester - Client Client Site [place] - [facility] Physician Alma Friendly - NP Contact Type Call Who Is Calling Patient / Member / Family / Caregiver Call Type Triage / Clinical Relationship To Patient Self Return Phone Number [phone] (Primary) Chief Complaint Blood In Stool Reason for Call Symptomatic / Request for [place] states she has blood in her stool. [name] [place] ER Translation No Nurse Assessment Nurse: Paige Sa, RN, Paige Date/Time (Eastern Time): 04/07/2019 12:38:10 PM Confirm and document reason for call. If symptomatic, describe symptoms. ---Paige Hester states she developed blood in her stool about 3 days ago. No fever. No rectal pain or swelling. Alert and responsive. Has the patient had close contact with a person known or suspected to have the novel coronavirus illness OR traveled / lives in area with major community spread (including international travel) in the last 14 days from the onset of symptoms? * If Asymptomatic, screen for exposure and travel within the last 14 days. ---No Does the patient have any new or worsening symptoms? ---Yes Will a triage be completed? ---Yes Related visit to physician within the last 2 weeks? ---No Does the PT have any chronic conditions? (i.e. diabetes, asthma, this includes High risk factors for pregnancy, etc.) ---Yes List chronic conditions. ---High Blood Pressure, Polyps noted on last colonoscopy, Heart murmur Is this a behavioral health or substance abuse call? ---No Guidelines Guideline Title Affirmed Question Affirmed Notes Nurse Date/Time  (Eastern Time) Rectal Bleeding SEVERE rectal bleeding (large blood clots; on and off, or constant bleeding) [place], RN, Paige 04/07/2019 12:42:09 PM Disp. Time Paige Hester Time) Disposition Final User 04/07/2019 12:44:00 PM Go to ED Now Yes Paige Sa, RN, Paige Hester PLEASE NOTE: All timestamps contained within this report are represented as [place] Standard Time. CONFIDENTIALTY NOTICE: This fax transmission is intended only for the addressee. It contains information that is legally privileged, confidential or otherwise protected from use or disclosure. If you are not the intended recipient, you are strictly prohibited from reviewing, disclosing, copying using or disseminating any of this information or taking any action in reliance on or regarding this information. If you have received this fax in error, please notify us immediately by telephone so that we can arrange for its return to [place]. Phone: [phone], Toll-Free: [phone], Fax: [phone] Page: 2 of 2 Call Id: [id number] Caller Disagree/Comply Comply Caller Understands Yes PreDisposition Call Doctor Care Advice Given Per Guideline GO TO ED NOW: * You need to be seen in the Emergency Department. * Go to the ED at ___________ [place] now. Drive carefully. DRIVING: Another adult should drive. Do not delay going to the Emergency Department. If immediate transportation is not available via car or taxi, then the patient should be instructed to call [id number]. BRING MEDICINES: * Please bring a list of your current medicines when you go to the Emergency Department (ER). * It is also a good idea to bring the pill bottles too. This will help the doctor to make certain you are taking the right medicines and the right dose. CARE ADVICE given per Rectal Bleeding (Adult) guideline. Referrals GO TO FACILITY OTHER - SPECIFY

## 2019-04-07 NOTE — ED Notes (Signed)
See triage note   Presents stating that she noticed a dark stool on Monday  Then noticed bright red stool today  No active bleeding at present

## 2019-04-07 NOTE — Telephone Encounter (Signed)
If she is having severe rectal bleeding and is passing large clots then ED evaluation is appropriate.

## 2019-04-07 NOTE — ED Triage Notes (Signed)
Says Monday she had dark blood in stool that looked like clots.  Today bright red blood.

## 2019-04-08 NOTE — Telephone Encounter (Signed)
Please notify patient that I placed the referral to GI. This will now be my 3rd referral so please ensure she connects with their office. They should call.

## 2019-04-08 NOTE — Telephone Encounter (Signed)
Spoken and notified patient of Kate Clark's comments. Patient verbalized understanding.  

## 2019-04-08 NOTE — Addendum Note (Signed)
Addended by: Pleas Koch on: 04/08/2019 10:51 AM   Modules accepted: Orders

## 2019-04-08 NOTE — Telephone Encounter (Signed)
Patient stated that she was seen in the ED yesterday for the rectal bleeding.  They advised her to contact her PCP to get a referral to have another colonoscopy done  Patient stated she would like to have this done in [place]

## 2019-04-12 ENCOUNTER — Ambulatory Visit: Payer: Federal, State, Local not specified - PPO | Admitting: Nurse Practitioner

## 2019-04-12 ENCOUNTER — Other Ambulatory Visit (INDEPENDENT_AMBULATORY_CARE_PROVIDER_SITE_OTHER): Payer: Federal, State, Local not specified - PPO

## 2019-04-12 ENCOUNTER — Other Ambulatory Visit: Payer: Self-pay

## 2019-04-12 ENCOUNTER — Encounter: Payer: Self-pay | Admitting: Nurse Practitioner

## 2019-04-12 VITALS — BP 150/72 | HR 68 | Temp 97.6°F | Ht 65.0 in | Wt 271.6 lb

## 2019-04-12 DIAGNOSIS — K625 Hemorrhage of anus and rectum: Secondary | ICD-10-CM | POA: Diagnosis not present

## 2019-04-12 DIAGNOSIS — K648 Other hemorrhoids: Secondary | ICD-10-CM

## 2019-04-12 LAB — CBC
HCT: 36.6 % (ref 36.0–46.0)
Hemoglobin: 11.8 g/dL — ABNORMAL LOW (ref 12.0–15.0)
MCHC: 32.1 g/dL (ref 30.0–36.0)
MCV: 80.1 fl (ref 78.0–100.0)
Platelets: 347 10*3/uL (ref 150.0–400.0)
RBC: 4.57 Mil/uL (ref 3.87–5.11)
RDW: 15.9 % — ABNORMAL HIGH (ref 11.5–15.5)
WBC: 6 10*3/uL (ref 4.0–10.5)

## 2019-04-12 MED ORDER — HYDROCORTISONE (PERIANAL) 2.5 % EX CREA: 1.0000 "application " | TOPICAL_CREAM | Freq: Two times a day (BID) | CUTANEOUS | 1 refills | Status: DC

## 2019-04-12 NOTE — Patient Instructions (Signed)
If you are age 65 or older, your body mass index should be between 23-30. Your Body mass index is 45.2 kg/m. If this is out of the aforementioned range listed, please consider follow up with your Primary Care Provider.  If you are age 32 or younger, your body mass index should be between 19-25. Your Body mass index is 45.2 kg/m. If this is out of the aformentioned range listed, please consider follow up with your Primary Care Provider.   Your provider has requested that you go to the basement level for lab work before leaving today. Press "B" on the elevator. The lab is located at the first door on the left as you exit the elevator.  We have sent the following medications to your pharmacy for you to pick up at your convenience: Anusol cream  Start Metamucil in 8 ounces of water daily.  Call if persistent bleeding.  Thank you for choosing me and [facility].   Tye Savoy, NP  Due to recent changes in healthcare laws, you may see the results of your imaging and laboratory studies on MyChart before your provider has had a chance to review them.  We understand that in some cases there may be results that are confusing or concerning to you. Not all laboratory results come back in the same time frame and the provider may be waiting for multiple results in order to interpret others.  Please give [place] 48 hours in order for your provider to thoroughly review all the results before contacting the office for clarification of your results.

## 2019-04-12 NOTE — Progress Notes (Signed)
IMPRESSION and PLAN:    64 yo female with painless rectal bleeding, inflamed internal hemorrhoid on anoscopy today and internal hemorrhoids on colonoscopy July 2020. Suspect bleeding is hemorrhoidal.  - Anusol HC cream inside rectum BID x 10 days -Start daily Metamucil.  -She will call back if bleeding persists / recurs. May be candidate for banding if bleeding persists.      HPI:    Primary GI: Dr. Havery Moros   Chief complaint : follow up ED visit for rectal bleeding.   Paige Hester had an episode of painless rectal bleeding last Monday. Bright red blood was in the toilet, initially thought she was having a menstrual cycle. Bleeding was in the absence of constipation / straining or other bowel changes. Bowel movements on Tuesday, Wed were back to normal but then she had another episode of bleeding on Thursday. She went to ED, CBC was at baseline at 12.1. Bleeding suspected to be perianal. She is here for follow up. Paige Hester continued to have rectal bleeding with bowel movements over the weekend and yesterday, so far no bleeding today. She doesn't feel SOB or dizzy. Not anticoagulated.     Review of systems:     No chest pain, no SOB, no fevers, no urinary sx   Past Medical History:  Diagnosis Date  . Allergy   . Gallbladder polyp 2012  . GERD (gastroesophageal reflux disease)   . Hypertension   . Iron deficiency anemia   . Menopausal symptoms    since 2007  . Murmur, cardiac   . Vitamin D deficiency     Patient's surgical history, family medical history, social history, medications and allergies were all reviewed in Epic   Serum creatinine: 0.96 mg/dL 04/07/19 1430 Estimated creatinine clearance: 78 mL/min  Current Outpatient Medications  Medication Sig Dispense Refill  . aspirin 81 MG tablet Take 81 mg by mouth daily.    . Cholecalciferol (VITAMIN D3) 1000 UNITS tablet Take 1,000 Units by mouth daily.      . fluticasone (FLONASE) 50 MCG/ACT nasal spray Place 1  spray into both nostrils 2 (two) times daily as needed for allergies or rhinitis. 16 g 0  . hydrochlorothiazide (MICROZIDE) 12.5 MG capsule TAKE 1 CAPSULE BY MOUTH ONCE DAILY FOR BLOOD PRESSURE 90 capsule 0  . losartan (COZAAR) 50 MG tablet Take 1 tablet by mouth once daily for blood pressure 90 tablet 0  . Melatonin 10 MG TABS Take by mouth as needed.    Marland Kitchen omeprazole (PRILOSEC) 20 MG capsule Take 20 mg by mouth daily.    . Potassium 99 MG TABS Take 1 tablet by mouth daily.    . hydrocortisone (ANUSOL-HC) 2.5 % rectal cream Place 1 application rectally 2 (two) times daily. Use twice daily for 10 days 30 g 1  . verapamil (VERELAN PM) 360 MG 24 hr capsule Take 1 capsule by mouth at bedtime 90 capsule 1   No current facility-administered medications for this visit.    Physical Exam:     BP (!) 150/72   Pulse 68   Temp 97.6 F (36.4 C)   Ht 5\' 5"  (1.651 m)   Wt 271 lb 9.6 oz (123.2 kg)   BMI 45.20 kg/m   GENERAL:  Pleasant female in NAD PSYCH: : Cooperative, normal affect CARDIAC:  RRR, +murmur heard, no peripheral edema PULM: Normal respiratory effort, lungs CTA bilaterally, no wheezing ABDOMEN:  Nondistended, soft, nontender. No obvious masses, no hepatomegaly,  normal bowel sounds RECTAL: small amount of fleshy perianal hemorrhoid tissue. On anoscopy an inflamed internal hemorrhoid was seen.   SKIN:  turgor, no lesions seen Musculoskeletal:  Normal muscle tone, normal strength NEURO: Alert and oriented x 3, no focal neurologic deficits   Tye Savoy , NP 04/12/2019, 5:04 PM

## 2019-04-12 NOTE — Progress Notes (Signed)
cbc

## 2019-04-13 NOTE — Progress Notes (Signed)
Agree with assessment and plan as outlined. Given recent colonoscopy results, symptoms almost certainly related to hemorrhoids. If symptoms persist despite conservative measures she is a candidate for banding.

## 2019-04-19 ENCOUNTER — Other Ambulatory Visit: Payer: Self-pay | Admitting: Primary Care

## 2019-04-19 DIAGNOSIS — I10 Essential (primary) hypertension: Secondary | ICD-10-CM

## 2019-05-12 ENCOUNTER — Ambulatory Visit: Payer: Federal, State, Local not specified - PPO | Admitting: Gastroenterology

## 2019-05-24 ENCOUNTER — Other Ambulatory Visit: Payer: Self-pay | Admitting: Primary Care

## 2019-06-22 ENCOUNTER — Ambulatory Visit: Payer: Federal, State, Local not specified - PPO | Attending: Internal Medicine

## 2019-06-22 DIAGNOSIS — Z20822 Contact with and (suspected) exposure to covid-19: Secondary | ICD-10-CM

## 2019-06-23 LAB — SARS-COV-2, NAA 2 DAY TAT

## 2019-06-23 LAB — NOVEL CORONAVIRUS, NAA: SARS-CoV-2, NAA: NOT DETECTED

## 2019-06-29 ENCOUNTER — Telehealth: Payer: Self-pay | Admitting: *Deleted

## 2019-06-29 NOTE — Telephone Encounter (Signed)
Patient called stating that she has had her menstrual cycle now for about seven days. Patient stated today and last night she has had cramps with her cycle. Patient stated she has not had her menstrual cycle for years until now.  Patient stated that she had to see a OB?GYN a year or so ago and had to have a polyp removed from her cervix. Patient stated that she could not remember the name of the OB/GYN that she saw. Patient was given the name of the doctor and telephone number for the [facility] at [facility]. Advised patient that she should contact Dr. Harolyn Rutherford and let her know her symptoms. Advised patient if she has any problems getting in touch with the OB?GYN to call the office back. Patient stated that she will call the OB?FGYN to discuss her concerns.

## 2019-06-29 NOTE — Telephone Encounter (Signed)
Followed up with patient to make sure that she was able to reach the OB/GYN's office. Patient stated that she did call the OB/GYN's office. Patient stated that she was told that the nurse was busy and she will call her back before the end of the day. Advised patient to let [name] NP know if she can help her in any way. Patient appreciated the call.

## 2019-06-29 NOTE — Telephone Encounter (Signed)
Noted. She'll need vaginal ultrasound and evaluation.  We can surely help if she cannot get in touch with GYN.

## 2019-06-30 ENCOUNTER — Telehealth: Payer: Self-pay | Admitting: *Deleted

## 2019-06-30 ENCOUNTER — Other Ambulatory Visit: Payer: Self-pay | Admitting: *Deleted

## 2019-06-30 DIAGNOSIS — N950 Postmenopausal bleeding: Secondary | ICD-10-CM

## 2019-06-30 MED ORDER — MEGESTROL ACETATE 40 MG PO TABS: 40.0000 mg | ORAL_TABLET | Freq: Two times a day (BID) | ORAL | 2 refills | Status: DC

## 2019-06-30 NOTE — Telephone Encounter (Signed)
Called pt after speaking with Dr A, informed that we have sent in megace for the bleeding and we need to have her come in for an endometrial biopsy and repeat another pelvic ultrasound. Pt verbalizes and understands.

## 2019-06-30 NOTE — Telephone Encounter (Signed)
Called pt back, pt wanted to let us know she is bleeding, heavy like a period and having some clots. She has not had a bleeding episode since her last visit in July 2020. Will discuss with Dr A and call pt back.

## 2019-06-30 NOTE — Telephone Encounter (Signed)
-----   Message from Allena Earing, NT sent at 06/29/2019  3:15 PM EDT ----- Regarding: paitent requesting call Please call patient she has a question

## 2019-07-06 ENCOUNTER — Ambulatory Visit
Admission: RE | Admit: 2019-07-06 | Discharge: 2019-07-06 | Disposition: A | Discharge status: Home / Self Care | Payer: Federal, State, Local not specified - PPO | Source: Ambulatory Visit | Attending: Obstetrics & Gynecology | Admitting: Obstetrics & Gynecology

## 2019-07-06 ENCOUNTER — Other Ambulatory Visit: Payer: Self-pay

## 2019-07-06 DIAGNOSIS — N950 Postmenopausal bleeding: Secondary | ICD-10-CM | POA: Diagnosis not present

## 2019-07-06 DIAGNOSIS — N939 Abnormal uterine and vaginal bleeding, unspecified: Secondary | ICD-10-CM | POA: Diagnosis not present

## 2019-07-07 NOTE — Telephone Encounter (Signed)
OB/GYN Physician Phone Call Documentation  Patient was called today with 07/06/19 ultrasound results that showed abnormally thickened and heterogenous endometrial stripe for 18 mm.  Given that she already had a benign endometrial biopsy in 09/2018 that showed possible endometrial polyp; she has the option to proceed with Hysteroscopy, Dilation & Curettage this time. Hysteroscopy will allow visualization of endometrium to look for any reasons; D&C will be able to yield more tissue for pathology diagnosis.  Discussed this was done in OR under anesthesia. All questions answered.  She agrees to this, order sent for surgical scheduler to schedule this. She was told she will be contacted with details of this procedure. Information was given to her to review at home.  We will also discuss this further during her upcoming visit.     Verita Schneiders, MD, [place] for [facility], [place]

## 2019-07-21 ENCOUNTER — Other Ambulatory Visit: Payer: Self-pay

## 2019-07-21 ENCOUNTER — Encounter (HOSPITAL_BASED_OUTPATIENT_CLINIC_OR_DEPARTMENT_OTHER): Payer: Self-pay | Admitting: Obstetrics & Gynecology

## 2019-07-22 NOTE — Telephone Encounter (Signed)
Pt left v/m that on 07/27/19 pt is scheduled for D&C and Hysteroscopy and at preop interview pt was to find out if she can stop ASA 81 mg until after procedure.Please advise. Anda Kraft is out of office and sending note to Glenda Chroman FNP.

## 2019-07-22 NOTE — Telephone Encounter (Signed)
Called and spoke with patient who verbally understood she could hold on Asprin until after her procedure.

## 2019-07-22 NOTE — Telephone Encounter (Signed)
Please call patient tell her that it is okay for her to hold her aspirin until after her procedure.

## 2019-07-23 ENCOUNTER — Other Ambulatory Visit (HOSPITAL_COMMUNITY)
Admission: RE | Admit: 2019-07-23 | Discharge: 2019-07-23 | Disposition: A | Discharge status: Home / Self Care | Payer: Federal, State, Local not specified - PPO | Source: Ambulatory Visit | Attending: Obstetrics & Gynecology | Admitting: Obstetrics & Gynecology

## 2019-07-23 DIAGNOSIS — Z20822 Contact with and (suspected) exposure to covid-19: Secondary | ICD-10-CM | POA: Insufficient documentation

## 2019-07-23 DIAGNOSIS — Z01812 Encounter for preprocedural laboratory examination: Secondary | ICD-10-CM | POA: Diagnosis not present

## 2019-07-23 LAB — SARS CORONAVIRUS 2 (TAT 6-24 HRS): SARS Coronavirus 2: NEGATIVE

## 2019-07-25 ENCOUNTER — Encounter (HOSPITAL_BASED_OUTPATIENT_CLINIC_OR_DEPARTMENT_OTHER)
Admission: RE | Admit: 2019-07-25 | Discharge: 2019-07-25 | Disposition: A | Discharge status: Home / Self Care | Payer: Federal, State, Local not specified - PPO | Source: Ambulatory Visit | Attending: Obstetrics & Gynecology | Admitting: Obstetrics & Gynecology

## 2019-07-25 DIAGNOSIS — Z01818 Encounter for other preprocedural examination: Secondary | ICD-10-CM | POA: Insufficient documentation

## 2019-07-25 DIAGNOSIS — I440 Atrioventricular block, first degree: Secondary | ICD-10-CM | POA: Insufficient documentation

## 2019-07-25 LAB — BASIC METABOLIC PANEL
Anion gap: 10 (ref 5–15)
BUN: 10 mg/dL (ref 8–23)
CO2: 25 mmol/L (ref 22–32)
Calcium: 9.5 mg/dL (ref 8.9–10.3)
Chloride: 102 mmol/L (ref 98–111)
Creatinine, Ser: 0.81 mg/dL (ref 0.44–1.00)
GFR calc Af Amer: >60 mL/min (ref >60)
GFR calc non Af Amer: >60 mL/min (ref >60)
Glucose, Bld: 96 mg/dL (ref 70–99)
Potassium: 3.7 mmol/L (ref 3.5–5.1)
Sodium: 137 mmol/L (ref 135–145)

## 2019-07-25 LAB — CBC
HCT: 38.5 % (ref 36.0–46.0)
Hemoglobin: 12.2 g/dL (ref 12.0–15.0)
MCH: 26.3 pg (ref 26.0–34.0)
MCHC: 31.7 g/dL (ref 30.0–36.0)
MCV: 83.2 fL (ref 80.0–100.0)
Platelets: 390 10*3/uL (ref 150–400)
RBC: 4.63 MIL/uL (ref 3.87–5.11)
RDW: 14.6 % (ref 11.5–15.5)
WBC: 7.2 10*3/uL (ref 4.0–10.5)
nRBC: 0 % (ref 0.0–0.2)

## 2019-07-25 LAB — POCT PREGNANCY, URINE: Preg Test, Ur: NEGATIVE

## 2019-07-25 NOTE — Progress Notes (Signed)

## 2019-07-27 ENCOUNTER — Ambulatory Visit (HOSPITAL_BASED_OUTPATIENT_CLINIC_OR_DEPARTMENT_OTHER): Payer: Federal, State, Local not specified - PPO | Admitting: Anesthesiology

## 2019-07-27 ENCOUNTER — Other Ambulatory Visit: Payer: Self-pay

## 2019-07-27 ENCOUNTER — Encounter (HOSPITAL_BASED_OUTPATIENT_CLINIC_OR_DEPARTMENT_OTHER): Payer: Self-pay | Admitting: Obstetrics & Gynecology

## 2019-07-27 ENCOUNTER — Encounter (HOSPITAL_BASED_OUTPATIENT_CLINIC_OR_DEPARTMENT_OTHER)
Admission: RE | Disposition: A | Discharge status: Home / Self Care | Payer: Self-pay | Source: Home / Self Care | Attending: Obstetrics & Gynecology

## 2019-07-27 ENCOUNTER — Ambulatory Visit (HOSPITAL_BASED_OUTPATIENT_CLINIC_OR_DEPARTMENT_OTHER)
Admission: RE | Admit: 2019-07-27 | Discharge: 2019-07-27 | Disposition: A | Discharge status: Home / Self Care | Payer: Federal, State, Local not specified - PPO | Attending: Obstetrics & Gynecology | Admitting: Obstetrics & Gynecology

## 2019-07-27 DIAGNOSIS — Z7982 Long term (current) use of aspirin: Secondary | ICD-10-CM | POA: Diagnosis not present

## 2019-07-27 DIAGNOSIS — K219 Gastro-esophageal reflux disease without esophagitis: Secondary | ICD-10-CM | POA: Diagnosis not present

## 2019-07-27 DIAGNOSIS — R9389 Abnormal findings on diagnostic imaging of other specified body structures: Secondary | ICD-10-CM

## 2019-07-27 DIAGNOSIS — I10 Essential (primary) hypertension: Secondary | ICD-10-CM | POA: Insufficient documentation

## 2019-07-27 DIAGNOSIS — N950 Postmenopausal bleeding: Secondary | ICD-10-CM | POA: Diagnosis present

## 2019-07-27 DIAGNOSIS — E559 Vitamin D deficiency, unspecified: Secondary | ICD-10-CM | POA: Diagnosis not present

## 2019-07-27 DIAGNOSIS — N840 Polyp of corpus uteri: Secondary | ICD-10-CM

## 2019-07-27 DIAGNOSIS — N841 Polyp of cervix uteri: Secondary | ICD-10-CM | POA: Diagnosis not present

## 2019-07-27 DIAGNOSIS — Z79899 Other long term (current) drug therapy: Secondary | ICD-10-CM | POA: Insufficient documentation

## 2019-07-27 DIAGNOSIS — Z87891 Personal history of nicotine dependence: Secondary | ICD-10-CM | POA: Insufficient documentation

## 2019-07-27 HISTORY — PX: HYSTEROSCOPY WITH D & C: SHX1775

## 2019-07-27 SURGERY — DILATATION AND CURETTAGE /HYSTEROSCOPY
Anesthesia: General | Site: Vagina

## 2019-07-27 MED ORDER — BUPIVACAINE HCL 0.5 % IJ SOLN
INTRAMUSCULAR | Status: DC | PRN
  Administered 2019-07-27: 30 mL

## 2019-07-27 MED ORDER — SODIUM CHLORIDE 0.9 % IR SOLN
Status: DC | PRN
  Administered 2019-07-27: 3000 mL

## 2019-07-27 MED ORDER — LIDOCAINE HCL (CARDIAC) PF 100 MG/5ML IV SOSY
PREFILLED_SYRINGE | INTRAVENOUS | Status: DC | PRN
  Administered 2019-07-27: 60 mg via INTRAVENOUS

## 2019-07-27 MED ORDER — ONDANSETRON HCL 4 MG/2ML IJ SOLN
INTRAMUSCULAR | Status: AC
  Filled 2019-07-27: qty 2

## 2019-07-27 MED ORDER — ACETAMINOPHEN 500 MG PO TABS
1000.0000 mg | ORAL_TABLET | ORAL | Status: AC
  Administered 2019-07-27: 1000 mg via ORAL

## 2019-07-27 MED ORDER — ACETAMINOPHEN 500 MG PO TABS
ORAL_TABLET | ORAL | Status: AC
  Filled 2019-07-27: qty 2

## 2019-07-27 MED ORDER — DEXAMETHASONE SODIUM PHOSPHATE 4 MG/ML IJ SOLN
INTRAMUSCULAR | Status: DC | PRN
  Administered 2019-07-27: 10 mg via INTRAVENOUS

## 2019-07-27 MED ORDER — EPHEDRINE 5 MG/ML INJ
INTRAVENOUS | Status: AC
  Filled 2019-07-27: qty 10

## 2019-07-27 MED ORDER — GABAPENTIN 300 MG PO CAPS
300.0000 mg | ORAL_CAPSULE | ORAL | Status: AC
  Administered 2019-07-27: 300 mg via ORAL

## 2019-07-27 MED ORDER — FENTANYL CITRATE (PF) 100 MCG/2ML IJ SOLN
INTRAMUSCULAR | Status: AC
  Filled 2019-07-27: qty 2

## 2019-07-27 MED ORDER — SUCCINYLCHOLINE CHLORIDE 200 MG/10ML IV SOSY
PREFILLED_SYRINGE | INTRAVENOUS | Status: AC
  Filled 2019-07-27: qty 10

## 2019-07-27 MED ORDER — MIDAZOLAM HCL 2 MG/2ML IJ SOLN
INTRAMUSCULAR | Status: AC
  Filled 2019-07-27: qty 2

## 2019-07-27 MED ORDER — LACTATED RINGERS IV SOLN: INTRAVENOUS | Status: DC

## 2019-07-27 MED ORDER — LIDOCAINE 2% (20 MG/ML) 5 ML SYRINGE
INTRAMUSCULAR | Status: AC
  Filled 2019-07-27: qty 5

## 2019-07-27 MED ORDER — PROPOFOL 10 MG/ML IV BOLUS
INTRAVENOUS | Status: DC | PRN
  Administered 2019-07-27: 200 mg via INTRAVENOUS

## 2019-07-27 MED ORDER — PHENYLEPHRINE 40 MCG/ML (10ML) SYRINGE FOR IV PUSH (FOR BLOOD PRESSURE SUPPORT)
PREFILLED_SYRINGE | INTRAVENOUS | Status: AC
  Filled 2019-07-27: qty 10

## 2019-07-27 MED ORDER — ONDANSETRON HCL 4 MG/2ML IJ SOLN: 4.0000 mg | Freq: Once | INTRAMUSCULAR | Status: DC | PRN

## 2019-07-27 MED ORDER — OXYCODONE-ACETAMINOPHEN 5-325 MG PO TABS: 1.0000 | ORAL_TABLET | ORAL | 0 refills | Status: DC | PRN

## 2019-07-27 MED ORDER — DEXAMETHASONE SODIUM PHOSPHATE 10 MG/ML IJ SOLN
INTRAMUSCULAR | Status: AC
  Filled 2019-07-27: qty 1

## 2019-07-27 MED ORDER — GABAPENTIN 300 MG PO CAPS
ORAL_CAPSULE | ORAL | Status: AC
  Filled 2019-07-27: qty 1

## 2019-07-27 MED ORDER — ONDANSETRON HCL 4 MG/2ML IJ SOLN
INTRAMUSCULAR | Status: DC | PRN
  Administered 2019-07-27: 4 mg via INTRAVENOUS

## 2019-07-27 MED ORDER — OXYCODONE HCL 5 MG PO TABS: 5.0000 mg | ORAL_TABLET | Freq: Once | ORAL | Status: DC | PRN

## 2019-07-27 MED ORDER — DOCUSATE SODIUM 100 MG PO CAPS
100.0000 mg | ORAL_CAPSULE | Freq: Two times a day (BID) | ORAL | 2 refills | Status: DC | PRN
Start: 2019-07-27 — End: 2023-01-19

## 2019-07-27 MED ORDER — PROPOFOL 500 MG/50ML IV EMUL
INTRAVENOUS | Status: AC
  Filled 2019-07-27: qty 50

## 2019-07-27 MED ORDER — FENTANYL CITRATE (PF) 100 MCG/2ML IJ SOLN: 25.0000 ug | INTRAMUSCULAR | Status: DC | PRN

## 2019-07-27 MED ORDER — MIDAZOLAM HCL 5 MG/5ML IJ SOLN
INTRAMUSCULAR | Status: DC | PRN
  Administered 2019-07-27: 2 mg via INTRAVENOUS

## 2019-07-27 MED ORDER — KETOROLAC TROMETHAMINE 30 MG/ML IJ SOLN
INTRAMUSCULAR | Status: DC | PRN
  Administered 2019-07-27: 30 mg via INTRAVENOUS

## 2019-07-27 MED ORDER — FENTANYL CITRATE (PF) 100 MCG/2ML IJ SOLN
INTRAMUSCULAR | Status: DC | PRN
  Administered 2019-07-27 (×2): 50 ug via INTRAVENOUS

## 2019-07-27 MED ORDER — DIPHENHYDRAMINE HCL 50 MG/ML IJ SOLN
INTRAMUSCULAR | Status: DC | PRN
  Administered 2019-07-27: 6.25 mg via INTRAVENOUS

## 2019-07-27 MED ORDER — IBUPROFEN 600 MG PO TABS
600.0000 mg | ORAL_TABLET | Freq: Four times a day (QID) | ORAL | 2 refills | Status: DC | PRN
Start: 2019-07-27 — End: 2020-07-12

## 2019-07-27 MED ORDER — DIPHENHYDRAMINE HCL 50 MG/ML IJ SOLN
INTRAMUSCULAR | Status: AC
  Filled 2019-07-27: qty 1

## 2019-07-27 MED ORDER — OXYCODONE HCL 5 MG/5ML PO SOLN: 5.0000 mg | Freq: Once | ORAL | Status: DC | PRN

## 2019-07-27 SURGICAL SUPPLY — 14 items
BIPOLAR CUTTING LOOP 21FR (ELECTRODE)
CATH ROBINSON RED A/P 16FR (CATHETERS) ×2 IMPLANT
GAUZE 4X4 16PLY RFD (DISPOSABLE) ×2 IMPLANT
GLOVE BIOGEL PI IND STRL 7.0 (GLOVE) ×1 IMPLANT
GLOVE BIOGEL PI INDICATOR 7.0 (GLOVE) ×1
GLOVE ECLIPSE 7.0 STRL STRAW (GLOVE) ×2 IMPLANT
GOWN STRL REUS W/TWL LRG LVL3 (GOWN DISPOSABLE) ×4 IMPLANT
KIT PROCEDURE FLUENT (KITS) ×2 IMPLANT
LOOP CUTTING BIPOLAR 21FR (ELECTRODE) IMPLANT
PACK VAGINAL MINOR WOMEN LF (CUSTOM PROCEDURE TRAY) ×2 IMPLANT
PAD OB MATERNITY 4.3X12.25 (PERSONAL CARE ITEMS) ×2 IMPLANT
PAD PREP 24X48 CUFFED NSTRL (MISCELLANEOUS) ×2 IMPLANT
SLEEVE SCD COMPRESS KNEE MED (MISCELLANEOUS) ×2 IMPLANT
TOWEL GREEN STERILE FF (TOWEL DISPOSABLE) ×4 IMPLANT

## 2019-07-27 NOTE — Anesthesia Postprocedure Evaluation (Signed)
Anesthesia Post Note  Patient: TYLASIA COHAN  Procedure(s) Performed: DILATATION AND CURETTAGE /HYSTEROSCOPY, Polypectomy (N/A Vagina )     Patient location during evaluation: PACU Anesthesia Type: General Level of consciousness: awake, oriented and lethargic Pain management: pain level controlled Vital Signs Assessment: post-procedure vital signs reviewed and stable Respiratory status: spontaneous breathing, nonlabored ventilation, respiratory function stable and patient connected to nasal cannula oxygen Cardiovascular status: blood pressure returned to baseline and stable Postop Assessment: no apparent nausea or vomiting Anesthetic complications: no    Last Vitals:  Vitals:   07/27/19 1100 07/27/19 1140  BP: (!) 172/71 (!) 176/66  Pulse: 64 75  Resp: 17 18  Temp:  36.7 C  SpO2: 97% 99%    Last Pain:  Vitals:   07/27/19 1140  TempSrc:   PainSc: 0-No pain                 Courtlynn Holloman COKER

## 2019-07-27 NOTE — Discharge Instructions (Addendum)
Hysteroscopy, Care After This sheet gives you information about how to care for yourself after your procedure. Your health care provider may also give you more specific instructions. If you have problems or questions, contact your health care provider. What can I expect after the procedure? After the procedure, it is common to have:  Cramping.  Bleeding. This can vary from light spotting to menstrual-like bleeding. Follow these instructions at home: Activity  Rest for 1-2 days after the procedure.  Do not douche, use tampons, or have sex for 2 weeks after the procedure, or until your health care provider approves.  Do not drive for 24 hours after the procedure, or for as long as told by your health care provider.  Do not drive, use heavy machinery, or drink alcohol while taking prescription pain medicines. Medicines   Take over-the-counter and prescription medicines only as told by your health care provider.  Do not take aspirin during recovery. It can increase the risk of bleeding. General instructions  Do not take baths, swim, or use a hot tub until your health care provider approves. Take showers instead of baths for 2 weeks, or for as long as told by your health care provider.  To prevent or treat constipation while you are taking prescription pain medicine, your health care provider may recommend that you: ? Drink enough fluid to keep your urine clear or pale yellow. ? Take over-the-counter or prescription medicines. ? Eat foods that are high in fiber, such as fresh fruits and vegetables, whole grains, and beans. ? Limit foods that are high in fat and processed sugars, such as fried and sweet foods.  Keep all follow-up visits as told by your health care provider. This is important. Contact a health care provider if:  You feel dizzy or lightheaded.  You feel nauseous.  You have abnormal vaginal discharge.  You have a rash.  You have pain that does not get better with  medicine.  You have chills. Get help right away if:  You have bleeding that is heavier than a normal menstrual period.  You have a fever.  You have pain or cramps that get worse.  You develop new abdominal pain.  You faint.  You have pain in your shoulders.  You have shortness of breath. Summary  After the procedure, you may have cramping and some vaginal bleeding.  Do not douche, use tampons, or have sex for 2 weeks after the procedure, or until your health care provider approves.  Do not take baths, swim, or use a hot tub until your health care provider approves. Take showers instead of baths for 2 weeks, or for as long as told by your health care provider.  Report any unusual symptoms to your health care provider.  Keep all follow-up visits as told by your health care provider. This is important. This information is not intended to replace advice given to you by your health care provider. Make sure you discuss any questions you have with your health care provider. Document Revised: 02/13/2017 Document Reviewed: 04/01/2016 Elsevier Patient Education  [place].  ** No Tylenol until after 2:45 PM **No Ibuprofen until after 6 PM   Post Anesthesia Home Care Instructions  Activity: Get plenty of rest for the remainder of the day. A responsible individual must stay with you for 24 hours following the procedure.  For the next 24 hours, DO NOT: -Drive a car -Paediatric nurse -Drink alcoholic beverages -Take any medication unless instructed by your physician -  Make any legal decisions or sign important papers.  Meals: Start with liquid foods such as gelatin or soup. Progress to regular foods as tolerated. Avoid greasy, spicy, heavy foods. If nausea and/or vomiting occur, drink only clear liquids until the nausea and/or vomiting subsides. Call your physician if vomiting continues.  Special Instructions/Symptoms: Your throat may feel dry or sore from the anesthesia  or the breathing tube placed in your throat during surgery. If this causes discomfort, gargle with warm salt water. The discomfort should disappear within 24 hours.  If you had a scopolamine patch placed behind your ear for the management of post- operative nausea and/or vomiting:  1. The medication in the patch is effective for 72 hours, after which it should be removed.  Wrap patch in a tissue and discard in the trash. Wash hands thoroughly with soap and water. 2. You may remove the patch earlier than 72 hours if you experience unpleasant side effects which may include dry mouth, dizziness or visual disturbances. 3. Avoid touching the patch. Wash your hands with soap and water after contact with the patch.

## 2019-07-27 NOTE — Anesthesia Procedure Notes (Signed)
Procedure Name: LMA Insertion Date/Time: 07/27/2019 9:33 AM Performed by: Willa Frater, CRNA Pre-anesthesia Checklist: Patient identified, Emergency Drugs available, Suction available and Patient being monitored Patient Re-evaluated:Patient Re-evaluated prior to induction Oxygen Delivery Method: Circle system utilized Preoxygenation: Pre-oxygenation with 100% oxygen Induction Type: IV induction Ventilation: Mask ventilation without difficulty LMA: LMA inserted LMA Size: 4.0 Number of attempts: 1 Airway Equipment and Method: Bite block Placement Confirmation: positive ETCO2 Tube secured with: Tape Dental Injury: Teeth and Oropharynx as per pre-operative assessment

## 2019-07-27 NOTE — H&P (Signed)
Preoperative History and Physical  Paige Hester is a 64 y.o. [id number] here for surgical management of persistent postmenopausal bleeding.   No significant preoperative concerns.  Proposed surgery: Hysteroscopy, Dilation and Curettage  Past Medical History:  Diagnosis Date  . Allergy   . Gallbladder polyp 2012  . GERD (gastroesophageal reflux disease)   . Hypertension   . Iron deficiency anemia   . Menopausal symptoms    since 2007  . Murmur, cardiac   . Vitamin D deficiency    Past Surgical History:  Procedure Laterality Date  . COLONOSCOPY  2010   2020   OB History  Gravida Para Term Preterm AB Living  3       2 1   SAB TAB Ectopic Multiple Live Births    2     1    # Outcome Date GA Lbr Len/2nd Weight Sex Delivery Anes PTL Lv  3 Gravida      Vag-Spont     2 TAB           1 TAB           Patient denies any other pertinent gynecologic issues.   No current facility-administered medications on file prior to encounter.   Current Outpatient Medications on File Prior to Encounter  Medication Sig Dispense Refill  . aspirin 81 MG tablet Take 81 mg by mouth daily.    . Cholecalciferol (VITAMIN D3) 1000 UNITS tablet Take 1,000 Units by mouth daily.      . fluticasone (FLONASE) 50 MCG/ACT nasal spray Place 1 spray into both nostrils 2 (two) times daily as needed for allergies or rhinitis. 16 g 0  . hydrochlorothiazide (MICROZIDE) 12.5 MG capsule TAKE 1 CAPSULE BY MOUTH ONCE DAILY FOR BLOOD PRESSURE 90 capsule 0  . losartan (COZAAR) 50 MG tablet Take 1 tablet by mouth once daily for blood pressure 90 tablet 0  . megestrol (MEGACE) 40 MG tablet Take 1 tablet (40 mg total) by mouth 2 (two) times daily. Can increase to two tablets twice a day in the event of heavy bleeding 60 tablet 2  . Melatonin 10 MG TABS Take by mouth as needed.    Marland Kitchen omeprazole (PRILOSEC) 20 MG capsule Take 20 mg by mouth daily.    . Potassium 99 MG TABS Take 1 tablet by mouth daily.    . verapamil (VERELAN  PM) 360 MG 24 hr capsule Take 1 capsule by mouth at bedtime 90 capsule 0  . hydrocortisone (ANUSOL-HC) 2.5 % rectal cream Place 1 application rectally 2 (two) times daily. Use twice daily for 10 days 30 g 1   No Known Allergies  Social History:   reports that she quit smoking about 22 years ago. She has never used smokeless tobacco. She reports current alcohol use of about 7.0 standard drinks of alcohol per week. She reports that she does not use drugs.  Family History  Problem Relation Age of Onset  . Heart disease Mother   . Hypertension Mother   . Heart attack Mother 64  . Prostate cancer Father   . Heart disease Father 86       CAD, STENT  . Heart disease Brother        Myocardial infarction  . Colon cancer Maternal Aunt   . Coronary artery disease Sister   . Colon polyps Neg Hx   . Esophageal cancer Neg Hx   . Rectal cancer Neg Hx   . Stomach cancer Neg Hx  Review of Systems: Pertinent items noted in HPI and remainder of comprehensive ROS otherwise negative.  PHYSICAL EXAM: Blood pressure (!) 128/51, temperature 97.7 F (36.5 C), temperature source Oral, resp. rate 18, height 5\' 5"  (1.651 m), weight 122.7 kg, SpO2 100 %. CONSTITUTIONAL: Well-developed, well-nourished female in no acute distress.  HENT:  Normocephalic, atraumatic, External right and left ear normal. Oropharynx is clear and moist EYES: Conjunctivae and EOM are normal. Pupils are equal, round, and reactive to light. No scleral icterus.  NECK: Normal range of motion, supple, no masses SKIN: Skin is warm and dry. No rash noted. Not diaphoretic. No erythema. No pallor. NEUROLOGIC: Alert and oriented to person, place, and time. Normal reflexes, muscle tone coordination. No cranial nerve deficit noted. PSYCHIATRIC: Normal mood and affect. Normal behavior. Normal judgment and thought content. CARDIOVASCULAR: Normal heart rate noted, regular rhythm RESPIRATORY: Effort and breath sounds normal, no problems with  respiration noted ABDOMEN: Soft, nontender, nondistended. PELVIC: Deferred MUSCULOSKELETAL: Normal range of motion. No edema and no tenderness. 2+ distal pulses.  Labs: Results for orders placed or performed during the hospital encounter of 07/27/19 (from the past 336 hour(s))  CBC   Collection Time: 07/25/19  3:00 PM  Result Value Ref Range   WBC 7.2 4.0 - 10.5 K/uL   RBC 4.63 3.87 - 5.11 MIL/uL   Hemoglobin 12.2 12.0 - 15.0 g/dL   HCT 38.5 36.0 - 46.0 %   MCV 83.2 80.0 - 100.0 fL   MCH 26.3 26.0 - 34.0 pg   MCHC 31.7 30.0 - 36.0 g/dL   RDW 14.6 11.5 - 15.5 %   Platelets 390 150 - 400 K/uL   nRBC 0.0 0.0 - 0.2 %  Basic metabolic panel   Collection Time: 07/25/19  3:00 PM  Result Value Ref Range   Sodium 137 135 - 145 mmol/L   Potassium 3.7 3.5 - 5.1 mmol/L   Chloride 102 98 - 111 mmol/L   CO2 25 22 - 32 mmol/L   Glucose, Bld 96 70 - 99 mg/dL   BUN 10 8 - 23 mg/dL   Creatinine, Ser 0.81 0.44 - 1.00 mg/dL   Calcium 9.5 8.9 - 10.3 mg/dL   GFR calc non Af Amer >60 >60 mL/min   GFR calc Af Amer >60 >60 mL/min   Anion gap 10 5 - 15  Results for orders placed or performed during the hospital encounter of 07/25/19 (from the past 336 hour(s))  Pregnancy, urine POC   Collection Time: 07/25/19  2:41 PM  Result Value Ref Range   Preg Test, Ur NEGATIVE NEGATIVE  Results for orders placed or performed during the hospital encounter of 07/23/19 (from the past 336 hour(s))  SARS CORONAVIRUS 2 (TAT 6-24 HRS) Nasopharyngeal Nasopharyngeal Swab   Collection Time: 07/23/19 11:59 AM   Specimen: Nasopharyngeal Swab  Result Value Ref Range   SARS Coronavirus 2 NEGATIVE NEGATIVE    Imaging Studies: US PELVIC COMPLETE WITH TRANSVAGINAL  Result Date: 07/06/2019 CLINICAL DATA:  Initial evaluation for abnormal uterine bleeding. EXAM: TRANSABDOMINAL AND TRANSVAGINAL ULTRASOUND OF PELVIS TECHNIQUE: Both transabdominal and transvaginal ultrasound examinations of the pelvis were performed.  Transabdominal technique was performed for global imaging of the pelvis including uterus, ovaries, adnexal regions, and pelvic cul-de-sac. It was necessary to proceed with endovaginal exam following the transabdominal exam to visualize the uterus, endometrium, and ovaries. COMPARISON:  Prior ultrasound from 08/26/2018 FINDINGS: Uterus Measurements: 7.8 x 4.1 x 4.1 cm = volume: 67 mL. Uterus is anteverted. No fibroids  or other mass visualized. Endometrium Thickness: 17.6 mm. Endometrial stripe demonstrates a heterogeneous echotexture without focal lesion. No appreciable associated vascularity. Right ovary Not visualized.  No adnexal mass. Left ovary Not visualized.  No adnexal mass. Other findings No abnormal free fluid. IMPRESSION: 1. Endometrial stripe abnormally thickened up to 17.6 mm. In the setting of post-menopausal bleeding, endometrial sampling is indicated to exclude carcinoma. If results are benign, sonohysterogram should be considered for focal lesion work-up. (Ref: Radiological Reasoning: Algorithmic Workup of Abnormal Vaginal Bleeding with Endovaginal Sonography and Sonohysterography. AJR [id number]). 2. Otherwise normal sonographic appearance of the uterus. 3. Nonvisualization of either ovary.  No adnexal mass or free fluid. Electronically Signed   By: Jeannine Boga M.D.   On: 07/06/2019 19:14    Assessment: Principal Problem:   Postmenopausal bleeding Active Problems:   Endometrial thickness of 18 mm and associated bleeding in postmenopausal patient  Plan: Patient will undergo surgical management with Hysteroscopy, Dilation and Curettage.   The risks of surgery were discussed in detail with the patient including but not limited to: bleeding; infection which may require antibiotics; injury to surrounding organs which may involve uterus, bowel, bladder; need for additional procedures including laparoscopy or laparotomy; thromboembolic phenomenon, surgical site problems and other  postoperative/anesthesia complications. Likelihood of success in alleviating the patient's condition was discussed. Routine postoperative instructions will be reviewed with the patient and her family in detail after surgery.  The patient concurred with the proposed plan, giving informed written consent for the surgery.  Patient has been NPO since last night and she will remain NPO for procedure.  Anesthesia and OR aware.  Preoperative prophylactic antibiotics and SCDs ordered on call to the OR.  To OR when ready.    Verita Schneiders, MD, [place] for [employer] [facility], [place]

## 2019-07-27 NOTE — Transfer of Care (Signed)
Immediate Anesthesia Transfer of Care Note  Patient: Paige Hester  Procedure(s) Performed: DILATATION AND CURETTAGE /HYSTEROSCOPY, Polypectomy (N/A Vagina )  Patient Location: PACU  Anesthesia Type:General  Level of Consciousness: awake, alert , oriented and drowsy  Airway & Oxygen Therapy: Patient Spontanous Breathing and Patient connected to face mask oxygen  Post-op Assessment: Report given to RN and Post -op Vital signs reviewed and stable  Post vital signs: Reviewed and stable  Last Vitals:  Vitals Value Taken Time  BP    Temp    Pulse 71 07/27/19 1024  Resp 15 07/27/19 1024  SpO2 99 % 07/27/19 1024  Vitals shown include unvalidated device data.  Last Pain:  Vitals:   07/27/19 0840  TempSrc: Oral  PainSc: 0-No pain      Patients Stated Pain Goal: 3 ([id number])  Complications: No apparent anesthesia complications

## 2019-07-27 NOTE — Anesthesia Preprocedure Evaluation (Signed)
Anesthesia Evaluation  Patient identified by MRN, date of birth, ID band Patient awake    Reviewed: Allergy & Precautions, NPO status , Patient's Chart, lab work & pertinent test results  Airway Mallampati: II  TM Distance: >3 FB Neck ROM: Full    Dental  (+) Teeth Intact, Dental Advisory Given   Pulmonary former smoker,    breath sounds clear to auscultation       Cardiovascular hypertension,  Rhythm:Regular Rate:Normal     Neuro/Psych    GI/Hepatic   Endo/Other    Renal/GU      Musculoskeletal   Abdominal   Peds  Hematology   Anesthesia Other Findings   Reproductive/Obstetrics                             Anesthesia Physical Anesthesia Plan  ASA: III  Anesthesia Plan: General   Post-op Pain Management:    Induction: Intravenous  PONV Risk Score and Plan: Ondansetron and Dexamethasone  Airway Management Planned: LMA  Additional Equipment:   Intra-op Plan:   Post-operative Plan: Extubation in OR  Informed Consent: I have reviewed the patients History and Physical, chart, labs and discussed the procedure including the risks, benefits and alternatives for the proposed anesthesia with the patient or authorized representative who has indicated his/her understanding and acceptance.     Dental advisory given  Plan Discussed with: CRNA and Anesthesiologist  Anesthesia Plan Comments:         Anesthesia Quick Evaluation  

## 2019-07-27 NOTE — Op Note (Signed)
PREOPERATIVE DIAGNOSIS:  Postmenopausal bleeding; thickened endometrial stripe of 78mm POSTOPERATIVE DIAGNOSIS: The same; cervical and endometrial polypoid lesions PROCEDURE: Hysteroscopy, Polypectomy, Dilation and Curettage. SURGEON:  Dr. Verita Schneiders  INDICATIONS: 64 y.o. [id number]  here for scheduled surgery for the aforementioned diagnoses.   Risks of surgery were discussed with the patient including but not limited to: bleeding which may require transfusion; infection which may require antibiotics; injury to uterus or surrounding organs; need for additional procedures including laparotomy or laparoscopy; and other postoperative/anesthesia complications. Written informed consent was obtained.    FINDINGS:  A 8 week size uterus.  8 mm round polypoid lesion noted at cervical external os that was removed.  Large polypoid lesion arising from posterior endometrium, measured about 3 cm x 1.5 cm, that was removed.  Diffuse proliferative endometrium.  Normal ostia bilaterally.  ANESTHESIA:   General, paracervical block with 30 ml of 0.5% Marcaine FLUID DEFICITS:  115 ml of NS ESTIMATED BLOOD LOSS:  10 ml SPECIMENS: Cervical polyp fragments, Endometrial polyp fragments, Endometrial curettings all sent to pathology COMPLICATIONS:  None immediate.  PROCEDURE DETAILS:  The patient was then taken to the operating room where general anesthesia was administered and was found to be adequate.  After an adequate timeout was performed, she was placed in the dorsal lithotomy position and examined; then prepped and draped in the sterile manner.   Her bladder was catheterized for an unmeasured amount of clear, yellow urine. A speculum was then placed in the patient's vagina and a single tooth tenaculum was applied to the anterior lip of the cervix.   A paracervical block using 30 ml of 0.5% Marcaine was administered.  The round polypoid lesion noted in the ectocervix with narrow stalk. Ring forceps were used to grasp  the polypoid lesion and it was removed by twisting it off its base.  Tissue sent to pathology for analysis. The uterus was sounded to 8 cm and the cervix was dilated manually with metal dilators to accommodate the 5 mm diagnostic hysteroscope.  The hysteroscope was then inserted under direct visualization using NS as a suspension medium.  The uterine cavity was carefully examined with the findings as noted above.   After further careful visualization of the uterine cavity, the hysteroscope was removed under direct visualization.  The polyp forceps were used to remove the large endometrial polypoid lesion after a few attempts.  Diffuse sharp curettage was then performed to obtain a moderate amount of endometrial curettings. Patient had mild amount of bleeding after the procedure. The tenaculum was removed from the anterior lip of the cervix and the vaginal speculum was removed after noting good hemostasis.  The patient tolerated the procedure well and was taken to the recovery area awake, extubated and in stable condition.  The patient will be discharged to home as per PACU criteria.  Routine postoperative instructions given.  She was prescribed Percocet, Ibuprofen and Colace.  She will follow up in the office in about two weeks for postoperative evaluation.   Verita Schneiders, MD, [place] for [facility], [place]

## 2019-07-28 ENCOUNTER — Encounter: Payer: Self-pay | Admitting: *Deleted

## 2019-07-28 LAB — SURGICAL PATHOLOGY

## 2019-07-31 ENCOUNTER — Other Ambulatory Visit: Payer: Self-pay | Admitting: Primary Care

## 2019-07-31 DIAGNOSIS — I10 Essential (primary) hypertension: Secondary | ICD-10-CM

## 2019-08-02 ENCOUNTER — Encounter: Payer: Federal, State, Local not specified - PPO | Admitting: Obstetrics & Gynecology

## 2019-08-09 ENCOUNTER — Encounter: Payer: Self-pay | Admitting: Obstetrics & Gynecology

## 2019-08-09 ENCOUNTER — Ambulatory Visit (INDEPENDENT_AMBULATORY_CARE_PROVIDER_SITE_OTHER): Payer: Federal, State, Local not specified - PPO | Admitting: Obstetrics & Gynecology

## 2019-08-09 ENCOUNTER — Other Ambulatory Visit: Payer: Self-pay

## 2019-08-09 VITALS — BP 169/78 | HR 72 | Wt 269.0 lb

## 2019-08-09 DIAGNOSIS — N841 Polyp of cervix uteri: Secondary | ICD-10-CM | POA: Diagnosis not present

## 2019-08-09 DIAGNOSIS — N939 Abnormal uterine and vaginal bleeding, unspecified: Secondary | ICD-10-CM

## 2019-08-09 DIAGNOSIS — Z9889 Other specified postprocedural states: Secondary | ICD-10-CM

## 2019-08-09 DIAGNOSIS — N840 Polyp of corpus uteri: Secondary | ICD-10-CM | POA: Diagnosis not present

## 2019-08-09 DIAGNOSIS — Z09 Encounter for follow-up examination after completed treatment for conditions other than malignant neoplasm: Secondary | ICD-10-CM

## 2019-08-09 DIAGNOSIS — N950 Postmenopausal bleeding: Secondary | ICD-10-CM | POA: Diagnosis not present

## 2019-08-09 NOTE — Progress Notes (Signed)
     GYNECOLOGY POSTOPERATIVE VISIT  Subjective:     Paige Hester is a 64 y.o. PMP female who presents to the clinic 2 weeks status post Hysteroscopy, Polypectomy, Dilation and Curettage for postmenopausal bleeding. Procedure revealed endocervical and endometrial polyps. She is eating a regular diet without difficulty. Bowel movements are normal. Pain is controlled with current analgesics. Medications being used: prescription NSAID's including Ibuprofen.  The following portions of the patient's history were reviewed and updated as appropriate: allergies, current medications, past family history, past medical history, past social history, past surgical history and problem list. Normal pap and negative HPV in 10/07/2018. Normal mammogram on 10/11/2018.  Review of Systems Pertinent items noted in HPI and remainder of comprehensive ROS otherwise negative.    Objective:    BP (!) 169/78   Pulse 72   Wt 269 lb (122 kg)   BMI 44.76 kg/m  General:  alert and no distress  Abdomen: soft, non-tender  Pelvic:   deferred   07/27/2019 Surgical Pathology A. CERVIX, POLYPECTOMY: Endocervical-type polyp, benign.  B. ENDOMETRIUM, POLYPECTOMY: Endometrioid-type polyp, benign.  C. ENDOMETRIUM, CURETTAGE: Endometrioid-type polyp, benign.    Assessment:    Doing well postoperatively. Operative findings again reviewed. Pathology report discussed.    Plan:   Discussed procedure findings with patient. Will monitor bleeding for now.  If PMB continues, will proceed with vaginal hysterectomy. Discussed details about this with patient, information given to her to review. Continue Megace for now, bleeding precautions reviewed .     Verita Schneiders, MD, [place] for [facility], [place]

## 2019-08-09 NOTE — Patient Instructions (Signed)
Hopefully not, but if needed this is next step  Vaginal Hysterectomy  A vaginal hysterectomy is a procedure to remove all or part of the uterus through a small incision in the vagina. In this procedure, your health care provider may remove your entire uterus, including the lower end (cervix). You may need a vaginal hysterectomy to treat:  Uterine fibroids.  A condition that causes the lining of the uterus to grow in other areas (endometriosis).  Problems with pelvic support.  Cancer of the cervix, ovaries, uterus, or tissue that lines the uterus (endometrium).  Excessive (dysfunctional) uterine bleeding. When removing your uterus, your health care provider may also remove the organs that produce eggs (ovaries) and the tubes that carry eggs to your uterus (fallopian tubes). After a vaginal hysterectomy, you will no longer be able to have a baby. You will also no longer get your menstrual period. Tell a health care provider about:  Any allergies you have.  All medicines you are taking, including vitamins, herbs, eye drops, creams, and over-the-counter medicines.  Any problems you or family members have had with anesthetic medicines.  Any blood disorders you have.  Any surgeries you have had.  Any medical conditions you have.  Whether you are pregnant or may be pregnant. What are the risks? Generally, this is a safe procedure. However, problems may occur, including:  Bleeding.  Infection.  A blood clot that forms in your leg and travels to your lungs (pulmonary embolism).  Damage to surrounding organs.  Pain during sex. What happens before the procedure?  Ask your health care provider what organs will be removed during surgery.  Ask your health care provider about: ? Changing or stopping your regular medicines. This is especially important if you are taking diabetes medicines or blood thinners. ? Taking medicines such as aspirin and ibuprofen. These medicines can thin  your blood. Do not take these medicines before your procedure if your health care provider instructs you not to.  Follow instructions from your health care provider about eating or drinking restrictions.  Do not use any tobacco products, such as cigarettes, chewing tobacco, and e-cigarettes. If you need help quitting, ask your health care provider.  Plan to have someone take you home after discharge from the hospital. What happens during the procedure?  To reduce your risk of infection: ? Your health care team will wash or sanitize their hands. ? Your skin will be washed with soap.  An IV tube will be inserted into one of your veins.  You may be given antibiotic medicine to help prevent infection.  You will be given one or more of the following: ? A medicine to help you relax (sedative). ? A medicine to numb the area (local anesthetic). ? A medicine to make you fall asleep (general anesthetic). ? A medicine that is injected into an area of your body to numb everything beyond the injection site (regional anesthetic).  Your surgeon will make an incision in your vagina.  Your surgeon will locate and remove all or part of your uterus.  Your ovaries and fallopian tubes may be removed at the same time.  The incision will be closed with stitches (sutures) that dissolve over time. The procedure may vary among health care providers and hospitals. What happens after the procedure?  Your blood pressure, heart rate, breathing rate, and blood oxygen level will be monitored often until the medicines you were given have worn off.  You will be encouraged to get up and  walk around after a few hours to help prevent complications.  You may have IV tubes in place for a few days.  You will be given pain medicine as needed.  Do not drive for 24 hours if you were given a sedative. This information is not intended to replace advice given to you by your health care provider. Make sure you discuss  any questions you have with your health care provider. Document Revised: 10/25/2015 Document Reviewed: 03/18/2015 Elsevier Patient Education  2020 [employer].

## 2019-09-08 ENCOUNTER — Other Ambulatory Visit: Payer: Self-pay | Admitting: Primary Care

## 2019-09-08 ENCOUNTER — Telehealth: Payer: Self-pay | Admitting: Primary Care

## 2019-09-08 DIAGNOSIS — J302 Other seasonal allergic rhinitis: Secondary | ICD-10-CM

## 2019-09-08 MED ORDER — FLUTICASONE PROPIONATE 50 MCG/ACT NA SUSP: 1.0000 | Freq: Two times a day (BID) | NASAL | 0 refills | Status: AC | PRN

## 2019-09-08 NOTE — Telephone Encounter (Signed)
Patient's requesting a refill on Fluticasone.  Patient said she's leaving to go out of town tonight.  Patient's requesting rx be sent to [facility]

## 2019-09-08 NOTE — Telephone Encounter (Signed)
Ok to refill?  Last prescribed on 08/18/2018 Last OV (acute) with [name] on 01/27/2019 No future OV scheduled

## 2019-09-08 NOTE — Telephone Encounter (Signed)
Noted, prescription sent to pharmacy. 

## 2019-09-23 ENCOUNTER — Ambulatory Visit: Payer: Federal, State, Local not specified - PPO | Attending: Internal Medicine

## 2019-09-23 DIAGNOSIS — Z20822 Contact with and (suspected) exposure to covid-19: Secondary | ICD-10-CM

## 2019-09-25 LAB — SARS-COV-2, NAA 2 DAY TAT

## 2019-09-25 LAB — NOVEL CORONAVIRUS, NAA: SARS-CoV-2, NAA: NOT DETECTED

## 2019-09-26 ENCOUNTER — Telehealth: Payer: Self-pay

## 2019-09-26 NOTE — Telephone Encounter (Signed)
Pt tested for covid on 09/23/19 and results were sent to pt on mychart on 09/25/19. Pt said she has no symptoms but recently traveled on train from [place] with layover in [place]. Pt did wear a mask while traveling and social distanced. Pt has not had covid vaccines yet. Pt wants to know since covid test was not detected does pt need to self quarantine since traveling out of state. Pt was not exposed to known covid + person while traveling and still has no symptoms. Pt request cb. I advised pt if developed symptoms to call us back and at this point I do not think she will need to self quarantine but pt request note to go to Gentry Fitz NP to be sure of what Anda Kraft wants pt to do.

## 2019-09-26 NOTE — Telephone Encounter (Signed)
Pt notified as instructed per DPR and left detailed v/m of K Clark NP instruction and also left v/m that if further questions to call [place].

## 2019-09-26 NOTE — Telephone Encounter (Signed)
I agree, Mearl Latin. No need to quarantine, but to notify if symptoms develop. Thanks.

## 2019-11-10 ENCOUNTER — Other Ambulatory Visit: Payer: Self-pay | Admitting: Primary Care

## 2019-11-14 ENCOUNTER — Other Ambulatory Visit: Payer: Self-pay | Admitting: Primary Care

## 2019-11-14 DIAGNOSIS — I10 Essential (primary) hypertension: Secondary | ICD-10-CM

## 2019-11-18 ENCOUNTER — Encounter: Payer: Self-pay | Admitting: Primary Care

## 2019-11-18 ENCOUNTER — Other Ambulatory Visit: Payer: Self-pay

## 2019-11-18 ENCOUNTER — Ambulatory Visit: Payer: Federal, State, Local not specified - PPO | Admitting: Primary Care

## 2019-11-18 VITALS — BP 138/70 | HR 63 | Ht 64.0 in | Wt 264.0 lb

## 2019-11-18 DIAGNOSIS — R7303 Prediabetes: Secondary | ICD-10-CM

## 2019-11-18 DIAGNOSIS — N950 Postmenopausal bleeding: Secondary | ICD-10-CM

## 2019-11-18 DIAGNOSIS — I10 Essential (primary) hypertension: Secondary | ICD-10-CM

## 2019-11-18 DIAGNOSIS — Z114 Encounter for screening for human immunodeficiency virus [HIV]: Secondary | ICD-10-CM

## 2019-11-18 DIAGNOSIS — K219 Gastro-esophageal reflux disease without esophagitis: Secondary | ICD-10-CM | POA: Diagnosis not present

## 2019-11-18 DIAGNOSIS — Z1231 Encounter for screening mammogram for malignant neoplasm of breast: Secondary | ICD-10-CM

## 2019-11-18 DIAGNOSIS — Z0000 Encounter for general adult medical examination without abnormal findings: Secondary | ICD-10-CM | POA: Diagnosis not present

## 2019-11-18 DIAGNOSIS — R9389 Abnormal findings on diagnostic imaging of other specified body structures: Secondary | ICD-10-CM

## 2019-11-18 LAB — LIPID PANEL
Cholesterol: 158 mg/dL (ref 0–200)
HDL: 42.1 mg/dL (ref >39.00)
LDL Cholesterol: 105 mg/dL — ABNORMAL HIGH (ref 0–99)
NonHDL: 115.7
Total CHOL/HDL Ratio: 4
Triglycerides: 52 mg/dL (ref 0.0–149.0)
VLDL: 10.4 mg/dL (ref 0.0–40.0)

## 2019-11-18 LAB — COMPREHENSIVE METABOLIC PANEL
ALT: 10 U/L (ref 0–35)
AST: 14 U/L (ref 0–37)
Albumin: 4 g/dL (ref 3.5–5.2)
Alkaline Phosphatase: 59 U/L (ref 39–117)
BUN: 9 mg/dL (ref 6–23)
CO2: 27 mEq/L (ref 19–32)
Calcium: 9.9 mg/dL (ref 8.4–10.5)
Chloride: 104 mEq/L (ref 96–112)
Creatinine, Ser: 0.85 mg/dL (ref 0.40–1.20)
GFR: 81.26 mL/min (ref >60.00)
Glucose, Bld: 88 mg/dL (ref 70–99)
Potassium: 3.6 mEq/L (ref 3.5–5.1)
Sodium: 139 mEq/L (ref 135–145)
Total Bilirubin: 0.4 mg/dL (ref 0.2–1.2)
Total Protein: 7.1 g/dL (ref 6.0–8.3)

## 2019-11-18 LAB — HEMOGLOBIN A1C: Hgb A1c MFr Bld: 6 % (ref 4.6–6.5)

## 2019-11-18 NOTE — Assessment & Plan Note (Signed)
Immunizations UTD. Pap smear UTD. Mammogram due, orders placed. Colonoscopy UTD, due in 2027. Discussed the importance of a healthy diet and regular exercise in order for weight loss, and to reduce the risk of any potential medical problems.  Exam today stable. Labs pending.

## 2019-11-18 NOTE — Progress Notes (Signed)
Subjective:    Patient ID: Paige Hester, female    DOB: [birth year], 64 y.o.   MRN: [id number]  HPI  This visit occurred during the SARS-CoV-2 public health emergency.  Safety protocols were in place, including screening questions prior to the visit, additional usage of staff PPE, and extensive cleaning of exam room while observing appropriate contact time as indicated for disinfecting solutions.   [name] is a 64 year old female who presents today for complete physical.  Immunizations: -Tetanus: Completed in 2014 -Influenza: Due this season -Shingles: Completed series -Covid-19: Completed first dose yesterday   Diet: She endorses a fair diet.  Exercise: She is not exercising   Eye exam: Due, she will schedule  Dental exam: Completes annually   Pap Smear: Completed in 2020 Mammogram: Completed in 2020 Colonoscopy: Completed in 2020, due 2027 Hep C Screen: Negative   BP Readings from Last 3 Encounters:  11/18/19 138/70  08/09/19 (!) 169/78  07/27/19 (!) 176/66     Review of Systems  Constitutional: Negative for unexpected weight change.  HENT: Negative for rhinorrhea.   Respiratory: Negative for cough and shortness of breath.   Cardiovascular: Negative for chest pain.  Gastrointestinal: Negative for constipation and diarrhea.  Genitourinary: Negative for difficulty urinating.  Musculoskeletal: Negative for arthralgias and myalgias.  Skin: Negative for rash.  Allergic/Immunologic: Negative for environmental allergies.  Neurological: Negative for dizziness, numbness and headaches.  Psychiatric/Behavioral: The patient is nervous/anxious.        Past Medical History:  Diagnosis Date  . Allergy   . Gallbladder polyp 2012  . GERD (gastroesophageal reflux disease)   . Hypertension   . Iron deficiency anemia   . Menopausal symptoms    since 2007  . Murmur, cardiac   . Vitamin D deficiency      Social History   Socioeconomic History  . Marital  status: Married    Spouse name: Not on file  . Number of children: Not on file  . Years of education: Not on file  . Highest education level: Not on file  Occupational History  . Not on file  Tobacco Use  . Smoking status: Former Smoker    Quit date: 1999    Years since quitting: 22.6  . Smokeless tobacco: Never Used  Substance and Sexual Activity  . Alcohol use: Yes    Alcohol/week: 7.0 standard drinks    Types: 7 Glasses of wine per week  . Drug use: No  . Sexual activity: Yes  Other Topics Concern  . Not on file  Social History Narrative   Married.   1 child, 1 grandchild.   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   Enjoys going to [employer], singing.    Social Determinants of Health   Financial Resource Strain:   . Difficulty of Paying Living Expenses: Not on file  Food Insecurity:   . Worried About Charity fundraiser in the Last Year: Not on file  . Ran Out of Food in the Last Year: Not on file  Transportation Needs:   . Lack of Transportation (Medical): Not on file  . Lack of Transportation (Non-Medical): Not on file  Physical Activity:   . Days of Exercise per Week: Not on file  . Minutes of Exercise per Session: Not on file  Stress:   . Feeling of Stress : Not on file  Social Connections:   . Frequency of Communication with Friends and Family: Not  on file  . Frequency of Social Gatherings with Friends and Family: Not on file  . Attends Religious Services: Not on file  . Active Member of Clubs or Organizations: Not on file  . Attends Archivist Meetings: Not on file  . Marital Status: Not on file  Intimate Partner Violence:   . Fear of Current or Ex-Partner: Not on file  . Emotionally Abused: Not on file  . Physically Abused: Not on file  . Sexually Abused: Not on file    Past Surgical History:  Procedure Laterality Date  . COLONOSCOPY  2010   2020  . HYSTEROSCOPY WITH D & C N/A 07/27/2019   Procedure:  DILATATION AND CURETTAGE /HYSTEROSCOPY, Polypectomy;  Surgeon: Osborne Oman, MD;  Location: [facility];  Service: Gynecology;  Laterality: N/A;    Family History  Problem Relation Age of Onset  . Heart disease Mother   . Hypertension Mother   . Heart attack Mother 85  . Prostate cancer Father   . Heart disease Father 57       CAD, STENT  . Heart disease Brother        Myocardial infarction  . Colon cancer Maternal Aunt   . Coronary artery disease Sister   . Colon polyps Neg Hx   . Esophageal cancer Neg Hx   . Rectal cancer Neg Hx   . Stomach cancer Neg Hx     No Known Allergies  Current Outpatient Medications on File Prior to Visit  Medication Sig Dispense Refill  . aspirin 81 MG tablet Take 81 mg by mouth daily.    . Cholecalciferol (VITAMIN D3) 1000 UNITS tablet Take 1,000 Units by mouth daily.      Marland Kitchen docusate sodium (COLACE) 100 MG capsule Take 1 capsule (100 mg total) by mouth 2 (two) times daily as needed for mild constipation or moderate constipation. 30 capsule 2  . fluticasone (FLONASE) 50 MCG/ACT nasal spray Place 1 spray into both nostrils 2 (two) times daily as needed for allergies or rhinitis. 16 g 0  . hydrochlorothiazide (MICROZIDE) 12.5 MG capsule TAKE 1 CAPSULE BY MOUTH ONCE DAILY FOR BLOOD PRESSURE 30 capsule 0  . ibuprofen (ADVIL) 600 MG tablet Take 1 tablet (600 mg total) by mouth every 6 (six) hours as needed for headache, mild pain, moderate pain or cramping. 30 tablet 2  . losartan (COZAAR) 50 MG tablet Take 1 tablet (50 mg total) by mouth daily. For blood pressure NEED APPOINTMENT FOR ANY MORE REFILL 90 tablet 0  . megestrol (MEGACE) 40 MG tablet Take 1 tablet (40 mg total) by mouth 2 (two) times daily. Can increase to two tablets twice a day in the event of heavy bleeding 60 tablet 2  . Melatonin 10 MG TABS Take by mouth as needed.    Marland Kitchen omeprazole (PRILOSEC) 20 MG capsule Take 20 mg by mouth daily.    . Potassium 99 MG TABS Take 1 tablet  by mouth daily.    . verapamil (VERELAN PM) 360 MG 24 hr capsule Take 1 capsule by mouth at bedtime 90 capsule 0   No current facility-administered medications on file prior to visit.    BP 138/70   Pulse 63   Ht 5\' 4"  (1.626 m)   Wt 264 lb (119.7 kg)   SpO2 96%   BMI 45.32 kg/m    Objective:   Physical Exam HENT:     Right Ear: Tympanic membrane and ear canal normal.  Left Ear: Tympanic membrane and ear canal normal.  Eyes:     Pupils: Pupils are equal, round, and reactive to light.  Cardiovascular:     Rate and Rhythm: Normal rate and regular rhythm.  Pulmonary:     Effort: Pulmonary effort is normal.     Breath sounds: Normal breath sounds.  Abdominal:     General: Bowel sounds are normal.     Palpations: Abdomen is soft.     Tenderness: There is no abdominal tenderness.  Musculoskeletal:        General: Normal range of motion.     Cervical back: Neck supple.  Skin:    General: Skin is warm and dry.  Neurological:     Mental Status: She is alert and oriented to person, place, and time.     Cranial Nerves: No cranial nerve deficit.     Deep Tendon Reflexes:     Reflex Scores:      Patellar reflexes are 2+ on the right side and 2+ on the left side. Psychiatric:        Mood and Affect: Mood normal.            Assessment & Plan:

## 2019-11-18 NOTE — Assessment & Plan Note (Signed)
Infrequent symptoms, avoid triggers.

## 2019-11-18 NOTE — Assessment & Plan Note (Signed)
Following with GYN, on Megace intermittently. Pap smear reviewed from 2020.

## 2019-11-18 NOTE — Assessment & Plan Note (Signed)
Discussed the importance of a healthy diet and regular exercise in order for weight loss, and to reduce the risk of any potential medical problems.  Repeat A1C pending. 

## 2019-11-18 NOTE — Patient Instructions (Signed)
Stop by the lab prior to leaving today. I will notify you of your results once received.   Start exercising. You should be getting 150 minutes of moderate intensity exercise weekly.  It's important to improve your diet by reducing consumption of fast food, fried food, processed snack foods, sugary drinks. Increase consumption of fresh vegetables and fruits, whole grains, water.  Ensure you are drinking 64 ounces of water daily.  Monitor your blood pressure, notify me if you consistently see readings at or above 135/90.  It was a pleasure to see you today!   Preventive Care 50-75 Years Old, Female Preventive care refers to visits with your health care provider and lifestyle choices that can promote health and wellness. This includes:  A yearly physical exam. This may also be called an annual well check.  Regular dental visits and eye exams.  Immunizations.  Screening for certain conditions.  Healthy lifestyle choices, such as eating a healthy diet, getting regular exercise, not using drugs or products that contain nicotine and tobacco, and limiting alcohol use. What can I expect for my preventive care visit? Physical exam Your health care provider will check your:  Height and weight. This may be used to calculate body mass index (BMI), which tells if you are at a healthy weight.  Heart rate and blood pressure.  Skin for abnormal spots. Counseling Your health care provider may ask you questions about your:  Alcohol, tobacco, and drug use.  Emotional well-being.  Home and relationship well-being.  Sexual activity.  Eating habits.  Work and work Statistician.  Method of birth control.  Menstrual cycle.  Pregnancy history. What immunizations do I need?  Influenza (flu) vaccine  This is recommended every year. Tetanus, diphtheria, and pertussis (Tdap) vaccine  You may need a Td booster every 10 years. Varicella (chickenpox) vaccine  You may need this if you  have not been vaccinated. Zoster (shingles) vaccine  You may need this after age 31. Measles, mumps, and rubella (MMR) vaccine  You may need at least one dose of MMR if you were born in 1957 or later. You may also need a second dose. Pneumococcal conjugate (PCV13) vaccine  You may need this if you have certain conditions and were not previously vaccinated. Pneumococcal polysaccharide (PPSV23) vaccine  You may need one or two doses if you smoke cigarettes or if you have certain conditions. Meningococcal conjugate (MenACWY) vaccine  You may need this if you have certain conditions. Hepatitis A vaccine  You may need this if you have certain conditions or if you travel or work in places where you may be exposed to hepatitis A. Hepatitis B vaccine  You may need this if you have certain conditions or if you travel or work in places where you may be exposed to hepatitis B. Haemophilus influenzae type b (Hib) vaccine  You may need this if you have certain conditions. Human papillomavirus (HPV) vaccine  If recommended by your health care provider, you may need three doses over 6 months. You may receive vaccines as individual doses or as more than one vaccine together in one shot (combination vaccines). Talk with your health care provider about the risks and benefits of combination vaccines. What tests do I need? Blood tests  Lipid and cholesterol levels. These may be checked every 5 years, or more frequently if you are over 6 years old.  Hepatitis C test.  Hepatitis B test. Screening  Lung cancer screening. You may have this screening every year starting  at age 14 if you have a 30-pack-year history of smoking and currently smoke or have quit within the past 15 years.  Colorectal cancer screening. All adults should have this screening starting at age 41 and continuing until age 55. Your health care provider may recommend screening at age 27 if you are at increased risk. You will have  tests every 1-10 years, depending on your results and the type of screening test.  Diabetes screening. This is done by checking your blood sugar (glucose) after you have not eaten for a while (fasting). You may have this done every 1-3 years.  Mammogram. This may be done every 1-2 years. Talk with your health care provider about when you should start having regular mammograms. This may depend on whether you have a family history of breast cancer.  BRCA-related cancer screening. This may be done if you have a family history of breast, ovarian, tubal, or peritoneal cancers.  Pelvic exam and Pap test. This may be done every 3 years starting at age 41. Starting at age 107, this may be done every 5 years if you have a Pap test in combination with an HPV test. Other tests  Sexually transmitted disease (STD) testing.  Bone density scan. This is done to screen for osteoporosis. You may have this scan if you are at high risk for osteoporosis. Follow these instructions at home: Eating and drinking  Eat a diet that includes fresh fruits and vegetables, whole grains, lean protein, and low-fat dairy.  Take vitamin and mineral supplements as recommended by your health care provider.  Do not drink alcohol if: ? Your health care provider tells you not to drink. ? You are pregnant, may be pregnant, or are planning to become pregnant.  If you drink alcohol: ? Limit how much you have to 0-1 drink a day. ? Be aware of how much alcohol is in your drink. In the [place] one drink equals one 12 oz bottle of beer (355 mL), one 5 oz glass of wine (148 mL), or one 1 oz glass of hard liquor (44 mL). Lifestyle  Take daily care of your teeth and gums.  Stay active. Exercise for at least 30 minutes on 5 or more days each week.  Do not use any products that contain nicotine or tobacco, such as cigarettes, e-cigarettes, and chewing tobacco. If you need help quitting, ask your health care provider.  If you are  sexually active, practice safe sex. Use a condom or other form of birth control (contraception) in order to prevent pregnancy and STIs (sexually transmitted infections).  If told by your health care provider, take low-dose aspirin daily starting at age 27. What's next?  Visit your health care provider once a year for a well check visit.  Ask your health care provider how often you should have your eyes and teeth checked.  Stay up to date on all vaccines. This information is not intended to replace advice given to you by your health care provider. Make sure you discuss any questions you have with your health care provider. Document Revised: 11/12/2017 Document Reviewed: 11/12/2017 Elsevier Patient Education  2020 [employer].

## 2019-11-18 NOTE — Assessment & Plan Note (Signed)
Borderline high in the office today, discussed to monitor at home and report readings consistently elevated at 135/90.  CMP pending

## 2019-11-18 NOTE — Assessment & Plan Note (Signed)
Following with GYN, intermittently taking Megace. Pap smear UTD.

## 2019-11-22 LAB — HIV ANTIBODY (ROUTINE TESTING W REFLEX): HIV 1&2 Ab, 4th Generation: NONREACTIVE

## 2019-12-25 ENCOUNTER — Other Ambulatory Visit: Payer: Self-pay | Admitting: Primary Care

## 2019-12-26 ENCOUNTER — Telehealth: Payer: Self-pay | Admitting: Primary Care

## 2019-12-26 ENCOUNTER — Other Ambulatory Visit: Payer: Self-pay | Admitting: Primary Care

## 2019-12-26 DIAGNOSIS — I10 Essential (primary) hypertension: Secondary | ICD-10-CM

## 2019-12-26 MED ORDER — VERAPAMIL HCL ER 360 MG PO CP24: 360.0000 mg | ORAL_CAPSULE | Freq: Every day | ORAL | 3 refills | Status: DC

## 2019-12-26 MED ORDER — HYDROCHLOROTHIAZIDE 12.5 MG PO CAPS: 12.5000 mg | ORAL_CAPSULE | Freq: Every day | ORAL | 3 refills | Status: DC

## 2019-12-26 NOTE — Telephone Encounter (Signed)
Pt needs refill on HCTZ 12.5 mg and Verapamil PM 360mg  called in to [facility] on [address] in [place].  She is completely out.  Thank you!

## 2019-12-26 NOTE — Telephone Encounter (Signed)
Both refill sent to pharmacy as requested. Have her monitor her blood pressure and notify me if readings are at or above 135/90 consistently.

## 2019-12-27 NOTE — Telephone Encounter (Signed)
Called patient l/m to let her know meds called in. If any issues let our office know.

## 2020-01-19 ENCOUNTER — Other Ambulatory Visit: Payer: Self-pay

## 2020-01-19 ENCOUNTER — Telehealth (INDEPENDENT_AMBULATORY_CARE_PROVIDER_SITE_OTHER): Payer: Federal, State, Local not specified - PPO | Admitting: Family Medicine

## 2020-01-19 ENCOUNTER — Encounter: Payer: Self-pay | Admitting: Family Medicine

## 2020-01-19 VITALS — Temp 98.0°F | Ht 64.0 in | Wt 268.1 lb

## 2020-01-19 DIAGNOSIS — J029 Acute pharyngitis, unspecified: Secondary | ICD-10-CM

## 2020-01-19 MED ORDER — AMOXICILLIN 875 MG PO TABS: 875.0000 mg | ORAL_TABLET | Freq: Two times a day (BID) | ORAL | 0 refills | Status: AC

## 2020-01-19 NOTE — Progress Notes (Signed)
     Paige Burlison T. Mindel Friscia, MD Primary Care and Sports Medicine [facility] [place], [address] Phone: [phone]  FAX: [phone]  Paige Hester - 64 y.o. female  MRN [id number]  Date of Birth: [birth year]  Visit Date: 01/19/2020  PCP: Pleas Koch, NP  Referred by: Pleas Koch, NP Chief Complaint  Patient presents with  . Sore Throat   Virtual Visit via Video Note:  I connected with  EMBER HENRIKSON on 01/19/2020 10:40 AM EDT by a video enabled telemedicine application and verified that I am speaking with the correct person using two identifiers.   Location patient: home computer, tablet, or smartphone Location provider: work or home office Consent: Verbal consent directly obtained from Paige Hester. Persons participating in the virtual visit: patient, provider  I discussed the limitations of evaluation and management by telemedicine and the availability of in person appointments. The patient expressed understanding and agreed to proceed.  Interactive audio and video telecommunications were attempted between this provider and patient, however failed, due to patient having technical difficulties OR patient did not have access to video capability.  We continued and completed visit with audio only.   History of Present Illness:  Sore throat:  Sunday.  Painful.  No temp, l side of throat is somewhat red.   He noticed this after she was going home from church.  Since then her sore throat has progressed and it hurts quite a bit even to swallow.  She has noticed that she has some redness in the posterior oropharynx.  She has had strep off and on for years, usually about once a year.  Some ear pain.   Strep vs viral vs covid  Get covid test, rx with amox  Review of Systems as above: See pertinent positives and pertinent negatives per HPI No acute distress verbally   Observations/Objective/Exam:  An  attempt was made to discern vital signs over the phone and per patient if applicable and possible.   General:    Alert, Oriented, appears well and in no acute distress  Pulmonary:     On inspection no signs of respiratory distress.  Psych / Neurological:     Pleasant and cooperative.  Assessment and Plan:    ICD-10-CM   1. Sore throat  J02.9    Total encounter time: 10 minutes. This includes total time spent on the day of encounter.  Sore throat: Viral versus strep throat versus potentially COVID-19.  I did recommend that she get a COVID-19 test, and otherwise were treated with some amoxicillin.  I discussed the assessment and treatment plan with the patient. The patient was provided an opportunity to ask questions and all were answered. The patient agreed with the plan and demonstrated an understanding of the instructions.   The patient was advised to call back or seek an in-person evaluation if the symptoms worsen or if the condition fails to improve as anticipated.  Follow-up: prn unless noted otherwise below No follow-ups on file.  Meds ordered this encounter  Medications  . amoxicillin (AMOXIL) 875 MG tablet    Sig: Take 1 tablet (875 mg total) by mouth 2 (two) times daily for 10 days.    Dispense:  20 tablet    Refill:  0   No orders of the defined types were placed in this encounter.   Signed,  Maud Deed. Yukie Bergeron, MD

## 2020-01-30 ENCOUNTER — Ambulatory Visit
Admission: RE | Admit: 2020-01-30 | Discharge: 2020-01-30 | Disposition: A | Discharge status: Home / Self Care | Payer: Federal, State, Local not specified - PPO | Source: Ambulatory Visit | Attending: Primary Care | Admitting: Primary Care

## 2020-01-30 ENCOUNTER — Other Ambulatory Visit: Payer: Self-pay

## 2020-01-30 DIAGNOSIS — Z1231 Encounter for screening mammogram for malignant neoplasm of breast: Secondary | ICD-10-CM

## 2020-02-03 ENCOUNTER — Other Ambulatory Visit: Payer: Self-pay | Admitting: Obstetrics & Gynecology

## 2020-03-05 ENCOUNTER — Other Ambulatory Visit: Payer: Self-pay | Admitting: Primary Care

## 2020-03-24 ENCOUNTER — Other Ambulatory Visit: Payer: Self-pay

## 2020-03-24 ENCOUNTER — Other Ambulatory Visit: Payer: Federal, State, Local not specified - PPO

## 2020-03-24 DIAGNOSIS — Z20822 Contact with and (suspected) exposure to covid-19: Secondary | ICD-10-CM

## 2020-03-28 LAB — NOVEL CORONAVIRUS, NAA: SARS-CoV-2, NAA: NOT DETECTED

## 2020-03-29 ENCOUNTER — Telehealth: Payer: Self-pay

## 2020-03-29 NOTE — Telephone Encounter (Signed)
[facility] [facility] Day - Client TELEPHONE ADVICE RECORD [facility] Patient Name: Paige Hester Gender: Female DOB: 07-25-1955 Age: 65 Y 2 M 8 D Return Phone Number: [phone] (Primary) Address[place] [address] Client [facility] [place] [facility] - Client Client Site [place] - Day Physician Alma Friendly - NP Contact Type Call Who Is Calling Patient / Member / Family / Caregiver Call Type Triage / Clinical Relationship To Patient Self Return Phone Number [phone] (Primary) Chief Complaint BREATHING - fast, heavy or wheezing Reason for Call Symptomatic / Request for [place] states she is having pain on the right side, with difficulty breathing. Additional Comment Pt has a virtual appt 03/30/20 Translation No Nurse Assessment Nurse: Mancel Bale, RN, Butch Penny Date/Time Eilene Ghazi Time): 03/29/2020 8:48:40 AM Confirm and document reason for call. If symptomatic, describe symptoms. ---Caller states she started having chest pain starting on Tuesday night. She states the pain has worsened and is affecting her breathing. She was unable to lay flat to sleep laying flat. Does the patient have any new or worsening symptoms? ---Yes Will a triage be completed? ---Yes Related visit to physician within the last 2 weeks? ---Yes Does the PT have any chronic conditions? (i.e. diabetes, asthma, this includes High risk factors for pregnancy, etc.) ---Yes List chronic conditions. ---HTN Is this a behavioral health or substance abuse call? ---No Guidelines Guideline Title Affirmed Question Affirmed Notes Nurse Date/Time Eilene Ghazi Time) Chest Pain Sounds like a lifethreatening emergency to the triager Mancel Bale, RN, Butch Penny 03/29/2020 8:50:38 AM Disp. Time Eilene Ghazi Time) Disposition Final User 03/29/2020 8:45:32 AM Send to Urgent Lady Deutscher 03/29/2020 9:00:09 AM 911 Outcome  Documentation Mancel Bale, RN, Butch Penny Reason: EMS has been dispacthed 03/29/2020 8:54:31 AM Call EMS 911 Now Yes Mancel Bale, RN, Butch Penny PLEASE NOTE: All timestamps contained within this report are represented as [place] Standard Time. CONFIDENTIALTY NOTICE: This fax transmission is intended only for the addressee. It contains information that is legally privileged, confidential or otherwise protected from use or disclosure. If you are not the intended recipient, you are strictly prohibited from reviewing, disclosing, copying using or disseminating any of this information or taking any action in reliance on or regarding this information. If you have received this fax in error, please notify us immediately by telephone so that we can arrange for its return to [place]. Phone: [phone], Toll-Free: [phone], Fax: [phone] Page: 2 of 2 Call Id: [id number] Caller Disagree/Comply Comply Caller Understands Yes PreDisposition InappropriateToAsk Care Advice Given Per Guideline CALL EMS 911 NOW: * Immediate medical attention is needed. You need to hang up and call 911 (or an ambulance). * Triager Discretion: I'll call you back in a few minutes to be sure you were able to reach them. Comments User: Marijo Conception, RN Date/Time Eilene Ghazi Time): 03/29/2020 8:55:51 AM Caller states the pain stopped about 2 minutes ago, but when the episodes happen they last longer than 5 minutes and affect her breathing significantly. Referrals GO TO FACILITY UNDECIDED

## 2020-03-29 NOTE — Telephone Encounter (Signed)
She actually has an appointment with Dr. Silvio Pate for 03/30/20.

## 2020-03-29 NOTE — Telephone Encounter (Signed)
I spoke with pt;pt having dull chest pain off and on when coughing.pt said she feels like she has bronchitis. Pt will go to [facility] in [place] for eval and possible CXR. Sending note to Gentry Fitz NP.

## 2020-03-30 ENCOUNTER — Other Ambulatory Visit: Payer: Self-pay

## 2020-03-30 ENCOUNTER — Telehealth: Payer: Federal, State, Local not specified - PPO | Admitting: Internal Medicine

## 2020-04-01 NOTE — Assessment & Plan Note (Signed)
appt canceled

## 2020-04-01 NOTE — Progress Notes (Signed)
Appt canceled!

## 2020-06-11 ENCOUNTER — Other Ambulatory Visit: Payer: Self-pay | Admitting: Primary Care

## 2020-06-11 DIAGNOSIS — I10 Essential (primary) hypertension: Secondary | ICD-10-CM

## 2020-06-15 DIAGNOSIS — I2699 Other pulmonary embolism without acute cor pulmonale: Secondary | ICD-10-CM

## 2020-06-15 HISTORY — DX: Other pulmonary embolism without acute cor pulmonale: I26.99

## 2020-07-11 ENCOUNTER — Other Ambulatory Visit: Payer: Self-pay

## 2020-07-11 ENCOUNTER — Observation Stay
Admission: EM | Admit: 2020-07-11 | Discharge: 2020-07-12 | Disposition: A | Discharge status: Home / Self Care | Payer: Medicare Other | Attending: Obstetrics and Gynecology | Admitting: Obstetrics and Gynecology

## 2020-07-11 ENCOUNTER — Emergency Department: Payer: Medicare Other

## 2020-07-11 ENCOUNTER — Telehealth: Payer: Self-pay

## 2020-07-11 DIAGNOSIS — R0600 Dyspnea, unspecified: Secondary | ICD-10-CM | POA: Diagnosis not present

## 2020-07-11 DIAGNOSIS — Z79899 Other long term (current) drug therapy: Secondary | ICD-10-CM | POA: Insufficient documentation

## 2020-07-11 DIAGNOSIS — I2699 Other pulmonary embolism without acute cor pulmonale: Secondary | ICD-10-CM | POA: Diagnosis not present

## 2020-07-11 DIAGNOSIS — N950 Postmenopausal bleeding: Secondary | ICD-10-CM | POA: Diagnosis present

## 2020-07-11 DIAGNOSIS — Z20822 Contact with and (suspected) exposure to covid-19: Secondary | ICD-10-CM | POA: Insufficient documentation

## 2020-07-11 DIAGNOSIS — Z7982 Long term (current) use of aspirin: Secondary | ICD-10-CM | POA: Diagnosis not present

## 2020-07-11 DIAGNOSIS — Z86711 Personal history of pulmonary embolism: Secondary | ICD-10-CM | POA: Diagnosis present

## 2020-07-11 DIAGNOSIS — E876 Hypokalemia: Secondary | ICD-10-CM

## 2020-07-11 DIAGNOSIS — Z87891 Personal history of nicotine dependence: Secondary | ICD-10-CM | POA: Diagnosis not present

## 2020-07-11 DIAGNOSIS — I2693 Single subsegmental pulmonary embolism without acute cor pulmonale: Secondary | ICD-10-CM

## 2020-07-11 DIAGNOSIS — I517 Cardiomegaly: Secondary | ICD-10-CM | POA: Diagnosis present

## 2020-07-11 DIAGNOSIS — E669 Obesity, unspecified: Secondary | ICD-10-CM | POA: Diagnosis present

## 2020-07-11 DIAGNOSIS — I10 Essential (primary) hypertension: Secondary | ICD-10-CM | POA: Diagnosis not present

## 2020-07-11 DIAGNOSIS — K219 Gastro-esophageal reflux disease without esophagitis: Secondary | ICD-10-CM | POA: Diagnosis not present

## 2020-07-11 DIAGNOSIS — Z6841 Body Mass Index (BMI) 40.0 and over, adult: Secondary | ICD-10-CM

## 2020-07-11 DIAGNOSIS — R011 Cardiac murmur, unspecified: Secondary | ICD-10-CM | POA: Diagnosis present

## 2020-07-11 DIAGNOSIS — R0602 Shortness of breath: Secondary | ICD-10-CM | POA: Diagnosis present

## 2020-07-11 LAB — COMPREHENSIVE METABOLIC PANEL
ALT: 18 U/L (ref 0–44)
AST: 18 U/L (ref 15–41)
Albumin: 3.5 g/dL (ref 3.5–5.0)
Alkaline Phosphatase: 56 U/L (ref 38–126)
Anion gap: 8 (ref 5–15)
BUN: 13 mg/dL (ref 8–23)
CO2: 28 mmol/L (ref 22–32)
Calcium: 9.6 mg/dL (ref 8.9–10.3)
Chloride: 105 mmol/L (ref 98–111)
Creatinine, Ser: 0.81 mg/dL (ref 0.44–1.00)
GFR, Estimated: >60 mL/min (ref >60)
Glucose, Bld: 102 mg/dL — ABNORMAL HIGH (ref 70–99)
Potassium: 3.3 mmol/L — ABNORMAL LOW (ref 3.5–5.1)
Sodium: 141 mmol/L (ref 135–145)
Total Bilirubin: 0.5 mg/dL (ref 0.3–1.2)
Total Protein: 7 g/dL (ref 6.5–8.1)

## 2020-07-11 LAB — CBC
HCT: 37.3 % (ref 36.0–46.0)
Hemoglobin: 12.4 g/dL (ref 12.0–15.0)
MCH: 27.9 pg (ref 26.0–34.0)
MCHC: 33.2 g/dL (ref 30.0–36.0)
MCV: 84 fL (ref 80.0–100.0)
Platelets: 304 10*3/uL (ref 150–400)
RBC: 4.44 MIL/uL (ref 3.87–5.11)
RDW: 14.2 % (ref 11.5–15.5)
WBC: 7.3 10*3/uL (ref 4.0–10.5)
nRBC: 0 % (ref 0.0–0.2)

## 2020-07-11 LAB — RESP PANEL BY RT-PCR (FLU A&B, COVID) ARPGX2
Influenza A by PCR: NEGATIVE
Influenza B by PCR: NEGATIVE
SARS Coronavirus 2 by RT PCR: NEGATIVE

## 2020-07-11 LAB — PROTIME-INR
INR: 1 (ref 0.8–1.2)
Prothrombin Time: 13.2 seconds (ref 11.4–15.2)

## 2020-07-11 LAB — MAGNESIUM: Magnesium: 2 mg/dL (ref 1.7–2.4)

## 2020-07-11 LAB — BRAIN NATRIURETIC PEPTIDE: B Natriuretic Peptide: 53.2 pg/mL (ref 0.0–100.0)

## 2020-07-11 LAB — TROPONIN I (HIGH SENSITIVITY)
Troponin I (High Sensitivity): 8 ng/L (ref <18)
Troponin I (High Sensitivity): 8 ng/L (ref <18)

## 2020-07-11 LAB — APTT: aPTT: 26 seconds (ref 24–36)

## 2020-07-11 MED ORDER — SODIUM CHLORIDE 0.9% FLUSH: 3.0000 mL | INTRAVENOUS | Status: DC | PRN

## 2020-07-11 MED ORDER — HEPARIN BOLUS VIA INFUSION
6000.0000 [IU] | Freq: Once | INTRAVENOUS | Status: AC
  Administered 2020-07-11: 6000 [IU] via INTRAVENOUS
  Filled 2020-07-11: qty 6000

## 2020-07-11 MED ORDER — ALBUTEROL SULFATE HFA 108 (90 BASE) MCG/ACT IN AERS: 2.0000 | INHALATION_SPRAY | Freq: Four times a day (QID) | RESPIRATORY_TRACT | 2 refills | Status: AC | PRN

## 2020-07-11 MED ORDER — HEPARIN (PORCINE) 25000 UT/250ML-% IV SOLN
1500.0000 [IU]/h | INTRAVENOUS | Status: DC
  Administered 2020-07-11: 1500 [IU]/h via INTRAVENOUS
  Filled 2020-07-11: qty 250

## 2020-07-11 MED ORDER — ALBUTEROL SULFATE (2.5 MG/3ML) 0.083% IN NEBU: 2.5000 mg | INHALATION_SOLUTION | RESPIRATORY_TRACT | Status: DC | PRN

## 2020-07-11 MED ORDER — PANTOPRAZOLE SODIUM 40 MG PO TBEC
40.0000 mg | DELAYED_RELEASE_TABLET | Freq: Every day | ORAL | Status: DC
  Administered 2020-07-11 – 2020-07-12 (×2): 40 mg via ORAL
  Filled 2020-07-11 (×2): qty 1

## 2020-07-11 MED ORDER — LOSARTAN POTASSIUM 50 MG PO TABS
50.0000 mg | ORAL_TABLET | Freq: Every day | ORAL | Status: DC
  Administered 2020-07-12: 50 mg via ORAL
  Filled 2020-07-11 (×2): qty 1

## 2020-07-11 MED ORDER — POTASSIUM CHLORIDE CRYS ER 20 MEQ PO TBCR
40.0000 meq | EXTENDED_RELEASE_TABLET | Freq: Once | ORAL | Status: AC
  Administered 2020-07-11: 40 meq via ORAL
  Filled 2020-07-11: qty 2

## 2020-07-11 MED ORDER — IOHEXOL 350 MG/ML SOLN
100.0000 mL | Freq: Once | INTRAVENOUS | Status: AC | PRN
  Administered 2020-07-11: 100 mL via INTRAVENOUS
  Filled 2020-07-11: qty 100

## 2020-07-11 MED ORDER — ACETAMINOPHEN 325 MG PO TABS: 650.0000 mg | ORAL_TABLET | Freq: Four times a day (QID) | ORAL | Status: DC | PRN

## 2020-07-11 MED ORDER — HYDROCODONE-ACETAMINOPHEN 5-325 MG PO TABS: 1.0000 | ORAL_TABLET | ORAL | Status: DC | PRN

## 2020-07-11 MED ORDER — SODIUM CHLORIDE 0.9 % IV SOLN: 250.0000 mL | INTRAVENOUS | Status: DC | PRN

## 2020-07-11 MED ORDER — ACETAMINOPHEN 650 MG RE SUPP: 650.0000 mg | Freq: Four times a day (QID) | RECTAL | Status: DC | PRN

## 2020-07-11 MED ORDER — IPRATROPIUM-ALBUTEROL 0.5-2.5 (3) MG/3ML IN SOLN
3.0000 mL | Freq: Once | RESPIRATORY_TRACT | Status: AC
  Administered 2020-07-11: 3 mL via RESPIRATORY_TRACT
  Filled 2020-07-11: qty 3

## 2020-07-11 MED ORDER — SODIUM CHLORIDE 0.9% FLUSH
3.0000 mL | Freq: Two times a day (BID) | INTRAVENOUS | Status: DC
  Administered 2020-07-11: 3 mL via INTRAVENOUS

## 2020-07-11 NOTE — Telephone Encounter (Signed)
Pt called back and she stopped at [facility] on way [facility] for covid test that was neg. Pt said the [facility] was concerned about a PE and strong hx of pts family having heart failure; also pt had told me earlier that she did not have swelling in lower legs but [facility] advised pt she had 1+ pitting edema from knees down. Pt went to [facility] and they advised pt without seeing pt to go to ED for eval and testing. Pt called to see what was going on; I advised that when I talked with pt she did not sound out of breath or in any distress. Pt said that was correct but if pt is up moving around she does have SOB.pt still declines 911 and will go to [facility] ED now after grabbing a jacket.  Sending note to Gentry Fitz NP.

## 2020-07-11 NOTE — ED Provider Notes (Signed)
I assumed care of this patient from Dr. Quentin Cornwall at about 8 PM.  In brief patient presents with some shortness of breath after recent flight.  She is pending CTA to rule out PE as remainder of work-up is been unremarkable for evidence of ACS pneumonia or significant metabolic derangements or anemia.  CTA does show evidence of a small subsegmental PE given she is tachypneic and complaining of shortness of breath will admit and start heparin.  .Critical Care Performed by: Lucrezia Starch, MD Authorized by: Lucrezia Starch, MD   Critical care provider statement:    Critical care time (minutes):  45   Critical care was necessary to treat or prevent imminent or life-threatening deterioration of the following conditions:  Circulatory failure   Critical care was time spent personally by me on the following activities:  Discussions with consultants, evaluation of patient's response to treatment, examination of patient, ordering and performing treatments and interventions, ordering and review of laboratory studies, ordering and review of radiographic studies, pulse oximetry, re-evaluation of patient's condition, obtaining history from patient or surrogate and review of old charts   I assumed direction of critical care for this patient from another provider in my specialty: yes     Care discussed with: admitting provider        Lucrezia Starch, MD 07/11/20 2054

## 2020-07-11 NOTE — Telephone Encounter (Signed)
Pt said that she has been out of town in conference for 4 days; pt just found out she was in the car traveling to and from [facility] with someone that tested positive for covid on 07/09/20(no mask and no social distancing). Pt has a tracker and she usually walks 1,000 - 7,000 steps a day but while at conference pt walked 9,000 - 15,000 steps a day for the 4 days of conference. Starting on 07/06/20 pt began with swelling in both feet and ankles and when pt pushes on ankles it leaves an indention. Pt said no redness, warmth upon touching and no real pain and no pain or swelling in lower legs. Pt said any walking causes pt to be winded. Pt said walking from her car to her house causes pt to be so winded that she has to sit down and rest. No CP,H/A, or dizziness. Pt said new symptom of labored breathing with any exertion. Pt is going to [facility] in [place] for eval and testing now. Pt said she is sitting so no distress with breathing now and pt declined 911; pt said will go by car to [facility].Sending note to Gentry Fitz NP.

## 2020-07-11 NOTE — ED Triage Notes (Signed)
Pt to ER via POV with complaints of worsening SHOB and leg edema to her knees since Monday.   Pt reports SHOB initially started in February, states she had gained weight and thought she felt like way because of it. States she was at a convention this weekend when she noticed it was worse. Denies chest pain or dizziness.

## 2020-07-11 NOTE — Consult Note (Signed)
ANTICOAGULATION CONSULT NOTE  Pharmacy Consult for IV Heparin Indication: pulmonary embolus  Patient Measurements: Heparin Dosing Weight: 87.3 kg  Labs: Recent Labs    07/11/20 1818  HGB 12.4  HCT 37.3  PLT 304  CREATININE 0.81  TROPONINIHS 8    Estimated Creatinine Clearance: 91.9 mL/min (by C-G formula based on SCr of 0.81 mg/dL).   Medical History: Past Medical History:  Diagnosis Date  . Allergy   . Gallbladder polyp 2012  . GERD (gastroesophageal reflux disease)   . Hypertension   . Iron deficiency anemia   . Menopausal symptoms    since 2007  . Murmur, cardiac   . Vitamin D deficiency     Medications:  No anticoagulation prior to admission per my chart review  Assessment: Patient is a 65 y/o F with medical history as above who presented to the ED 4/27 with shortness of breath and leg edema. CTA findings of "isolated subsegmental pulmonary embolus in the left upper lobe". Bilateral leg ultrasound did not detect DVT. Pharmacy has been consulted to initiate heparin infusion for PE.  Baseline CBC within normal limits. Baseline aPTT and PT-INR pending.  Goal of Therapy:  Heparin level 0.3-0.7 units/ml Monitor platelets by anticoagulation protocol: Yes   Plan:  --Heparin 6000 unit IV bolus followed by continuous infusion at 1500 units/hr --Heparin level 6 hours after initiation of infusion --Daily CBC per protocol while on IV heparin  Benita Gutter 07/11/2020,8:44 PM

## 2020-07-11 NOTE — H&P (Signed)
Paige Cashnnette J Szatkowski ZOX:[id number] DOB: 10/08/1955 DOA: 07/11/2020    PCP: Doreene Nestlark, Katherine K, NP   Outpatient Specialists: NONE    Patient arrived to ER on 07/11/20 at 1731 Referred by Attending No att. providers found   Patient coming from: home Lives alone,     Chief Complaint:   Chief Complaint  Patient presents with  . Shortness of Breath    HPI: Paige Hester is a 65 y.o. female with medical history significant of hypertension GERD, iron deficiency anemia, heart murmur  Presented with shortness of breath and leg swelling she recently traveled to [place] by [place] few days ago.  Was negative for COVID today no fevers or chills She feels maybe her shortness of breath been going on almost a month or so No chest pain, she had to stop walking every few steps  Quit smoking 1994 Occasional [place] Reports bilateral leg edema for at least few months with exertional shortness of breath has been getting worse lately initially attributed to gaining weight Patient reports she snores at night but have never had  sleep study She is current on mammograms and colonoscopies Taking Megace to help stop heavy bleeding No history of heart failure No family history of blood clots but she does have mother who died in 460s from coronary artery disease Infectious risk factors:  Reports  shortness of breath,     Has  been vaccinated against COVID and boosted   Initial COVID TEST   in house  PCR testing  Pending  Lab Results  Component Value Date   SARSCOV2NAA Not Detected 03/24/2020   SARSCOV2NAA Not Detected 09/23/2019   SARSCOV2NAA NEGATIVE 07/23/2019   SARSCOV2NAA Not Detected 06/22/2019    Regarding pertinent Chronic problems:     HTN on HCTZ Cozaar verapamil      Morbid obesity-   BMI Readings from Last 1 Encounters:  07/11/20 45.76 kg/m      While in ER: Noted to be severely hypertensive with initial blood pressure 215/77 Chest x-ray showing no evidence of edema BNP  and troponin unremarkable CT showed subsegmental pulmonary embolism in left upper lobe evidence of pulmonary hypertension Cardiomegaly and coronary artery calcifications  ED Triage Vitals [07/11/20 1744]  Enc Vitals Group     BP (!) 215/77     Pulse Rate 72     Resp (!) 22     Temp 98.1 F (36.7 C)     Temp Source Oral     SpO2 98 %     Weight 275 lb (124.7 kg)     Height 5\' 5"  (1.651 m)     Head Circumference      Peak Flow      Pain Score 0     Pain Loc      Pain Edu?      Excl. in GC?   WJXB(14)@TMAX(24)@     _________________________________________ Significant initial  Findings: Abnormal Labs Reviewed  COMPREHENSIVE METABOLIC PANEL - Abnormal; Notable for the following components:      Result Value   Potassium 3.3 (*)    Glucose, Bld 102 (*)    All other components within normal limits   ____________________________________________ Ordered   CXR -  NON acute  Doppler bilateral ext  CTA chest -  PE,   no evidence of infiltrate   _________________________ Troponind 8 ECG: Ordered Personally reviewed by me showing: HR : 70 Rhythm:  NSR,    no evidence of ischemic changes QTC 453 _____________  The recent clinical data is shown below. Vitals:   07/11/20 1744 07/11/20 1949  BP: (!) 215/77 (!) 188/85  Pulse: 72 69  Resp: (!) 22 20  Temp: 98.1 F (36.7 C)   TempSrc: Oral   SpO2: 98% 100%  Weight: 124.7 kg   Height: 5\' 5"  (1.651 m)       WBC     Component Value Date/Time   WBC 7.3 07/11/2020 1818   LYMPHSABS 2.5 11/05/2016 0835   MONOABS 0.6 11/05/2016 0835   EOSABS 0.4 11/05/2016 0835   BASOSABS 0.1 11/05/2016 0835       UA  ordered     Results for orders placed or performed in visit on 03/24/20  Novel Coronavirus, NAA ([facility])     Status: None   Collection Time: 03/24/20 11:39 AM   Specimen: Nasopharyngeal(NP) swabs in vial transport medium   Nasopharynge  Screenin  Result Value Ref Range Status   SARS-CoV-2, NAA Not Detected Not Detected  Final    Comment: Testing was performed using the cobas(R) SARS-CoV-2 test. This nucleic acid amplification test was developed and its performance characteristics determined by [employer]. Nucleic acid amplification tests include RT-PCR and TMA. This test has not been FDA cleared or approved. This test has been authorized by FDA under an Emergency Use Authorization (EUA). This test is only authorized for the duration of time the declaration that circumstances exist justifying the authorization of the emergency use of in vitro diagnostic tests for detection of SARS-CoV-2 virus and/or diagnosis of COVID-19 infection under section 564(b)(1) of the Act, 21 U.S.C. 616WVP-7(T) (1), unless the authorization is terminated or revoked sooner. When diagnostic testing is negative, the possibility of a false negative result should be considered in the context of a patient's recent exposures and the presence of clinical signs and symptoms consistent with COVID-19. An individual without sympto ms of COVID-19 and who is not shedding SARS-CoV-2 virus would expect to have a negative (not detected) result in this assay.      _______________________________________________ Hospitalist was called for admission for pulmonary embolus  The following Work up has been ordered so far:  Orders Placed This Encounter  Procedures  . Resp Panel by RT-PCR (Flu A&B, Covid) Nasopharyngeal Swab  . DG Chest 2 View  . US Venous Img Lower Bilateral  . CT Angio Chest PE W and/or Wo Contrast  . CBC  . Comprehensive metabolic panel  . Brain natriuretic peptide  . APTT  . Protime-INR  . heparin per pharmacy consult  . Consult to hospitalist  . Airborne and Contact precautions  . EKG 12-Lead  . ED EKG    Following Medications were ordered in ER: Medications  losartan (COZAAR) tablet 50 mg (has no administration in time range)  ipratropium-albuterol (DUONEB) 0.5-2.5 (3) MG/3ML nebulizer solution 3 mL (3  mLs Nebulization Given 07/11/20 1948)  iohexol (OMNIPAQUE) 350 MG/ML injection 100 mL (100 mLs Intravenous Contrast Given 07/11/20 2003)        Consult Orders  (From admission, onward)         Start     Ordered   07/11/20 2041  Consult to hospitalist  Once       Provider:  (Not yet assigned)  Question Answer Comment  Place call to: [phone]   Reason for Consult Admit   Diagnosis/Clinical Info for Consult: PE      07/11/20 2040            OTHER Significant initial  Findings:  labs  showing:    Recent Labs  Lab 07/11/20 1818  NA 141  K 3.3*  CO2 28  GLUCOSE 102*  BUN 13  CREATININE 0.81  CALCIUM 9.6    Cr   stable,    Lab Results  Component Value Date   CREATININE 0.81 07/11/2020   CREATININE 0.85 11/18/2019   CREATININE 0.81 07/25/2019    Recent Labs  Lab 07/11/20 1818  AST 18  ALT 18  ALKPHOS 56  BILITOT 0.5  PROT 7.0  ALBUMIN 3.5   Lab Results  Component Value Date   CALCIUM 9.6 07/11/2020      Plt: Lab Results  Component Value Date   PLT 304 07/11/2020        Recent Labs  Lab 07/11/20 1818  WBC 7.3  HGB 12.4  HCT 37.3  MCV 84.0  PLT 304    HG/HCT  stable,      Component Value Date/Time   HGB 12.4 07/11/2020 1818   HCT 37.3 07/11/2020 1818   MCV 84.0 07/11/2020 1818    Recent Labs    07/11/20 1752  BNP 53.2     DM  labs:  HbA1C: Recent Labs    11/18/19 1205  HGBA1C 6.0       Radiological Exams on Admission: DG Chest 2 View  Result Date: 07/11/2020 CLINICAL DATA:  Shortness of breath EXAM: CHEST - 2 VIEW COMPARISON:  None. FINDINGS: Lungs are clear. Heart is borderline enlarged with pulmonary vascularity normal. No adenopathy. There is aortic atherosclerosis. No bone lesions. IMPRESSION: Lungs clear. Borderline cardiac enlargement. Aortic Atherosclerosis (ICD10-I70.0). Electronically Signed   By: Lowella Grip III M.D.   On: 07/11/2020 18:42   CT Angio Chest PE W and/or Wo Contrast  Result Date:  07/11/2020 CLINICAL DATA:  PE suspected, high prob Shortness of breath.  Increasing leg edema. EXAM: CT ANGIOGRAPHY CHEST WITH CONTRAST TECHNIQUE: Multidetector CT imaging of the chest was performed using the standard protocol during bolus administration of intravenous contrast. Multiplanar CT image reconstructions and MIPs were obtained to evaluate the vascular anatomy. CONTRAST:  140mL OMNIPAQUE IOHEXOL 350 MG/ML SOLN COMPARISON:  Radiograph earlier today. FINDINGS: Cardiovascular: Isolated subsegmental pulmonary embolus in the left upper lobe, series 4, image 23. No filling defects in the right pulmonary arteries. Prominence of the main pulmonary artery at 3.5 cm. Aortic atherosclerosis. Mild cardiomegaly with coronary artery calcifications. Mitral annulus calcifications. No pericardial effusion. Mediastinum/Nodes: Prominent right upper paratracheal node measures 10 mm. No hilar adenopathy. No thyroid nodule. Decompressed esophagus. Lungs/Pleura: No focal airspace disease or pulmonary infarct. No pleural fluid. Mild heterogeneous pulmonary parenchyma. Trachea and central bronchi are patent. Upper Abdomen: Cyst in the right hepatic lobe is partially included. Gallstones. No acute upper abdominal finding Musculoskeletal: Mild scoliosis. There are no acute or suspicious osseous abnormalities. Review of the MIP images confirms the above findings. IMPRESSION: 1. Positive for isolated subsegmental pulmonary embolus in the left upper lobe. 2. Prominence of the main pulmonary artery suggesting pulmonary arterial hypertension. 3. Cardiomegaly with coronary artery calcifications. 4. Mild heterogeneous pulmonary parenchyma, can be seen with small airways disease. 5. Incidental note of gallstones. Aortic Atherosclerosis (ICD10-I70.0). Critical Value/emergent results were called by telephone at the time of interpretation on 07/11/2020 at 8:35 pm to Dr Tamala Julian, who verbally acknowledged these results. Electronically Signed   By:  Keith Rake M.D.   On: 07/11/2020 20:35   US Venous Img Lower Bilateral  Result Date: 07/11/2020 CLINICAL DATA:  Leg swelling after recent travel. Shortness of  breath for 3 months. Pain, edema, spider veins, anticoagulation therapy. EXAM: Bilateral LOWER EXTREMITY VENOUS DOPPLER ULTRASOUND TECHNIQUE: Gray-scale sonography with compression, as well as color and duplex ultrasound, were performed to evaluate the deep venous system(s) from the level of the common femoral vein through the popliteal and proximal calf veins. COMPARISON:  None. FINDINGS: VENOUS Normal compressibility of the common femoral, superficial femoral, and popliteal veins, as well as the visualized calf veins. Visualized portions of profunda femoral vein and great saphenous vein unremarkable. No filling defects to suggest DVT on grayscale or color Doppler imaging. Doppler waveforms show normal direction of venous flow, normal respiratory plasticity and response to augmentation. Limited views of the contralateral common femoral vein are unremarkable. OTHER None. Limitations: none IMPRESSION: No evidence of acute deep venous thrombosis in the visualized lower extremity veins. Electronically Signed   By: Lucienne Capers M.D.   On: 07/11/2020 19:36   _______________________________________________________________________________________________________ Latest  Blood pressure (!) 188/85, pulse 69, temperature 98.1 F (36.7 C), temperature source Oral, resp. rate 20, height 5\' 5"  (1.651 m), weight 124.7 kg, SpO2 100 %.   Review of Systems:    Pertinent positives include:  shortness of breath at rest.   dyspnea on exertion,  Bilateral lower extremity swelling   Constitutional:  No weight loss, night sweats, Fevers, chills, fatigue, weight loss  HEENT:  No headaches, Difficulty swallowing,Tooth/dental problems,Sore throat,  No sneezing, itching, ear ache, nasal congestion, post nasal drip,  Cardio-vascular:  No chest pain,  Orthopnea, PND, anasarca, dizziness, palpitations.noGI:  No heartburn, indigestion, abdominal pain, nausea, vomiting, diarrhea, change in bowel habits, loss of appetite, melena, blood in stool, hematemesis Resp:  no  No excess mucus, no productive cough, No non-productive cough, No coughing up of blood.No change in color of mucus.No wheezing. Skin:  no rash or lesions. No jaundice GU:  no dysuria, change in color of urine, no urgency or frequency. No straining to urinate.  No flank pain.  Musculoskeletal:  No joint pain or no joint swelling. No decreased range of motion. No back pain.  Psych:  No change in mood or affect. No depression or anxiety. No memory loss.  Neuro: no localizing neurological complaints, no tingling, no weakness, no double vision, no gait abnormality, no slurred speech, no confusion  All systems reviewed and apart from [place] all are negative _______________________________________________________________________________________________ Past Medical History:   Past Medical History:  Diagnosis Date  . Allergy   . Gallbladder polyp 2012  . GERD (gastroesophageal reflux disease)   . Hypertension   . Iron deficiency anemia   . Menopausal symptoms    since 2007  . Murmur, cardiac   . Vitamin D deficiency       Past Surgical History:  Procedure Laterality Date  . COLONOSCOPY  2010   2020  . HYSTEROSCOPY WITH D & C N/A 07/27/2019   Procedure: DILATATION AND CURETTAGE /HYSTEROSCOPY, Polypectomy;  Surgeon: Osborne Oman, MD;  Location: [facility];  Service: Gynecology;  Laterality: N/A;    Social History:  Ambulatory  independently       reports that she quit smoking about 23 years ago. She has never used smokeless tobacco. She reports current alcohol use of about 7.0 standard drinks of alcohol per week. She reports that she does not use drugs.   Family History:   Family History  Problem Relation Age of Onset  . Heart disease Mother    . Hypertension Mother   . Heart attack Mother 73  .  Prostate cancer Father   . Heart disease Father 8       CAD, STENT  . Heart disease Brother        Myocardial infarction  . Colon cancer Maternal Aunt   . Coronary artery disease Sister   . Colon polyps Neg Hx   . Esophageal cancer Neg Hx   . Rectal cancer Neg Hx   . Stomach cancer Neg Hx    ______________________________________________________________________________________________ Allergies: No Known Allergies   Prior to Admission medications   Medication Sig Start Date End Date Taking? Authorizing Provider  albuterol (VENTOLIN HFA) 108 (90 Base) MCG/ACT inhaler Inhale 2 puffs into the lungs every 6 (six) hours as needed for wheezing or shortness of breath. 07/11/20  Yes Merlyn Lot, MD  aspirin 81 MG tablet Take 81 mg by mouth daily.    [provider]  Cholecalciferol (VITAMIN D3) 1000 UNITS tablet Take 1,000 Units by mouth daily.      [provider]  docusate sodium (COLACE) 100 MG capsule Take 1 capsule (100 mg total) by mouth 2 (two) times daily as needed for mild constipation or moderate constipation. 07/27/19   Anyanwu, Sallyanne Havers, MD  fluticasone (FLONASE) 50 MCG/ACT nasal spray Place 1 spray into both nostrils 2 (two) times daily as needed for allergies or rhinitis. 09/08/19   Pleas Koch, NP  hydrochlorothiazide (MICROZIDE) 12.5 MG capsule Take 1 capsule (12.5 mg total) by mouth daily. For blood pressure. 12/26/19   Pleas Koch, NP  ibuprofen (ADVIL) 600 MG tablet Take 1 tablet (600 mg total) by mouth every 6 (six) hours as needed for headache, mild pain, moderate pain or cramping. 07/27/19   Anyanwu, Sallyanne Havers, MD  losartan (COZAAR) 50 MG tablet Take 1 tablet (50 mg total) by mouth daily. For blood pressure. 06/11/20   Pleas Koch, NP  megestrol (MEGACE) 40 MG tablet TAKE 1 TABLET BY MOUTH TWICE DAILY CAN  INCREASE  TO  2  TABLETS  TWICE  DAILY  IN  THE  EVENT  OF  HEAVY  BLEEDING  02/03/20   Anyanwu, Sallyanne Havers, MD  Melatonin 10 MG TABS Take by mouth as needed.    [provider]  omeprazole (PRILOSEC) 20 MG capsule Take 20 mg by mouth daily.    [provider]  Potassium 99 MG TABS Take 1 tablet by mouth daily.    [provider]  verapamil (VERELAN PM) 360 MG 24 hr capsule Take 1 capsule (360 mg total) by mouth at bedtime. 12/26/19   Pleas Koch, NP    ___________________________________________________________________________________________________ Physical Exam: Vitals with BMI 07/11/2020 07/11/2020 01/19/2020  Height - 5\' 5"  5\' 4"   Weight - 275 lbs 268 lbs 2 oz  BMI - 0000000 46  Systolic 0000000 [id number] -  Diastolic 85 77 -  Pulse 69 72 -    1. General:  in No  Acute distress    Chronically ill -appearing 2. Psychological: Alert and Oriented 3. Head/ENT:     Dry Mucous Membranes                          Head Non traumatic, neck supple                          Normal  Dentition 4. SKIN: normal  Skin turgor,  Skin clean Dry and intact no rash 5. Heart: Regular rate and rhythm + cardiac  Murmur, no Rub or gallop 6. Lungs:   no wheezes or crackles   7. Abdomen: Soft,  non-tender, Non distended   obese  bowel sounds present 8. Lower extremities: no clubbing, cyanosis, 2+ edema 9. Neurologically Grossly intact, moving all 4 extremities equally  10. MSK: Normal range of motion    Chart has been reviewed  ______________________________________________________________________________________________  Assessment/Plan   65 y.o. female with medical history significant of hypertension GERD, iron deficiency anemia, heart murmur  Admitted for pulmonary embolus  Present on Admission: . Pulmonary embolism ([place]) -  Admit to  telemetry   Initiate heparin drip  Would likely benefit from case manager consult for long term anticoagulation Hold home blood pressure medications avoid hypotension Cycle cardiac enzymes Order echogram  lower  extremity Dopplers - NEG for DVT 07/11/20  Most likely risk factors for hypercoagulable state being  obesity    hormonal therapy  recent travel  sedentary lifestyle       . Postmenopausal bleeding -hold off on Megace for now given evidence of cardiomegaly with edema and PE  . Obesity, unspecified -we will need nutritional consult has not Patient  . MURMUR, CARDIAC, UNDIAGNOSED-  chronic stable   . GERD (gastroesophageal reflux disease) chronic stable continue PPI   . Essential hypertension allow permissive hypertension for tonight  . Cardiomegaly with evidence of chronic pulmonary hypertension.  Obtain echogram will need to evaluate right heart function.  Patient with history of snoring and potential sleep apnea versus obesity hypoventilation syndrome.  Will probably benefit from outpatient follow-up and sleep study as well Bicarb up to 28 may suggest some chronic CO2 retention although mild  Other plan as per orders.  DVT prophylaxis:  heparin       Code Status:    Code Status: Prior FULL CODE  as per patient   I had personally discussed CODE STATUS with patient    Family Communication:   Family not at  Bedside   Disposition Plan:      To home once workup is complete and patient is stable   Following barriers for discharge:                            Electrolytes corrected                                                     Consults called: none  Admission status:  ED Disposition    ED Disposition Condition [place]: [place] [[id number]]  Level of Care: Med-Surg [16]  Covid Evaluation: Confirmed COVID Negative  Date Laboratory Confirmed COVID Negative: 07/11/2020  Diagnosis: Pulmonary embolism ([place]) [[id number]]  Admitting Physician: Toy Baker [[id number]]  Attending Physician: Toy Baker [[id number]]        Obs      Level of care    tele  For  24H      Lab Results  Component Value Date   [place]  07/11/2020     Precautions: admitted as  Covid Negative    PPE: Used by the provider:   N95   eye Goggles,  Gloves      Mirren Gest 07/11/2020, 10:07 PM    [facility]     after 2 AM please page floor coverage PA  If 7AM-7PM, please contact the day team taking care of the patient using [email or web address]   Patient was evaluated in the context of the global COVID-19 pandemic, which necessitated consideration that the patient might be at risk for infection with the SARS-CoV-2 virus that causes COVID-19. Institutional protocols and algorithms that pertain to the evaluation of patients at risk for COVID-19 are in a state of rapid change based on information released by regulatory bodies including the CDC and federal and state organizations. These policies and algorithms were followed during the patient's care.

## 2020-07-11 NOTE — Telephone Encounter (Signed)
Noted and agree. Will await notes from [facility].

## 2020-07-11 NOTE — ED Provider Notes (Signed)
[facility] Emergency Department Provider Note    Event Date/Time   First MD Initiated Contact with Patient 07/11/20 1739     (approximate)  I have reviewed the triage vital signs and the nursing notes.   HISTORY  Chief Complaint Shortness of Breath    HPI Paige Hester is a 65 y.o. female   below listed past medical history presents to the ER for evaluation shortness of breath as well as lower extremity swelling.  Symptoms started after she just recently went to a conference in [place].  She drove down there.  [facility] was [facility] for [facility] today and was negative.  Not having any fevers or chills.  Does have family history of CHF.  She is on HCTZ.  Denies any chest pain or pressure.  States that more she thinks about that the symptoms have likely been ongoing for more than just a few days and have likely been going on for more than a month or so but becoming increasingly worse.  Does have history of mild mitral regurg.  Echo in 2019 showed preserved EF.  No previous diagnosis of CHF.   Past Medical History:  Diagnosis Date  . Allergy   . Gallbladder polyp 2012  . GERD (gastroesophageal reflux disease)   . Hypertension   . Iron deficiency anemia   . Menopausal symptoms    since 2007  . Murmur, cardiac   . Vitamin D deficiency    Family History  Problem Relation Age of Onset  . Heart disease Mother   . Hypertension Mother   . Heart attack Mother 39  . Prostate cancer Father   . Heart disease Father 23       CAD, STENT  . Heart disease Brother        Myocardial infarction  . Colon cancer Maternal Aunt   . Coronary artery disease Sister   . Colon polyps Neg Hx   . Esophageal cancer Neg Hx   . Rectal cancer Neg Hx   . Stomach cancer Neg Hx    Past Surgical History:  Procedure Laterality Date  . COLONOSCOPY  2010   2020  . HYSTEROSCOPY WITH D & C N/A 07/27/2019   Procedure: DILATATION AND CURETTAGE /HYSTEROSCOPY,  Polypectomy;  Surgeon: Osborne Oman, MD;  Location: [facility];  Service: Gynecology;  Laterality: N/A;   Patient Active Problem List   Diagnosis Date Noted  . Endometrial thickness of 18 mm and associated bleeding in postmenopausal patient 07/27/2019  . Mild dysplasia of cervix (CIN I) 10/12/2018  . Postmenopausal bleeding 08/16/2018  . Prediabetes 11/04/2016  . BPPV (benign paroxysmal positional vertigo) 12/01/2014  . Preventative health care 08/30/2014  . Obesity, unspecified 08/10/2013  . GERD (gastroesophageal reflux disease) 05/31/2012  . Ulnar tunnel syndrome 07/02/2010  . MURMUR, CARDIAC, UNDIAGNOSED 09/20/2008  . URI (upper respiratory infection) 02/05/2008  . ECZEMA 02/16/2007  . Essential hypertension 12/19/2006      Prior to Admission medications   Medication Sig Start Date End Date Taking? Authorizing Provider  albuterol (VENTOLIN HFA) 108 (90 Base) MCG/ACT inhaler Inhale 2 puffs into the lungs every 6 (six) hours as needed for wheezing or shortness of breath. 07/11/20  Yes Merlyn Lot, MD  aspirin 81 MG tablet Take 81 mg by mouth daily.    [provider]  Cholecalciferol (VITAMIN D3) 1000 UNITS tablet Take 1,000 Units by mouth daily.      [provider]  docusate  sodium (COLACE) 100 MG capsule Take 1 capsule (100 mg total) by mouth 2 (two) times daily as needed for mild constipation or moderate constipation. 07/27/19   Anyanwu, Sallyanne Havers, MD  fluticasone (FLONASE) 50 MCG/ACT nasal spray Place 1 spray into both nostrils 2 (two) times daily as needed for allergies or rhinitis. 09/08/19   Pleas Koch, NP  hydrochlorothiazide (MICROZIDE) 12.5 MG capsule Take 1 capsule (12.5 mg total) by mouth daily. For blood pressure. 12/26/19   Pleas Koch, NP  ibuprofen (ADVIL) 600 MG tablet Take 1 tablet (600 mg total) by mouth every 6 (six) hours as needed for headache, mild pain, moderate pain or cramping. 07/27/19   Anyanwu, Sallyanne Havers, MD  losartan (COZAAR) 50 MG tablet Take 1 tablet (50 mg total) by mouth daily. For blood pressure. 06/11/20   Pleas Koch, NP  megestrol (MEGACE) 40 MG tablet TAKE 1 TABLET BY MOUTH TWICE DAILY CAN  INCREASE  TO  2  TABLETS  TWICE  DAILY  IN  THE  EVENT  OF  HEAVY  BLEEDING 02/03/20   Anyanwu, Sallyanne Havers, MD  Melatonin 10 MG TABS Take by mouth as needed.    [provider]  omeprazole (PRILOSEC) 20 MG capsule Take 20 mg by mouth daily.    [provider]  Potassium 99 MG TABS Take 1 tablet by mouth daily.    [provider]  verapamil (VERELAN PM) 360 MG 24 hr capsule Take 1 capsule (360 mg total) by mouth at bedtime. 12/26/19   Pleas Koch, NP    Allergies Patient has no known allergies.    Social History Social History   Tobacco Use  . Smoking status: Former Smoker    Quit date: 1999    Years since quitting: 23.3  . Smokeless tobacco: Never Used  Substance Use Topics  . Alcohol use: Yes    Alcohol/week: 7.0 standard drinks    Types: 7 Glasses of wine per week  . Drug use: No    Review of Systems Patient denies headaches, rhinorrhea, blurry vision, numbness, shortness of breath, chest pain, edema, cough, abdominal pain, nausea, vomiting, diarrhea, dysuria, fevers, rashes or hallucinations unless otherwise stated above in HPI. ____________________________________________   PHYSICAL EXAM:  VITAL SIGNS: Vitals:   07/11/20 1744 07/11/20 1949  BP: (!) 215/77 (!) 188/85  Pulse: 72 69  Resp: (!) 22 20  Temp: 98.1 F (36.7 C)   SpO2: 98% 100%    Constitutional: Alert and oriented.  Eyes: Conjunctivae are normal.  Head: Atraumatic. Nose: No congestion/rhinnorhea. Mouth/Throat: Mucous membranes are moist.   Neck: No stridor. Painless ROM.  Cardiovascular: Normal rate, regular rhythm. Holosystolic murmur.  Good peripheral circulation. Respiratory: Normal respiratory effort.  No retractions. Lungs CTAB. Gastrointestinal: Soft and  nontender. No distention. No abdominal bruits. No CVA tenderness. Genitourinary:  Musculoskeletal: No lower extremity tenderness, 2+ pitting BLE edema.  No joint effusions. Neurologic:  Normal speech and language. No gross focal neurologic deficits are appreciated. No facial droop Skin:  Skin is warm, dry and intact. No rash noted. Psychiatric: Mood and affect are normal. Speech and behavior are normal.  ____________________________________________   LABS (all labs ordered are listed, but only abnormal results are displayed)  Results for orders placed or performed during the hospital encounter of 07/11/20 (from the past 24 hour(s))  Brain natriuretic peptide     Status: None   Collection Time: 07/11/20  5:52 PM  Result Value Ref Range   B  Natriuretic Peptide 53.2 0.0 - 100.0 pg/mL  CBC     Status: None   Collection Time: 07/11/20  6:18 PM  Result Value Ref Range   WBC 7.3 4.0 - 10.5 K/uL   RBC 4.44 3.87 - 5.11 MIL/uL   Hemoglobin 12.4 12.0 - 15.0 g/dL   HCT 37.3 36.0 - 46.0 %   MCV 84.0 80.0 - 100.0 fL   MCH 27.9 26.0 - 34.0 pg   MCHC 33.2 30.0 - 36.0 g/dL   RDW 14.2 11.5 - 15.5 %   Platelets 304 150 - 400 K/uL   nRBC 0.0 0.0 - 0.2 %  Comprehensive metabolic panel     Status: Abnormal   Collection Time: 07/11/20  6:18 PM  Result Value Ref Range   Sodium 141 135 - 145 mmol/L   Potassium 3.3 (L) 3.5 - 5.1 mmol/L   Chloride 105 98 - 111 mmol/L   CO2 28 22 - 32 mmol/L   Glucose, Bld 102 (H) 70 - 99 mg/dL   BUN 13 8 - 23 mg/dL   Creatinine, Ser 0.81 0.44 - 1.00 mg/dL   Calcium 9.6 8.9 - 10.3 mg/dL   Total Protein 7.0 6.5 - 8.1 g/dL   Albumin 3.5 3.5 - 5.0 g/dL   AST 18 15 - 41 U/L   ALT 18 0 - 44 U/L   Alkaline Phosphatase 56 38 - 126 U/L   Total Bilirubin 0.5 0.3 - 1.2 mg/dL   GFR, Estimated >60 >60 mL/min   Anion gap 8 5 - 15  Troponin I (High Sensitivity)     Status: None   Collection Time: 07/11/20  6:18 PM  Result Value Ref Range   Troponin I (High Sensitivity) 8  <18 ng/L   ____________________________________________  EKG My review and personal interpretation at Time: 17:43   Indication: sob  Rate: 70  Rhythm: sinus Axis: normal Other: no stemi, lateral twi ____________________________________________  RADIOLOGY  I personally reviewed all radiographic images ordered to evaluate for the above acute complaints and reviewed radiology reports and findings.  These findings were personally discussed with the patient.  Please see medical record for radiology report.  ____________________________________________   PROCEDURES  Procedure(s) performed:  Procedures    Critical Care performed: no ____________________________________________   INITIAL IMPRESSION / ASSESSMENT AND PLAN / ED COURSE  Pertinent labs & imaging results that were available during my care of the patient were reviewed by me and considered in my medical decision making (see chart for details).   DDX: Asthma, copd, CHF, pna, ptx, malignancy, Pe, anemia   KYNSLEIGH MIU is a 66 y.o. who presents to the ED with presentation as described above presenting with some mild edema as well as shortness of breath after recent trip to [place].  COVID test was negative today.  No known history of CHF but does have known murmur.  No history of bronchitis or asthma.  She not complaining chest pain right now.  Is  Hypertensive.  CXR without evidence of edema.  Blood will be sent for the blood differential.  Clinical Course as of 07/11/20 1955  Wed Jul 11, 2020  1935 Patient stills feels some mild shortness of breath but able to ambulate down hall in no distress.  BNP negative troponin negative.  Not consistent with ACS but given recent travel leg swelling will order CTA to further rule out PE.  Will trial nebulizer to see if she gets some relief from that if she is may have some  mild bronchitis but she is not hypoxic and otherwise well-appearing.  States that she is due for her  antihypertensive in the evening which would explain her mild hypertension.  Seems less consistent with acute heart failure.  Patient will be signed out to oncoming physician pending follow-up CTA and repeat troponin.  If both negative do feel should be appropriate for outpatient follow-up. [PR]    Clinical Course User Index [PR] Merlyn Lot, MD    The patient was evaluated in Emergency Department today for the symptoms described in the history of present illness. He/she was evaluated in the context of the global COVID-19 pandemic, which necessitated consideration that the patient might be at risk for infection with the SARS-CoV-2 virus that causes COVID-19. Institutional protocols and algorithms that pertain to the evaluation of patients at risk for COVID-19 are in a state of rapid change based on information released by regulatory bodies including the CDC and federal and state organizations. These policies and algorithms were followed during the patient's care in the ED.  As part of my medical decision making, I reviewed the following data within the [facility] notes reviewed and incorporated, Labs reviewed, notes from prior ED visits and [place] Controlled Substance Database   ____________________________________________   FINAL CLINICAL IMPRESSION(S) / ED DIAGNOSES  Final diagnoses:  Dyspnea, unspecified type      NEW MEDICATIONS STARTED DURING THIS VISIT:  New Prescriptions   ALBUTEROL (VENTOLIN HFA) 108 (90 BASE) MCG/ACT INHALER    Inhale 2 puffs into the lungs every 6 (six) hours as needed for wheezing or shortness of breath.     Note:  This document was prepared using Dragon voice recognition software and may include unintentional dictation errors.    Merlyn Lot, MD 07/11/20 Karl Bales

## 2020-07-11 NOTE — Telephone Encounter (Signed)
Noted, can we call to check on her tomorrow (04/28)?

## 2020-07-12 ENCOUNTER — Observation Stay (HOSPITAL_BASED_OUTPATIENT_CLINIC_OR_DEPARTMENT_OTHER)
Admit: 2020-07-12 | Discharge: 2020-07-12 | Disposition: A | Discharge status: Home / Self Care | Payer: Medicare Other | Attending: Internal Medicine | Admitting: Internal Medicine

## 2020-07-12 DIAGNOSIS — I10 Essential (primary) hypertension: Secondary | ICD-10-CM | POA: Diagnosis not present

## 2020-07-12 DIAGNOSIS — I2693 Single subsegmental pulmonary embolism without acute cor pulmonale: Secondary | ICD-10-CM | POA: Diagnosis not present

## 2020-07-12 DIAGNOSIS — I2609 Other pulmonary embolism with acute cor pulmonale: Secondary | ICD-10-CM

## 2020-07-12 LAB — CBC WITH DIFFERENTIAL/PLATELET
Abs Immature Granulocytes: 0.02 10*3/uL (ref 0.00–0.07)
Basophils Absolute: 0.1 10*3/uL (ref 0.0–0.1)
Basophils Relative: 1 %
Eosinophils Absolute: 0.3 10*3/uL (ref 0.0–0.5)
Eosinophils Relative: 3 %
HCT: 36.9 % (ref 36.0–46.0)
Hemoglobin: 12 g/dL (ref 12.0–15.0)
Immature Granulocytes: 0 %
Lymphocytes Relative: 41 %
Lymphs Abs: 3.9 10*3/uL (ref 0.7–4.0)
MCH: 27.6 pg (ref 26.0–34.0)
MCHC: 32.5 g/dL (ref 30.0–36.0)
MCV: 85 fL (ref 80.0–100.0)
Monocytes Absolute: 0.8 10*3/uL (ref 0.1–1.0)
Monocytes Relative: 8 %
Neutro Abs: 4.4 10*3/uL (ref 1.7–7.7)
Neutrophils Relative %: 47 %
Platelets: 308 10*3/uL (ref 150–400)
RBC: 4.34 MIL/uL (ref 3.87–5.11)
RDW: 14.2 % (ref 11.5–15.5)
WBC: 9.4 10*3/uL (ref 4.0–10.5)
nRBC: 0 % (ref 0.0–0.2)

## 2020-07-12 LAB — URINALYSIS, COMPLETE (UACMP) WITH MICROSCOPIC
Bacteria, UA: NONE SEEN
Bilirubin Urine: NEGATIVE
Glucose, UA: NEGATIVE mg/dL
Ketones, ur: NEGATIVE mg/dL
Nitrite: NEGATIVE
Protein, ur: 30 mg/dL — AB
Specific Gravity, Urine: 1.044 — ABNORMAL HIGH (ref 1.005–1.030)
pH: 6 (ref 5.0–8.0)

## 2020-07-12 LAB — TSH: TSH: 2.849 u[IU]/mL (ref 0.350–4.500)

## 2020-07-12 LAB — COMPREHENSIVE METABOLIC PANEL
ALT: 17 U/L (ref 0–44)
AST: 18 U/L (ref 15–41)
Albumin: 3.6 g/dL (ref 3.5–5.0)
Alkaline Phosphatase: 60 U/L (ref 38–126)
Anion gap: 10 (ref 5–15)
BUN: 10 mg/dL (ref 8–23)
CO2: 25 mmol/L (ref 22–32)
Calcium: 9.2 mg/dL (ref 8.9–10.3)
Chloride: 107 mmol/L (ref 98–111)
Creatinine, Ser: 0.59 mg/dL (ref 0.44–1.00)
GFR, Estimated: >60 mL/min (ref >60)
Glucose, Bld: 96 mg/dL (ref 70–99)
Potassium: 3.3 mmol/L — ABNORMAL LOW (ref 3.5–5.1)
Sodium: 142 mmol/L (ref 135–145)
Total Bilirubin: 0.8 mg/dL (ref 0.3–1.2)
Total Protein: 6.8 g/dL (ref 6.5–8.1)

## 2020-07-12 LAB — ECHOCARDIOGRAM COMPLETE
AR max vel: 2.1 cm2
AV Area VTI: 2.34 cm2
AV Area mean vel: 2.22 cm2
AV Mean grad: 10 mmHg
AV Peak grad: 19.2 mmHg
Ao pk vel: 2.19 m/s
Area-P 1/2: 2.75 cm2
Height: 65 in
MV VTI: 1.89 cm2
S' Lateral: 2.9 cm
Weight: 4465.64 oz

## 2020-07-12 LAB — HEPARIN LEVEL (UNFRACTIONATED): Heparin Unfractionated: 0.62 IU/mL (ref 0.30–0.70)

## 2020-07-12 LAB — PHOSPHORUS: Phosphorus: 3.4 mg/dL (ref 2.5–4.6)

## 2020-07-12 LAB — MAGNESIUM: Magnesium: 2 mg/dL (ref 1.7–2.4)

## 2020-07-12 MED ORDER — APIXABAN (ELIQUIS) VTE STARTER PACK (10MG AND 5MG): ORAL_TABLET | ORAL | 0 refills | Status: DC

## 2020-07-12 NOTE — Care Management Obs Status (Signed)
[facility] NOTIFICATION   Patient Details  Name: Paige Hester MRN: [id number] Date of Birth: September 01, 1955   Medicare Observation Status Notification Given:  No (admitted obs less than 24 hours)    Beverly Sessions, RN 07/12/2020, 9:14 AM

## 2020-07-12 NOTE — Consult Note (Signed)
[place] for IV Heparin Indication: pulmonary embolus  Patient Measurements: Heparin Dosing Weight: 87.3 kg  Labs: Recent Labs    07/11/20 1818 07/11/20 1952 07/11/20 2108 07/12/20 0504  HGB 12.4  --   --  12.0  HCT 37.3  --   --  36.9  PLT 304  --   --  308  APTT  --   --  26  --   LABPROT  --   --  13.2  --   INR  --   --  1.0  --   HEPARINUNFRC  --   --   --  0.62  CREATININE 0.81  --   --   --   TROPONINIHS 8 8  --   --     Estimated Creatinine Clearance: 92.7 mL/min (by C-G formula based on SCr of 0.81 mg/dL).   Medical History: Past Medical History:  Diagnosis Date  . Allergy   . Gallbladder polyp 2012  . GERD (gastroesophageal reflux disease)   . Hypertension   . Iron deficiency anemia   . Menopausal symptoms    since 2007  . Murmur, cardiac   . Vitamin D deficiency     Medications:  No anticoagulation prior to admission per my chart review  Assessment: Patient is a 65 y/o F with medical history as above who presented to the ED 4/27 with shortness of breath and leg edema. CTA findings of "isolated subsegmental pulmonary embolus in the left upper lobe". Bilateral leg ultrasound did not detect DVT. Pharmacy has been consulted to initiate heparin infusion for PE.  Baseline CBC within normal limits. Baseline aPTT and PT-INR pending.  Goal of Therapy:  Heparin level 0.3-0.7 units/ml Monitor platelets by anticoagulation protocol: Yes   Plan:  4/28:  HL @ 0504 = 0.62 Will continue this pt on current rate and recheck HL in 6 hrs on 4/28 @ 1100.   Keishaun Hazel D 07/12/2020,6:22 AM

## 2020-07-12 NOTE — Discharge Summary (Signed)
Paige Hester [id number] DOB: [birth year] DOA: 07/11/2020  PCP: Pleas Koch, NP  Admit date: 07/11/2020 Discharge date: 07/12/2020  Time spent: 45 minutes  Recommendations for Outpatient Follow-up:  1. PCP f/u 2. Will need to f/u results of echocardiogram which was performed prior to discharge but not yet read     Discharge Diagnoses:  Active Problems:   Essential hypertension   MURMUR, CARDIAC, UNDIAGNOSED   GERD (gastroesophageal reflux disease)   Obesity, unspecified   Postmenopausal bleeding   Pulmonary embolism ([place])   Cardiomegaly   Discharge Condition: stable  Diet recommendation: heart healthy  Filed Weights   07/11/20 1744 07/11/20 2249  Weight: 124.7 kg 126.6 kg    History of present illness:  Paige Hester is a 65 y.o. female with medical history significant of hypertension GERD, iron deficiency anemia, heart murmur  Presented with shortness of breath and leg swelling she recently traveled to [place] by car few days ago.  Was negative for COVID today no fevers or chills She feels maybe her shortness of breath been going on almost a month or so No chest pain, she had to stop walking every few steps  Quit smoking 1994 Occasional [place] Reports bilateral leg edema for at least few months with exertional shortness of breath has been getting worse lately initially attributed to gaining weight Patient reports she snores at night but have never had  sleep study She is current on mammograms and colonoscopies Taking Megace to help stop heavy bleeding No history of heart failure No family history of blood clots but she does have mother who died in 57s from coronary artery disease  Hospital Course:  Patient presents with a couple of months intermittent lower extremity swelling and dyspnea on exertion, worse the past several days after a long car drive to [place]. Here hemodynamically stable, no oxygen requirement, no increased WOB. CTA shows small  subsegmental PE w/o evidence right heart strain. Monitored overnight and stable. Cardiac exam shows moderate systolic murmur. BNP wnl. TTE obtained prior to discharge, results not available. Will advise to hold megace. Pt appears to be UTD with cancer screenings. No family or personal history of venous thrombus. Thus tentative plan for minimum of 3 months anticoagulation.  Procedures:  none   Consultations:  none  Discharge Exam: Vitals:   07/12/20 0435 07/12/20 0823  BP: (!) 158/67 133/79  Pulse: 65 71  Resp: 18 18  Temp: 98.7 F (37.1 C) 98 F (36.7 C)  SpO2: 99% 100%    General: NAD Cardiovascular: RRR, mod systolic murmur Respiratory: CTAB Extremities: trace LE pitting edema  Discharge Instructions   Discharge Instructions    Diet - low sodium heart healthy   Complete by: As directed    Increase activity slowly   Complete by: As directed      Allergies as of 07/12/2020   No Known Allergies     Medication List    STOP taking these medications   aspirin 81 MG tablet   ibuprofen 600 MG tablet Commonly known as: ADVIL   megestrol 40 MG tablet Commonly known as: MEGACE     TAKE these medications   albuterol 108 (90 Base) MCG/ACT inhaler Commonly known as: VENTOLIN HFA Inhale 2 puffs into the lungs every 6 (six) hours as needed for wheezing or shortness of breath.   Apixaban Starter Pack (10mg  and 5mg ) Commonly known as: ELIQUIS STARTER PACK Take as directed on package: start with two-5mg  tablets twice daily for 7 days. On  day 8, switch to one-5mg  tablet twice daily.   cholecalciferol 25 MCG (1000 UNIT) tablet Commonly known as: VITAMIN D Take 1,000 Units by mouth daily.   docusate sodium 100 MG capsule Commonly known as: COLACE Take 1 capsule (100 mg total) by mouth 2 (two) times daily as needed for mild constipation or moderate constipation.   fluticasone 50 MCG/ACT nasal spray Commonly known as: FLONASE Place 1 spray into both nostrils 2 (two)  times daily as needed for allergies or rhinitis.   hydrochlorothiazide 12.5 MG capsule Commonly known as: MICROZIDE Take 1 capsule (12.5 mg total) by mouth daily. For blood pressure.   losartan 50 MG tablet Commonly known as: COZAAR Take 1 tablet (50 mg total) by mouth daily. For blood pressure.   Melatonin 10 MG Tabs Take by mouth as needed.   omeprazole 20 MG capsule Commonly known as: PRILOSEC Take 20 mg by mouth daily.   Potassium 99 MG Tabs Take 1 tablet by mouth daily.   verapamil 360 MG 24 hr capsule Commonly known as: VERELAN PM Take 1 capsule (360 mg total) by mouth at bedtime.      No Known Allergies  Follow-up Information    Schedule an appointment as soon as possible for a visit  with Pleas Koch, NP.   Specialty: Internal Medicine Contact information: [place] [address] [phone]        Corey Skains, MD.   Specialty: Cardiology Contact information: [address] [facility] [place] [address] [phone]                The results of significant diagnostics from this hospitalization (including imaging, microbiology, ancillary and laboratory) are listed below for reference.    Significant Diagnostic Studies: DG Chest 2 View  Result Date: 07/11/2020 CLINICAL DATA:  Shortness of breath EXAM: CHEST - 2 VIEW COMPARISON:  None. FINDINGS: Lungs are clear. Heart is borderline enlarged with pulmonary vascularity normal. No adenopathy. There is aortic atherosclerosis. No bone lesions. IMPRESSION: Lungs clear. Borderline cardiac enlargement. Aortic Atherosclerosis (ICD10-I70.0). Electronically Signed   By: Lowella Grip III M.D.   On: 07/11/2020 18:42   CT Angio Chest PE W and/or Wo Contrast  Result Date: 07/11/2020 CLINICAL DATA:  PE suspected, high prob Shortness of breath.  Increasing leg edema. EXAM: CT ANGIOGRAPHY CHEST WITH CONTRAST TECHNIQUE: Multidetector CT imaging of the  chest was performed using the standard protocol during bolus administration of intravenous contrast. Multiplanar CT image reconstructions and MIPs were obtained to evaluate the vascular anatomy. CONTRAST:  161mL OMNIPAQUE IOHEXOL 350 MG/ML SOLN COMPARISON:  Radiograph earlier today. FINDINGS: Cardiovascular: Isolated subsegmental pulmonary embolus in the left upper lobe, series 4, image 23. No filling defects in the right pulmonary arteries. Prominence of the main pulmonary artery at 3.5 cm. Aortic atherosclerosis. Mild cardiomegaly with coronary artery calcifications. Mitral annulus calcifications. No pericardial effusion. Mediastinum/Nodes: Prominent right upper paratracheal node measures 10 mm. No hilar adenopathy. No thyroid nodule. Decompressed esophagus. Lungs/Pleura: No focal airspace disease or pulmonary infarct. No pleural fluid. Mild heterogeneous pulmonary parenchyma. Trachea and central bronchi are patent. Upper Abdomen: Cyst in the right hepatic lobe is partially included. Gallstones. No acute upper abdominal finding Musculoskeletal: Mild scoliosis. There are no acute or suspicious osseous abnormalities. Review of the MIP images confirms the above findings. IMPRESSION: 1. Positive for isolated subsegmental pulmonary embolus in the left upper lobe. 2. Prominence of the main pulmonary artery suggesting pulmonary arterial hypertension. 3. Cardiomegaly  with coronary artery calcifications. 4. Mild heterogeneous pulmonary parenchyma, can be seen with small airways disease. 5. Incidental note of gallstones. Aortic Atherosclerosis (ICD10-I70.0). Critical Value/emergent results were called by telephone at the time of interpretation on 07/11/2020 at 8:35 pm to Dr Tamala Julian, who verbally acknowledged these results. Electronically Signed   By: Keith Rake M.D.   On: 07/11/2020 20:35   US Venous Img Lower Bilateral  Result Date: 07/11/2020 CLINICAL DATA:  Leg swelling after recent travel. Shortness of breath for  3 months. Pain, edema, spider veins, anticoagulation therapy. EXAM: Bilateral LOWER EXTREMITY VENOUS DOPPLER ULTRASOUND TECHNIQUE: Gray-scale sonography with compression, as well as color and duplex ultrasound, were performed to evaluate the deep venous system(s) from the level of the common femoral vein through the popliteal and proximal calf veins. COMPARISON:  None. FINDINGS: VENOUS Normal compressibility of the common femoral, superficial femoral, and popliteal veins, as well as the visualized calf veins. Visualized portions of profunda femoral vein and great saphenous vein unremarkable. No filling defects to suggest DVT on grayscale or color Doppler imaging. Doppler waveforms show normal direction of venous flow, normal respiratory plasticity and response to augmentation. Limited views of the contralateral common femoral vein are unremarkable. OTHER None. Limitations: none IMPRESSION: No evidence of acute deep venous thrombosis in the visualized lower extremity veins. Electronically Signed   By: Lucienne Capers M.D.   On: 07/11/2020 19:36    Microbiology: Recent Results (from the past 240 hour(s))  Resp Panel by RT-PCR (Flu A&B, Covid) Nasopharyngeal Swab     Status: None   Collection Time: 07/11/20  8:43 PM   Specimen: Nasopharyngeal Swab; Nasopharyngeal(NP) swabs in vial transport medium  Result Value Ref Range Status   SARS Coronavirus 2 by RT PCR NEGATIVE NEGATIVE Final    Comment: (NOTE) SARS-CoV-2 target nucleic acids are NOT DETECTED.  The SARS-CoV-2 RNA is generally detectable in upper respiratory specimens during the acute phase of infection. The lowest concentration of SARS-CoV-2 viral copies this assay can detect is 138 copies/mL. A negative result does not preclude SARS-Cov-2 infection and should not be used as the sole basis for treatment or other patient management decisions. A negative result may occur with  improper specimen collection/handling, submission of specimen  other than nasopharyngeal swab, presence of viral mutation(s) within the areas targeted by this assay, and inadequate number of viral copies(<138 copies/mL). A negative result must be combined with clinical observations, patient history, and epidemiological information. The expected result is Negative.  Fact Sheet for Patients:  EntrepreneurPulse.com.au  Fact Sheet for Healthcare Providers:  IncredibleEmployment.be  This test is no t yet approved or cleared by the [place] FDA and  has been authorized for detection and/or diagnosis of SARS-CoV-2 by FDA under an Emergency Use Authorization (EUA). This EUA will remain  in effect (meaning this test can be used) for the duration of the COVID-19 declaration under Section 564(b)(1) of the Act, 21 U.S.C.section 360bbb-3(b)(1), unless the authorization is terminated  or revoked sooner.       Influenza A by PCR NEGATIVE NEGATIVE Final   Influenza B by PCR NEGATIVE NEGATIVE Final    Comment: (NOTE) The Xpert Xpress SARS-CoV-2/FLU/RSV plus assay is intended as an aid in the diagnosis of influenza from Nasopharyngeal swab specimens and should not be used as a sole basis for treatment. Nasal washings and aspirates are unacceptable for Xpert Xpress SARS-CoV-2/FLU/RSV testing.  Fact Sheet for Patients: EntrepreneurPulse.com.au  Fact Sheet for Healthcare Providers: IncredibleEmployment.be  This test is not yet  approved or cleared by the [place] and has been authorized for detection and/or diagnosis of SARS-CoV-2 by FDA under an Emergency Use Authorization (EUA). This EUA will remain in effect (meaning this test can be used) for the duration of the COVID-19 declaration under Section 564(b)(1) of the Act, 21 U.S.C. section 360bbb-3(b)(1), unless the authorization is terminated or revoked.  Performed at [facility], [address]  [place], [place] [address]      Labs: Basic Metabolic Panel: Recent Labs  Lab 07/11/20 1818 07/11/20 1952 07/12/20 0504  NA 141  --  142  K 3.3*  --  3.3*  CL 105  --  107  CO2 28  --  25  GLUCOSE 102*  --  96  BUN 13  --  10  CREATININE 0.81  --  0.59  CALCIUM 9.6  --  9.2  MG  --  2.0 2.0  PHOS  --   --  3.4   Liver Function Tests: Recent Labs  Lab 07/11/20 1818 07/12/20 0504  AST 18 18  ALT 18 17  ALKPHOS 56 60  BILITOT 0.5 0.8  PROT 7.0 6.8  ALBUMIN 3.5 3.6   No results for input(s): LIPASE, AMYLASE in the last 168 hours. No results for input(s): AMMONIA in the last 168 hours. CBC: Recent Labs  Lab 07/11/20 1818 07/12/20 0504  WBC 7.3 9.4  NEUTROABS  --  4.4  HGB 12.4 12.0  HCT 37.3 36.9  MCV 84.0 85.0  PLT 304 308   Cardiac Enzymes: No results for input(s): CKTOTAL, CKMB, CKMBINDEX, TROPONINI in the last 168 hours. BNP: BNP (last 3 results) Recent Labs    07/11/20 1752  BNP 53.2    ProBNP (last 3 results) No results for input(s): PROBNP in the last 8760 hours.  CBG: No results for input(s): GLUCAP in the last 168 hours.     Signed:  Desma Maxim MD.  [facility] 07/12/2020, 8:29 AM

## 2020-07-12 NOTE — Discharge Instructions (Signed)
Pulmonary Embolism  A pulmonary embolism (PE) is a sudden blockage or decrease of blood flow in one or both lungs that happens when a clot travels into the arteries of the lung (pulmonary arteries). Most blockages come from a blood clot that forms in the vein of a leg or arm (deep vein thrombosis, DVT) and travels to the lungs. A clot is blood that has thickened into a gel or solid. PE is a dangerous and life-threatening condition that needs to be treated right away. What are the causes? This condition is usually caused by a blood clot that forms in a vein and moves to the lungs. In rare cases, it may be caused by air, fat, part of a tumor, or other tissue that moves through the veins and into the lungs. What increases the risk? The following factors may make you more likely to develop this condition:  Experiencing a traumatic injury, such as breaking a hip or leg.  Having: ? A spinal cord injury. ? Major surgery, especially hip or knee replacement, or surgery on parts of the nervous system or on the abdomen. ? A stroke. ? A blood clotting disease. ? Long-term (chronic) lung or heart disease. ? Cancer, especially if you are being treated with chemotherapy. ? A central venous catheter.  Taking medicines that contain estrogen. These include birth control pills and hormone replacement therapy.  Being: ? Pregnant. ? In the period of time after your baby is delivered (postpartum). ? Older than age 60. ? Overweight. ? A smoker, especially if you have other risks. ? Not very active (sedentary), not being able to move at all, or spending long periods sitting, such as travel over 6 hours. You are also at a greater risk if you have a leg in a cast or splint. What are the signs or symptoms? Symptoms of this condition usually start suddenly and include:  Shortness of breath during activity or at rest.  Coughing, coughing up blood, or coughing up bloody mucus.  Chest pain, back pain, or  shoulder blade pain that gets worse with deep breaths.  Rapid or irregular heartbeat.  Feeling light-headed or dizzy, or fainting.  Feeling anxious.  Pain and swelling in a leg. This is a symptom of DVT, which can lead to PE. How is this diagnosed? This condition may be diagnosed based on your medical history, a physical exam, and tests. Tests may include:  Blood tests.  An ECG (electrocardiogram) of the heart.  A CT pulmonary angiogram. This test checks blood flow in and around your lungs.  A ventilation-perfusion scan, also called a lung VQ scan. This test measures air flow and blood flow to the lungs.  An ultrasound to check for a DVT. How is this treated? Treatment for this condition depends on many factors, such as the cause of your PE, your risk for bleeding or developing more clots, and other medical conditions you may have. Treatment aims to stop blood clots from forming or growing larger. In some cases, treatment may be aimed at breaking apart or removing the blood clot. Treatment may include:  Medicines, such as: ? Blood thinning medicines, also called anticoagulants, to stop clots from forming and growing. ? Medicines that break apart clots (thrombolytics).  Procedures, such as: ? Using a flexible tube to remove a blood clot (embolectomy) or to deliver medicine to destroy it (catheter-directed thrombolysis). ? Surgery to remove the clot (surgical embolectomy). This is rare. You may need a combination of immediate, long-term, and extended   treatments. Your treatment may continue for several months (maintenance therapy) or longer depending on your medical conditions. You and your health care provider will work together to choose the treatment program that is best for you. Follow these instructions at home: Medicines  Take over-the-counter and prescription medicines only as told by your health care provider.  If you are taking blood thinners: ? Talk with your health care  provider before you take any medicines that contain aspirin or NSAIDs, such as ibuprofen. These medicines increase your risk for dangerous bleeding. ? Take your medicine exactly as told, at the same time every day. ? Avoid activities that could cause injury or bruising, and follow instructions about how to prevent falls. ? Wear a medical alert bracelet or carry a card that lists what medicines you take.  Understand what foods and drugs interact with any medicines that you are taking. General instructions  Ask your health care provider when you may return to your normal activities. Avoid sitting or lying for a long time without moving.  Maintain a healthy weight. Ask your health care provider what weight is healthy for you.  Do not use any products that contain nicotine or tobacco, such as cigarettes, e-cigarettes, and chewing tobacco. If you need help quitting, ask your health care provider.  Talk with your health care provider about any travel plans. It is important to make sure that you are still able to take your medicine while traveling.  Keep all follow-up visits as told by your health care provider. This is important. Where to find more information  American Lung Association: www.lung.org  Centers for Disease Control and Prevention: www.cdc.gov Contact a health care provider if:  You missed a dose of your blood thinner medicine. Get help right away if you:  Have: ? New or increased pain, swelling, warmth, or redness in an arm or leg. ? Shortness of breath that gets worse during activity or at rest. ? A fever. ? Worsening chest pain. ? A rapid or irregular heartbeat. ? A severe headache. ? Vision changes. ? A serious fall or accident, or you hit your head. ? Stomach pain. ? Blood in your vomit, stool, or urine. ? A cut that will not stop bleeding.  Cough up blood.  Feel light-headed or dizzy, and that feeling does not go away.  Cannot move your arms or legs.  Are  confused or have memory loss. These symptoms may represent a serious problem that is an emergency. Do not wait to see if the symptoms will go away. Get medical help right away. Call your local emergency services (911 in the [place] Do not drive yourself to the hospital. Summary  A pulmonary embolism (PE) is a serious and potentially life-threatening condition, in which a blood clot from one part of the body (deep vein thrombosis, DVT) travels to the arteries of the lung, causing a sudden blockage or decrease of blood flow to the lungs. This may result in shortness of breath, chest pain, dizziness, and fainting.  Treatments for this condition usually include medicines to thin your blood (anticoagulants) or medicines to break apart blood clots (thrombolytics).  If you are given blood thinners, take your medicine exactly as told by your health care provider, at the same time every day. This is important.  Understand what foods and drugs interact with any medicines that you are taking.  If you have signs of PE or DVT, call your local emergency services (911 in the [place] This information is not   intended to replace advice given to you by your health care provider. Make sure you discuss any questions you have with your health care provider. Document Revised: 01/14/2019 Document Reviewed: 01/14/2019 Elsevier Patient Education  2021 Elsevier Inc.  

## 2020-07-12 NOTE — TOC Progression Note (Incomplete)
ANTICOAGULATION  NOTE  Pharmacy Consult for apixaban Indication: pulmonary embolus  Patient Measurements: Heparin Dosing Weight: 87.3 kg  Labs: Recent Labs    07/11/20 1818 07/11/20 1952 07/11/20 2108 07/12/20 0504  HGB 12.4  --   --  12.0  HCT 37.3  --   --  36.9  PLT 304  --   --  308  APTT  --   --  26  --   LABPROT  --   --  13.2  --   INR  --   --  1.0  --   HEPARINUNFRC  --   --   --  0.62  CREATININE 0.81  --   --  0.59  TROPONINIHS 8 8  --   --     Estimated Creatinine Clearance: 93.9 mL/min (by C-G formula based on SCr of 0.59 mg/dL).   Medical History: Past Medical History:  Diagnosis Date  . Allergy   . Gallbladder polyp 2012  . GERD (gastroesophageal reflux disease)   . Hypertension   . Iron deficiency anemia   . Menopausal symptoms    since 2007  . Murmur, cardiac   . Vitamin D deficiency     Medications:  No anticoagulation prior to admission per my chart review  Assessment: Patient is a 65 y/o F with medical history as above who presented to the ED 4/27 with shortness of breath and leg edema. CTA findings of "isolated subsegmental pulmonary embolus in the left upper lobe". Bilateral leg ultrasound did not detect DVT. Pharmacy has been consulted to initiate heparin infusion for PE.  Baseline CBC within normal limits. Baseline aPTT and PT-INR pending.  Goal of Therapy:  Heparin level 0.3-0.7 units/ml Monitor platelets by anticoagulation protocol: Yes   Plan:  4/28:  HL @ 0504 = 0.62 Will continue this pt on current rate and recheck HL in 6 hrs on 4/28 @ 1100.   Dallie Piles 07/12/2020,7:06 AM

## 2020-07-12 NOTE — Progress Notes (Signed)
   07/12/20 0700  Clinical Encounter Type  Visited With Patient  Visit Type Initial  Referral From Nurse  Consult/Referral To Chaplain  Spiritual Encounters  Spiritual Needs Prayer;Emotional  Chaplain Sheron Robin responded to an OR in room 2C-221A Pt, Paige Hester. Pt stated she do not know what is going on the doctors are checking her out. I provided emotional and spiritual support by using reflective listening and prayer was given at the end of the visit.

## 2020-07-12 NOTE — TOC Initial Note (Signed)
Transition of Care [facility]) - Initial/Assessment Note    Patient Details  Name: Paige Hester MRN: [id number] Date of Birth: 22-Feb-1956  Transition [facility]) CM/SW Contact:    Beverly Sessions, RN Phone Number: 07/12/2020, 10:32 AM  Clinical Narrative:                    [facility] consult for direct oral anticoagulant Pharmacist has provided patient with 30 day free coupon Benefit check submitted PCP [place].   Per nursing staff patient is independent and will not require any additional TOC needs     Patient Goals and CMS Choice        Expected Discharge Plan and Services           Expected Discharge Date: 07/12/20                                    Prior Living Arrangements/Services                       Activities of Daily Living Home Assistive Devices/Equipment: Eyeglasses ADL Screening (condition at time of admission) Patient's cognitive ability adequate to safely complete daily activities?: Yes Is the patient deaf or have difficulty hearing?: No Does the patient have difficulty seeing, even when wearing glasses/contacts?: No Does the patient have difficulty concentrating, remembering, or making decisions?: No Patient able to express need for assistance with ADLs?: Yes Does the patient have difficulty dressing or bathing?: No Independently performs ADLs?: Yes (appropriate for developmental age) Does the patient have difficulty walking or climbing stairs?: No Weakness of Legs: None Weakness of Arms/Hands: None  Permission Sought/Granted                  Emotional Assessment              Admission diagnosis:  Hypokalemia [E87.6] Pulmonary embolism (HCC) [I26.99] Dyspnea, unspecified type [R06.00] Single subsegmental pulmonary embolism without acute cor pulmonale ([place]) [I26.93] Patient Active Problem List   Diagnosis Date Noted  . Pulmonary embolism ([place]) 07/11/2020  . Cardiomegaly 07/11/2020  .  Endometrial thickness of 18 mm and associated bleeding in postmenopausal patient 07/27/2019  . Mild dysplasia of cervix (CIN I) 10/12/2018  . Postmenopausal bleeding 08/16/2018  . Prediabetes 11/04/2016  . BPPV (benign paroxysmal positional vertigo) 12/01/2014  . Preventative health care 08/30/2014  . Obesity, unspecified 08/10/2013  . GERD (gastroesophageal reflux disease) 05/31/2012  . Ulnar tunnel syndrome 07/02/2010  . MURMUR, CARDIAC, UNDIAGNOSED 09/20/2008  . URI (upper respiratory infection) 02/05/2008  . ECZEMA 02/16/2007  . Essential hypertension 12/19/2006   PCP:  Pleas Koch, NP Pharmacy:   [facility] [address] [place] - [place] [address] [place] [address] Phone: [phone] Fax: [place] ([place]), [place] - [place]) [place] [address] Phone: [phone] Fax: [phone]     Social Determinants of Health (SDOH) Interventions    Readmission Risk Interventions No flowsheet data found.

## 2020-07-12 NOTE — Plan of Care (Signed)
  Problem: Clinical Measurements: Goal: Diagnostic test results will improve Outcome: Progressing Goal: Respiratory complications will improve Outcome: Progressing Goal: Cardiovascular complication will be avoided Outcome: Progressing   

## 2020-07-12 NOTE — Progress Notes (Signed)
   07/12/20 0754  Clinical Encounter Type  Visited With Patient  Visit Type Follow-up  Referral From Nurse  Consult/Referral To Chaplain  Spiritual Encounters  Spiritual Needs Brochure  Stress Factors  Patient Stress Factors Health changes  Chaplain Musab Wingard visited Mrs. Brigandi again per an OR for and AD. Mrs.I did the education for one AD. Mrs. Schifano stated she do wish to do an AD at this present time but she will take the pamphlet.

## 2020-07-12 NOTE — Progress Notes (Signed)
*  PRELIMINARY RESULTS* Echocardiogram 2D Echocardiogram has been performed.  Paige Hester Paige Hester 07/12/2020, 9:31 AM

## 2020-07-13 ENCOUNTER — Ambulatory Visit: Payer: Federal, State, Local not specified - PPO | Admitting: Internal Medicine

## 2020-07-19 ENCOUNTER — Other Ambulatory Visit: Payer: Self-pay

## 2020-07-19 ENCOUNTER — Ambulatory Visit (INDEPENDENT_AMBULATORY_CARE_PROVIDER_SITE_OTHER): Payer: Medicare Other | Admitting: Family Medicine

## 2020-07-19 ENCOUNTER — Encounter: Payer: Self-pay | Admitting: Family Medicine

## 2020-07-19 VITALS — BP 140/80 | HR 72 | Temp 95.5°F | Ht 65.0 in | Wt 274.5 lb

## 2020-07-19 DIAGNOSIS — I2699 Other pulmonary embolism without acute cor pulmonale: Secondary | ICD-10-CM | POA: Diagnosis not present

## 2020-07-19 DIAGNOSIS — I10 Essential (primary) hypertension: Secondary | ICD-10-CM | POA: Diagnosis not present

## 2020-07-19 DIAGNOSIS — I517 Cardiomegaly: Secondary | ICD-10-CM

## 2020-07-19 DIAGNOSIS — R011 Cardiac murmur, unspecified: Secondary | ICD-10-CM

## 2020-07-19 DIAGNOSIS — I272 Pulmonary hypertension, unspecified: Secondary | ICD-10-CM | POA: Diagnosis not present

## 2020-07-19 DIAGNOSIS — E785 Hyperlipidemia, unspecified: Secondary | ICD-10-CM | POA: Insufficient documentation

## 2020-07-19 DIAGNOSIS — E7800 Pure hypercholesterolemia, unspecified: Secondary | ICD-10-CM

## 2020-07-19 DIAGNOSIS — I700 Atherosclerosis of aorta: Secondary | ICD-10-CM | POA: Insufficient documentation

## 2020-07-19 NOTE — Progress Notes (Signed)
Subjective:    Patient ID: Paige Hester, female    DOB: [birth year], 65 y.o.   MRN: [id number]  This visit occurred during the SARS-CoV-2 public health emergency.  Safety protocols were in place, including screening questions prior to the visit, additional usage of staff PPE, and extensive cleaning of exam room while observing appropriate contact time as indicated for disinfecting solutions.    HPI 65 yo pt of [facility] presents for f/u of hospitalization from 4/27 to 07/12/20 for sob and let swelling   Wt Readings from Last 3 Encounters:  07/19/20 274 lb 8 oz (124.5 kg)  07/11/20 279 lb 1.6 oz (126.6 kg)  01/19/20 268 lb 2 oz (121.6 kg)   45.68 kg/m   She presented with those symptoms after recently returning from [facility]  covid negative  Dx with small subsegmental PE w/o evidence of R heart strain  New systolic M heard on exam  Minimum of 3 mo of anticoag recommended for provoked PE  Lab Results  Component Value Date   WBC 9.4 07/12/2020   HGB 12.0 07/12/2020   HCT 36.9 07/12/2020   MCV 85.0 07/12/2020   PLT 308 07/12/2020   Lab Results  Component Value Date   CREATININE 0.59 07/12/2020   BUN 10 07/12/2020   NA 142 07/12/2020   K 3.3 (L) 07/12/2020   CL 107 07/12/2020   CO2 25 07/12/2020  she takes her K daily    DG Chest 2 View  Result Date: 07/11/2020 CLINICAL DATA:  Shortness of breath EXAM: CHEST - 2 VIEW COMPARISON:  None. FINDINGS: Lungs are clear. Heart is borderline enlarged with pulmonary vascularity normal. No adenopathy. There is aortic atherosclerosis. No bone lesions. IMPRESSION: Lungs clear. Borderline cardiac enlargement. Aortic Atherosclerosis (ICD10-I70.0). Electronically Signed   By: Lowella Grip III M.D.   On: 07/11/2020 18:42   CT Angio Chest PE W and/or Wo Contrast  Result Date: 07/11/2020 CLINICAL DATA:  PE suspected, high prob Shortness of breath.  Increasing leg edema. EXAM: CT ANGIOGRAPHY CHEST WITH CONTRAST TECHNIQUE: Multidetector  CT imaging of the chest was performed using the standard protocol during bolus administration of intravenous contrast. Multiplanar CT image reconstructions and MIPs were obtained to evaluate the vascular anatomy. CONTRAST:  150mL OMNIPAQUE IOHEXOL 350 MG/ML SOLN COMPARISON:  Radiograph earlier today. FINDINGS: Cardiovascular: Isolated subsegmental pulmonary embolus in the left upper lobe, series 4, image 23. No filling defects in the right pulmonary arteries. Prominence of the main pulmonary artery at 3.5 cm. Aortic atherosclerosis. Mild cardiomegaly with coronary artery calcifications. Mitral annulus calcifications. No pericardial effusion. Mediastinum/Nodes: Prominent right upper paratracheal node measures 10 mm. No hilar adenopathy. No thyroid nodule. Decompressed esophagus. Lungs/Pleura: No focal airspace disease or pulmonary infarct. No pleural fluid. Mild heterogeneous pulmonary parenchyma. Trachea and central bronchi are patent. Upper Abdomen: Cyst in the right hepatic lobe is partially included. Gallstones. No acute upper abdominal finding Musculoskeletal: Mild scoliosis. There are no acute or suspicious osseous abnormalities. Review of the MIP images confirms the above findings. IMPRESSION: 1. Positive for isolated subsegmental pulmonary embolus in the left upper lobe. 2. Prominence of the main pulmonary artery suggesting pulmonary arterial hypertension. 3. Cardiomegaly with coronary artery calcifications. 4. Mild heterogeneous pulmonary parenchyma, can be seen with small airways disease. 5. Incidental note of gallstones. Aortic Atherosclerosis (ICD10-I70.0). Critical Value/emergent results were called by telephone at the time of interpretation on 07/11/2020 at 8:35 pm to Dr Tamala Julian, who verbally acknowledged these results. Electronically Signed   By: Threasa Beards  [place] M.D.   On: 07/11/2020 20:35   US Venous Img Lower Bilateral  Result Date: 07/11/2020 CLINICAL DATA:  Leg swelling after recent travel.  Shortness of breath for 3 months. Pain, edema, spider veins, anticoagulation therapy. EXAM: Bilateral LOWER EXTREMITY VENOUS DOPPLER ULTRASOUND TECHNIQUE: Gray-scale sonography with compression, as well as color and duplex ultrasound, were performed to evaluate the deep venous system(s) from the level of the common femoral vein through the popliteal and proximal calf veins. COMPARISON:  None. FINDINGS: VENOUS Normal compressibility of the common femoral, superficial femoral, and popliteal veins, as well as the visualized calf veins. Visualized portions of profunda femoral vein and great saphenous vein unremarkable. No filling defects to suggest DVT on grayscale or color Doppler imaging. Doppler waveforms show normal direction of venous flow, normal respiratory plasticity and response to augmentation. Limited views of the contralateral common femoral vein are unremarkable. OTHER None. Limitations: none IMPRESSION: No evidence of acute deep venous thrombosis in the visualized lower extremity veins. Electronically Signed   By: Lucienne Capers M.D.   On: 07/11/2020 19:36   ECHOCARDIOGRAM COMPLETE  Result Date: 07/12/2020    ECHOCARDIOGRAM REPORT   Patient Name:   Paige Hester [facility] Date of Exam: 07/12/2020 Medical Rec #:  [id number]           Height:       65.0 in Accession #:    [id number]          Weight:       279.1 lb Date of Birth:  1955-09-11           BSA:          2.279 m Patient Age:    36 years            BP:           158/67 mmHg Patient Gender: F                   HR:           67 bpm. Exam Location:  [facility] Procedure: 2D Echo, Color Doppler and Cardiac Doppler Indications:     I26.09 Pulmonary Embolus  History:         Patient has prior history of Echocardiogram examinations.                  Signs/Symptoms:Murmur; Risk Factors:Hypertension.  Sonographer:     Charmayne Sheer RDCS (AE) Referring Phys:  Geryl Rankins DOUTOVA Diagnosing Phys: Ida Rogue MD  Sonographer Comments: Suboptimal parasternal  window and suboptimal subcostal window. Image acquisition challenging due to patient body habitus. Global longitudinal strain was attempted. IMPRESSIONS  1. Left ventricular ejection fraction, by estimation, is 60 to 65%. The left ventricle has normal function. The left ventricle has no regional wall motion abnormalities. Left ventricular diastolic parameters are consistent with Grade II diastolic dysfunction (pseudonormalization). The average left ventricular global longitudinal strain is -15.5 %.  2. Right ventricular systolic function is normal. The right ventricular size is normal.  3. Left atrial size was mildly dilated. FINDINGS  Left Ventricle: Left ventricular ejection fraction, by estimation, is 60 to 65%. The left ventricle has normal function. The left ventricle has no regional wall motion abnormalities. The average left ventricular global longitudinal strain is -15.5 %. The left ventricular internal cavity size was normal in size. There is no left ventricular hypertrophy. Left ventricular diastolic parameters are consistent with Grade II diastolic dysfunction (pseudonormalization). Right Ventricle: The right ventricular size is normal. No  increase in right ventricular wall thickness. Right ventricular systolic function is normal. Left Atrium: Left atrial size was mildly dilated. Right Atrium: Right atrial size was normal in size. Pericardium: There is no evidence of pericardial effusion. Mitral Valve: The mitral valve is normal in structure. Mild mitral annular calcification. No evidence of mitral valve regurgitation. No evidence of mitral valve stenosis. MV peak gradient, 10.0 mmHg. The mean mitral valve gradient is 5.0 mmHg. Tricuspid Valve: The tricuspid valve is normal in structure. Tricuspid valve regurgitation is not demonstrated. No evidence of tricuspid stenosis. Aortic Valve: The aortic valve is normal in structure. Aortic valve regurgitation is not visualized. No aortic stenosis is present.  Aortic valve mean gradient measures 10.0 mmHg. Aortic valve peak gradient measures 19.2 mmHg. Aortic valve area, by VTI measures 2.34 cm. Pulmonic Valve: The pulmonic valve was normal in structure. Pulmonic valve regurgitation is not visualized. No evidence of pulmonic stenosis. Aorta: The aortic root is normal in size and structure. Venous: The inferior vena cava is normal in size with greater than 50% respiratory variability, suggesting right atrial pressure of 3 mmHg. IAS/Shunts: No atrial level shunt detected by color flow Doppler.  LEFT VENTRICLE PLAX 2D LVIDd:         4.70 cm  Diastology LVIDs:         2.90 cm  LV e' medial:    7.83 cm/s LV PW:         1.10 cm  LV E/e' medial:  21.5 LV IVS:        1.10 cm  LV e' lateral:   5.66 cm/s LVOT diam:     1.90 cm  LV E/e' lateral: 29.7 LV SV:         101 LV SV Index:   44       2D Longitudinal Strain LVOT Area:     2.84 cm 2D Strain GLS Avg:     -15.5 %  RIGHT VENTRICLE RV Basal diam:  4.00 cm RV Mid diam:    3.00 cm LEFT ATRIUM             Index       RIGHT ATRIUM           Index LA diam:        4.20 cm 1.84 cm/m  RA Area:     18.50 cm LA Vol (A2C):   50.4 ml 22.11 ml/m RA Volume:   49.20 ml  21.59 ml/m LA Vol (A4C):   55.1 ml 24.18 ml/m LA Biplane Vol: 56.2 ml 24.66 ml/m  AORTIC VALVE                    PULMONIC VALVE AV Area (Vmax):    2.10 cm     PV Vmax:       1.25 m/s AV Area (Vmean):   2.22 cm     PV Vmean:      82.000 cm/s AV Area (VTI):     2.34 cm     PV VTI:        0.209 m AV Vmax:           219.00 cm/s  PV Peak grad:  6.2 mmHg AV Vmean:          144.000 cm/s PV Mean grad:  3.0 mmHg AV VTI:            0.430 m AV Peak Grad:      19.2 mmHg AV Mean Grad:  10.0 mmHg LVOT Vmax:         162.00 cm/s LVOT Vmean:        113.000 cm/s LVOT VTI:          0.355 m LVOT/AV VTI ratio: 0.83  AORTA Ao Root diam: 2.70 cm MITRAL VALVE MV Area (PHT): 2.75 cm     SHUNTS MV Area VTI:   1.89 cm     Systemic VTI:  0.36 m MV Peak grad:  10.0 mmHg    Systemic Diam:  1.90 cm MV Mean grad:  5.0 mmHg MV Vmax:       1.58 m/s MV Vmean:      109.0 cm/s MV Decel Time: 276 msec MV E velocity: 168.00 cm/s MV A velocity: 166.00 cm/s MV E/A ratio:  1.01 Ida Rogue MD Electronically signed by Ida Rogue MD Signature Date/Time: 07/12/2020/10:10:07 AM    Final    BP Readings from Last 3 Encounters:  07/19/20 140/80  07/12/20 133/79  11/18/19 138/70   Pulse Readings from Last 3 Encounters:  07/19/20 72  07/12/20 71  11/18/19 63   Pulse ox is 97%  Feels ok  Gets just a little sob  Her leg swelling is much better/ no pain  One sore spot on L lateral lower leg - from old injury   Taking eliquis   Was supposed to see cardiologist on the 3rd - gave her the wrong appt time  Re scheduled for the 18th  That was with [facility], [facility]   The 10-year ASCVD risk score Mikey Bussing DC Jr., et al., 2013) is: 9.7%   Values used to calculate the score:     Age: 66 years     Sex: Female     Is Non-Hispanic African American: Yes     Diabetic: No     Tobacco smoker: No     Systolic Blood Pressure: [id number] mmHg     Is BP treated: Yes     HDL Cholesterol: 42.1 mg/dL     Total Cholesterol: 158 mg/dL   Cholesterol Lab Results  Component Value Date   CHOL 158 11/18/2019   HDL 42.10 11/18/2019   LDLCALC 105 (H) 11/18/2019   TRIG 52.0 11/18/2019   CHOLHDL 4 11/18/2019   Diet -is not great  Loves fried food   Emotional eating is a problem  Wants to start walking when sob is better  Wants to loose weight   Prediabetes Lab Results  Component Value Date   HGBA1C 6.0 11/18/2019   Patient Active Problem List   Diagnosis Date Noted  . Pulmonary hypertension ([place]) 07/19/2020  . Aortic atherosclerosis ([place]) 07/19/2020  . Hyperlipidemia 07/19/2020  . Pulmonary embolism ([place]) 07/11/2020  . Cardiomegaly 07/11/2020  . Endometrial thickness of 18 mm and associated bleeding in postmenopausal patient 07/27/2019  . Mild dysplasia of cervix (CIN I) 10/12/2018   . Postmenopausal bleeding 08/16/2018  . Prediabetes 11/04/2016  . BPPV (benign paroxysmal positional vertigo) 12/01/2014  . Preventative health care 08/30/2014  . Morbid obesity ([place]) 08/10/2013  . GERD (gastroesophageal reflux disease) 05/31/2012  . Ulnar tunnel syndrome 07/02/2010  . MURMUR, CARDIAC, UNDIAGNOSED 09/20/2008  . URI (upper respiratory infection) 02/05/2008  . ECZEMA 02/16/2007  . Essential hypertension 12/19/2006   Past Medical History:  Diagnosis Date  . Allergy   . Gallbladder polyp 2012  . GERD (gastroesophageal reflux disease)   . Hypertension   . Iron deficiency anemia   . Menopausal symptoms  since 2007  . Murmur, cardiac   . Vitamin D deficiency    Past Surgical History:  Procedure Laterality Date  . COLONOSCOPY  2010   2020  . HYSTEROSCOPY WITH D & C N/A 07/27/2019   Procedure: DILATATION AND CURETTAGE /HYSTEROSCOPY, Polypectomy;  Surgeon: Osborne Oman, MD;  Location: [facility];  Service: Gynecology;  Laterality: N/A;   Social History   Tobacco Use  . Smoking status: Former Smoker    Quit date: 1999    Years since quitting: 23.3  . Smokeless tobacco: Never Used  Substance Use Topics  . Alcohol use: Yes    Alcohol/week: 7.0 standard drinks    Types: 7 Glasses of wine per week  . Drug use: No   Family History  Problem Relation Age of Onset  . Heart disease Mother   . Hypertension Mother   . Heart attack Mother 48  . Prostate cancer Father   . Heart disease Father 48       CAD, STENT  . Heart disease Brother        Myocardial infarction  . Colon cancer Maternal Aunt   . Coronary artery disease Sister   . Colon polyps Neg Hx   . Esophageal cancer Neg Hx   . Rectal cancer Neg Hx   . Stomach cancer Neg Hx    No Known Allergies Current Outpatient Medications on File Prior to Visit  Medication Sig Dispense Refill  . albuterol (VENTOLIN HFA) 108 (90 Base) MCG/ACT inhaler Inhale 2 puffs into the lungs every 6 (six)  hours as needed for wheezing or shortness of breath. 8 g 2  . APIXABAN (ELIQUIS) VTE STARTER PACK (10MG  AND 5MG ) Take as directed on package: start with two-5mg  tablets twice daily for 7 days. On day 8, switch to one-5mg  tablet twice daily. 1 each 0  . Cholecalciferol (VITAMIN D3) 1000 UNITS tablet Take 1,000 Units by mouth daily.    Marland Kitchen docusate sodium (COLACE) 100 MG capsule Take 1 capsule (100 mg total) by mouth 2 (two) times daily as needed for mild constipation or moderate constipation. 30 capsule 2  . fluticasone (FLONASE) 50 MCG/ACT nasal spray Place 1 spray into both nostrils 2 (two) times daily as needed for allergies or rhinitis. 16 g 0  . hydrochlorothiazide (MICROZIDE) 12.5 MG capsule Take 1 capsule (12.5 mg total) by mouth daily. For blood pressure. 90 capsule 3  . loratadine (CLARITIN) 10 MG tablet Take 10 mg by mouth daily.    Marland Kitchen losartan (COZAAR) 50 MG tablet Take 1 tablet (50 mg total) by mouth daily. For blood pressure. 90 tablet 1  . Melatonin 10 MG TABS Take by mouth as needed.    Marland Kitchen omeprazole (PRILOSEC) 20 MG capsule Take 20 mg by mouth daily.    . Potassium 99 MG TABS Take 1 tablet by mouth daily.    . verapamil (VERELAN PM) 360 MG 24 hr capsule Take 1 capsule (360 mg total) by mouth at bedtime. 90 capsule 3   No current facility-administered medications on file prior to visit.    Review of Systems  Constitutional: Negative for activity change, appetite change, fatigue, fever and unexpected weight change.  HENT: Negative for congestion, ear pain, rhinorrhea, sinus pressure and sore throat.   Eyes: Negative for pain, redness and visual disturbance.  Respiratory: Negative for cough, shortness of breath and wheezing.   Cardiovascular: Negative for chest pain, palpitations and leg swelling.  Gastrointestinal: Negative for abdominal pain, blood in stool,  constipation and diarrhea.  Endocrine: Negative for polydipsia and polyuria.  Genitourinary: Negative for dysuria, frequency  and urgency.  Musculoskeletal: Negative for arthralgias, back pain and myalgias.  Skin: Negative for pallor and rash.  Allergic/Immunologic: Negative for environmental allergies.  Neurological: Negative for dizziness, syncope and headaches.  Hematological: Negative for adenopathy. Does not bruise/bleed easily.  Psychiatric/Behavioral: Negative for decreased concentration and dysphoric mood. The patient is not nervous/anxious.        Objective:   Physical Exam Constitutional:      General: She is not in acute distress.    Appearance: Normal appearance. She is well-developed. She is obese. She is not ill-appearing or diaphoretic.  HENT:     Head: Normocephalic and atraumatic.     Mouth/Throat:     Mouth: Mucous membranes are moist.  Eyes:     General: No scleral icterus.    Conjunctiva/sclera: Conjunctivae normal.     Pupils: Pupils are equal, round, and reactive to light.  Neck:     Thyroid: No thyromegaly.     Vascular: No carotid bruit or JVD.  Cardiovascular:     Rate and Rhythm: Normal rate and regular rhythm.     Pulses: Normal pulses.     Heart sounds: Normal heart sounds. No gallop.   Pulmonary:     Effort: Pulmonary effort is normal. No respiratory distress.     Breath sounds: Normal breath sounds. No stridor. No wheezing, rhonchi or rales.     Comments: Good air exch Chest:     Chest wall: No tenderness.  Abdominal:     General: Bowel sounds are normal. There is no distension or abdominal bruit.     Palpations: Abdomen is soft. There is no mass.     Tenderness: There is no abdominal tenderness.  Musculoskeletal:        General: No tenderness.     Cervical back: Normal range of motion and neck supple.     Right lower leg: No edema.     Left lower leg: No edema.     Comments: No palp cords in LEs and no tenderness  Lymphadenopathy:     Cervical: No cervical adenopathy.  Skin:    General: Skin is warm and dry.     Coloration: Skin is not pale.     Findings: No  erythema or rash.  Neurological:     Mental Status: She is alert.     Cranial Nerves: No cranial nerve deficit.     Coordination: Coordination normal.     Deep Tendon Reflexes: Reflexes are normal and symmetric.  Psychiatric:        Mood and Affect: Mood normal.           Assessment & Plan:   Problem List Items Addressed This Visit      Cardiovascular and Mediastinum   Essential hypertension    bp is improved from hosp (recent PE) BP: 140/80  Plan to continue hctz 12.5 mg daily and losartan 50 mg daily Verapamil 360 mg daily Baseline is a little lower  Plan to monitor      Pulmonary embolism ([place]) - Primary    Recently hospitalized Reviewed hospital records, lab results and studies in detail  Stable and doing well  Some sign of pulm HTN-some prominence of main pulm artery on CT scan  Doing well with eliquis and discussed importance of compliance  Will likely require 3 mo of anticoagulation  inst to seek care and alert [place] if sob or  other symptoms return or worsen      Relevant Orders   Ambulatory referral to Cardiology   Cardiomegaly    This noted on CT with some coronary artery calcifications  Grade 2 DD noted on echo  Baseline M on exam  Ref made to cardiology      Pulmonary hypertension ([place])    With recent PE Prominence of main pulm art on CT Ref made to cardiology  Clinically much improved       Relevant Orders   Ambulatory referral to Cardiology   Aortic atherosclerosis ([place])    Incidentally noted on CT scan  Disc her risks for vascular dz  Goal to keep bp and lipids at goal  Ref made to cardiology for further eval      Relevant Orders   Ambulatory referral to Cardiology     Other   MURMUR, CARDIAC, UNDIAGNOSED    Stable  No valve changes noted on epic       Relevant Orders   Ambulatory referral to Cardiology   Hyperlipidemia    Disc goals for lipids and reasons to control them Rev last labs with pt Rev low sat fat diet in  detail Some signs of coronary and aortic atherosclerosis on imaging with recent PE Ref made to cardiology

## 2020-07-19 NOTE — Assessment & Plan Note (Signed)
With recent PE Prominence of main pulm art on CT Ref made to cardiology  Clinically much improved

## 2020-07-19 NOTE — Patient Instructions (Addendum)
The [facility] is very helpful -look them up online and see   Do not miss your eliquis  Let us know when you need a refill  Set a reminder so you don't miss doses   For cholesterol Avoid red meat/ fried foods/ egg yolks/ fatty breakfast meats/ butter, cheese and high fat dairy/ and shellfish    I placed a referral for cardiology-you will get a call

## 2020-07-19 NOTE — Assessment & Plan Note (Signed)
bp is improved from hosp (recent PE) BP: 140/80  Plan to continue hctz 12.5 mg daily and losartan 50 mg daily Verapamil 360 mg daily Baseline is a little lower  Plan to monitor

## 2020-07-19 NOTE — Assessment & Plan Note (Signed)
This noted on CT with some coronary artery calcifications  Grade 2 DD noted on echo  Baseline M on exam  Ref made to cardiology

## 2020-07-19 NOTE — Assessment & Plan Note (Signed)
Recently hospitalized Reviewed hospital records, lab results and studies in detail  Stable and doing well  Some sign of pulm HTN-some prominence of main pulm artery on CT scan  Doing well with eliquis and discussed importance of compliance  Will likely require 3 mo of anticoagulation  inst to seek care and alert [place] if sob or other symptoms return or worsen

## 2020-07-19 NOTE — Assessment & Plan Note (Signed)
Incidentally noted on CT scan  Disc her risks for vascular dz  Goal to keep bp and lipids at goal  Ref made to cardiology for further eval

## 2020-07-19 NOTE — Assessment & Plan Note (Signed)
Stable  No valve changes noted on epic

## 2020-07-19 NOTE — Assessment & Plan Note (Signed)
Disc goals for lipids and reasons to control them Rev last labs with pt Rev low sat fat diet in detail Some signs of coronary and aortic atherosclerosis on imaging with recent PE Ref made to cardiology

## 2020-08-02 ENCOUNTER — Telehealth: Payer: Self-pay

## 2020-08-02 ENCOUNTER — Other Ambulatory Visit: Payer: Self-pay

## 2020-08-02 DIAGNOSIS — I2699 Other pulmonary embolism without acute cor pulmonale: Secondary | ICD-10-CM

## 2020-08-02 MED ORDER — APIXABAN 5 MG PO TABS
5.0000 mg | ORAL_TABLET | Freq: Two times a day (BID) | ORAL | 1 refills | Status: DC
Start: 2020-08-02 — End: 2020-12-13

## 2020-08-02 NOTE — Telephone Encounter (Signed)
Per hospital notes it looks like she should be on Eliquis for 3 months minimum. She's completed the starter pack which is month 1, I'll send in 60 tablets with 1 refill to cover her for months 2 and 3.   Have her speak with cardiology to find out if she needs to take Eliquis longer than 3 months.

## 2020-08-02 NOTE — Telephone Encounter (Signed)
Pt called to request refill on Eliquis.... pt saw Dr Glori Bickers for hosp f/u in your absence... pt would like refill sent to [address]...Marland Kitchen please advise

## 2020-08-02 NOTE — Telephone Encounter (Signed)
Left message to return call to our office.  

## 2020-08-02 NOTE — Telephone Encounter (Signed)
Pt is aware as instructed and has an upcoming f/u with Cardiology

## 2020-08-09 ENCOUNTER — Telehealth: Payer: Self-pay

## 2020-08-09 NOTE — Telephone Encounter (Signed)
Dr Harolyn Rutherford is on call at the hospital and her nurse said she would call patient back with directions when she is done at the hospital.

## 2020-08-09 NOTE — Telephone Encounter (Signed)
Pt left v/m pt has bleeding; does not give more detail but on med list pt is taking eliquis. I spoke with pt; pt started taking eliquis 5 mg in morning and again in the evening on 07/12/20 and this morning pt started with what is like a little more than  light flow menstrual period with couple of clots; pt said is not a heavy flow;pt is not having abd pain but some abd cramping;pt has pulmonary embolism and has FU date with card on 08/16/20. Pt stopped Megace on 07/12/20. Pt has not contacted her OB GYN at this point; Pt is presently not having any CP or SOB. Gentry Fitz NP will review this note and pt will get cb. ED precautions given and pt voiced understanding and will wait for cb. Sending note to Gentry Fitz NP and Joellen CMA in box.

## 2020-08-09 NOTE — Telephone Encounter (Signed)
Pt notified as instructed and voiced understanding.pt will call GYN now and let Gentry Fitz NP know update of what GYN says.  Pt said that she has had 2 pads changed today but pads were not full with blood; pt said size on pad might be a little larger than a 50 cent piece. Pt said the 2 clots earlier today were about 1/8" x 1/8", again pt will cb with update after speaks with GYN.

## 2020-08-09 NOTE — Telephone Encounter (Signed)
Thank you.  Please get a better idea of how much she's bleeding. How many pads/tampons has she used thus far.  Have her contact her GYN. Needs to stay on Eliquis given PE.  Have her update [place] after she speaks with GYN

## 2020-08-09 NOTE — Telephone Encounter (Signed)
Noted and appreciate the help!

## 2020-08-09 NOTE — Telephone Encounter (Signed)
Pt called back GYN wants to verify what she is supposed to ask GYN; again advised to tell GYN what she had told me; that she has PE and started eliquis on 07/12/20 and stopped megace on 07/12/20. Pt started menstrual period this morning and give details of flow and how many times changed pad today and size of blood on pads and also size of blood clots passed earlier today. Pt voiced understanding and will call GYN; pt just wanted to verify what she should say. Pt will call back with update after talks with GYN.

## 2020-08-15 NOTE — Progress Notes (Signed)
Cardiology Office Note  Date:  08/16/2020   ID:  Paige Hester, DOB Nov 21, 1955, MRN [id number]  PCP:  Pleas Koch, NP   Chief Complaint  Patient presents with  . New Patient (Initial Visit)    [facility] follow up PE; shortness of breath. Medications reviewed by the patient verbally.     HPI:  [name] is a 65 year old woman with past medical history of Abdominal pain Prior smoker, quit 1999 (smoked 37 years) Acute PE Presenting by referral from Dr. Glori Bickers for consultation for pulmonary embolism  Traveling back and forth to [place], car and train SOB sx in Jan 2022 Swelling feb and march Seen in the hospital April 2022  intermittent lower extremity swelling and dyspnea on exertion, worse the past several days after a long car drive to [place].    CTA shows small subsegmental PE w/o evidence right heart strain.  Monitored overnight  tentative plan for minimum of 3 months anticoagulation.  CT scan chest July 11, 2020, images pulled up and reviewed in the office today 1. Positive for isolated subsegmental pulmonary embolus in the left upper lobe. 2. Prominence of the main pulmonary artery suggesting pulmonary arterial hypertension. 3. Cardiomegaly with coronary artery calcifications. 4. Mild heterogeneous pulmonary parenchyma, can be seen with small airways disease. 5. Incidental note of gallstones.  Echocardiogram results reviewed in detail 1. Left ventricular ejection fraction, by estimation, is 60 to 65%. The  left ventricle has normal function. The left ventricle has no regional  wall motion abnormalities. Left ventricular diastolic parameters are  consistent with Grade II diastolic  dysfunction (pseudonormalization). The average left ventricular global  longitudinal strain is -15.5 %.  2. Right ventricular systolic function is normal. The right ventricular  size is normal.  3. Left atrial size was mildly dilated.   Currently feels relatively  well, trace lower extremity edema Does not monitor blood pressure at home, elevated today, nervous about coming in Weight trending upwards  EKG personally reviewed by myself on todays visit Shows normal sinus rhythm rate 66 bpm poor R wave progression to the anterior precordial leads, no significant ST-T wave changes   PMH:   has a past medical history of Allergy, Gallbladder polyp (2012), GERD (gastroesophageal reflux disease), Hypertension, Iron deficiency anemia, Menopausal symptoms, Murmur, cardiac, and Vitamin D deficiency.  PSH:    Past Surgical History:  Procedure Laterality Date  . COLONOSCOPY  2010   2020  . HYSTEROSCOPY WITH D & C N/A 07/27/2019   Procedure: DILATATION AND CURETTAGE /HYSTEROSCOPY, Polypectomy;  Surgeon: Osborne Oman, MD;  Location: [place];  Service: Gynecology;  Laterality: N/A;    Current Outpatient Medications  Medication Sig Dispense Refill  . albuterol (VENTOLIN HFA) 108 (90 Base) MCG/ACT inhaler Inhale 2 puffs into the lungs every 6 (six) hours as needed for wheezing or shortness of breath. 8 g 2  . apixaban (ELIQUIS) 5 MG TABS tablet Take 1 tablet (5 mg total) by mouth 2 (two) times daily. 60 tablet 1  . Cholecalciferol (VITAMIN D3) 1000 UNITS tablet Take 1,000 Units by mouth daily.    Marland Kitchen docusate sodium (COLACE) 100 MG capsule Take 1 capsule (100 mg total) by mouth 2 (two) times daily as needed for mild constipation or moderate constipation. 30 capsule 2  . fluticasone (FLONASE) 50 MCG/ACT nasal spray Place 1 spray into both nostrils 2 (two) times daily as needed for allergies or rhinitis. 16 g 0  . hydrochlorothiazide (MICROZIDE) 12.5 MG capsule Take  1 capsule (12.5 mg total) by mouth daily. For blood pressure. 90 capsule 3  . loratadine (CLARITIN) 10 MG tablet Take 10 mg by mouth daily.    Marland Kitchen losartan (COZAAR) 50 MG tablet Take 1 tablet (50 mg total) by mouth daily. For blood pressure. 90 tablet 1  . Melatonin 10 MG TABS Take by  mouth as needed.    Marland Kitchen omeprazole (PRILOSEC) 20 MG capsule Take 20 mg by mouth daily.    . Potassium 99 MG TABS Take 1 tablet by mouth daily.    . verapamil (VERELAN PM) 360 MG 24 hr capsule Take 1 capsule (360 mg total) by mouth at bedtime. 90 capsule 3   No current facility-administered medications for this visit.     Allergies:   Patient has no known allergies.   Social History:  The patient  reports that she quit smoking about 23 years ago. Her smoking use included cigarettes. She smoked 1.00 pack per day. She has never used smokeless tobacco. She reports current alcohol use of about 7.0 standard drinks of alcohol per week. She reports that she does not use drugs.   Family History:   family history includes Colon cancer in her maternal aunt; Coronary artery disease in her sister; Heart attack (age of onset: 76) in her mother; Heart disease in her brother and mother; Heart disease (age of onset: 55) in her father; Hypertension in her mother; Prostate cancer in her father.    Review of Systems: Review of Systems  Constitutional: Negative.   HENT: Negative.   Respiratory: Negative.   Cardiovascular: Positive for leg swelling.  Gastrointestinal: Negative.   Musculoskeletal: Negative.   Neurological: Negative.   Psychiatric/Behavioral: Negative.   All other systems reviewed and are negative.    PHYSICAL EXAM: VS:  BP (!) 160/80 (BP Location: Right Arm, Patient Position: Sitting, Cuff Size: Large)   Pulse 64   Ht 5\' 5"  (1.651 m)   Wt 278 lb 2 oz (126.2 kg)   SpO2 98%   BMI 46.28 kg/m  , BMI Body mass index is 46.28 kg/m. GEN: Well nourished, well developed, in no acute distress HEENT: normal Neck: no JVD, carotid bruits, or masses Cardiac: RRR; no murmurs, rubs, or gallops,no edema  Respiratory:  clear to auscultation bilaterally, normal work of breathing GI: soft, nontender, nondistended, + BS MS: no deformity or atrophy Skin: warm and dry, no rash Neuro:  Strength and  sensation are intact Psych: euthymic mood, full affect   Recent Labs: 07/11/2020: B Natriuretic Peptide 53.2 07/12/2020: ALT 17; BUN 10; Creatinine, Ser 0.59; Hemoglobin 12.0; Magnesium 2.0; Platelets 308; Potassium 3.3; Sodium 142; TSH 2.849    Lipid Panel Lab Results  Component Value Date   CHOL 158 11/18/2019   HDL 42.10 11/18/2019   LDLCALC 105 (H) 11/18/2019   TRIG 52.0 11/18/2019      Wt Readings from Last 3 Encounters:  08/16/20 278 lb 2 oz (126.2 kg)  07/19/20 274 lb 8 oz (124.5 kg)  07/11/20 279 lb 1.6 oz (126.6 kg)       ASSESSMENT AND PLAN:  Problem List Items Addressed This Visit      Cardiology Problems   Essential hypertension   Pulmonary embolism ([place]) - Primary   Aortic atherosclerosis ([place])     Pulmonary embolism Long car trips, plane rides January and February 2022 likely leading to DVT and PE No right heart strain on echo, troponins also negative She will stay on her Eliquis for now Would recommend  prophylactic dosing when she is in the car for long trips or train either the 2.5 or the 5 mg dose Compression hose, recommend she stop at rest stop, stretch her legs, move  Essential hypertension Blood pressure elevated, recommend she increase the losartan up to 100 mg daily Stay on the HCTZ but double her potassium pill which is always running low  Carotid/subclavian disease, bruit appreciated Carotid ultrasound ordered, last done in 2019  Aortic atherosclerosis At least moderate aortic atheroma in the arch, mild distally, at least moderate coronary calcification noted Denies anginal symptoms, we have stressed importance of aggressive cholesterol control We will add Crestor 20 mg daily goal LDL 60 or less Discussed anginal symptoms to watch for, recommend she call us for any concerning symptoms   Total encounter time more than 60 minutes  Greater than 50% was spent in counseling and coordination of care with the patient    Signed, Esmond Plants, M.D., Ph.D. [place], [place]

## 2020-08-16 ENCOUNTER — Other Ambulatory Visit: Payer: Self-pay

## 2020-08-16 ENCOUNTER — Encounter: Payer: Self-pay | Admitting: Cardiovascular Disease

## 2020-08-16 ENCOUNTER — Ambulatory Visit (INDEPENDENT_AMBULATORY_CARE_PROVIDER_SITE_OTHER): Payer: Medicare Other | Admitting: Cardiovascular Disease

## 2020-08-16 VITALS — BP 160/80 | HR 64 | Ht 65.0 in | Wt 278.1 lb

## 2020-08-16 DIAGNOSIS — I700 Atherosclerosis of aorta: Secondary | ICD-10-CM | POA: Diagnosis not present

## 2020-08-16 DIAGNOSIS — I10 Essential (primary) hypertension: Secondary | ICD-10-CM | POA: Diagnosis not present

## 2020-08-16 DIAGNOSIS — I2699 Other pulmonary embolism without acute cor pulmonale: Secondary | ICD-10-CM | POA: Diagnosis not present

## 2020-08-16 DIAGNOSIS — R0989 Other specified symptoms and signs involving the circulatory and respiratory systems: Secondary | ICD-10-CM

## 2020-08-16 DIAGNOSIS — I739 Peripheral vascular disease, unspecified: Secondary | ICD-10-CM

## 2020-08-16 MED ORDER — LOSARTAN POTASSIUM 50 MG PO TABS: 100.0000 mg | ORAL_TABLET | Freq: Every day | ORAL | 1 refills | Status: DC

## 2020-08-16 MED ORDER — ROSUVASTATIN CALCIUM 20 MG PO TABS: 20.0000 mg | ORAL_TABLET | Freq: Every day | ORAL | 3 refills | Status: DC

## 2020-08-16 NOTE — Patient Instructions (Addendum)
Medication Instructions:  Double up on potassium pill, OTC  Increase the losartan up to 100 mg daily  START crestor 20 mg daily  If you need a refill on your cardiac medications before your next appointment, please call your pharmacy.   Lab work: No new labs needed  Testing/Procedures: Carotid artery ultrasound   Follow-Up: At [facility], you and your health needs are our priority.  As part of our continuing mission to provide you with exceptional heart care, we have created designated Provider Care Teams.  These Care Teams include your primary Cardiologist (physician) and Advanced Practice Providers (APPs -  Physician Assistants and Nurse Practitioners) who all work together to provide you with the care you need, when you need it.  . You will need a follow up appointment in 12 months  . Providers on your designated Care Team:   . Murray Hodgkins, NP . Christell Faith, PA-C . Marrianne Mood, PA-C  Any Other Special Instructions Will Be Listed Below  Please monitor blood pressures and keep a log of your readings.   How to use a home blood pressure monitor. . Be still. . Measure at the same time every day. It's important to take the readings at the same time each day, such as morning and evening. Take reading approximately 1 1/2 to 2 hours after BP medications.   AVOID these things for 30 minutes before checking your blood pressure:  Drinking caffeine.  Drinking alcohol.  Eating.  Smoking.  Exercising.   Five minutes before checking your blood pressure:  Pee.  Sit in a dining chair. Avoid sitting in a soft couch or armchair.  Be quiet. Do not talk.       Sit correctly. Sit with your back straight and supported (on a dining chair, rather than a sofa). Your feet should be flat on the floor and your legs should not be crossed. Your arm should be supported on a flat surface (such as a table) with the upper arm at heart level. Make sure the bottom of the cuff is placed  directly above the bend of the elbow.    COVID-19 Vaccine Information can be found at: ShippingScam.co.uk For questions related to vaccine distribution or appointments, please email vaccine@[employer] .com or call (757)292-6642.

## 2020-08-17 ENCOUNTER — Other Ambulatory Visit: Payer: Self-pay

## 2020-08-22 ENCOUNTER — Ambulatory Visit (INDEPENDENT_AMBULATORY_CARE_PROVIDER_SITE_OTHER): Payer: Medicare Other

## 2020-08-22 ENCOUNTER — Other Ambulatory Visit: Payer: Self-pay

## 2020-08-22 DIAGNOSIS — I739 Peripheral vascular disease, unspecified: Secondary | ICD-10-CM | POA: Diagnosis not present

## 2020-08-22 DIAGNOSIS — R0989 Other specified symptoms and signs involving the circulatory and respiratory systems: Secondary | ICD-10-CM

## 2020-09-04 ENCOUNTER — Telehealth: Payer: Self-pay | Admitting: *Deleted

## 2020-09-04 NOTE — Telephone Encounter (Signed)
-----   Message from Minna Merritts, MD sent at 09/02/2020  2:14 PM EDT ----- Carotid ultrasound Mild plaque bilaterally less than 50% range No significant change

## 2020-09-04 NOTE — Telephone Encounter (Signed)
No answer/Voicemail box is full. Will send My Chart message as well to give [place] a call for review of results.

## 2020-09-04 NOTE — Telephone Encounter (Signed)
Patient returning call.

## 2020-09-04 NOTE — Telephone Encounter (Signed)
Reviewed results with patient and she verbalized understanding with no further questions at this time.  

## 2020-09-17 ENCOUNTER — Other Ambulatory Visit: Payer: Self-pay | Admitting: Obstetrics & Gynecology

## 2020-09-24 ENCOUNTER — Other Ambulatory Visit: Payer: Self-pay | Admitting: Obstetrics & Gynecology

## 2020-09-26 ENCOUNTER — Other Ambulatory Visit: Payer: Federal, State, Local not specified - PPO

## 2020-09-26 ENCOUNTER — Telehealth (INDEPENDENT_AMBULATORY_CARE_PROVIDER_SITE_OTHER): Payer: Medicare Other | Admitting: Primary Care

## 2020-09-26 ENCOUNTER — Other Ambulatory Visit: Payer: Self-pay

## 2020-09-26 ENCOUNTER — Encounter: Payer: Self-pay | Admitting: Primary Care

## 2020-09-26 VITALS — BP 137/83 | HR 81 | Temp 100.0°F | Ht 65.0 in | Wt 281.0 lb

## 2020-09-26 DIAGNOSIS — R0683 Snoring: Secondary | ICD-10-CM | POA: Diagnosis not present

## 2020-09-26 DIAGNOSIS — R059 Cough, unspecified: Secondary | ICD-10-CM | POA: Diagnosis not present

## 2020-09-26 NOTE — Patient Instructions (Signed)
  Come by at 2:30 this afternoon for your COVID test.  You will be contacted regarding your for the sleep study.  Please let us know if you have not been contacted within two weeks.   It was a pleasure to see you today! [name], NP-C

## 2020-09-26 NOTE — Assessment & Plan Note (Signed)
Acute for the last 4 days, exposed to COVID-19 3 days prior.   Will have her come by for PCR COVID testing as she developed fevers today with symptom progression.   Will send MyChart message with over-the-counter options for symptom management.  Return precautions provided.  She appears stable today.

## 2020-09-26 NOTE — Assessment & Plan Note (Signed)
Referral placed for sleep study through pulmonology evaluation.

## 2020-09-26 NOTE — Progress Notes (Signed)
Patient ID: Paige Hester, female    DOB: 01-03-1956, 65 y.o.   MRN: [id number]  Virtual visit completed through [place], a video enabled telemedicine application. Due to national recommendations of social distancing due to COVID-19, a virtual visit is felt to be most appropriate for this patient at this time. Reviewed limitations, risks, security and privacy concerns of performing a virtual visit and the availability of in person appointments. I also reviewed that there may be a patient responsible charge related to this service. The patient agreed to proceed.   Patient location: home Provider location: [place] at [facility], office Persons participating in this virtual visit: patient, provider   If any vitals were documented, they were collected by patient at home unless specified below.    BP 137/83   Pulse 81   Temp 100 F (37.8 C) (Oral)   Ht 5\' 5"  (1.651 m)   Wt 281 lb (127.5 kg)   BMI 46.76 kg/m    CC: Cough Subjective:   HPI: Paige Hester is a 65 y.o. female with a history of pulmonary embolism, hypertension, pulmonary hypertension, prediabetes, GERD presenting on 09/26/2020 for cough and to request sleep study.   Symptoms began four days ago with symptoms of cough. She developed a fever this morning of 100.0, coughed up some phlegm that contained blood tinged, body aches, head pressure. She was exposed to Covid-19 three days ago through church, several people have tested negative and positive for COVID-19. She completed a rapid Covid test yesterday at [facility] which came back negative.   She's completed three Covid vaccines. She's not taken anything OTC for symptoms.   She was told by her cardiologist to consider sleep study.  She snores, feels tired during the day, wakes during the night with palpitations.  History of pulmonary embolism in April 2022. She has a strong family history of sleep apnea and 2 sisters.         Relevant past medical,  surgical, family and social history reviewed and updated as indicated. Interim medical history since our last visit reviewed. Allergies and medications reviewed and updated. Outpatient Medications Prior to Visit  Medication Sig Dispense Refill   albuterol (VENTOLIN HFA) 108 (90 Base) MCG/ACT inhaler Inhale 2 puffs into the lungs every 6 (six) hours as needed for wheezing or shortness of breath. 8 g 2   apixaban (ELIQUIS) 5 MG TABS tablet Take 1 tablet (5 mg total) by mouth 2 (two) times daily. 60 tablet 1   Cholecalciferol (VITAMIN D3) 1000 UNITS tablet Take 1,000 Units by mouth daily.     docusate sodium (COLACE) 100 MG capsule Take 1 capsule (100 mg total) by mouth 2 (two) times daily as needed for mild constipation or moderate constipation. 30 capsule 2   fluticasone (FLONASE) 50 MCG/ACT nasal spray Place 1 spray into both nostrils 2 (two) times daily as needed for allergies or rhinitis. 16 g 0   hydrochlorothiazide (MICROZIDE) 12.5 MG capsule Take 1 capsule (12.5 mg total) by mouth daily. For blood pressure. 90 capsule 3   loratadine (CLARITIN) 10 MG tablet Take 10 mg by mouth daily.     losartan (COZAAR) 50 MG tablet Take 2 tablets (100 mg total) by mouth daily. For blood pressure. 90 tablet 1   megestrol (MEGACE) 40 MG tablet TAKE 1 TABLET BY MOUTH TWICE DAILY CAN  INCREASE  TO  2  TABLETS  TWICE  DAILY  IN  THE  EVENT  OF  HEAVY  BLEEDING 60 tablet 0   Melatonin 10 MG TABS Take by mouth as needed.     omeprazole (PRILOSEC) 20 MG capsule Take 20 mg by mouth daily.     Potassium 99 MG TABS Take 1 tablet by mouth daily.     rosuvastatin (CRESTOR) 20 MG tablet Take 1 tablet (20 mg total) by mouth daily. 90 tablet 3   verapamil (VERELAN PM) 360 MG 24 hr capsule Take 1 capsule (360 mg total) by mouth at bedtime. 90 capsule 3   No facility-administered medications prior to visit.     Per HPI unless specifically indicated in ROS section below Review of Systems  Constitutional:  Positive for  fatigue and fever.  Respiratory:  Positive for cough. Negative for shortness of breath.   Cardiovascular:  Positive for palpitations. Negative for chest pain.  Musculoskeletal:  Positive for myalgias.  Neurological:  Positive for headaches.  Psychiatric/Behavioral:  Positive for sleep disturbance.   Objective:  BP 137/83   Pulse 81   Temp 100 F (37.8 C) (Oral)   Ht 5\' 5"  (1.651 m)   Wt 281 lb (127.5 kg)   BMI 46.76 kg/m   Wt Readings from Last 3 Encounters:  09/26/20 281 lb (127.5 kg)  08/16/20 278 lb 2 oz (126.2 kg)  07/19/20 274 lb 8 oz (124.5 kg)       Physical exam: Gen: alert, NAD, not ill appearing Pulm: speaks in complete sentences without increased work of breathing Psych: normal mood, normal thought content      Results for orders placed or performed during the hospital encounter of 07/11/20  Resp Panel by RT-PCR (Flu A&B, Covid) Nasopharyngeal Swab   Specimen: Nasopharyngeal Swab; Nasopharyngeal(NP) swabs in vial transport medium  Result Value Ref Range   SARS Coronavirus 2 by RT PCR NEGATIVE NEGATIVE   Influenza A by PCR NEGATIVE NEGATIVE   Influenza B by PCR NEGATIVE NEGATIVE  CBC  Result Value Ref Range   WBC 7.3 4.0 - 10.5 K/uL   RBC 4.44 3.87 - 5.11 MIL/uL   Hemoglobin 12.4 12.0 - 15.0 g/dL   HCT 37.3 36.0 - 46.0 %   MCV 84.0 80.0 - 100.0 fL   MCH 27.9 26.0 - 34.0 pg   MCHC 33.2 30.0 - 36.0 g/dL   RDW 14.2 11.5 - 15.5 %   Platelets 304 150 - 400 K/uL   nRBC 0.0 0.0 - 0.2 %  Comprehensive metabolic panel  Result Value Ref Range   Sodium 141 135 - 145 mmol/L   Potassium 3.3 (L) 3.5 - 5.1 mmol/L   Chloride 105 98 - 111 mmol/L   CO2 28 22 - 32 mmol/L   Glucose, Bld 102 (H) 70 - 99 mg/dL   BUN 13 8 - 23 mg/dL   Creatinine, Ser 0.81 0.44 - 1.00 mg/dL   Calcium 9.6 8.9 - 10.3 mg/dL   Total Protein 7.0 6.5 - 8.1 g/dL   Albumin 3.5 3.5 - 5.0 g/dL   AST 18 15 - 41 U/L   ALT 18 0 - 44 U/L   Alkaline Phosphatase 56 38 - 126 U/L   Total Bilirubin 0.5  0.3 - 1.2 mg/dL   GFR, Estimated >60 >60 mL/min   Anion gap 8 5 - 15  Brain natriuretic peptide  Result Value Ref Range   B Natriuretic Peptide 53.2 0.0 - 100.0 pg/mL  APTT  Result Value Ref Range   aPTT 26 24 - 36 seconds  Protime-INR  Result Value Ref Range  Prothrombin Time 13.2 11.4 - 15.2 seconds   INR 1.0 0.8 - 1.2  Magnesium  Result Value Ref Range   Magnesium 2.0 1.7 - 2.4 mg/dL  Urinalysis, Complete w Microscopic  Result Value Ref Range   Color, Urine YELLOW (A) YELLOW   APPearance CLEAR (A) CLEAR   Specific Gravity, Urine 1.044 (H) 1.005 - 1.030   pH 6.0 5.0 - 8.0   Glucose, UA NEGATIVE NEGATIVE mg/dL   Hgb urine dipstick SMALL (A) NEGATIVE   Bilirubin Urine NEGATIVE NEGATIVE   Ketones, ur NEGATIVE NEGATIVE mg/dL   Protein, ur 30 (A) NEGATIVE mg/dL   Nitrite NEGATIVE NEGATIVE   Leukocytes,Ua TRACE (A) NEGATIVE   RBC / HPF 0-5 0 - 5 RBC/hpf   WBC, UA 6-10 0 - 5 WBC/hpf   Bacteria, UA NONE SEEN NONE SEEN   Squamous Epithelial / LPF 0-5 0 - 5  Heparin level (unfractionated)  Result Value Ref Range   Heparin Unfractionated 0.62 0.30 - 0.70 IU/mL  Magnesium  Result Value Ref Range   Magnesium 2.0 1.7 - 2.4 mg/dL  Phosphorus  Result Value Ref Range   Phosphorus 3.4 2.5 - 4.6 mg/dL  CBC WITH DIFFERENTIAL  Result Value Ref Range   WBC 9.4 4.0 - 10.5 K/uL   RBC 4.34 3.87 - 5.11 MIL/uL   Hemoglobin 12.0 12.0 - 15.0 g/dL   HCT 36.9 36.0 - 46.0 %   MCV 85.0 80.0 - 100.0 fL   MCH 27.6 26.0 - 34.0 pg   MCHC 32.5 30.0 - 36.0 g/dL   RDW 14.2 11.5 - 15.5 %   Platelets 308 150 - 400 K/uL   nRBC 0.0 0.0 - 0.2 %   Neutrophils Relative % 47 %   Neutro Abs 4.4 1.7 - 7.7 K/uL   Lymphocytes Relative 41 %   Lymphs Abs 3.9 0.7 - 4.0 K/uL   Monocytes Relative 8 %   Monocytes Absolute 0.8 0.1 - 1.0 K/uL   Eosinophils Relative 3 %   Eosinophils Absolute 0.3 0.0 - 0.5 K/uL   Basophils Relative 1 %   Basophils Absolute 0.1 0.0 - 0.1 K/uL   Immature Granulocytes 0 %   Abs  Immature Granulocytes 0.02 0.00 - 0.07 K/uL  TSH  Result Value Ref Range   TSH 2.849 0.350 - 4.500 uIU/mL  Comprehensive metabolic panel  Result Value Ref Range   Sodium 142 135 - 145 mmol/L   Potassium 3.3 (L) 3.5 - 5.1 mmol/L   Chloride 107 98 - 111 mmol/L   CO2 25 22 - 32 mmol/L   Glucose, Bld 96 70 - 99 mg/dL   BUN 10 8 - 23 mg/dL   Creatinine, Ser 0.59 0.44 - 1.00 mg/dL   Calcium 9.2 8.9 - 10.3 mg/dL   Total Protein 6.8 6.5 - 8.1 g/dL   Albumin 3.6 3.5 - 5.0 g/dL   AST 18 15 - 41 U/L   ALT 17 0 - 44 U/L   Alkaline Phosphatase 60 38 - 126 U/L   Total Bilirubin 0.8 0.3 - 1.2 mg/dL   GFR, Estimated >60 >60 mL/min   Anion gap 10 5 - 15  ECHOCARDIOGRAM COMPLETE  Result Value Ref Range   Weight 4,465.64 oz   Height 65 in   BP 158/67 mmHg   Ao pk vel 2.19 m/s   AV Area VTI 2.34 cm2   AR max vel 2.10 cm2   AV Mean grad 10.0 mmHg   AV Peak grad 19.2 mmHg  S' Lateral 2.90 cm   AV Area mean vel 2.22 cm2   Area-P 1/2 2.75 cm2   MV VTI 1.89 cm2  Troponin I (High Sensitivity)  Result Value Ref Range   Troponin I (High Sensitivity) 8 <18 ng/L  Troponin I (High Sensitivity)  Result Value Ref Range   Troponin I (High Sensitivity) 8 <18 ng/L   Assessment & Plan:   Problem List Items Addressed This Visit   None Visit Diagnoses     Cough    -  Primary   Relevant Orders   Novel Coronavirus, NAA ([facility])        No orders of the defined types were placed in this encounter.  Orders Placed This Encounter  Procedures   Novel Coronavirus, NAA ([facility])    Standing Status:   Future    Number of Occurrences:   1    Standing Expiration Date:   09/26/2021    Order Specific Question:   Is this test for diagnosis or screening    Answer:   Diagnosis of ill patient    Order Specific Question:   Symptomatic for COVID-19 as defined by CDC    Answer:   Yes    Order Specific Question:   Date of Symptom Onset    Answer:   09/24/2020    Order Specific Question:   Hospitalized for  COVID-19    Answer:   No    Order Specific Question:   Admitted to ICU for COVID-19    Answer:   No    Order Specific Question:   Resident in a congregate (group) care setting    Answer:   No    Order Specific Question:   Is the patient student?    Answer:   No    Order Specific Question:   Employed in healthcare setting    Answer:   No    Order Specific Question:   Has patient completed COVID vaccination(s) (2 doses of Pfizer/Moderna 1 dose of [place])    Answer:   Unknown    Order Specific Question:   Previously tested for COVID-19    Answer:   Yes    Order Specific Question:   Pregnant    Answer:   No    I discussed the assessment and treatment plan with the patient. The patient was provided an opportunity to ask questions and all were answered. The patient agreed with the plan and demonstrated an understanding of the instructions. The patient was advised to call back or seek an in-person evaluation if the symptoms worsen or if the condition fails to improve as anticipated.  Follow up plan:  Come by at 2:30 this afternoon for your COVID test.  You will be contacted regarding your for the sleep study.  Please let us know if you have not been contacted within two weeks.   It was a pleasure to see you today! [name], NP-C    Pleas Koch, NP

## 2020-09-27 ENCOUNTER — Other Ambulatory Visit: Payer: Self-pay | Admitting: Family Medicine

## 2020-09-27 LAB — SARS-COV-2, NAA 2 DAY TAT

## 2020-09-27 LAB — NOVEL CORONAVIRUS, NAA: SARS-CoV-2, NAA: DETECTED — AB

## 2020-09-27 MED ORDER — MOLNUPIRAVIR EUA 200MG CAPSULE: 4.0000 | ORAL_CAPSULE | Freq: Two times a day (BID) | ORAL | 0 refills | Status: AC

## 2020-09-28 NOTE — Telephone Encounter (Signed)
Please advise 

## 2020-09-28 NOTE — Telephone Encounter (Signed)
Patient advised.

## 2020-09-28 NOTE — Telephone Encounter (Signed)
No need to discontinue delsym on that antiviral.

## 2020-10-12 NOTE — Telephone Encounter (Signed)
Pt called and left VM on triage line just making sure that Anda Kraft see's the [place] message from pt before the weekend.

## 2020-10-15 NOTE — Telephone Encounter (Signed)
Pt c/o medication issue:  1. Name of Medication: Eliquis   2. How are you currently taking this medication (dosage and times per day)? 5 mg po BID  3. Are you having a reaction (difficulty breathing--STAT)? no  4. What is your medication issue? Patient not sure if she is to continue .  Please call.

## 2020-10-16 ENCOUNTER — Other Ambulatory Visit: Payer: Self-pay | Admitting: Primary Care

## 2020-10-16 DIAGNOSIS — I10 Essential (primary) hypertension: Secondary | ICD-10-CM

## 2020-10-16 NOTE — Telephone Encounter (Signed)
[name],  This patient was referred to Dr. Rockey Situ (by one of our physicians) for evaluation of pulmonary embolism. I did not initiate her Eliquis medication as this was initiated during her hospital stay in April 2022. I provided her with refills. Dr. Rockey Situ saw patient in June 2022.  Dr. Rockey Situ,  Any thoughts on how long to continue her Eliquis? You mentioned in her note a minimum of three months which is what I'm seeing from hospital discharge. This is her first clot, symptoms are improving. I appreciate any thoughts. It looks like Dr. Glori Bickers sent her over to you in early June 2022.   Thank you both for your time and attention!  Warm regards, [name], NP-C

## 2020-10-18 ENCOUNTER — Other Ambulatory Visit: Payer: Self-pay

## 2020-10-18 ENCOUNTER — Ambulatory Visit (INDEPENDENT_AMBULATORY_CARE_PROVIDER_SITE_OTHER): Payer: Medicare Other | Admitting: Primary Care

## 2020-10-18 ENCOUNTER — Encounter: Payer: Self-pay | Admitting: Primary Care

## 2020-10-18 VITALS — BP 130/78 | HR 97 | Temp 97.9°F | Ht 65.0 in | Wt 282.0 lb

## 2020-10-18 DIAGNOSIS — I2699 Other pulmonary embolism without acute cor pulmonale: Secondary | ICD-10-CM

## 2020-10-18 DIAGNOSIS — I700 Atherosclerosis of aorta: Secondary | ICD-10-CM

## 2020-10-18 DIAGNOSIS — E7800 Pure hypercholesterolemia, unspecified: Secondary | ICD-10-CM

## 2020-10-18 DIAGNOSIS — I10 Essential (primary) hypertension: Secondary | ICD-10-CM

## 2020-10-18 DIAGNOSIS — R7303 Prediabetes: Secondary | ICD-10-CM

## 2020-10-18 DIAGNOSIS — Z23 Encounter for immunization: Secondary | ICD-10-CM | POA: Diagnosis not present

## 2020-10-18 DIAGNOSIS — E2839 Other primary ovarian failure: Secondary | ICD-10-CM

## 2020-10-18 LAB — COMPREHENSIVE METABOLIC PANEL
ALT: 13 U/L (ref 0–35)
AST: 18 U/L (ref 0–37)
Albumin: 4 g/dL (ref 3.5–5.2)
Alkaline Phosphatase: 59 U/L (ref 39–117)
BUN: 8 mg/dL (ref 6–23)
CO2: 27 mEq/L (ref 19–32)
Calcium: 9.8 mg/dL (ref 8.4–10.5)
Chloride: 106 mEq/L (ref 96–112)
Creatinine, Ser: 0.85 mg/dL (ref 0.40–1.20)
GFR: 71.73 mL/min (ref >60.00)
Glucose, Bld: 89 mg/dL (ref 70–99)
Potassium: 3.9 mEq/L (ref 3.5–5.1)
Sodium: 139 mEq/L (ref 135–145)
Total Bilirubin: 0.4 mg/dL (ref 0.2–1.2)
Total Protein: 7.4 g/dL (ref 6.0–8.3)

## 2020-10-18 LAB — LIPID PANEL
Cholesterol: 108 mg/dL (ref 0–200)
HDL: 45.6 mg/dL (ref >39.00)
LDL Cholesterol: 54 mg/dL (ref 0–99)
NonHDL: 62.55
Total CHOL/HDL Ratio: 2
Triglycerides: 43 mg/dL (ref 0.0–149.0)
VLDL: 8.6 mg/dL (ref 0.0–40.0)

## 2020-10-18 LAB — HEMOGLOBIN A1C: Hgb A1c MFr Bld: 6.1 % (ref 4.6–6.5)

## 2020-10-18 MED ORDER — LOSARTAN POTASSIUM-HCTZ 100-12.5 MG PO TABS: 1.0000 | ORAL_TABLET | Freq: Every day | ORAL | 0 refills | Status: DC

## 2020-10-18 NOTE — Patient Instructions (Signed)
Stop by the lab prior to leaving today. I will notify you of your results once received.   It was a pleasure to see you today!  

## 2020-10-18 NOTE — Assessment & Plan Note (Signed)
Compliant to Crestor 20 mg, repeat lipids and LFT's pending.

## 2020-10-18 NOTE — Addendum Note (Signed)
Addended by: Francella Solian on: 10/18/2020 11:24 AM   Modules accepted: Orders

## 2020-10-18 NOTE — Assessment & Plan Note (Signed)
Compliant to Crestor 20 mg. Repeat lipid panel pending.

## 2020-10-18 NOTE — Assessment & Plan Note (Signed)
Compliant to Eliquis 5 mg BID for 3 months, unclear whether she needs to continue. She does have dyspnea which could be secondary to her obesity and recovery from Covid.   This is her first PE/DVT, but is at higher risk for clotting given co-morbidities. Discussed with patient. Will consult with specialist.

## 2020-10-18 NOTE — Assessment & Plan Note (Signed)
Improved on losartan 100 mg, compliant to HCTZ 12.5 mg.   Will combine regimen and send in new Rx for losartan-HCTZ 100-12.5 mg daily. Repeat CMP pending.

## 2020-10-18 NOTE — Progress Notes (Signed)
Subjective:    Patient ID: Paige Hester, female    DOB: 1955-09-07, 66 y.o.   MRN: [id number]  HPI  Paige Hester is a very pleasant 65 y.o. female with a history of hypertension, pulmonary embolism, pulmonary hypertension, aortic atherosclerosis who presents today for follow up of pulmonary embolism.  Admitted from 04/27-04/28 2022 for symptoms of shortness of breath x 1-2 months and intermittent lower extremity swelling that became worse after long car travel to [place] several days prior. Work up in the ED with CVA positive for small subsegmental PE without evidence of right heart strain. Admitted for evaluation. Discharged on Eliquis regimen for minimum of three months.   Evaluated by cardiology on 08/16/20 for follow up of PE and shortness of breath. Recommendation was to continue Eliquis BID for "now" and to use prophylactic dosing when on long car rides. Other recommendations were to increase losartan to 100 mg, continue HCTZ, double potassium tablet. Crestor 20 mg was added to aggressively treat LDL to goal of <60.  She sent a message via the MyChart portal several days ago questioning how long to continue Eliquis. She reached out to her cardiology office and was told to discuss with PCP.   Today she's continued to take her Eliquis 5 mg BID, has three tablets remaining. She has never had a DVT or PE previously. She is managed on Megace once daily per GYN to prevent vaginal bleeding from cervical polyps. She notices recurrent spotting without Megace.   She is compliant to the increased dose of losartan 100 mg (needing new Rx) and rosuvastatin 20 mg. She is due for repeat lipid panel and CMP today. Her shortness of breath has improved some since her hospital stay. She feels recovered from Covid-19 infection. She denies pain on deep inspiration but have chest tightness to upper mid chest with movement. She does notice intermittent lower extremity edema bilaterally, has improved  overall.   BP Readings from Last 3 Encounters:  10/18/20 130/78  09/26/20 137/83  08/16/20 (!) 160/80      Review of Systems  Constitutional:  Positive for fatigue.  Respiratory:  Positive for chest tightness and shortness of breath. Negative for cough.   Cardiovascular:  Negative for chest pain and leg swelling.  Neurological:  Negative for dizziness and headaches.        Past Medical History:  Diagnosis Date   Allergy    Gallbladder polyp 2012   GERD (gastroesophageal reflux disease)    Hypertension    Iron deficiency anemia    Menopausal symptoms    since 2007   Murmur, cardiac    Vitamin D deficiency     Social History   Socioeconomic History   Marital status: Married    Spouse name: Not on file   Number of children: Not on file   Years of education: Not on file   Highest education level: Not on file  Occupational History   Not on file  Tobacco Use   Smoking status: Former    Packs/day: 1.00    Types: Cigarettes    Quit date: 1999    Years since quitting: 23.6   Smokeless tobacco: Never  Vaping Use   Vaping Use: Never used  Substance and Sexual Activity   Alcohol use: Yes    Alcohol/week: 7.0 standard drinks    Types: 7 Glasses of wine per week   Drug use: No   Sexual activity: Yes  Other Topics Concern   Not on file  Social History Narrative   Married.   1 child, 1 grandchild.   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   Enjoys going to [employer], singing.    Social Determinants of Health   Financial Resource Strain: Not on file  Food Insecurity: Not on file  Transportation Needs: Not on file  Physical Activity: Not on file  Stress: Not on file  Social Connections: Not on file  Intimate Partner Violence: Not on file    Past Surgical History:  Procedure Laterality Date   COLONOSCOPY  2010   2020   HYSTEROSCOPY WITH D & C N/A 07/27/2019   Procedure: DILATATION AND CURETTAGE /HYSTEROSCOPY,  Polypectomy;  Surgeon: Osborne Oman, MD;  Location: [place];  Service: Gynecology;  Laterality: N/A;    Family History  Problem Relation Age of Onset   Heart disease Mother    Hypertension Mother    Heart attack Mother 39   Prostate cancer Father    Heart disease Father 54       CAD, STENT   Heart disease Brother        Myocardial infarction   Colon cancer Maternal Aunt    Coronary artery disease Sister    Colon polyps Neg Hx    Esophageal cancer Neg Hx    Rectal cancer Neg Hx    Stomach cancer Neg Hx     No Known Allergies  Current Outpatient Medications on File Prior to Visit  Medication Sig Dispense Refill   albuterol (VENTOLIN HFA) 108 (90 Base) MCG/ACT inhaler Inhale 2 puffs into the lungs every 6 (six) hours as needed for wheezing or shortness of breath. 8 g 2   apixaban (ELIQUIS) 5 MG TABS tablet Take 1 tablet (5 mg total) by mouth 2 (two) times daily. 60 tablet 1   Cholecalciferol (VITAMIN D3) 1000 UNITS tablet Take 1,000 Units by mouth daily.     docusate sodium (COLACE) 100 MG capsule Take 1 capsule (100 mg total) by mouth 2 (two) times daily as needed for mild constipation or moderate constipation. 30 capsule 2   fluticasone (FLONASE) 50 MCG/ACT nasal spray Place 1 spray into both nostrils 2 (two) times daily as needed for allergies or rhinitis. 16 g 0   hydrochlorothiazide (MICROZIDE) 12.5 MG capsule Take 1 capsule (12.5 mg total) by mouth daily. For blood pressure. 90 capsule 3   loratadine (CLARITIN) 10 MG tablet Take 10 mg by mouth daily.     losartan (COZAAR) 50 MG tablet Take 1 tablet by mouth once daily for blood pressure 90 tablet 0   megestrol (MEGACE) 40 MG tablet TAKE 1 TABLET BY MOUTH TWICE DAILY CAN  INCREASE  TO  2  TABLETS  TWICE  DAILY  IN  THE  EVENT  OF  HEAVY  BLEEDING 60 tablet 0   Melatonin 10 MG TABS Take by mouth as needed.     omeprazole (PRILOSEC) 20 MG capsule Take 20 mg by mouth daily.     Potassium 99 MG TABS Take 1  tablet by mouth daily.     rosuvastatin (CRESTOR) 20 MG tablet Take 1 tablet (20 mg total) by mouth daily. 90 tablet 3   verapamil (VERELAN PM) 360 MG 24 hr capsule Take 1 capsule (360 mg total) by mouth at bedtime. 90 capsule 3   No current facility-administered medications on file prior to visit.    BP 130/78   Pulse 97   Temp 97.9 F (36.6  C) (Temporal)   Ht '5\' 5"'$  (1.651 m)   Wt 282 lb (127.9 kg)   SpO2 97%   BMI 46.93 kg/m  Objective:   Physical Exam Cardiovascular:     Rate and Rhythm: Normal rate and regular rhythm.     Heart sounds: Murmur heard.  Pulmonary:     Effort: Pulmonary effort is normal.     Breath sounds: Normal breath sounds.  Musculoskeletal:     Cervical back: Neck supple.  Skin:    General: Skin is warm and dry.          Assessment & Plan:      This visit occurred during the SARS-CoV-2 public health emergency.  Safety protocols were in place, including screening questions prior to the visit, additional usage of staff PPE, and extensive cleaning of exam room while observing appropriate contact time as indicated for disinfecting solutions.

## 2020-10-18 NOTE — Assessment & Plan Note (Signed)
Repeat A1C pending. 

## 2020-10-22 NOTE — Telephone Encounter (Signed)
Spoke with Dr. Rockey Situ via secure chat. Will close out note.

## 2020-10-29 ENCOUNTER — Other Ambulatory Visit: Payer: Self-pay

## 2020-10-30 ENCOUNTER — Other Ambulatory Visit: Payer: Self-pay | Admitting: *Deleted

## 2020-10-30 ENCOUNTER — Telehealth: Payer: Self-pay | Admitting: *Deleted

## 2020-10-30 MED ORDER — MEGESTROL ACETATE 40 MG PO TABS: ORAL_TABLET | ORAL | 0 refills | Status: DC

## 2020-10-30 NOTE — Telephone Encounter (Signed)
-----   Message from Jari Pigg sent at 10/30/2020  2:04 PM EDT ----- Regarding: 21 prescription This pt has a 21 day supply of her script and needs an additional one called in [place] be on vacation for 31 days and will be unable to get it or get it filled so she wants to take it with her

## 2020-10-30 NOTE — Telephone Encounter (Signed)
Refills sent

## 2020-11-21 ENCOUNTER — Encounter (INDEPENDENT_AMBULATORY_CARE_PROVIDER_SITE_OTHER): Payer: Self-pay

## 2020-12-11 NOTE — Progress Notes (Signed)
Cardiology Office Note:    Date:  12/13/2020   ID:  Paige Hester, DOB 08/29/1955, MRN [id number]  PCP:  Pleas Koch, NP  [facility] Cardiologist:  None  [facility] Electrophysiologist:  None   Referring MD: Pleas Koch, NP   Chief Complaint: SOB  History of Present Illness:    Paige Hester is a 65 y.o. female with a hx of prior smoker, acute PE who presents for follow-up.   Seen by Dr. Rockey Situ 08/16/20 for PE diagnosed in April 2022. Plan for 3 months anticoagulation with Eliquis. CT showed cardiomegaly with coronary artery calcifications. Echo showed LVEF 60-65%, G2DD. Crestor was added.   Today, the patient reports shortness of breath. She was away for a month. When she flew back in she noticed her ankles started swelling again. She had to get assistant from the cart driver bc she couldn't breath. She came back almost a week ago. Breathing is the same. No chest pain. No pain in the legs, just swelling. She is not on her blood thinner. She missed her apt yesterday with PCP dur to car issues. EKG with SR, 70bpm, unchanged.   No drug use, occasional alcohol use.   Past Medical History:  Diagnosis Date   Allergy    Gallbladder polyp 2012   GERD (gastroesophageal reflux disease)    Hypertension    Iron deficiency anemia    Menopausal symptoms    since 2007   Murmur, cardiac    Pulmonary embolism ([place]) 06/2020   Vitamin D deficiency     Past Surgical History:  Procedure Laterality Date   COLONOSCOPY  2010   2020   HYSTEROSCOPY WITH D & C N/A 07/27/2019   Procedure: DILATATION AND CURETTAGE /HYSTEROSCOPY, Polypectomy;  Surgeon: Osborne Oman, MD;  Location: [place];  Service: Gynecology;  Laterality: N/A;    Current Medications: Current Meds  Medication Sig   albuterol (VENTOLIN HFA) 108 (90 Base) MCG/ACT inhaler Inhale 2 puffs into the lungs every 6 (six) hours as needed for wheezing or shortness of breath.    Cholecalciferol (VITAMIN D3) 1000 UNITS tablet Take 1,000 Units by mouth daily.   docusate sodium (COLACE) 100 MG capsule Take 1 capsule (100 mg total) by mouth 2 (two) times daily as needed for mild constipation or moderate constipation.   fluticasone (FLONASE) 50 MCG/ACT nasal spray Place 1 spray into both nostrils 2 (two) times daily as needed for allergies or rhinitis.   loratadine (CLARITIN) 10 MG tablet Take 10 mg by mouth daily.   losartan-hydrochlorothiazide (HYZAAR) 100-12.5 MG tablet Take 1 tablet by mouth daily. For blood pressure.   megestrol (MEGACE) 40 MG tablet TAKE 1 TABLET BY MOUTH TWICE DAILY CAN  INCREASE  TO  2  TABLETS  TWICE  DAILY  IN  THE  EVENT  OF  HEAVY  BLEEDING   Melatonin 10 MG TABS Take by mouth as needed.   omeprazole (PRILOSEC) 20 MG capsule Take 20 mg by mouth daily.   Potassium 99 MG TABS Take 1 tablet by mouth daily.   rosuvastatin (CRESTOR) 20 MG tablet Take 1 tablet (20 mg total) by mouth daily.   verapamil (VERELAN PM) 360 MG 24 hr capsule Take 1 capsule (360 mg total) by mouth at bedtime.     Allergies:   Patient has no known allergies.   Social History   Socioeconomic History   Marital status: Legally Separated    Spouse name: Not on file  Number of children: Not on file   Years of education: Not on file   Highest education level: Not on file  Occupational History   Not on file  Tobacco Use   Smoking status: Former    Packs/day: 1.00    Years: 27.00    Pack years: 27.00    Types: Cigarettes    Quit date: 1999    Years since quitting: 23.7   Smokeless tobacco: Never  Vaping Use   Vaping Use: Never used  Substance and Sexual Activity   Alcohol use: Yes    Alcohol/week: 7.0 standard drinks    Types: 7 Glasses of wine per week   Drug use: No   Sexual activity: Yes  Other Topics Concern   Not on file  Social History Narrative   Married.   1 child, 1 grandchild.   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   Enjoys going to [employer], singing.    Social Determinants of Health   Financial Resource Strain: Not on file  Food Insecurity: Not on file  Transportation Needs: Not on file  Physical Activity: Not on file  Stress: Not on file  Social Connections: Not on file     Family History: The patient's family history includes Colon cancer in her maternal aunt; Coronary artery disease in her sister; Coronary artery disease (age of onset: 27) in her sister; Heart attack (age of onset: 26) in her mother; Heart disease in her brother and mother; Heart disease (age of onset: 10) in her father; Hypertension in her mother; Prostate cancer in her father. There is no history of Colon polyps, Esophageal cancer, Rectal cancer, or Stomach cancer.  ROS:   Please see the history of present illness.     All other systems reviewed and are negative.  EKGs/Labs/Other Studies Reviewed:    The following studies were reviewed today:  Echo 07/12/20  1. Left ventricular ejection fraction, by estimation, is 60 to 65%. The  left ventricle has normal function. The left ventricle has no regional  wall motion abnormalities. Left ventricular diastolic parameters are  consistent with Grade II diastolic  dysfunction (pseudonormalization). The average left ventricular global  longitudinal strain is -15.5 %.   2. Right ventricular systolic function is normal. The right ventricular  size is normal.   3. Left atrial size was mildly dilated.   Vas US carotid duplex 08/23/20 Summary:  Right Carotid: There is no evidence of stenosis in the right ICA.                 Non-hemodynamically significant plaque <50% noted in the  CCA. The                 ECA appears <50% stenosed. Brachial artery waveform is  triphasic                 and brisk.   Left Carotid: There is no evidence of stenosis in the left ICA.                Non-hemodynamically significant plaque <50% noted in the  CCA. The                ECA  appears <50% stenosed. Brachial artery waveform is  triphasic                and brisk.   Vertebrals: Left vertebral artery demonstrates antegrade flow. Right  vertebral  artery demonstrates bidirectional flow.   EKG:  EKG is  ordered today.  The ekg ordered today demonstrates NSR, 70bpm, TWI V6, first degree AV block  Recent Labs: 07/11/2020: B Natriuretic Peptide 53.2 07/12/2020: Magnesium 2.0; TSH 2.849 10/18/2020: ALT 13 12/13/2020: BUN 15; Creatinine, Ser 0.83; Hemoglobin 12.4; Platelets 221; Potassium 4.1; Sodium 140  Recent Lipid Panel    Component Value Date/Time   CHOL 108 10/18/2020 1038   TRIG 43.0 10/18/2020 1038   HDL 45.60 10/18/2020 1038   CHOLHDL 2 10/18/2020 1038   VLDL 8.6 10/18/2020 1038   LDLCALC 54 10/18/2020 1038     Physical Exam:    VS:  BP 130/82 (BP Location: Left Arm, Patient Position: Sitting, Cuff Size: Normal)   Pulse 70   Ht 5\' 5"  (1.651 m)   Wt 284 lb 8 oz (129 kg)   SpO2 98%   BMI 47.34 kg/m     Wt Readings from Last 3 Encounters:  12/13/20 284 lb 8 oz (129 kg)  12/13/20 283 lb 12.8 oz (128.7 kg)  10/18/20 282 lb (127.9 kg)     GEN:  Well nourished, well developed in no acute distress HEENT: Normal NECK: No JVD; No carotid bruits LYMPHATICS: No lymphadenopathy CARDIAC: RRR, no murmurs, rubs, gallops RESPIRATORY:  Clear to auscultation without rales, wheezing or rhonchi  ABDOMEN: Soft, non-tender, non-distended MUSCULOSKELETAL:  No edema; No deformity  SKIN: Warm and dry NEUROLOGIC:  Alert and oriented x 3 PSYCHIATRIC:  Normal affect   ASSESSMENT:    1. SOB (shortness of breath)   2. Acute pulmonary embolism, unspecified pulmonary embolism type, unspecified whether acute cor pulmonale present ([place])   3. Essential hypertension   4. Aortic atherosclerosis ([place])   5. Bruit of left carotid artery    PLAN:    In order of problems listed above:  Shortness of breath H/o PE Reports SOB that started a week ago, began  started after coming back from traveling. O2 is good and heart rate is normal. She has history of PE and was on Eliquis for 3 months. She is no longer on Eliquis. Reports LLE, but appears to be dependent. No chest pain. Possible body habitus and general deconditioning contributing to shortness of breath. Will check a d-dimer, BMET, and CBC. IF D-dimer is elevated will refer to ER. She has apt with PCP coming up soon.   HTN BP today reasonable. Continue Losartan-HCTA 100-12.5mg  daily, verapamil 360mg  daily  Carotid/subclavian disease Carotid US 08/2020 showed mild plaque bilaterally, less than 50%, no significant changes. Continue statin.  Aortic atherosclerosis Continue statin. No chest pain, sob as above. Further work-up pending labs.   Disposition: Follow up in 3 month(s) with MD/APP    Signed, Tarena Gockley Ninfa Meeker, PA-C  12/13/2020 3:33 PM    [facility]

## 2020-12-12 ENCOUNTER — Telehealth: Payer: Self-pay

## 2020-12-12 ENCOUNTER — Ambulatory Visit: Payer: Federal, State, Local not specified - PPO | Admitting: Primary Care

## 2020-12-12 NOTE — Telephone Encounter (Signed)
I have called pt back and LM on VM

## 2020-12-12 NOTE — Telephone Encounter (Signed)
Called and LVM for patient in regards to give [place] a call back in regards to if she has ever had a sleep study before.

## 2020-12-12 NOTE — Telephone Encounter (Signed)
Patient is returning phone call. Patient phone number is [phone].

## 2020-12-13 ENCOUNTER — Emergency Department: Payer: Medicare Other

## 2020-12-13 ENCOUNTER — Encounter: Payer: Self-pay | Admitting: Pulmonary Disease

## 2020-12-13 ENCOUNTER — Ambulatory Visit (INDEPENDENT_AMBULATORY_CARE_PROVIDER_SITE_OTHER): Payer: Medicare Other | Admitting: Medical

## 2020-12-13 ENCOUNTER — Telehealth: Payer: Self-pay | Admitting: Cardiovascular Disease

## 2020-12-13 ENCOUNTER — Other Ambulatory Visit: Payer: Self-pay

## 2020-12-13 ENCOUNTER — Other Ambulatory Visit
Admission: RE | Admit: 2020-12-13 | Discharge: 2020-12-13 | Disposition: A | Discharge status: Home / Self Care | Payer: Medicare Other | Attending: Medical | Admitting: Medical

## 2020-12-13 ENCOUNTER — Emergency Department
Admission: EM | Admit: 2020-12-13 | Discharge: 2020-12-13 | Disposition: A | Discharge status: Home / Self Care | Payer: Medicare Other | Attending: Emergency Medicine | Admitting: Emergency Medicine

## 2020-12-13 ENCOUNTER — Ambulatory Visit (INDEPENDENT_AMBULATORY_CARE_PROVIDER_SITE_OTHER): Payer: Medicare Other | Admitting: Pulmonary Disease

## 2020-12-13 ENCOUNTER — Encounter: Payer: Self-pay | Admitting: Medical

## 2020-12-13 VITALS — BP 130/82 | HR 70 | Ht 65.0 in | Wt 284.5 lb

## 2020-12-13 VITALS — BP 144/88 | HR 76 | Temp 97.9°F | Ht 65.0 in | Wt 283.8 lb

## 2020-12-13 DIAGNOSIS — Z79899 Other long term (current) drug therapy: Secondary | ICD-10-CM | POA: Diagnosis not present

## 2020-12-13 DIAGNOSIS — Z87891 Personal history of nicotine dependence: Secondary | ICD-10-CM | POA: Insufficient documentation

## 2020-12-13 DIAGNOSIS — R0602 Shortness of breath: Secondary | ICD-10-CM

## 2020-12-13 DIAGNOSIS — I10 Essential (primary) hypertension: Secondary | ICD-10-CM

## 2020-12-13 DIAGNOSIS — R2243 Localized swelling, mass and lump, lower limb, bilateral: Secondary | ICD-10-CM | POA: Diagnosis not present

## 2020-12-13 DIAGNOSIS — I700 Atherosclerosis of aorta: Secondary | ICD-10-CM | POA: Diagnosis not present

## 2020-12-13 DIAGNOSIS — R0989 Other specified symptoms and signs involving the circulatory and respiratory systems: Secondary | ICD-10-CM

## 2020-12-13 DIAGNOSIS — R0683 Snoring: Secondary | ICD-10-CM | POA: Diagnosis not present

## 2020-12-13 DIAGNOSIS — I2699 Other pulmonary embolism without acute cor pulmonale: Secondary | ICD-10-CM | POA: Diagnosis not present

## 2020-12-13 LAB — BASIC METABOLIC PANEL
Anion gap: 8 (ref 5–15)
BUN: 15 mg/dL (ref 8–23)
CO2: 26 mmol/L (ref 22–32)
Calcium: 9.7 mg/dL (ref 8.9–10.3)
Chloride: 106 mmol/L (ref 98–111)
Creatinine, Ser: 0.83 mg/dL (ref 0.44–1.00)
GFR, Estimated: >60 mL/min (ref >60)
Glucose, Bld: 99 mg/dL (ref 70–99)
Potassium: 4.1 mmol/L (ref 3.5–5.1)
Sodium: 140 mmol/L (ref 135–145)

## 2020-12-13 LAB — CBC
HCT: 38.2 % (ref 36.0–46.0)
Hemoglobin: 12.4 g/dL (ref 12.0–15.0)
MCH: 27.7 pg (ref 26.0–34.0)
MCHC: 32.5 g/dL (ref 30.0–36.0)
MCV: 85.3 fL (ref 80.0–100.0)
Platelets: 221 10*3/uL (ref 150–400)
RBC: 4.48 MIL/uL (ref 3.87–5.11)
RDW: 14.6 % (ref 11.5–15.5)
WBC: 5.4 10*3/uL (ref 4.0–10.5)
nRBC: 0 % (ref 0.0–0.2)

## 2020-12-13 LAB — COMPREHENSIVE METABOLIC PANEL
ALT: 31 U/L (ref 0–44)
AST: 30 U/L (ref 15–41)
Albumin: 3.8 g/dL (ref 3.5–5.0)
Alkaline Phosphatase: 63 U/L (ref 38–126)
Anion gap: 8 (ref 5–15)
BUN: 15 mg/dL (ref 8–23)
CO2: 25 mmol/L (ref 22–32)
Calcium: 9.5 mg/dL (ref 8.9–10.3)
Chloride: 105 mmol/L (ref 98–111)
Creatinine, Ser: 0.86 mg/dL (ref 0.44–1.00)
GFR, Estimated: >60 mL/min (ref >60)
Glucose, Bld: 119 mg/dL — ABNORMAL HIGH (ref 70–99)
Potassium: 3.7 mmol/L (ref 3.5–5.1)
Sodium: 138 mmol/L (ref 135–145)
Total Bilirubin: 0.7 mg/dL (ref 0.3–1.2)
Total Protein: 7.5 g/dL (ref 6.5–8.1)

## 2020-12-13 LAB — CBC WITH DIFFERENTIAL/PLATELET
Abs Immature Granulocytes: 0.01 10*3/uL (ref 0.00–0.07)
Basophils Absolute: 0 10*3/uL (ref 0.0–0.1)
Basophils Relative: 1 %
Eosinophils Absolute: 0.3 10*3/uL (ref 0.0–0.5)
Eosinophils Relative: 5 %
HCT: 38.9 % (ref 36.0–46.0)
Hemoglobin: 13 g/dL (ref 12.0–15.0)
Immature Granulocytes: 0 %
Lymphocytes Relative: 39 %
Lymphs Abs: 2.1 10*3/uL (ref 0.7–4.0)
MCH: 28.4 pg (ref 26.0–34.0)
MCHC: 33.4 g/dL (ref 30.0–36.0)
MCV: 85.1 fL (ref 80.0–100.0)
Monocytes Absolute: 0.5 10*3/uL (ref 0.1–1.0)
Monocytes Relative: 10 %
Neutro Abs: 2.5 10*3/uL (ref 1.7–7.7)
Neutrophils Relative %: 45 %
Platelets: 237 10*3/uL (ref 150–400)
RBC: 4.57 MIL/uL (ref 3.87–5.11)
RDW: 14.7 % (ref 11.5–15.5)
WBC: 5.5 10*3/uL (ref 4.0–10.5)
nRBC: 0 % (ref 0.0–0.2)

## 2020-12-13 LAB — TROPONIN I (HIGH SENSITIVITY)
Troponin I (High Sensitivity): 8 ng/L (ref <18)
Troponin I (High Sensitivity): 8 ng/L (ref <18)

## 2020-12-13 LAB — BRAIN NATRIURETIC PEPTIDE: B Natriuretic Peptide: 24.7 pg/mL (ref 0.0–100.0)

## 2020-12-13 LAB — D-DIMER, QUANTITATIVE: D-Dimer, Quant: 0.87 ug/mL-FEU — ABNORMAL HIGH (ref 0.00–0.50)

## 2020-12-13 MED ORDER — IOHEXOL 350 MG/ML SOLN
75.0000 mL | Freq: Once | INTRAVENOUS | Status: AC | PRN
  Administered 2020-12-13: 75 mL via INTRAVENOUS

## 2020-12-13 MED ORDER — IPRATROPIUM-ALBUTEROL 0.5-2.5 (3) MG/3ML IN SOLN
3.0000 mL | Freq: Once | RESPIRATORY_TRACT | Status: AC
  Administered 2020-12-13: 3 mL via RESPIRATORY_TRACT
  Filled 2020-12-13: qty 3

## 2020-12-13 NOTE — Progress Notes (Signed)
[facility]  Chief Complaint  Patient presents with   sleep consult    No prior sleep study. C/o loud snoring and daytime sleepiness.    Past Surgical History:  She  has a past surgical history that includes Colonoscopy (2010) and Hysteroscopy with D & C (N/A, 07/27/2019).  Past Medical History:  Allergies, GERD, HTN, Iron deficient anemia, Vit D deficiency, PE  Constitutional:  BP (!) 144/88 (BP Location: Left Arm, Cuff Size: Large)   Pulse 76   Temp 97.9 F (36.6 C) (Temporal)   Ht 5\' 5"  (1.651 m)   Wt 283 lb 12.8 oz (128.7 kg)   SpO2 99%   BMI 47.23 kg/m   Brief Summary:  Paige Hester is a 65 y.o. female with snoring.      Subjective:   She has been concerned about her sleep.  This has been going on for years.  She snores and has been told that stops breathing while asleep.  She is a restless sleeper. She is tired all the time, and can fall asleep when watching TV.  She goes to sleep between 11 pm and 2 am.  She falls asleep in 30 to 90 minutes.  She wakes up 4 or 5 times to use the bathroom.  She gets out of bed between 9 am and noon.  She feels tired in the morning.  She denies morning headache.  She drinks a cup of coffee in the morning.  She uses melatonin at night, but doesn't seem to help much with her sleep.  She will sometimes talk in her sleep.  She denies sleep walking, bruxism, or nightmares.  There is no history of restless legs.  She denies sleep hallucinations, sleep paralysis, or cataplexy.  The Epworth score is 16 out of 24.   Physical Exam:   Appearance - well kempt   ENMT - no sinus tenderness, no oral exudate, no LAN, Mallampati 3 airway, no stridor  Respiratory - equal breath sounds bilaterally, no wheezing or rales  CV - s1s2 regular rate and rhythm, 2/6 murmur  Ext - no clubbing, no edema  Skin - no rashes  Psych - normal mood and affect   Chest Imaging:  CT angio chest 07/11/20 >>  isolated subsegmental PE in LUL, PA 3.5 cm, mild CM, coronary calcifications  Sleep Tests:    Cardiac Tests:  Echo 07/12/20 >> EF 60 to 65%, grade 2 DD, mild LA dilation  Social History:  She  reports that she quit smoking about 23 years ago. Her smoking use included cigarettes. She has a 27.00 pack-year smoking history. She has never used smokeless tobacco. She reports current alcohol use of about 7.0 standard drinks per week. She reports that she does not use drugs.  Family History:  Her family history includes Colon cancer in her maternal aunt; Coronary artery disease in her sister; Heart attack (age of onset: 73) in her mother; Heart disease in her brother and mother; Heart disease (age of onset: 2) in her father; Hypertension in her mother; Prostate cancer in her father.    Discussion:  She has snoring, sleep disruption, apnea, and daytime sleepiness.  She has history of hypertension.  She has family history of sleep apnea.  Her BMI is > 35.  I am concerned she could have obstructive sleep apnea.  Assessment/Plan:   Snoring with excessive daytime sleepiness. - will need to arrange for a home sleep study  Insomnia. - explained  how this could actually be related to sleep apnea with frequent arousals from apnea episodes - okay to continue melatonin at night for now, and reassess after review of her sleep study  Coronary calcification, HLD, chronic diastolic CHF, Murmur, Pulmonary embolism. - followed by Dr. Esmond Plants with [place]  Obesity. - discussed how weight can impact sleep and risk for sleep disordered breathing - discussed options to assist with weight loss: combination of diet modification, cardiovascular and strength training exercises  Cardiovascular risk. - had an extensive discussion regarding the adverse health consequences related to untreated sleep disordered breathing - specifically discussed the risks for hypertension, coronary artery disease, cardiac  dysrhythmias, cerebrovascular disease, and diabetes - lifestyle modification discussed  Safe driving practices. - discussed how sleep disruption can increase risk of accidents, particularly when driving - safe driving practices were discussed  Therapies for obstructive sleep apnea. - if the sleep study shows significant sleep apnea, then various therapies for treatment were reviewed: CPAP, oral appliance, and surgical interventions   Time Spent Involved in Patient Care on Day of Examination:  34 minutes  Follow up:   Patient Instructions  Will arrange for home sleep study Will call to arrange for follow up after sleep study reviewed  Medication List:   Allergies as of 12/13/2020   No Known Allergies      Medication List        Accurate as of December 13, 2020 10:06 AM. If you have any questions, ask your nurse or doctor.          STOP taking these medications    apixaban 5 MG Tabs tablet Commonly known as: ELIQUIS Stopped by: Chesley Mires, MD       TAKE these medications    albuterol 108 (90 Base) MCG/ACT inhaler Commonly known as: VENTOLIN HFA Inhale 2 puffs into the lungs every 6 (six) hours as needed for wheezing or shortness of breath.   cholecalciferol 25 MCG (1000 UNIT) tablet Commonly known as: VITAMIN D Take 1,000 Units by mouth daily.   docusate sodium 100 MG capsule Commonly known as: COLACE Take 1 capsule (100 mg total) by mouth 2 (two) times daily as needed for mild constipation or moderate constipation.   fluticasone 50 MCG/ACT nasal spray Commonly known as: FLONASE Place 1 spray into both nostrils 2 (two) times daily as needed for allergies or rhinitis.   loratadine 10 MG tablet Commonly known as: CLARITIN Take 10 mg by mouth daily.   losartan-hydrochlorothiazide 100-12.5 MG tablet Commonly known as: HYZAAR Take 1 tablet by mouth daily. For blood pressure.   megestrol 40 MG tablet Commonly known as: MEGACE TAKE 1 TABLET BY MOUTH  TWICE DAILY CAN  INCREASE  TO  2  TABLETS  TWICE  DAILY  IN  THE  EVENT  OF  HEAVY  BLEEDING   Melatonin 10 MG Tabs Take by mouth as needed.   omeprazole 20 MG capsule Commonly known as: PRILOSEC Take 20 mg by mouth daily.   Potassium 99 MG Tabs Take 1 tablet by mouth daily.   rosuvastatin 20 MG tablet Commonly known as: Crestor Take 1 tablet (20 mg total) by mouth daily.   verapamil 360 MG 24 hr capsule Commonly known as: VERELAN PM Take 1 capsule (360 mg total) by mouth at bedtime.        Signature:  Chesley Mires, MD [place] Pager - [phone] 12/13/2020, 10:06 AM

## 2020-12-13 NOTE — ED Provider Notes (Signed)
[facility]-EMERGENCY DEPARTMENT  ____________________________________________  Time seen: Approximately 8:52 PM  I have reviewed the triage vital signs and the nursing notes.   HISTORY  Chief Complaint Shortness of Breath and Edema   Historian Patient    HPI Paige Hester is a 65 y.o. female with a history of DVT and PE, presents to the emergency department with exertional shortness of breath that started after patient visited her granddaughter in [place].  Patient states that she was at the airport when she first noticed shortness of breath and would have to stop every 10 or so steps in order to catch her breath.  She denies associated chest pain or chest tightness.  Denies recent cough or viral URI-like symptoms.  Denies falls or mechanisms of trauma.  Denies pain in her upper back.  She has noticed some swelling in the bilateral lower extremities.  Given patient's history of thrombus formation, patient became concerned for DVT and PE and was referred to the emergency department for DVT and PE rule out.   Past Medical History:  Diagnosis Date   Allergy    Gallbladder polyp 2012   GERD (gastroesophageal reflux disease)    Hypertension    Iron deficiency anemia    Menopausal symptoms    since 2007   Murmur, cardiac    Pulmonary embolism ([place]) 06/2020   Vitamin D deficiency      Immunizations up to date:  Yes.     Past Medical History:  Diagnosis Date   Allergy    Gallbladder polyp 2012   GERD (gastroesophageal reflux disease)    Hypertension    Iron deficiency anemia    Menopausal symptoms    since 2007   Murmur, cardiac    Pulmonary embolism ([place]) 06/2020   Vitamin D deficiency     Patient Active Problem List   Diagnosis Date Noted   Snoring 09/26/2020   Pulmonary hypertension ([place]) 07/19/2020   Aortic atherosclerosis ([place]) 07/19/2020   Hyperlipidemia 07/19/2020   Pulmonary embolism ([place]) 07/11/2020   Cardiomegaly 07/11/2020   Endometrial thickness of  18 mm and associated bleeding in postmenopausal patient 07/27/2019   Mild dysplasia of cervix ([facility] [facility]) 10/12/2018   Postmenopausal bleeding 08/16/2018   Prediabetes 11/04/2016   BPPV (benign paroxysmal positional vertigo) 12/01/2014   Preventative health care 08/30/2014   Cough 08/04/2014   Morbid obesity ([place]) 08/10/2013   GERD (gastroesophageal reflux disease) 05/31/2012   Ulnar tunnel syndrome 07/02/2010   MURMUR, CARDIAC, UNDIAGNOSED 09/20/2008   URI (upper respiratory infection) 02/05/2008   ECZEMA 02/16/2007   Essential hypertension 12/19/2006    Past Surgical History:  Procedure Laterality Date   COLONOSCOPY  2010   2020   HYSTEROSCOPY WITH D & C N/A 07/27/2019   Procedure: DILATATION AND CURETTAGE /HYSTEROSCOPY, Polypectomy;  Surgeon: Osborne Oman, MD;  Location: [facility];  Service: Gynecology;  Laterality: N/A;    Prior to Admission medications   Medication Sig Start Date End Date Taking? Authorizing Provider  albuterol (VENTOLIN HFA) 108 (90 Base) MCG/ACT inhaler Inhale 2 puffs into the lungs every 6 (six) hours as needed for wheezing or shortness of breath. 07/11/20   Merlyn Lot, MD  Cholecalciferol (VITAMIN D3) 1000 UNITS tablet Take 1,000 Units by mouth daily.    [provider]  docusate sodium (COLACE) 100 MG capsule Take 1 capsule (100 mg total) by mouth 2 (two) times daily as needed for mild constipation or moderate constipation. 07/27/19   Osborne Oman, MD  fluticasone (FLONASE) 50 MCG/ACT nasal spray Place 1 spray into both nostrils 2 (two) times daily as needed for allergies or rhinitis. 09/08/19   Pleas Koch, NP  loratadine (CLARITIN) 10 MG tablet Take 10 mg by mouth daily.    [provider]  losartan-hydrochlorothiazide (HYZAAR) 100-12.5 MG tablet Take 1 tablet by mouth daily. For blood pressure. 10/18/20   Pleas Koch, NP  megestrol (MEGACE) 40 MG tablet TAKE 1 TABLET BY MOUTH TWICE DAILY CAN  INCREASE   TO  2  TABLETS  TWICE  DAILY  IN  THE  EVENT  OF  HEAVY  BLEEDING 10/30/20   Anyanwu, Sallyanne Havers, MD  Melatonin 10 MG TABS Take by mouth as needed.    [provider]  omeprazole (PRILOSEC) 20 MG capsule Take 20 mg by mouth daily.    [provider]  Potassium 99 MG TABS Take 1 tablet by mouth daily.    [provider]  rosuvastatin (CRESTOR) 20 MG tablet Take 1 tablet (20 mg total) by mouth daily. 08/16/20   Minna Merritts, MD  verapamil (VERELAN PM) 360 MG 24 hr capsule Take 1 capsule (360 mg total) by mouth at bedtime. 12/26/19   Pleas Koch, NP    Allergies Patient has no known allergies.  Family History  Problem Relation Age of Onset   Heart disease Mother    Hypertension Mother    Heart attack Mother 24   Prostate cancer Father    Heart disease Father 42       CAD, STENT   Coronary artery disease Sister        stent x 2   Coronary artery disease Sister 2       stent placed   Heart disease Brother        Myocardial infarction   Colon cancer Maternal Aunt    Colon polyps Neg Hx    Esophageal cancer Neg Hx    Rectal cancer Neg Hx    Stomach cancer Neg Hx     Social History Social History   Tobacco Use   Smoking status: Former    Packs/day: 1.00    Years: 27.00    Pack years: 27.00    Types: Cigarettes    Quit date: 1999    Years since quitting: 23.7   Smokeless tobacco: Never  Vaping Use   Vaping Use: Never used  Substance Use Topics   Alcohol use: Yes    Alcohol/week: 7.0 standard drinks    Types: 7 Glasses of wine per week   Drug use: No     Review of Systems  Constitutional: No fever/chills Eyes:  No discharge ENT: No upper respiratory complaints. Respiratory: no cough. Patient has shortness of breath.  Gastrointestinal:   No nausea, no vomiting.  No diarrhea.  No constipation. Musculoskeletal: Negative for musculoskeletal pain. Skin: Negative for rash, abrasions, lacerations,  ecchymosis.    ____________________________________________   PHYSICAL EXAM:  VITAL SIGNS: ED Triage Vitals  Enc Vitals Group     BP 12/13/20 1755 (!) 161/74     Pulse Rate 12/13/20 1755 78     Resp 12/13/20 1755 18     Temp 12/13/20 1800 99 F (37.2 C)     Temp Source 12/13/20 1755 Oral     SpO2 12/13/20 1755 96 %     Weight 12/13/20 1756 284 lb (128.8 kg)     Height 12/13/20 1756 5\' 5"  (1.651 m)     Head Circumference --  Peak Flow --      Pain Score 12/13/20 1756 0     Pain Loc --      Pain Edu? --      Excl. in [place]? --      Constitutional: Alert and oriented. Well appearing and in no acute distress. Eyes: Conjunctivae are normal. PERRL. EOMI. Head: Atraumatic. ENT:      Nose: No congestion/rhinnorhea.      Mouth/Throat: Mucous membranes are moist.  Neck: No stridor.  No cervical spine tenderness to palpation. Cardiovascular: Normal rate, regular rhythm. Normal S1 and S2.  Good peripheral circulation. Respiratory: Normal respiratory effort without tachypnea or retractions. Lungs CTAB. Good air entry to the bases with no decreased or absent breath sounds Gastrointestinal: Bowel sounds x 4 quadrants. Soft and nontender to palpation. No guarding or rigidity. No distention. Musculoskeletal: Full range of motion to all extremities. No obvious deformities noted Neurologic:  Normal for age. No gross focal neurologic deficits are appreciated.  Skin:  Skin is warm, dry and intact. No rash noted. Psychiatric: Mood and affect are normal for age. Speech and behavior are normal.   ____________________________________________   LABS (all labs ordered are listed, but only abnormal results are displayed)  Labs Reviewed  COMPREHENSIVE METABOLIC PANEL - Abnormal; Notable for the following components:      Result Value   Glucose, Bld 119 (*)    All other components within normal limits  CBC WITH DIFFERENTIAL/PLATELET  BRAIN NATRIURETIC PEPTIDE  URINALYSIS, ROUTINE W REFLEX  MICROSCOPIC  TROPONIN I (HIGH SENSITIVITY)  TROPONIN I (HIGH SENSITIVITY)   ____________________________________________  EKG   ____________________________________________  RADIOLOGY Unk Pinto, personally viewed and evaluated these images (plain radiographs) as part of my medical decision making, as well as reviewing the written report by the radiologist.  CT Angio Chest PE W and/or Wo Contrast  Result Date: 12/13/2020 CLINICAL DATA:  Rule out PE. Elevated D-dimer. Shortness of breath with bilateral lower extremity edema EXAM: CT ANGIOGRAPHY CHEST WITH CONTRAST TECHNIQUE: Multidetector CT imaging of the chest was performed using the standard protocol during bolus administration of intravenous contrast. Multiplanar CT image reconstructions and MIPs were obtained to evaluate the vascular anatomy. CONTRAST:  48mL OMNIPAQUE IOHEXOL 350 MG/ML SOLN COMPARISON:  07/11/2020 FINDINGS: Cardiovascular: Satisfactory opacification of the pulmonary arteries to the segmental level. No evidence of pulmonary embolism. Normal heart size. No pericardial effusion. Aortic atherosclerosis. Coronary artery atherosclerotic calcifications noted. Mediastinum/Nodes: No enlarged mediastinal, hilar, or axillary lymph nodes. Thyroid gland, trachea, and esophagus demonstrate no significant findings. Lungs/Pleura: No pleural effusion. Bilateral lung nodules are identified including: 5 mm subpleural nodule in the periphery of the right lower lobe, image 54/6. New compared with the previous exam. Superior segment of right lower lobe lung nodule measures 5 mm, image 35/6. New from previous exam. Left lower lobe lung nodule measures 3 mm, image 57/6. Also new. Additional small less than 5 mm lung nodules are identified and are also new compared with the previous exam. Upper Abdomen: No acute abnormality. Right lobe of liver cyst measures 2.6 cm. Several additional subcentimeter low-density foci are again seen within the liver  which are too small to reliably characterize but appears similar to previous exam likely representing small cysts. Musculoskeletal: No chest wall abnormality. No acute or significant osseous findings. Review of the MIP images confirms the above findings. IMPRESSION: 1. No evidence for acute pulmonary embolus. 2. In the absence of a known malignancy findings are favored to represent a benign inflammatory  or infectious process. According to consensus criteria no follow-up needed if patient is low-risk (and has no known or suspected primary neoplasm). Non-contrast chest CT can be considered in 12 months if patient is high-risk. This recommendation follows the consensus statement: Guidelines for Management of Incidental Pulmonary Nodules Detected on CT Images: From the Fleischner Society 2017; Radiology 2017; 284:228-243. 3. Coronary artery calcifications noted. 4. Aortic Atherosclerosis (ICD10-I70.0). Electronically Signed   By: Kerby Moors M.D.   On: 12/13/2020 18:59   US Venous Img Lower Bilateral  Result Date: 12/13/2020 CLINICAL DATA:  Leg swelling EXAM: Bilateral LOWER EXTREMITY VENOUS DOPPLER ULTRASOUND TECHNIQUE: Gray-scale sonography with compression, as well as color and duplex ultrasound, were performed to evaluate the deep venous system(s) from the level of the common femoral vein through the popliteal and proximal calf veins. COMPARISON:  07/11/2020 FINDINGS: VENOUS Normal compressibility of the common femoral, superficial femoral, and popliteal veins, as well as the visualized calf veins. Visualized portions of profunda femoral vein and great saphenous vein unremarkable. No filling defects to suggest DVT on grayscale or color Doppler imaging. Doppler waveforms show normal direction of venous flow, normal respiratory plasticity and response to augmentation. Limited views of the contralateral common femoral vein are unremarkable. OTHER None. Limitations: none IMPRESSION: Negative. Electronically  Signed   By: Donavan Foil M.D.   On: 12/13/2020 19:53    ____________________________________________    PROCEDURES  Procedure(s) performed:     Procedures     Medications  iohexol (OMNIPAQUE) 350 MG/ML injection 75 mL (75 mLs Intravenous Contrast Given 12/13/20 1813)  ipratropium-albuterol (DUONEB) 0.5-2.5 (3) MG/3ML nebulizer solution 3 mL (3 mLs Nebulization Given 12/13/20 2110)     ____________________________________________   INITIAL IMPRESSION / ASSESSMENT AND PLAN / ED COURSE  Pertinent labs & imaging results that were available during my care of the patient were reviewed by me and considered in my medical decision making (see chart for details).      Assessment and plan: Shortness of breath 65 year old female presents to the emergency department with exertional shortness of breath since patient was traveling to [place].  Patient was hypertensive at triage but vital signs were otherwise reassuring.  She had a normal respiratory rate and was satting at 96% on room air.  On physical exam, patient was able to speak in complete sentences and had no adventitious lung sounds auscultated.  There was no evidence of PE on CTA and no evidence of DVT on bilateral venous ultrasounds.  There were bilateral pulmonary nodules visualized on CTA and radiologist potentially recommended obtaining a CT chest without contrast in 1 year to monitor nodules.  Patient has no history of CHF.  Patient did have 1+ pitting edema bilaterally.  Her BNP was notably reassuring at 24.7.  Will obtain repeat troponin and will give DuoNeb to assess for clinical improvement in shortness of breath and will reassess.  Repeat troponin was within reference range.  Patient states that she felt no difference after DuoNeb.  The patient is appropriate with follow-up with her primary care provider at this time as well as cardiology.  Patient was given a referral to pulmonology as she did have bilateral pulmonary  nodules visualized on CTA.   Return precautions were given to return with new or worsening symptoms.  All patient questions were answered.    ____________________________________________  FINAL CLINICAL IMPRESSION(S) / ED DIAGNOSES  Final diagnoses:  Shortness of breath      NEW MEDICATIONS STARTED DURING THIS VISIT:  ED Discharge Orders  None           This chart was dictated using voice recognition software/Dragon. Despite best efforts to proofread, errors can occur which can change the meaning. Any change was purely unintentional.     Lannie Fields, PA-C 12/13/20 2210    Harvest Dark, MD 12/16/20 [phone]

## 2020-12-13 NOTE — Discharge Instructions (Addendum)
Your CTA did show some pulmonary nodules bilaterally.  I would like for you to follow-up with pulmonology in 1 year to monitor these nodules.  Your primary care provider might also be able to manage the monitoring process for these nodules.

## 2020-12-13 NOTE — ED Triage Notes (Signed)
Pt states she was sent over by PCP for elevated d-dimer. Pt c/o increased SOB with BL LE edema. Worse with exertion

## 2020-12-13 NOTE — Telephone Encounter (Signed)
Spoke to patient and discussed the elevated D-dimer and with her history of a PE, and her recent travel, the recommendation is for her to go to the ER for further evaluation to r/o a DVT, or PE. Patient verbalized understanding and agreed with plan.

## 2020-12-13 NOTE — Patient Instructions (Addendum)
Medication Instructions:  Your physician recommends that you continue on your current medications as directed. Please refer to the Current Medication list given to you today.  *If you need a refill on your cardiac medications before your next appointment, please call your pharmacy*   Lab Work: D-Dimer *Stat*, Cbc, Bmp- To be done at the medical mall   If you have labs (blood work) drawn today and your tests are completely normal, you will receive your results only by: [place] (if you have MyChart) OR A paper copy in the mail If you have any lab test that is abnormal or we need to change your treatment, we will call you to review the results.   Testing/Procedures: None ordered    Follow-Up: At [facility], you and your health needs are our priority.  As part of our continuing mission to provide you with exceptional heart care, we have created designated Provider Care Teams.  These Care Teams include your primary Cardiologist (physician) and Advanced Practice Providers (APPs -  Physician Assistants and Nurse Practitioners) who all work together to provide you with the care you need, when you need it.  We recommend signing up for the patient portal called "MyChart".  Sign up information is provided on this After Visit Summary.  MyChart is used to connect with patients for Virtual Visits (Telemedicine).  Patients are able to view lab/test results, encounter notes, upcoming appointments, etc.  Non-urgent messages can be sent to your provider as well.   To learn more about what you can do with MyChart, go to [email or web address].    Your next appointment:   3 month(s)  The format for your next appointment:   In Person  Provider:   You may see Dr. Ida Rogue or one of the following Advanced Practice Providers on your designated Care Team:   Murray Hodgkins, NP Christell Faith, PA-C Marrianne Mood, PA-C Cadence Kathlen Mody, [place]   Other Instructions None

## 2020-12-13 NOTE — Telephone Encounter (Signed)
Patient calling Provider left VM with lab results stating she needs to go to ER Patient calling for clarification

## 2020-12-13 NOTE — ED Provider Notes (Signed)
HPI: Pt is a 65 y.o. female who presents with complaints of shortness of breath   The patient p/w  sob seen [place] [facility] and had elevated ddimer also bilateral leg swelling, recently off eliquis. Recent covid 2 months ago.   ROS: Denies fever, chest pain, vomiting  Past Medical History:  Diagnosis Date   Allergy    Gallbladder polyp 2012   GERD (gastroesophageal reflux disease)    Hypertension    Iron deficiency anemia    Menopausal symptoms    since 2007   Murmur, cardiac    Pulmonary embolism ([place]) 06/2020   Vitamin D deficiency    There were no vitals filed for this visit.  Focused Physical Exam: Gen: No acute distress Head: atraumatic, normocephalic Eyes: Extraocular movements grossly intact; conjunctiva clear CV: RRR Lung: No increased WOB, no stridor GI: ND, no obvious masses Neuro: Alert and awake  Medical Decision Making and Plan: Given the patient's initial medical screening exam, the following diagnostic evaluation has been ordered. The patient will be placed in the appropriate treatment space, once one is available, to complete the evaluation and treatment. I have discussed the plan of care with the patient and I have advised the patient that an ED physician or mid-level practitioner will reevaluate their condition after the test results have been received, as the results may give them additional insight into the type of treatment they may need.   Diagnostics: CT/US   Treatments: none immediately   Vanessa Ehrenberg, MD 12/13/20 1756

## 2020-12-13 NOTE — Patient Instructions (Signed)
Will arrange for home sleep study Will call to arrange for follow up after sleep study reviewed  

## 2020-12-26 ENCOUNTER — Encounter: Payer: Self-pay | Admitting: Primary Care

## 2020-12-26 ENCOUNTER — Ambulatory Visit (INDEPENDENT_AMBULATORY_CARE_PROVIDER_SITE_OTHER): Payer: Medicare Other | Admitting: Primary Care

## 2020-12-26 ENCOUNTER — Other Ambulatory Visit: Payer: Self-pay

## 2020-12-26 DIAGNOSIS — M65322 Trigger finger, left index finger: Secondary | ICD-10-CM | POA: Diagnosis not present

## 2020-12-26 MED ORDER — METHYLPREDNISOLONE ACETATE 80 MG/ML IJ SUSP
80.0000 mg | Freq: Once | INTRAMUSCULAR | Status: AC
  Administered 2020-12-26: 80 mg via INTRAMUSCULAR

## 2020-12-26 NOTE — Patient Instructions (Signed)
It was a pleasure to see you today!  Trigger Finger Trigger finger, also called stenosing tenosynovitis,  is a condition that causes a finger to get stuck in a bent position. Each finger has a tendon, which is a tough, cord-like tissue that connects muscle to bone, and each tendon passes through a tunnel of tissue called a tendon sheath. To move your finger, your tendon needs to glide freely through the sheath. Trigger finger happens when the tendon or the sheath thickens, making it difficult to move your finger. Trigger finger can affect any finger or a thumb. It may affect more than one finger. Mild cases may clear up with rest and medicine. Severe cases require more treatment. What are the causes? Trigger finger is caused by a thickened finger tendon or tendon sheath. The cause of this thickening is not known. What increases the risk? The following factors may make you more likely to develop this condition: Doing activities that require a strong grip. Having rheumatoid arthritis, gout, or diabetes. Being 55-78 years old. Being female. What are the signs or symptoms? Symptoms of this condition include: Pain when bending or straightening your finger. Tenderness or swelling where your finger attaches to the palm of your hand. A lump in the palm of your hand or on the inside of your finger. Hearing a noise like a pop or a snap when you try to straighten your finger. Feeling a catching or locking sensation when you try to straighten your finger. Being unable to straighten your finger. How is this diagnosed? This condition is diagnosed based on your symptoms and a physical exam. How is this treated? This condition may be treated by: Resting your finger and avoiding activities that make symptoms worse. Wearing a finger splint to keep your finger extended. Taking NSAIDs, such as ibuprofen, to relieve pain and swelling. Doing gentle exercises to stretch the finger as told by your health care  provider. Having medicine that reduces swelling and inflammation (steroids) injected into the tendon sheath. Injections may need to be repeated. Having surgery to open the tendon sheath. This may be done if other treatments do not work and you cannot straighten your finger. You may need physical therapy after surgery. Follow these instructions at home: If you have a splint: Wear the splint as told by your health care provider. Remove it only as told by your health care provider. Loosen it if your fingers tingle, become numb, or turn cold and blue. Keep it clean. If the splint is not waterproof: Do not let it get wet. Cover it with a watertight covering when you take a bath or shower. Managing pain, stiffness, and swelling   If directed, apply heat to the affected area as often as told by your health care provider. Use the heat source that your health care provider recommends, such as a moist heat pack or a heating pad. Place a towel between your skin and the heat source. Leave the heat on for 20-30 minutes. Remove the heat if your skin turns bright red. This is especially important if you are unable to feel pain, heat, or cold. You may have a greater risk of getting burned. If directed, put ice on the painful area. To do this: If you have a removable splint, remove it as told by your health care provider. Put ice in a plastic bag. Place a towel between your skin and the bag or between your splint and the bag. Leave the ice on for 20 minutes, 2-3  times a day.  Activity Rest your finger as told by your health care provider. Avoid activities that make the pain worse. Return to your normal activities as told by your health care provider. Ask your health care provider what activities are safe for you. Do exercises as told by your health care provider. Ask your health care provider when it is safe to drive if you have a splint on your hand. General instructions Take over-the-counter and  prescription medicines only as told by your health care provider. Keep all follow-up visits as told by your health care provider. This is important. Contact a health care provider if: Your symptoms are not improving with home care. Summary Trigger finger, also called stenosing tenosynovitis, causes your finger to get stuck in a bent position. This can make it difficult and painful to straighten your finger. This condition develops when a finger tendon or tendon sheath thickens. Treatment may include resting your finger, wearing a splint, and taking medicines. In severe cases, surgery to open the tendon sheath may be needed. This information is not intended to replace advice given to you by your health care provider. Make sure you discuss any questions you have with your health care provider. Document Revised: 07/19/2018 Document Reviewed: 07/19/2018 Elsevier Patient Education  [place].

## 2020-12-26 NOTE — Progress Notes (Signed)
Subjective:    Patient ID: Paige Hester, female    DOB: 04-27-1955, 65 y.o.   MRN: [id number]  HPI  Paige Hester is a very pleasant 65 y.o. female with a history of ulnar tunnel syndrome, hypertension, hyperlipidemia managed on statin, prediabetes who presents today to discuss hand pain.  Her pain is located to the left second digit at the DIP joint with stiffness that began three weeks ago, worse over the last five days. She's noticed the digit getting stuck in the flexed position daily, having to manually straighten up her finger.   She received an injection for "trigger finger" in the past with improvement. Not sure which finger. She denies redness, swelling, skin breakdown.     Review of Systems  Musculoskeletal:  Positive for arthralgias. Negative for joint swelling.  Skin:  Negative for color change.        Past Medical History:  Diagnosis Date   Allergy    Gallbladder polyp 2012   GERD (gastroesophageal reflux disease)    Hypertension    Iron deficiency anemia    Menopausal symptoms    since 2007   Murmur, cardiac    Pulmonary embolism ([place]) 06/2020   Vitamin D deficiency     Social History   Socioeconomic History   Marital status: Legally Separated    Spouse name: Not on file   Number of children: Not on file   Years of education: Not on file   Highest education level: Not on file  Occupational History   Not on file  Tobacco Use   Smoking status: Former    Packs/day: 1.00    Years: 27.00    Pack years: 27.00    Types: Cigarettes    Quit date: 1999    Years since quitting: 23.7   Smokeless tobacco: Never  Vaping Use   Vaping Use: Never used  Substance and Sexual Activity   Alcohol use: Yes    Alcohol/week: 7.0 standard drinks    Types: 7 Glasses of wine per week   Drug use: No   Sexual activity: Yes  Other Topics Concern   Not on file  Social History Narrative   Married.   1 child, 1 grandchild.   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   Enjoys going to [employer], singing.    Social Determinants of Health   Financial Resource Strain: Not on file  Food Insecurity: Not on file  Transportation Needs: Not on file  Physical Activity: Not on file  Stress: Not on file  Social Connections: Not on file  Intimate Partner Violence: Not on file    Past Surgical History:  Procedure Laterality Date   COLONOSCOPY  2010   2020   HYSTEROSCOPY WITH D & C N/A 07/27/2019   Procedure: DILATATION AND CURETTAGE /HYSTEROSCOPY, Polypectomy;  Surgeon: Osborne Oman, MD;  Location: [facility];  Service: Gynecology;  Laterality: N/A;    Family History  Problem Relation Age of Onset   Heart disease Mother    Hypertension Mother    Heart attack Mother 33   Prostate cancer Father    Heart disease Father 39       CAD, STENT   Coronary artery disease Sister        stent x 2   Coronary artery disease Sister 24       stent placed   Heart disease Brother  Myocardial infarction   Colon cancer Maternal Aunt    Colon polyps Neg Hx    Esophageal cancer Neg Hx    Rectal cancer Neg Hx    Stomach cancer Neg Hx     No Known Allergies  Current Outpatient Medications on File Prior to Visit  Medication Sig Dispense Refill   albuterol (VENTOLIN HFA) 108 (90 Base) MCG/ACT inhaler Inhale 2 puffs into the lungs every 6 (six) hours as needed for wheezing or shortness of breath. 8 g 2   Cholecalciferol (VITAMIN D3) 1000 UNITS tablet Take 1,000 Units by mouth daily.     docusate sodium (COLACE) 100 MG capsule Take 1 capsule (100 mg total) by mouth 2 (two) times daily as needed for mild constipation or moderate constipation. 30 capsule 2   fluticasone (FLONASE) 50 MCG/ACT nasal spray Place 1 spray into both nostrils 2 (two) times daily as needed for allergies or rhinitis. 16 g 0   loratadine (CLARITIN) 10 MG tablet Take 10 mg by mouth daily.      losartan-hydrochlorothiazide (HYZAAR) 100-12.5 MG tablet Take 1 tablet by mouth daily. For blood pressure. 90 tablet 0   megestrol (MEGACE) 40 MG tablet TAKE 1 TABLET BY MOUTH TWICE DAILY CAN  INCREASE  TO  2  TABLETS  TWICE  DAILY  IN  THE  EVENT  OF  HEAVY  BLEEDING 60 tablet 0   Melatonin 10 MG TABS Take by mouth as needed.     omeprazole (PRILOSEC) 20 MG capsule Take 20 mg by mouth daily.     Potassium 99 MG TABS Take 1 tablet by mouth daily.     rosuvastatin (CRESTOR) 20 MG tablet Take 1 tablet (20 mg total) by mouth daily. 90 tablet 3   verapamil (VERELAN PM) 360 MG 24 hr capsule Take 1 capsule (360 mg total) by mouth at bedtime. 90 capsule 3   No current facility-administered medications on file prior to visit.    BP (!) 162/88   Pulse 64   Temp 97.8 F (36.6 C) (Temporal)   Ht 5\' 5"  (1.651 m)   Wt 284 lb (128.8 kg)   SpO2 98%   BMI 47.26 kg/m  Objective:   Physical Exam Musculoskeletal:     Right hand: Normal.     Left hand: No swelling, tenderness or bony tenderness. Decreased range of motion. Normal strength.     Comments: Decrease in ROM with flexion to DIP joint of left second digit. No swelling or erythema   Skin:    General: Skin is warm and dry.     Findings: No erythema.  Neurological:     Mental Status: She is alert.          Assessment & Plan:      This visit occurred during the SARS-CoV-2 public health emergency.  Safety protocols were in place, including screening questions prior to the visit, additional usage of staff PPE, and extensive cleaning of exam room while observing appropriate contact time as indicated for disinfecting solutions.

## 2020-12-26 NOTE — Assessment & Plan Note (Signed)
Mild presentation today.  Will provide IM Depo Medrol 80 mg today. If no improvement then recommend she see sports medicine for formal injection.

## 2020-12-27 ENCOUNTER — Ambulatory Visit (INDEPENDENT_AMBULATORY_CARE_PROVIDER_SITE_OTHER): Payer: Medicare Other | Admitting: Primary Care

## 2020-12-27 ENCOUNTER — Encounter: Payer: Self-pay | Admitting: Primary Care

## 2020-12-27 VITALS — BP 146/88 | HR 99 | Temp 97.5°F | Ht 65.0 in | Wt 286.0 lb

## 2020-12-27 DIAGNOSIS — I10 Essential (primary) hypertension: Secondary | ICD-10-CM | POA: Diagnosis not present

## 2020-12-27 DIAGNOSIS — Z86711 Personal history of pulmonary embolism: Secondary | ICD-10-CM

## 2020-12-27 DIAGNOSIS — Z0289 Encounter for other administrative examinations: Secondary | ICD-10-CM

## 2020-12-27 DIAGNOSIS — Z23 Encounter for immunization: Secondary | ICD-10-CM

## 2020-12-27 DIAGNOSIS — R0683 Snoring: Secondary | ICD-10-CM

## 2020-12-27 DIAGNOSIS — Z1231 Encounter for screening mammogram for malignant neoplasm of breast: Secondary | ICD-10-CM

## 2020-12-27 DIAGNOSIS — M65322 Trigger finger, left index finger: Secondary | ICD-10-CM

## 2020-12-27 DIAGNOSIS — Z0000 Encounter for general adult medical examination without abnormal findings: Secondary | ICD-10-CM

## 2020-12-27 DIAGNOSIS — K219 Gastro-esophageal reflux disease without esophagitis: Secondary | ICD-10-CM

## 2020-12-27 DIAGNOSIS — R7303 Prediabetes: Secondary | ICD-10-CM | POA: Diagnosis not present

## 2020-12-27 DIAGNOSIS — N950 Postmenopausal bleeding: Secondary | ICD-10-CM

## 2020-12-27 DIAGNOSIS — E7800 Pure hypercholesterolemia, unspecified: Secondary | ICD-10-CM

## 2020-12-27 DIAGNOSIS — I700 Atherosclerosis of aorta: Secondary | ICD-10-CM

## 2020-12-27 MED ORDER — LOSARTAN POTASSIUM-HCTZ 100-25 MG PO TABS: 1.0000 | ORAL_TABLET | Freq: Every day | ORAL | 0 refills | Status: DC

## 2020-12-27 NOTE — Assessment & Plan Note (Signed)
Following with GYN. Continue Megace PRN.

## 2020-12-27 NOTE — Assessment & Plan Note (Signed)
Above goal today, also with prior outpatient visits.  Increase losartan-HCTZ to 100-25 mg.   She will be seeing another outpatient provider in 2-3 weeks and will have BP rechecked. She will notify me of the reading.

## 2020-12-27 NOTE — Assessment & Plan Note (Signed)
No improvement. She will schedule with Dr. Lorelei Pont upon his return.

## 2020-12-27 NOTE — Assessment & Plan Note (Signed)
A1C from August 2022 reviewed.  Discussed the importance of a healthy diet and regular exercise in order for weight loss, and to reduce the risk of further co-morbidity.  Continue to monitor.

## 2020-12-27 NOTE — Assessment & Plan Note (Signed)
Discussed the importance of a healthy diet and regular exercise in order for weight loss, and to reduce the risk of further co-morbidity.  

## 2020-12-27 NOTE — Patient Instructions (Addendum)
Start losartan-HCTZ 100-25 mg once daily for blood pressure.  Try to check your blood pressure if possible.  Notify me once [facility]'ve seen [facility] so that I can check your blood pressure.   It was a pleasure to see you today!

## 2020-12-27 NOTE — Assessment & Plan Note (Signed)
Resolved as of CT scan from September 2022.

## 2020-12-27 NOTE — Assessment & Plan Note (Signed)
LDL at goal which is <70. Continue Crestor 20 mg.  Lipid panel reviewed from August 2022

## 2020-12-27 NOTE — Progress Notes (Signed)
Subjective:    Patient ID: Paige Hester, female    DOB: 08/07/1955, 65 y.o.   MRN: [id number]  HPI  Paige Hester is a very pleasant 65 y.o. female who presents today for Welcome to Medicare Visit and follow up of chronic conditions.  She does not check her BP at home. She is compliant to her losartan-HCTZ 100-12.5 mg daily. She has noticed increased shortness of breath that began around February 2022 while on vacation with her husband as she couldn't keep up with him. Diagnosed with pulmonary embolism in April 2022, treated with Eliquis x 3 months, completed course.   She continues feels SOB when walking only. She denies chest pain. She underwent repeat CTA chest a few weeks ago, negative for PE. She is following with pulmonology for sleep study, plans on having this done soon. She does see cardiology, last visit was a few weeks ago, no changes.   BP Readings from Last 3 Encounters:  12/27/20 (!) 146/88  12/26/20 (!) 162/88  12/13/20 (!) 147/83   Wt Readings from Last 3 Encounters:  12/27/20 286 lb (129.7 kg)  12/26/20 284 lb (128.8 kg)  12/13/20 284 lb (128.8 kg)     HPI:  Past Medical History:  Diagnosis Date   Allergy    Gallbladder polyp 2012   GERD (gastroesophageal reflux disease)    Hypertension    Iron deficiency anemia    Menopausal symptoms    since 2007   Murmur, cardiac    Pulmonary embolism ([place]) 06/2020   Vitamin D deficiency     Current Outpatient Medications  Medication Sig Dispense Refill   albuterol (VENTOLIN HFA) 108 (90 Base) MCG/ACT inhaler Inhale 2 puffs into the lungs every 6 (six) hours as needed for wheezing or shortness of breath. 8 g 2   Cholecalciferol (VITAMIN D3) 1000 UNITS tablet Take 1,000 Units by mouth daily.     docusate sodium (COLACE) 100 MG capsule Take 1 capsule (100 mg total) by mouth 2 (two) times daily as needed for mild constipation or moderate constipation. 30 capsule 2   fluticasone (FLONASE) 50 MCG/ACT nasal  spray Place 1 spray into both nostrils 2 (two) times daily as needed for allergies or rhinitis. 16 g 0   loratadine (CLARITIN) 10 MG tablet Take 10 mg by mouth daily.     losartan-hydrochlorothiazide (HYZAAR) 100-25 MG tablet Take 1 tablet by mouth daily. For blood pressure. 90 tablet 0   megestrol (MEGACE) 40 MG tablet TAKE 1 TABLET BY MOUTH TWICE DAILY CAN  INCREASE  TO  2  TABLETS  TWICE  DAILY  IN  THE  EVENT  OF  HEAVY  BLEEDING 60 tablet 0   Melatonin 10 MG TABS Take by mouth as needed.     omeprazole (PRILOSEC) 20 MG capsule Take 20 mg by mouth daily.     Potassium 99 MG TABS Take 1 tablet by mouth daily.     rosuvastatin (CRESTOR) 20 MG tablet Take 1 tablet (20 mg total) by mouth daily. 90 tablet 3   verapamil (VERELAN PM) 360 MG 24 hr capsule Take 1 capsule (360 mg total) by mouth at bedtime. 90 capsule 3   No current facility-administered medications for this visit.    No Known Allergies  Family History  Problem Relation Age of Onset   Heart disease Mother    Hypertension Mother    Heart attack Mother 78   Prostate cancer Father    Heart disease Father 21  CAD, STENT   Coronary artery disease Sister        stent x 2   Coronary artery disease Sister 66       stent placed   Heart disease Brother        Myocardial infarction   Colon cancer Maternal Aunt    Colon polyps Neg Hx    Esophageal cancer Neg Hx    Rectal cancer Neg Hx    Stomach cancer Neg Hx     Social History   Socioeconomic History   Marital status: Legally Separated    Spouse name: Not on file   Number of children: Not on file   Years of education: Not on file   Highest education level: Not on file  Occupational History   Not on file  Tobacco Use   Smoking status: Former    Packs/day: 1.00    Years: 27.00    Pack years: 27.00    Types: Cigarettes    Quit date: 1999    Years since quitting: 23.7   Smokeless tobacco: Never  Vaping Use   Vaping Use: Never used  Substance and Sexual  Activity   Alcohol use: Yes    Alcohol/week: 7.0 standard drinks    Types: 7 Glasses of wine per week   Drug use: No   Sexual activity: Yes  Other Topics Concern   Not on file  Social History Narrative   Married.   1 child, 1 grandchild.   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   Enjoys going to [employer], singing.    Social Determinants of Health   Financial Resource Strain: Not on file  Food Insecurity: Not on file  Transportation Needs: Not on file  Physical Activity: Not on file  Stress: Not on file  Social Connections: Not on file  Intimate Partner Violence: Not on file    Hospitiliaztions: None  Health Maintenance:    Flu: Due today  Tetanus: 2014  Pneumovax: Not due  Prevnar: Prevnar 20 in August 2022  Zostavax/Shingrix: Shingrix in 2020  Bone Density: Due  Colonoscopy: Completed in 2020, due 2027  Eye Doctor: Recent exam.   Dental Exam: No recent visit   Mammogram: November 2021  Pap: 2020    Providers: Alma Friendly, PCP; Dr, Halford Chessman, pulmonology; Dr. Rockey Situ, Cardiology   I have personally reviewed and have noted: 1. The patient's medical and social history 2. Their use of alcohol, tobacco or illicit drugs 3. Their current medications and supplements 4. The patient's functional ability including ADL's, fall risks, home safety risks and  hearing or visual impairment. 5. Diet and physical activities 6. Evidence for depression or mood disorder  Subjective:   Review of Systems:   Constitutional: Denies fever, malaise, fatigue, headache or abrupt weight changes.  HEENT: Denies eye pain, eye redness, ear pain, ringing in the ears, wax buildup, runny nose, nasal congestion, bloody nose, or sore throat. Respiratory: Exertional dyspnea.    Cardiovascular: Denies chest pain, chest tightness, palpitations or swelling in the hands or feet.  Gastrointestinal: Denies abdominal pain, bloating, constipation, diarrhea or blood  in the stool.  GU: Denies urgency, frequency, pain with urination, burning sensation, blood in urine, odor or discharge. Musculoskeletal: Denies decrease in range of motion, difficulty with gait, muscle pain or joint pain and swelling.  Skin: Denies redness, rashes, lesions or ulcercations.  Neurological: Denies dizziness, difficulty with memory, difficulty with speech or problems with balance and coordination.  Psychiatric: Denies  concerns for anxiety or depression.   No other specific complaints in a complete review of systems (except as listed in HPI above).  Objective:  PE:   BP (!) 146/88   Pulse 99   Temp (!) 97.5 F (36.4 C) (Temporal)   Ht 5\' 5"  (1.651 m)   Wt 286 lb (129.7 kg)   BMI 47.59 kg/m  Wt Readings from Last 3 Encounters:  12/27/20 286 lb (129.7 kg)  12/26/20 284 lb (128.8 kg)  12/13/20 284 lb (128.8 kg)    General: Appears their stated age, well developed, well nourished in NAD. Skin: Warm, dry and intact. No rashes, lesions or ulcerations noted. HEENT: Head: normal shape and size; Eyes: sclera white, no icterus, conjunctiva pink, PERRLA and EOMs intact; Ears: Tm's gray and intact, normal light reflex; Nose: mucosa pink and moist, septum midline; Throat/Mouth: Teeth present, mucosa pink and moist, no exudate, lesions or ulcerations noted.  Neck: Normal range of motion. Neck supple, trachea midline. No massses, lumps or thyromegaly present.  Cardiovascular: Normal rate and rhythm. S1,S2 noted.  No murmur, rubs or gallops noted. No JVD or BLE edema. No carotid bruits noted. Pulmonary/Chest: Normal effort and positive vesicular breath sounds. No respiratory distress. No wheezes, rales or ronchi noted.  Abdomen: Soft and nontender. Normal bowel sounds, no bruits noted. No distention or masses noted. Liver, spleen and kidneys non palpable. Musculoskeletal: Normal range of motion. No signs of joint swelling. No difficulty with gait.  Neurological: Alert and oriented.  Cranial nerves II-XII intact. Coordination normal. +DTRs bilaterally. Psychiatric: Mood and affect normal. Behavior is normal. Judgment and thought content normal.   EKG: NSR with rate of 74, no ST changes, PAC/PVC. Appears similar to ECG from September 2022 except for PVC.   BMET    Component Value Date/Time   NA 138 12/13/2020 1805   K 3.7 12/13/2020 1805   CL 105 12/13/2020 1805   CO2 25 12/13/2020 1805   GLUCOSE 119 (H) 12/13/2020 1805   BUN 15 12/13/2020 1805   CREATININE 0.86 12/13/2020 1805   CALCIUM 9.5 12/13/2020 1805   GFRNONAA >60 12/13/2020 1805   GFRAA >60 07/25/2019 1500    Lipid Panel     Component Value Date/Time   CHOL 108 10/18/2020 1038   TRIG 43.0 10/18/2020 1038   HDL 45.60 10/18/2020 1038   CHOLHDL 2 10/18/2020 1038   VLDL 8.6 10/18/2020 1038   LDLCALC 54 10/18/2020 1038    CBC    Component Value Date/Time   WBC 5.5 12/13/2020 1805   RBC 4.57 12/13/2020 1805   HGB 13.0 12/13/2020 1805   HCT 38.9 12/13/2020 1805   PLT 237 12/13/2020 1805   MCV 85.1 12/13/2020 1805   MCH 28.4 12/13/2020 1805   MCHC 33.4 12/13/2020 1805   RDW 14.7 12/13/2020 1805   LYMPHSABS 2.1 12/13/2020 1805   MONOABS 0.5 12/13/2020 1805   EOSABS 0.3 12/13/2020 1805   BASOSABS 0.0 12/13/2020 1805    Hgb A1C Lab Results  Component Value Date   HGBA1C 6.1 10/18/2020      Assessment and Plan:   Medicare Annual Wellness Visit:  Diet: She endorses a fair diet.  Physical activity: Sedentary Depression/mood screen: Negative Hearing: Intact to whispered voice Visual acuity: Grossly normal, performs annual eye exam  ADLs: Capable Fall risk: None Home safety: Good Cognitive evaluation: Intact to orientation, naming, recall and repetition EOL planning: Adv directives, full code/ I agree  Preventative Medicine: Immunizations UTD. Mammogram due in November. Bone  density scan due. Colonoscopy UTD, due 2027. Discussed the importance of a healthy diet and regular exercise  in order for weight loss, and to reduce the risk of further co-morbidity.   Next appointment: One year       Review of Systems       Past Medical History:  Diagnosis Date   Allergy    Gallbladder polyp 2012   GERD (gastroesophageal reflux disease)    Hypertension    Iron deficiency anemia    Menopausal symptoms    since 2007   Murmur, cardiac    Pulmonary embolism ([place]) 06/2020   Vitamin D deficiency     Social History   Socioeconomic History   Marital status: Legally Separated    Spouse name: Not on file   Number of children: Not on file   Years of education: Not on file   Highest education level: Not on file  Occupational History   Not on file  Tobacco Use   Smoking status: Former    Packs/day: 1.00    Years: 27.00    Pack years: 27.00    Types: Cigarettes    Quit date: 1999    Years since quitting: 23.7   Smokeless tobacco: Never  Vaping Use   Vaping Use: Never used  Substance and Sexual Activity   Alcohol use: Yes    Alcohol/week: 7.0 standard drinks    Types: 7 Glasses of wine per week   Drug use: No   Sexual activity: Yes  Other Topics Concern   Not on file  Social History Narrative   Married.   1 child, 1 grandchild.   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   Enjoys going to [employer], singing.    Social Determinants of Health   Financial Resource Strain: Not on file  Food Insecurity: Not on file  Transportation Needs: Not on file  Physical Activity: Not on file  Stress: Not on file  Social Connections: Not on file  Intimate Partner Violence: Not on file    Past Surgical History:  Procedure Laterality Date   COLONOSCOPY  2010   2020   HYSTEROSCOPY WITH D & C N/A 07/27/2019   Procedure: DILATATION AND CURETTAGE /HYSTEROSCOPY, Polypectomy;  Surgeon: Osborne Oman, MD;  Location: [facility];  Service: Gynecology;  Laterality: N/A;    Family History  Problem Relation Age of  Onset   Heart disease Mother    Hypertension Mother    Heart attack Mother 26   Prostate cancer Father    Heart disease Father 58       CAD, STENT   Coronary artery disease Sister        stent x 2   Coronary artery disease Sister 81       stent placed   Heart disease Brother        Myocardial infarction   Colon cancer Maternal Aunt    Colon polyps Neg Hx    Esophageal cancer Neg Hx    Rectal cancer Neg Hx    Stomach cancer Neg Hx     No Known Allergies  Current Outpatient Medications on File Prior to Visit  Medication Sig Dispense Refill   albuterol (VENTOLIN HFA) 108 (90 Base) MCG/ACT inhaler Inhale 2 puffs into the lungs every 6 (six) hours as needed for wheezing or shortness of breath. 8 g 2   Cholecalciferol (VITAMIN D3) 1000 UNITS tablet Take 1,000 Units by mouth daily.  docusate sodium (COLACE) 100 MG capsule Take 1 capsule (100 mg total) by mouth 2 (two) times daily as needed for mild constipation or moderate constipation. 30 capsule 2   fluticasone (FLONASE) 50 MCG/ACT nasal spray Place 1 spray into both nostrils 2 (two) times daily as needed for allergies or rhinitis. 16 g 0   loratadine (CLARITIN) 10 MG tablet Take 10 mg by mouth daily.     megestrol (MEGACE) 40 MG tablet TAKE 1 TABLET BY MOUTH TWICE DAILY CAN  INCREASE  TO  2  TABLETS  TWICE  DAILY  IN  THE  EVENT  OF  HEAVY  BLEEDING 60 tablet 0   Melatonin 10 MG TABS Take by mouth as needed.     omeprazole (PRILOSEC) 20 MG capsule Take 20 mg by mouth daily.     Potassium 99 MG TABS Take 1 tablet by mouth daily.     rosuvastatin (CRESTOR) 20 MG tablet Take 1 tablet (20 mg total) by mouth daily. 90 tablet 3   verapamil (VERELAN PM) 360 MG 24 hr capsule Take 1 capsule (360 mg total) by mouth at bedtime. 90 capsule 3   No current facility-administered medications on file prior to visit.    BP (!) 146/88   Pulse 99   Temp (!) 97.5 F (36.4 C) (Temporal)   Ht 5\' 5"  (1.651 m)   Wt 286 lb (129.7 kg)   BMI 47.59  kg/m  Objective:   Physical Exam        Assessment & Plan:      This visit occurred during the SARS-CoV-2 public health emergency.  Safety protocols were in place, including screening questions prior to the visit, additional usage of staff PPE, and extensive cleaning of exam room while observing appropriate contact time as indicated for disinfecting solutions.

## 2020-12-27 NOTE — Assessment & Plan Note (Addendum)
Immunizations UTD. Mammogram due in November. Bone density scan due. Colonoscopy UTD, due 2027. Discussed the importance of a healthy diet and regular exercise in order for weight loss, and to reduce the risk of further co-morbidity.  I have personally reviewed and have noted: 1. The patient's medical and social history 2. Their use of alcohol, tobacco or illicit drugs 3. Their current medications and supplements 4. The patient's functional ability including ADL's, fall risks, home safety risks and  hearing or visual impairment. 5. Diet and physical activities 6. Evidence for depression or mood disorder

## 2020-12-27 NOTE — Assessment & Plan Note (Signed)
Controlled on omeprazole 20 mg for which she takes every other day, continue same.

## 2020-12-27 NOTE — Assessment & Plan Note (Signed)
She is pending sleep study per pulmonology.

## 2020-12-27 NOTE — Assessment & Plan Note (Signed)
LDL at goal of <70. Lipid panel from August 2022 reviewed.  Continue Crestor 20 mg.

## 2020-12-28 ENCOUNTER — Telehealth: Payer: Self-pay | Admitting: Primary Care

## 2020-12-28 NOTE — Telephone Encounter (Signed)
Ok to place order 

## 2020-12-28 NOTE — Telephone Encounter (Signed)
Pt called in stating she had an appt with provider on yesterday and pt thought provider was gonna placed the order for the bone density and she only placed order for mammogram. Pt wants both placed at same time so she can have then done on same day

## 2020-12-31 NOTE — Telephone Encounter (Signed)
Called patient l/m to call office. Need to let know both orders are in.

## 2021-01-01 ENCOUNTER — Telehealth: Payer: Self-pay | Admitting: Pulmonary Disease

## 2021-01-01 DIAGNOSIS — R0683 Snoring: Secondary | ICD-10-CM

## 2021-01-01 NOTE — Telephone Encounter (Signed)
Pt returned call. I relayed message to her and she verbalized understanding.

## 2021-01-01 NOTE — Telephone Encounter (Signed)
Received message that patient would like to change sleep study to in lab study.  I have placed order to set up in lab sleep study.  Please have [facility] cancel her home sleep study.  Please let her know that this might be denied by insurance, and then might be required to do home sleep study.

## 2021-01-02 ENCOUNTER — Ambulatory Visit (INDEPENDENT_AMBULATORY_CARE_PROVIDER_SITE_OTHER): Payer: Medicare Other | Admitting: Family Medicine

## 2021-01-02 ENCOUNTER — Encounter: Payer: Self-pay | Admitting: Family Medicine

## 2021-01-02 ENCOUNTER — Other Ambulatory Visit: Payer: Self-pay

## 2021-01-02 VITALS — BP 150/80 | HR 72 | Temp 98.1°F | Ht 65.0 in | Wt 285.4 lb

## 2021-01-02 DIAGNOSIS — M65322 Trigger finger, left index finger: Secondary | ICD-10-CM

## 2021-01-02 MED ORDER — TRIAMCINOLONE ACETONIDE 40 MG/ML IJ SUSP
20.0000 mg | Freq: Once | INTRAMUSCULAR | Status: AC
  Administered 2021-01-02: 20 mg via INTRA_ARTICULAR

## 2021-01-02 NOTE — Progress Notes (Signed)
    Tameem Pullara T. Levent Kornegay, MD, [place] at [facility] [place], [address]  Phone: [phone]  FAX: [name]  MRN [id number]  Date of Birth: Dec 01, 1955  Date: 01/02/2021  PCP: Pleas Koch, NP  Referral: Pleas Koch, NP  Chief Complaint  Patient presents with   Trigger finger    Left Index    This visit occurred during the SARS-CoV-2 public health emergency.  Safety protocols were in place, including screening questions prior to the visit, additional usage of staff PPE, and extensive cleaning of exam room while observing appropriate contact time as indicated for disinfecting solutions.    Tendon Sheath Injection Procedure Note Paige Hester 04-16-1955 Date of procedure: 01/02/2021  Procedure: Tendon Sheath Injection for Trigger Finger, L 2nd Indications: Pain  Procedure Details Verbal consent was obtained. Risks (including potential risk for skin lightening and potential atrophy), benefits and alternatives were discussed. Prepped with Chloraprep and Ethyl Chloride used for anesthesia. Under sterile conditions, patient injected at palmar crease aiming distally with 45 degree angle towards nodule; injected directly into tendon sheath. Medication flowed freely without resistance.  Needle size: 22 gauge 1 1/2 inch Injection: 1/2 cc of Lidocaine 1% and Kenalog 20 mg Medication: 1/2 cc of Kenalog 40 mg (equaling Kenalog 20 mg)     ICD-10-CM   1. Trigger finger, left index finger  M65.322 triamcinolone acetonide (KENALOG-40) injection 20 mg      Meds ordered this encounter  Medications   triamcinolone acetonide (KENALOG-40) injection 20 mg   Signed,  Keishia Ground T. Jameer Storie, MD

## 2021-01-08 NOTE — Telephone Encounter (Signed)
Spoke to patient. She voiced understanding. Nothing further needed at this time.

## 2021-01-08 NOTE — Telephone Encounter (Signed)
ATC patient. LMTCB with RDS office number  

## 2021-01-18 NOTE — Telephone Encounter (Signed)
Per Stacie at [facility] the patient has been scheduled for 01/29/21

## 2021-01-22 ENCOUNTER — Ambulatory Visit (INDEPENDENT_AMBULATORY_CARE_PROVIDER_SITE_OTHER): Payer: Medicare Other | Admitting: Family Medicine

## 2021-01-22 ENCOUNTER — Other Ambulatory Visit: Payer: Self-pay

## 2021-01-22 ENCOUNTER — Encounter (INDEPENDENT_AMBULATORY_CARE_PROVIDER_SITE_OTHER): Payer: Self-pay | Admitting: Family Medicine

## 2021-01-22 VITALS — BP 165/76 | HR 65 | Temp 98.0°F | Ht 64.0 in | Wt 280.0 lb

## 2021-01-22 DIAGNOSIS — Z9189 Other specified personal risk factors, not elsewhere classified: Secondary | ICD-10-CM

## 2021-01-22 DIAGNOSIS — I10 Essential (primary) hypertension: Secondary | ICD-10-CM

## 2021-01-22 DIAGNOSIS — R5383 Other fatigue: Secondary | ICD-10-CM

## 2021-01-22 DIAGNOSIS — E7849 Other hyperlipidemia: Secondary | ICD-10-CM | POA: Diagnosis not present

## 2021-01-22 DIAGNOSIS — Z1331 Encounter for screening for depression: Secondary | ICD-10-CM | POA: Diagnosis not present

## 2021-01-22 DIAGNOSIS — Z6841 Body Mass Index (BMI) 40.0 and over, adult: Secondary | ICD-10-CM

## 2021-01-22 DIAGNOSIS — R0602 Shortness of breath: Secondary | ICD-10-CM | POA: Diagnosis not present

## 2021-01-22 DIAGNOSIS — I739 Peripheral vascular disease, unspecified: Secondary | ICD-10-CM

## 2021-01-22 DIAGNOSIS — R7303 Prediabetes: Secondary | ICD-10-CM

## 2021-01-22 DIAGNOSIS — E559 Vitamin D deficiency, unspecified: Secondary | ICD-10-CM

## 2021-01-23 LAB — VITAMIN D 25 HYDROXY (VIT D DEFICIENCY, FRACTURES): Vit D, 25-Hydroxy: 30.8 ng/mL (ref 30.0–100.0)

## 2021-01-23 LAB — FOLATE: Folate: 13.2 ng/mL (ref >3.0)

## 2021-01-23 LAB — HEMOGLOBIN A1C
Est. average glucose Bld gHb Est-mCnc: 114 mg/dL
Hgb A1c MFr Bld: 5.6 % (ref 4.8–5.6)

## 2021-01-23 LAB — LIPID PANEL
Chol/HDL Ratio: 2.1 ratio (ref 0.0–4.4)
Cholesterol, Total: 130 mg/dL (ref 100–199)
HDL: 61 mg/dL (ref >39)
LDL Chol Calc (NIH): 58 mg/dL (ref 0–99)
Triglycerides: 44 mg/dL (ref 0–149)
VLDL Cholesterol Cal: 11 mg/dL (ref 5–40)

## 2021-01-23 LAB — VITAMIN B12: Vitamin B-12: 415 pg/mL (ref 232–1245)

## 2021-01-23 LAB — INSULIN, RANDOM: INSULIN: 14.8 u[IU]/mL (ref 2.6–24.9)

## 2021-01-23 LAB — T4, FREE: Free T4: 1.37 ng/dL (ref 0.82–1.77)

## 2021-01-23 LAB — TSH: TSH: 2.93 u[IU]/mL (ref 0.450–4.500)

## 2021-01-23 NOTE — Progress Notes (Signed)
Chief Complaint:   OBESITY Paige Hester (MR# [id number]) is a 65 y.o. female who presents for evaluation and treatment of obesity and related comorbidities. Current BMI is Body mass index is 48.06 kg/m. Paige Hester has been struggling with her weight for many years and has been unsuccessful in either losing weight, maintaining weight loss, or reaching her healthy weight goal.  Paige Hester is retired from [employer], and she lives alone. She wishes to lose over 100 lbs in 1 year. She craves potatoes, chips, and cookies. She drinks caloric beverages. Due to PE she is deconditioned.  Paige Hester is currently in the action stage of change and ready to dedicate time achieving and maintaining a healthier weight. Paige Hester is interested in becoming our patient and working on intensive lifestyle modifications including (but not limited to) diet and exercise for weight loss.  Paige Hester's habits were reviewed today and are as follows: she thinks her family will eat healthier with her, her desired weight loss is 100 lbs, she started gaining weight during pregnancy, her heaviest weight ever was 286 pounds, she has significant food cravings issues, she snacks frequently in the evenings, she skips meals frequently, she is frequently drinking liquids with calories, and she struggles with emotional eating.  Depression Screen Paige Hester's Food and Mood (modified PHQ-9) score was 8.  Depression screen PHQ 2/9 01/22/2021  Decreased Interest 2  Down, Depressed, Hopeless 1  PHQ - 2 Score 3  Altered sleeping 0  Tired, decreased energy 3  Change in appetite 1  Feeling bad or failure about yourself  0  Trouble concentrating 0  Moving slowly or fidgety/restless 1  Suicidal thoughts 0  PHQ-9 Score 8  Difficult doing work/chores Not difficult at all   Subjective:   1. Other fatigue Paige Hester admits to daytime somnolence and admits to waking up still tired. Patent has a history of symptoms of daytime fatigue  and morning fatigue. Paige Hester generally gets 4 or 5 hours of sleep per night, and states that she has nightime awakenings. Snoring is present. Apneic episodes are present. Epworth Sleepiness Score is 11. After Paige Hester had PE in April 2022, she has had pulmonary hypertension and she sees a Pulmonary specialist. She is not restricted/inactivity, but deconditioned.   2. Shortness of breath on exertion Paige Hester notes increasing shortness of breath with exercising and seems to be worsening over time with weight gain. She notes getting out of breath sooner with activity than she used to. This has not gotten worse recently. Paige Hester denies shortness of breath at rest or orthopnea.  3. Essential hypertension Paige Hester is taking Verapamil and Hyzaar. Her primary care provider recently increased her Hyzaar dose in September. She is not checking her blood pressure at home, but she does have a blood pressure monitor. She sees Dr. Rockey Situ, Cardiology for coronary calcifications. Her blood pressure is not at goal today.  4. Other hyperlipidemia Paige Hester has hyperlipidemia and she is taking Crestor. She has been trying to improve her cholesterol levels with intensive lifestyle modification including a low saturated fat diet, exercise and weight loss. She denies any chest pain, claudication or myalgias.  5. Peripheral vascular disease (Paige Hester) Paige Hester has a history of PE in April 2022. She also has a history of anemia with FE deficiency in the past as well. She had a CT.  6. Pre-diabetes [name] has a diagnosis of pre-diabetes based on her elevated HgA1c and was informed this puts her at greater risk of developing diabetes. She is not  on medications. She continues to work on diet and exercise to decrease her risk of diabetes. She denies nausea or hypoglycemia.  7. Vitamin D deficiency Paige Hester is currently taking OTC vitamin D 1,000 units each day. She denies nausea, vomiting or muscle weakness.  8. At risk for heart  disease Paige Hester is at a higher than average risk for cardiovascular disease due to hyperlipidemia and poorly controlled blood pressure.   Assessment/Plan:   Orders Placed This Encounter  Procedures   Vitamin B12   Folate   Insulin, random   T4, free   TSH   VITAMIN D 25 Hydroxy (Vit-D Deficiency, Fractures)   Hemoglobin A1c   Lipid panel    Medications Discontinued During This Encounter  Medication Reason   megestrol (MEGACE) 40 MG tablet Error     No orders of the defined types were placed in this encounter.    1. Other fatigue Paige Hester does feel that her weight is causing her energy to be lower than it should be. Fatigue may be related to obesity, depression or many other causes. Labs will be ordered, and in the meanwhile, Paige Hester will be going for an obstructive sleep apnea sleep study on 01/29/2021 with Pulmonary. She will ask about pulmonary rehab as well. She will focus on self care including making healthy food choices, increasing physical activity and focusing on stress reduction.  - Vitamin B12 - Folate - Insulin, random - T4, free - TSH - Hemoglobin A1c  2. Shortness of breath on exertion Paige Hester does feel that she gets out of breath more easily that she used to when she exercises. Paige Hester's shortness of breath appears to be obesity related and exercise induced. She has agreed to work on weight loss and gradually increase exercise to treat her exercise induced shortness of breath. Will continue to monitor closely.  3. Essential hypertension We will check labs today. Paige Hester is to check her blood pressures at home, the goal is <130/80. She is to decrease her sodium, follow her meal plan, and work on weight loss. We will watch for signs of hypotension as she continues her lifestyle modifications.  - Vitamin B12 - Folate - Lipid panel  4. Other hyperlipidemia Cardiovascular risk and specific lipid/LDL goals reviewed. We discussed several lifestyle modifications  today. We will check labs today. Paige Hester will continue to work on diet, exercise and weight loss efforts. Orders and follow up as documented in patient record.   Counseling Intensive lifestyle modifications are the first line treatment for this issue. Dietary changes: Increase soluble fiber. Decrease simple carbohydrates. Exercise changes: Moderate to vigorous-intensity aerobic activity 150 minutes per week if tolerated. Lipid-lowering medications: see documented in medical record.  - Lipid panel  5. Peripheral vascular disease ([place]) We will check labs today, and will follow up at Jamyah's next office visit.  6. Pre-diabetes We will check labs today. Klara will continue to work on weight loss, exercise, and decreasing simple carbohydrates to help decrease the risk of diabetes.   - Insulin, random - T4, free - TSH - Hemoglobin A1c  7. Vitamin D deficiency Low Vitamin D level contributes to fatigue and are associated with obesity, breast, and colon cancer. We will check labs today. Alura will follow-up for routine testing of Vitamin D, at least 2-3 times per year to avoid over-replacement.  - VITAMIN D 25 Hydroxy (Vit-D Deficiency, Fractures)  8. Screening for depression Jozee had a positive depression screening. Depression is commonly associated with obesity and often results in emotional eating  behaviors. We will monitor this closely and work on CBT to help improve the non-hunger eating patterns. Referral to Psychology may be required if no improvement is seen as she continues in our clinic.  9. At risk for heart disease Katarzyna was given approximately 23 minutes of coronary artery disease prevention counseling today. She is 65 y.o. female and has risk factors for heart disease including obesity. We discussed intensive lifestyle modifications today with an emphasis on specific weight loss instructions and strategies.   Repetitive spaced learning was employed today to elicit  superior memory formation and behavioral change.  10. Obesity with current BMI of 48.2 Tynasia is currently in the action stage of change and her goal is to continue with weight loss efforts. I recommend Decie begin the structured treatment plan as follows:  She has agreed to the Category 2 Plan.  Exercise goals: As is.   Behavioral modification strategies: increasing lean protein intake, decreasing simple carbohydrates, and meal planning and cooking strategies.  She was informed of the importance of frequent follow-up visits to maximize her success with intensive lifestyle modifications for her multiple health conditions. She was informed we would discuss her lab results at her next visit unless there is a critical issue that needs to be addressed sooner. Collene agreed to keep her next visit at the agreed upon time to discuss these results.  Objective:   Blood pressure (!) 165/76, pulse 65, temperature 98 F (36.7 C), height 5\' 4"  (1.626 m), weight 280 lb (127 kg), SpO2 98 %. Body mass index is 48.06 kg/m.  EKG: Normal sinus rhythm, rate 74 BPM.  Indirect Calorimeter completed today shows a VO2 of 259 and a REE of 1786.  Her calculated basal metabolic rate is 0454 thus her basal metabolic rate is worse than expected.  General: Cooperative, alert, well developed, in no acute distress. HEENT: Conjunctivae and lids unremarkable. Cardiovascular: Regular rhythm.  Lungs: Normal work of breathing. Neurologic: No focal deficits.   Lab Results  Component Value Date   CREATININE 0.86 12/13/2020   BUN 15 12/13/2020   NA 138 12/13/2020   K 3.7 12/13/2020   CL 105 12/13/2020   CO2 25 12/13/2020   Lab Results  Component Value Date   ALT 31 12/13/2020   AST 30 12/13/2020   ALKPHOS 63 12/13/2020   BILITOT 0.7 12/13/2020   Lab Results  Component Value Date   HGBA1C 5.6 01/22/2021   HGBA1C 6.1 10/18/2020   HGBA1C 6.0 11/18/2019   HGBA1C 5.9 (A) 03/03/2019   HGBA1C 6.2 08/27/2018    Lab Results  Component Value Date   INSULIN 14.8 01/22/2021   Lab Results  Component Value Date   TSH 2.930 01/22/2021   Lab Results  Component Value Date   CHOL 130 01/22/2021   HDL 61 01/22/2021   LDLCALC 58 01/22/2021   TRIG 44 01/22/2021   CHOLHDL 2.1 01/22/2021   Lab Results  Component Value Date   WBC 5.5 12/13/2020   HGB 13.0 12/13/2020   HCT 38.9 12/13/2020   MCV 85.1 12/13/2020   PLT 237 12/13/2020   No results found for: IRON, TIBC, FERRITIN  Attestation Statements:   Reviewed by clinician on day of visit: allergies, medications, problem list, medical history, surgical history, family history, social history, and previous encounter notes.   Wilhemena Durie, am acting as transcriptionist for [employer], DO.  I have reviewed the above documentation for accuracy and completeness, and I agree with the above. Diego Cory  Axavier Pressley, D.O.  The [place] was signed into law in 2016 which includes the topic of electronic health records.  This provides immediate access to information in MyChart.  This includes consultation notes, operative notes, office notes, lab results and pathology reports.  If you have any questions about what you read please let us know at your next visit so we can discuss your concerns and take corrective action if need be.  We are right here with you.

## 2021-01-24 ENCOUNTER — Encounter (INDEPENDENT_AMBULATORY_CARE_PROVIDER_SITE_OTHER): Payer: Self-pay | Admitting: Family Medicine

## 2021-01-28 ENCOUNTER — Other Ambulatory Visit: Payer: Self-pay | Admitting: Obstetrics & Gynecology

## 2021-01-28 ENCOUNTER — Other Ambulatory Visit: Payer: Self-pay | Admitting: Primary Care

## 2021-01-28 DIAGNOSIS — I10 Essential (primary) hypertension: Secondary | ICD-10-CM

## 2021-01-28 DIAGNOSIS — I272 Pulmonary hypertension, unspecified: Secondary | ICD-10-CM

## 2021-01-28 NOTE — Telephone Encounter (Signed)
Last OV with Dr Opalski 

## 2021-01-29 ENCOUNTER — Other Ambulatory Visit: Payer: Self-pay | Admitting: Pulmonary Disease

## 2021-01-29 ENCOUNTER — Ambulatory Visit: Payer: Medicare Other | Attending: Pulmonary Disease

## 2021-01-29 DIAGNOSIS — R0683 Snoring: Secondary | ICD-10-CM | POA: Insufficient documentation

## 2021-01-29 LAB — SARS CORONAVIRUS 2 (TAT 6-24 HRS): SARS Coronavirus 2: NEGATIVE

## 2021-01-29 NOTE — Telephone Encounter (Signed)
Dr. Halford Chessman please advise on patient mychart message. Thank you    What are the details of sleep study. Nothing appears in MyChart concerning detail of my Nov 15 upcoming visit. Please call. [phone]

## 2021-01-30 ENCOUNTER — Other Ambulatory Visit: Payer: Self-pay

## 2021-01-31 ENCOUNTER — Ambulatory Visit
Admission: RE | Admit: 2021-01-31 | Discharge: 2021-01-31 | Disposition: A | Discharge status: Home / Self Care | Payer: Medicare Other | Source: Ambulatory Visit | Attending: Primary Care | Admitting: Primary Care

## 2021-01-31 ENCOUNTER — Other Ambulatory Visit: Payer: Self-pay

## 2021-01-31 DIAGNOSIS — Z1231 Encounter for screening mammogram for malignant neoplasm of breast: Secondary | ICD-10-CM | POA: Insufficient documentation

## 2021-01-31 DIAGNOSIS — E2839 Other primary ovarian failure: Secondary | ICD-10-CM | POA: Diagnosis present

## 2021-01-31 NOTE — Telephone Encounter (Signed)
Needs in office visit for ASAP. BP check

## 2021-01-31 NOTE — Telephone Encounter (Signed)
Called patient set up appointment for follow up. No further action needed at this time.

## 2021-01-31 NOTE — Telephone Encounter (Signed)
Left message to return call to our office.  

## 2021-02-01 ENCOUNTER — Other Ambulatory Visit: Payer: Self-pay | Admitting: Primary Care

## 2021-02-01 ENCOUNTER — Telehealth (INDEPENDENT_AMBULATORY_CARE_PROVIDER_SITE_OTHER): Payer: Federal, State, Local not specified - PPO | Admitting: Pulmonary Disease

## 2021-02-01 DIAGNOSIS — N6489 Other specified disorders of breast: Secondary | ICD-10-CM

## 2021-02-01 DIAGNOSIS — G4733 Obstructive sleep apnea (adult) (pediatric): Secondary | ICD-10-CM

## 2021-02-01 DIAGNOSIS — R928 Other abnormal and inconclusive findings on diagnostic imaging of breast: Secondary | ICD-10-CM

## 2021-02-01 NOTE — Telephone Encounter (Signed)
Moderate OSA _ events mostly during REM , RDI 22/h Split >> CPAP 8 cm with med nasal mask

## 2021-02-01 NOTE — Telephone Encounter (Signed)
Please schedule ROV with me or NP to review sleep study results.

## 2021-02-05 ENCOUNTER — Ambulatory Visit (INDEPENDENT_AMBULATORY_CARE_PROVIDER_SITE_OTHER): Payer: Self-pay | Admitting: Family Medicine

## 2021-02-05 ENCOUNTER — Encounter (INDEPENDENT_AMBULATORY_CARE_PROVIDER_SITE_OTHER): Payer: Self-pay | Admitting: Family Medicine

## 2021-02-05 ENCOUNTER — Ambulatory Visit: Payer: Medicare Other | Admitting: Primary Care

## 2021-02-06 ENCOUNTER — Encounter: Payer: Self-pay | Admitting: Pulmonary Disease

## 2021-02-06 NOTE — Telephone Encounter (Signed)
ATC patient to go over results and get her scheduled for an appointment, unable to leave VM due to mailbox being full will try again later

## 2021-02-08 DIAGNOSIS — G4733 Obstructive sleep apnea (adult) (pediatric): Secondary | ICD-10-CM

## 2021-02-08 NOTE — Telephone Encounter (Signed)
ATC patient x 2 unable to leave VM due to it being full. Letter has been sent to patient. Nothing further needed at this time.

## 2021-02-12 ENCOUNTER — Other Ambulatory Visit: Payer: Self-pay

## 2021-02-12 ENCOUNTER — Ambulatory Visit (INDEPENDENT_AMBULATORY_CARE_PROVIDER_SITE_OTHER): Payer: Medicare Other | Admitting: Primary Care

## 2021-02-12 ENCOUNTER — Encounter: Payer: Self-pay | Admitting: Primary Care

## 2021-02-12 ENCOUNTER — Other Ambulatory Visit: Payer: Self-pay | Admitting: Primary Care

## 2021-02-12 VITALS — BP 150/84 | HR 82 | Temp 97.3°F | Ht 64.0 in | Wt 283.0 lb

## 2021-02-12 VITALS — BP 152/84 | HR 69 | Temp 98.3°F | Ht 65.0 in | Wt 284.0 lb

## 2021-02-12 DIAGNOSIS — E7800 Pure hypercholesterolemia, unspecified: Secondary | ICD-10-CM | POA: Diagnosis not present

## 2021-02-12 DIAGNOSIS — G4733 Obstructive sleep apnea (adult) (pediatric): Secondary | ICD-10-CM | POA: Diagnosis not present

## 2021-02-12 DIAGNOSIS — Z86711 Personal history of pulmonary embolism: Secondary | ICD-10-CM

## 2021-02-12 DIAGNOSIS — R0609 Other forms of dyspnea: Secondary | ICD-10-CM | POA: Diagnosis not present

## 2021-02-12 DIAGNOSIS — I10 Essential (primary) hypertension: Secondary | ICD-10-CM

## 2021-02-12 DIAGNOSIS — R0600 Dyspnea, unspecified: Secondary | ICD-10-CM | POA: Insufficient documentation

## 2021-02-12 LAB — BASIC METABOLIC PANEL
BUN: 12 mg/dL (ref 6–23)
CO2: 30 mEq/L (ref 19–32)
Calcium: 10.2 mg/dL (ref 8.4–10.5)
Chloride: 103 mEq/L (ref 96–112)
Creatinine, Ser: 1 mg/dL (ref 0.40–1.20)
GFR: 58.89 mL/min — ABNORMAL LOW (ref >60.00)
Glucose, Bld: 90 mg/dL (ref 70–99)
Potassium: 3.7 mEq/L (ref 3.5–5.1)
Sodium: 139 mEq/L (ref 135–145)

## 2021-02-12 LAB — LIPID PANEL
Cholesterol: 114 mg/dL (ref 0–200)
HDL: 57 mg/dL (ref >39.00)
LDL Cholesterol: 47 mg/dL (ref 0–99)
NonHDL: 56.56
Total CHOL/HDL Ratio: 2
Triglycerides: 46 mg/dL (ref 0.0–149.0)
VLDL: 9.2 mg/dL (ref 0.0–40.0)

## 2021-02-12 NOTE — Assessment & Plan Note (Signed)
-   Original sleep consult in September 2022 for snoring. Split night sleep study on 01/29/21 showed moderate OSA, events mostly during REM/ RDI 22/hr. Discussed treatment options with patient. Due to significant cardiac history recommend patient be started on CPAP, optimal pressure 8cm h20 with medium nasal mask. Advised she aim to wear CPAP every night 4-6 hours or longer. FU in 3-4 months or sooner if needed.

## 2021-02-12 NOTE — Progress Notes (Signed)
Subjective:    Patient ID: Paige Hester, female    DOB: 03/01/1956, 65 y.o.   MRN: [id number]  HPI  Paige Hester is a very pleasant 65 y.o. female with a history of hypertension, cardiomegaly, pulmonary hypertension, aortic atherosclerosis, prediabetes, hyperlipidemia who presents today for follow up of hypertension.  Last evaluated in our clinic in mid October 2022 for [facility] Visit. During this visit BP was noted to be above goal, also during other office visits despite management on Verapamil 360 mg, losartan-HCTZ 100-12.5 mg. During this visit we increased her HCTZ to 25 mg.   Evaluated by cardiology in June 2022, losartan was increased to 100 mg at that time. No mention of verapamil.   To be evaluated today per pulmonology to go over sleep study results which revealed moderate OSA. She was prescribed a CPAP machine and instructed to follow up in 4 months.  Today she endorses compliance to losartan-HCTZ 100-25 mg and verapamil 360 mg daily. She is checking her BP at home which is running 150/80-90's. She's unsure why she was placed on verapamil in the past. Has never tried amlodipine. She denies chest pain. She does notice headaches.   BP Readings from Last 3 Encounters:  02/12/21 (!) 150/84  01/22/21 (!) 165/76  01/02/21 (!) 150/80      Review of Systems  Eyes:  Negative for visual disturbance.  Respiratory:  Negative for shortness of breath.   Cardiovascular:  Negative for chest pain.  Neurological:  Positive for headaches. Negative for dizziness.        Past Medical History:  Diagnosis Date   Allergy    Bilateral swelling of feet and ankles    Gallbladder polyp 2012   GERD (gastroesophageal reflux disease)    Hyperlipidemia    Hypertension    Iron deficiency anemia    Menopausal symptoms    since 2007   Murmur, cardiac    Other fatigue    Prediabetes    Pulmonary embolism (HCC) 06/2020   Shortness of breath    Shortness of breath on  exertion    Vitamin D deficiency     Social History   Socioeconomic History   Marital status: Legally Separated    Spouse name: Not on file   Number of children: 1   Years of education: Not on file   Highest education level: Not on file  Occupational History   Occupation: Retired  Tobacco Use   Smoking status: Former    Packs/day: 1.00    Years: 27.00    Pack years: 27.00    Types: Cigarettes    Quit date: 1999    Years since quitting: 23.9   Smokeless tobacco: Never  Vaping Use   Vaping Use: Never used  Substance and Sexual Activity   Alcohol use: Yes    Alcohol/week: 7.0 standard drinks    Types: 7 Glasses of wine per week    Comment: 1-2 glasses of wine per night   Drug use: No   Sexual activity: Yes  Other Topics Concern   Not on file  Social History Narrative   Married.   1 child, 1 grandchild.   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   Enjoys going to [employer], singing.    Social Determinants of Health   Financial Resource Strain: Not on file  Food Insecurity: Not on file  Transportation Needs: Not on file  Physical Activity: Not on file  Stress:  Not on file  Social Connections: Not on file  Intimate Partner Violence: Not on file    Past Surgical History:  Procedure Laterality Date   COLONOSCOPY  2010   2020   HYSTEROSCOPY WITH D & C N/A 07/27/2019   Procedure: DILATATION AND CURETTAGE /HYSTEROSCOPY, Polypectomy;  Surgeon: Osborne Oman, MD;  Location: [place];  Service: Gynecology;  Laterality: N/A;    Family History  Problem Relation Age of Onset   Depression Mother    Stroke Mother    Sudden death Mother    Heart disease Mother    Hypertension Mother    Heart attack Mother 21   Cancer Father    Prostate cancer Father    Heart disease Father 36       CAD, STENT   Coronary artery disease Sister        stent x 2   Coronary artery disease Sister 43       stent placed   Colon  cancer Maternal Aunt    Heart disease Brother        Myocardial infarction   Sudden death Other    Depression Other    Colon polyps Neg Hx    Esophageal cancer Neg Hx    Rectal cancer Neg Hx    Stomach cancer Neg Hx    Breast cancer Neg Hx     No Known Allergies  Current Outpatient Medications on File Prior to Visit  Medication Sig Dispense Refill   albuterol (VENTOLIN HFA) 108 (90 Base) MCG/ACT inhaler Inhale 2 puffs into the lungs every 6 (six) hours as needed for wheezing or shortness of breath. 8 g 2   Cholecalciferol (VITAMIN D3) 1000 UNITS tablet Take 1,000 Units by mouth daily.     docusate sodium (COLACE) 100 MG capsule Take 1 capsule (100 mg total) by mouth 2 (two) times daily as needed for mild constipation or moderate constipation. 30 capsule 2   fluticasone (FLONASE) 50 MCG/ACT nasal spray Place 1 spray into both nostrils 2 (two) times daily as needed for allergies or rhinitis. 16 g 0   loratadine (CLARITIN) 10 MG tablet Take 10 mg by mouth daily.     losartan-hydrochlorothiazide (HYZAAR) 100-25 MG tablet Take 1 tablet by mouth daily. For blood pressure. 90 tablet 0   megestrol (MEGACE) 40 MG tablet TAKE 1 TABLET BY MOUTH TWICE DAILY CAN  INCREASE  TO  2  TABLETS  TWICE  DAILY  IN  THE  EVENT  OF  HEAVY  BLEEDING 60 tablet 5   Melatonin 10 MG TABS Take by mouth as needed.     omeprazole (PRILOSEC) 20 MG capsule Take 20 mg by mouth every other day.     Potassium 99 MG TABS Take 1 tablet by mouth in the morning and at bedtime.     rosuvastatin (CRESTOR) 20 MG tablet Take 1 tablet (20 mg total) by mouth daily. 90 tablet 3   verapamil (VERELAN PM) 360 MG 24 hr capsule Take 1 capsule by mouth at bedtime 30 capsule 0   No current facility-administered medications on file prior to visit.    BP (!) 150/84   Pulse 82   Temp (!) 97.3 F (36.3 C) (Temporal)   Ht 5\' 4"  (1.626 m)   Wt 283 lb (128.4 kg)   SpO2 99%   BMI 48.58 kg/m  Objective:   Physical Exam Cardiovascular:      Rate and Rhythm: Normal rate and regular rhythm.  Pulmonary:     Effort: Pulmonary effort is normal.     Breath sounds: Normal breath sounds.  Musculoskeletal:     Cervical back: Neck supple.  Skin:    General: Skin is warm and dry.          Assessment & Plan:      This visit occurred during the SARS-CoV-2 public health emergency.  Safety protocols were in place, including screening questions prior to the visit, additional usage of staff PPE, and extensive cleaning of exam room while observing appropriate contact time as indicated for disinfecting solutions.

## 2021-02-12 NOTE — Progress Notes (Signed)
@Patient  ID: Paige Hester, female    DOB: 10/03/1955, 65 y.o.   MRN: [id number]  Chief Complaint  Patient presents with   Follow-up    Sleep study results    Referring provider: Pleas Koch, NP  HPI: 65 year old female, former smoker quit in 1999 (27 pack year hx). PMH significant for HTN, pulmonary hypertension, cardiomegaly, aortic atherosclerosis, cardiac murmur, GERD, eczema, pre-diabetes, obesity. Patient of Dr. Halford Chessman, seen for initial sleep consult on 12/13/20 for snoring.   02/12/2021- Interim hx  Patient presents today to review sleep study results. Split night sleep study on 01/29/21 showed moderate OSA, event mostly during REM/ RDI 22/hr. Recommend CPAP therapy with pressure 8cm cm h20 with med nasal mask. Reviewed sleep study results. Treatment options include weight loss, oral appliance, CPAP or referral to ENT for possible surgical options. She is open to starting CPAP therapy. She is still having issues with shortness of breath since having pulmonary embolism. She is no longer on blood thinner, taking low dose aspirin. She is wanting to work on weight loss. She has recently started following with healthy weight and wellness. She is asking about pulmonary rehab.    No Known Allergies  Immunization History  Administered Date(s) Administered   Fluad Quad(high Dose 65+) 12/27/2020   Influenza Whole 04/01/2011   Influenza,inj,Quad PF,6+ Mos 11/30/2012, 12/22/2018   Moderna Sars-Covid-2 Vaccination 11/17/2019, 12/15/2019, 06/06/2020   PNEUMOCOCCAL CONJUGATE-20 10/18/2020   Tdap 05/31/2012   Zoster Recombinat (Shingrix) 10/12/2018, 12/22/2018    Past Medical History:  Diagnosis Date   Allergy    Bilateral swelling of feet and ankles    Gallbladder polyp 2012   GERD (gastroesophageal reflux disease)    Hyperlipidemia    Hypertension    Iron deficiency anemia    Menopausal symptoms    since 2007   Murmur, cardiac    Other fatigue    Prediabetes     Pulmonary embolism ([place]) 06/2020   Shortness of breath    Shortness of breath on exertion    Vitamin D deficiency     Tobacco History: Social History   Tobacco Use  Smoking Status Former   Packs/day: 1.00   Years: 27.00   Pack years: 27.00   Types: Cigarettes   Quit date: 1999   Years since quitting: 23.9  Smokeless Tobacco Never   Counseling given: Not Answered   Outpatient Medications Prior to Visit  Medication Sig Dispense Refill   albuterol (VENTOLIN HFA) 108 (90 Base) MCG/ACT inhaler Inhale 2 puffs into the lungs every 6 (six) hours as needed for wheezing or shortness of breath. 8 g 2   Cholecalciferol (VITAMIN D3) 1000 UNITS tablet Take 1,000 Units by mouth daily.     docusate sodium (COLACE) 100 MG capsule Take 1 capsule (100 mg total) by mouth 2 (two) times daily as needed for mild constipation or moderate constipation. 30 capsule 2   fluticasone (FLONASE) 50 MCG/ACT nasal spray Place 1 spray into both nostrils 2 (two) times daily as needed for allergies or rhinitis. 16 g 0   loratadine (CLARITIN) 10 MG tablet Take 10 mg by mouth daily.     losartan-hydrochlorothiazide (HYZAAR) 100-25 MG tablet Take 1 tablet by mouth daily. For blood pressure. 90 tablet 0   megestrol (MEGACE) 40 MG tablet TAKE 1 TABLET BY MOUTH TWICE DAILY CAN  INCREASE  TO  2  TABLETS  TWICE  DAILY  IN  THE  EVENT  OF  HEAVY  BLEEDING 60  tablet 5   Melatonin 10 MG TABS Take by mouth as needed.     omeprazole (PRILOSEC) 20 MG capsule Take 20 mg by mouth every other day.     Potassium 99 MG TABS Take 1 tablet by mouth in the morning and at bedtime.     rosuvastatin (CRESTOR) 20 MG tablet Take 1 tablet (20 mg total) by mouth daily. 90 tablet 3   verapamil (VERELAN PM) 360 MG 24 hr capsule Take 1 capsule by mouth at bedtime 30 capsule 0   No facility-administered medications prior to visit.   Review of Systems  Review of Systems  Constitutional: Negative.   HENT: Negative.    Respiratory: Negative.     Cardiovascular: Negative.     Physical Exam  BP (!) 152/84 (BP Location: Right Wrist, Cuff Size: Normal)   Pulse 69   Temp 98.3 F (36.8 C)   Ht 5\' 5"  (1.651 m)   Wt 284 lb (128.8 kg)   SpO2 99%   BMI 47.26 kg/m  Physical Exam Constitutional:      Appearance: Normal appearance.  HENT:     Head: Normocephalic and atraumatic.  Cardiovascular:     Rate and Rhythm: Normal rate and regular rhythm.     Heart sounds: Murmur heard.  Pulmonary:     Effort: Pulmonary effort is normal.     Breath sounds: Normal breath sounds.     Comments: CTA Musculoskeletal:        General: Normal range of motion.  Skin:    General: Skin is warm and dry.  Neurological:     General: No focal deficit present.     Mental Status: She is alert and oriented to person, place, and time. Mental status is at baseline.  Psychiatric:        Mood and Affect: Mood normal.        Behavior: Behavior normal.        Thought Content: Thought content normal.        Judgment: Judgment normal.     Lab Results:  CBC    Component Value Date/Time   WBC 5.5 12/13/2020 1805   RBC 4.57 12/13/2020 1805   HGB 13.0 12/13/2020 1805   HCT 38.9 12/13/2020 1805   PLT 237 12/13/2020 1805   MCV 85.1 12/13/2020 1805   MCH 28.4 12/13/2020 1805   MCHC 33.4 12/13/2020 1805   RDW 14.7 12/13/2020 1805   LYMPHSABS 2.1 12/13/2020 1805   MONOABS 0.5 12/13/2020 1805   EOSABS 0.3 12/13/2020 1805   BASOSABS 0.0 12/13/2020 1805    BMET    Component Value Date/Time   NA 139 02/12/2021 1428   K 3.7 02/12/2021 1428   CL 103 02/12/2021 1428   CO2 30 02/12/2021 1428   GLUCOSE 90 02/12/2021 1428   BUN 12 02/12/2021 1428   CREATININE 1.00 02/12/2021 1428   CALCIUM 10.2 02/12/2021 1428   GFRNONAA >60 12/13/2020 1805   GFRAA >60 07/25/2019 1500    BNP    Component Value Date/Time   BNP 24.7 12/13/2020 1805    ProBNP No results found for: PROBNP  Imaging: DG Bone Density  Result Date: 01/31/2021 EXAM: DUAL X-RAY  ABSORPTIOMETRY (DXA) FOR BONE MINERAL DENSITY IMPRESSION: Dear Dr. Carlis Abbott, Your patient CHELBI HERBER completed a FRAX assessment on 01/31/2021 using the [place] (analysis version: 14.10) manufactured by [employer]. The following summarizes the results of our evaluation. PATIENT BIOGRAPHICAL: Name: Yanni, Ruberg Patient ID: [id number] Birth  Date: [birth year] Height:    64.0 in. Gender:     Female    [age]       Weight:    281.0 lbs. Ethnicity:  Black                            Exam Date: 01/31/2021 FRAX* RESULTS:  (version: 3.5) 10-year Probability of Fracture1 Major Osteoporotic Fracture2 Hip Fracture 5.4% 0.5% Population: [place] (Black) Risk Factors: History of Fracture (Adult) Based on Femur (Left) Neck BMD 1 -The 10-year probability of fracture may be lower than reported if the patient has received treatment. 2 -Major Osteoporotic Fracture: Clinical Spine, Forearm, Hip or Shoulder *FRAX is a Materials engineer of the [employer] for Metabolic Bone Disease, a [place] (WHO) [facility]. ASSESSMENT: The probability of a major osteoporotic fracture is 5.4% within the next ten years. The probability of a hip fracture is 0.5% within the next ten years. . Your patient Anavictoria Wilk completed a BMD test on 01/31/2021 using the [place] (software version: 14.10) manufactured by [employer]. The following summarizes the results of our evaluation. Technologist: [facility] PATIENT BIOGRAPHICAL: Name: Elanie, Hammitt Patient ID: [id number] Birth Date: 03-May-1955 Height: 64.0 in. Gender: Female Exam Date: 01/31/2021 Weight: 281.0 lbs. Indications: History of Fracture (Adult), Postmenopausal Fractures: Left ankle, Right ankle Treatments: Vitamin D DENSITOMETRY RESULTS: Site         Region     Measured Date Measured Age WHO Classification Young Adult T-score BMD         %Change vs. Previous Significant Change (*)  AP Spine L1-L4 (L3) 01/31/2021 65.0 Normal 0.1 1.195 g/cm2 DualFemur Neck Left 01/31/2021 66.0 Osteopenia -1.4 0.849 g/cm2 Left Forearm Radius 33% 01/31/2021 66.0 Normal 1.9 1.045 g/cm2 ASSESSMENT: The BMD measured at Femur Neck Left is 0.849 g/cm2 with a T-score of -1.4. This patient is considered osteopenic according to [place] [facility]) criteria. The scan quality is good. L-3 was excluded due to degenerative changes. World Pharmacologist [facility]) criteria for post-menopausal, Caucasian Women: Normal:                   T-score at or above -1 SD Osteopenia/low bone mass: T-score between -1 and -2.5 SD Osteoporosis:             T-score at or below -2.5 SD RECOMMENDATIONS: 1. All patients should optimize calcium and vitamin D intake. 2. Consider FDA-approved medical therapies in postmenopausal women and men aged 23 years and older, based on the following: a. A hip or vertebral(clinical or morphometric) fracture b. T-score < -2.5 at the femoral neck or spine after appropriate evaluation to exclude secondary causes c. Low bone mass (T-score between -1.0 and -2.5 at the femoral neck or spine) and a 10-year probability of a hip fracture > 3% or a 10-year probability of a major osteoporosis-related fracture > 20% based on the [place]-adapted WHO algorithm 3. Clinician judgment and/or patient preferences may indicate treatment for people with 10-year fracture probabilities above or below these levels FOLLOW-UP: People with diagnosed cases of osteoporosis or at high risk for fracture should have regular bone mineral density tests. For patients eligible for Medicare, routine testing is allowed once every 2 years. The testing frequency can be increased to one year for patients who have rapidly progressing disease, those who are receiving or discontinuing medical therapy to  restore bone mass, or have additional risk factors. I have reviewed this report, and agree with the above findings. [facility]  Electronically Signed   By: Rolm Baptise M.D.   On: 01/31/2021 20:26   MM 3D SCREEN BREAST BILATERAL  Result Date: 01/31/2021 CLINICAL DATA:  Screening. EXAM: DIGITAL SCREENING BILATERAL MAMMOGRAM WITH TOMOSYNTHESIS AND CAD TECHNIQUE: Bilateral screening digital craniocaudal and mediolateral oblique mammograms were obtained. Bilateral screening digital breast tomosynthesis was performed. The images were evaluated with computer-aided detection. COMPARISON:  Previous exam(s). ACR Breast Density Category b: There are scattered areas of fibroglandular density. FINDINGS: In the left breast, a possible asymmetry warrants further evaluation. In the right breast, no findings suspicious for malignancy. IMPRESSION: Further evaluation is suggested for possible asymmetry in the left breast. RECOMMENDATION: Diagnostic mammogram and possibly ultrasound of the left breast. (Code:[id number]-L-47M) The patient will be contacted regarding the findings, and additional imaging will be scheduled. BI-RADS CATEGORY  0: Incomplete. Need additional imaging evaluation and/or prior mammograms for comparison. Electronically Signed   By: Nolon Nations M.D.   On: 01/31/2021 16:43  SLEEP STUDY DOCUMENTS  Result Date: 02/04/2021 Ordered by an unspecified provider.  SLEEP STUDY DOCUMENTS  Result Date: 01/31/2021 Ordered by an unspecified provider.    Assessment & Plan:   OSA (obstructive sleep apnea) - Original sleep consult in September 2022 for snoring. Split night sleep study on 01/29/21 showed moderate OSA, events mostly during REM/ RDI 22/hr. Discussed treatment options with patient. Due to significant cardiac history recommend patient be started on CPAP, optimal pressure 8cm h20 with medium nasal mask. Advised she aim to wear CPAP every night 4-6 hours or longer. FU in 3-4 months or sooner if needed.   Morbid obesity ([place]) - Encourage weight loss efforts, following with healthy weight and wellness  Dyspnea - Patient  reports having dyspnea symptoms since having pulmonary embolism. Following with heartcare. Referring to pulmonary rehab      Martyn Ehrich, NP 02/12/2021

## 2021-02-12 NOTE — Assessment & Plan Note (Addendum)
-   Patient reports having dyspnea symptoms since having pulmonary embolism. Following with heartcare. Referring to pulmonary rehab

## 2021-02-12 NOTE — Patient Instructions (Addendum)
Split night sleep study on 01/29/21 showed moderate obstructive sleep apnea, events were mostly during REM sleep. Recommend CPAP therapy with pressure 8cm cm h20 with med nasal mask.   Recommendations: Aim to wear CPAP every night min 4-6 hours or longer Work on weight loss efforts Do not drive if experiencing excessive daytime sleepiness/fatigue  Orders: New CPAP start at 8cm h20, medium nasal mask, supplies, heated humidity and enroll in [place]  Refer: Pulmonary rehab (ordered)  Follow-up: 4 months with Dr. Halford Chessman or beth NP    CPAP and BIPAP Information CPAP and BIPAP are methods that use air pressure to keep your airways open and to help you breathe well. CPAP and BIPAP use different amounts of pressure. Your health care provider will tell you whether CPAP or BIPAP would be more helpful for you. CPAP stands for "continuous positive airway pressure." With CPAP, the amount of pressure stays the same while you breathe in (inhale) and out (exhale). BIPAP stands for "bi-level positive airway pressure." With BIPAP, the amount of pressure will be higher when you inhale and lower when you exhale. This allows you to take larger breaths. CPAP or BIPAP may be used in the hospital, or your health care provider may want you to use it at home. You may need to have a sleep study before your health care provider can order a machine for you to use at home. What are the advantages? CPAP or BIPAP can be helpful if you have: Sleep apnea. Chronic obstructive pulmonary disease (COPD). Heart failure. Medical conditions that cause muscle weakness, including muscular dystrophy or amyotrophic lateral sclerosis (ALS). Other problems that cause breathing to be shallow, weak, abnormal, or difficult. CPAP and BIPAP are most commonly used for obstructive sleep apnea (OSA) to keep the airways from collapsing when the muscles relax during sleep. What are the risks? Generally, this is a safe treatment. However,  problems may occur, including: Irritated skin or skin sores if the mask does not fit properly. Dry or stuffy nose or nosebleeds. Dry mouth. Feeling gassy or bloated. Sinus or lung infection if the equipment is not cleaned properly. When should CPAP or BIPAP be used? In most cases, the mask only needs to be worn during sleep. Generally, the mask needs to be worn throughout the night and during any daytime naps. People with certain medical conditions may also need to wear the mask at other times, such as when they are awake. Follow instructions from your health care provider about when to use the machine. What happens during CPAP or BIPAP? Both CPAP and BIPAP are provided by a small machine with a flexible plastic tube that attaches to a plastic mask that you wear. Air is blown through the mask into your nose or mouth. The amount of pressure that is used to blow the air can be adjusted on the machine. Your health care provider will set the pressure setting and help you find the best mask for you. Tips for using the mask Because the mask needs to be snug, some people feel trapped or closed-in (claustrophobic) when first using the mask. If you feel this way, you may need to get used to the mask. One way to do this is to hold the mask loosely over your nose or mouth and then gradually apply the mask more snugly. You can also gradually increase the amount of time that you use the mask. Masks are available in various types and sizes. If your mask does not fit well, talk  with your health care provider about getting a different one. Some common types of masks include: Full face masks, which fit over the mouth and nose. Nasal masks, which fit over the nose. Nasal pillow or prong masks, which fit into the nostrils. If you are using a mask that fits over your nose and you tend to breathe through your mouth, a chin strap may be applied to help keep your mouth closed. Use a skin barrier to protect your skin as  told by your health care provider. Some CPAP and BIPAP machines have alarms that may sound if the mask comes off or develops a leak. If you have trouble with the mask, it is very important that you talk with your health care provider about finding a way to make the mask easier to tolerate. Do not stop using the mask. There could be a negative impact on your health if you stop using the mask. Tips for using the machine Place your CPAP or BIPAP machine on a secure table or stand near an electrical outlet. Know where the on/off switch is on the machine. Follow instructions from your health care provider about how to set the pressure on your machine and when you should use it. Do not eat or drink while the CPAP or BIPAP machine is on. Food or fluids could get pushed into your lungs by the pressure of the CPAP or BIPAP. For home use, CPAP and BIPAP machines can be rented or purchased through home health care companies. Many different brands of machines are available. Renting a machine before purchasing may help you find out which particular machine works well for you. Your health insurance company may also decide which machine you may get. Keep the CPAP or BIPAP machine and attachments clean. Ask your health care provider for specific instructions. Check the humidifier if you have a dry stuffy nose or nosebleeds. Make sure it is working correctly. Follow these instructions at home: Take over-the-counter and prescription medicines only as told by your health care provider. Ask if you can take sinus medicine if your sinuses are blocked. Do not use any products that contain nicotine or tobacco. These products include cigarettes, chewing tobacco, and vaping devices, such as e-cigarettes. If you need help quitting, ask your health care provider. Keep all follow-up visits. This is important. Contact a health care provider if: You have redness or pressure sores on your head, face, mouth, or nose from the mask or  head gear. You have trouble using the CPAP or BIPAP machine. You cannot tolerate wearing the CPAP or BIPAP mask. Someone tells you that you snore even when wearing your CPAP or BIPAP. Get help right away if: You have trouble breathing. You feel confused. Summary CPAP and BIPAP are methods that use air pressure to keep your airways open and to help you breathe well. If you have trouble with the mask, it is very important that you talk with your health care provider about finding a way to make the mask easier to tolerate. Do not stop using the mask. There could be a negative impact to your health if you stop using the mask. Follow instructions from your health care provider about when to use the machine. This information is not intended to replace advice given to you by your health care provider. Make sure you discuss any questions you have with your health care provider. Document Revised: 10/10/2020 Document Reviewed: 02/10/2020 Elsevier Patient Education  2022 [employer].

## 2021-02-12 NOTE — Assessment & Plan Note (Addendum)
Improved but still above goal. Suspect OSA contributing.   Continue losartan-HCTZ 100-25 mg. Consider switching verapamil to amlodipine for better BP control. Will consult with cardiology first.   If we switch, will have patient send BP readings via MyChart in 2 weeks.  Repeat BMP and lipids pending.

## 2021-02-12 NOTE — Assessment & Plan Note (Signed)
-   Encourage weight loss efforts, following with healthy weight and wellness

## 2021-02-12 NOTE — Patient Instructions (Addendum)
Stop by the lab prior to leaving today. I will notify you of your results once received.   I will be in touch regarding your verapamil blood pressure plan.   It was a pleasure to see you today!

## 2021-02-15 ENCOUNTER — Encounter (HOSPITAL_BASED_OUTPATIENT_CLINIC_OR_DEPARTMENT_OTHER): Payer: Medicare Other | Admitting: Pulmonary Disease

## 2021-02-19 ENCOUNTER — Other Ambulatory Visit: Payer: Self-pay

## 2021-02-19 ENCOUNTER — Ambulatory Visit
Admission: RE | Admit: 2021-02-19 | Discharge: 2021-02-19 | Disposition: A | Discharge status: Home / Self Care | Payer: Medicare Other | Source: Ambulatory Visit | Attending: Primary Care | Admitting: Primary Care

## 2021-02-19 ENCOUNTER — Ambulatory Visit (INDEPENDENT_AMBULATORY_CARE_PROVIDER_SITE_OTHER): Payer: Federal, State, Local not specified - PPO | Admitting: Family Medicine

## 2021-02-19 DIAGNOSIS — N6489 Other specified disorders of breast: Secondary | ICD-10-CM | POA: Insufficient documentation

## 2021-02-19 DIAGNOSIS — R928 Other abnormal and inconclusive findings on diagnostic imaging of breast: Secondary | ICD-10-CM | POA: Insufficient documentation

## 2021-02-20 ENCOUNTER — Encounter (INDEPENDENT_AMBULATORY_CARE_PROVIDER_SITE_OTHER): Payer: Self-pay | Admitting: Family Medicine

## 2021-02-20 ENCOUNTER — Ambulatory Visit (INDEPENDENT_AMBULATORY_CARE_PROVIDER_SITE_OTHER): Payer: Medicare Other | Admitting: Family Medicine

## 2021-02-20 VITALS — BP 178/77 | HR 73 | Temp 98.0°F | Ht 64.0 in | Wt 282.0 lb

## 2021-02-20 DIAGNOSIS — I10 Essential (primary) hypertension: Secondary | ICD-10-CM | POA: Diagnosis not present

## 2021-02-20 DIAGNOSIS — Z79899 Other long term (current) drug therapy: Secondary | ICD-10-CM

## 2021-02-20 DIAGNOSIS — E7849 Other hyperlipidemia: Secondary | ICD-10-CM

## 2021-02-20 DIAGNOSIS — Z6841 Body Mass Index (BMI) 40.0 and over, adult: Secondary | ICD-10-CM

## 2021-02-20 DIAGNOSIS — R7301 Impaired fasting glucose: Secondary | ICD-10-CM

## 2021-02-20 MED ORDER — OZEMPIC (0.25 OR 0.5 MG/DOSE) 2 MG/1.5ML ~~LOC~~ SOPN: 0.2500 mg | PEN_INJECTOR | SUBCUTANEOUS | 0 refills | Status: DC

## 2021-02-20 NOTE — Telephone Encounter (Signed)
Dr. Halford Chessman, please advise. Thanks   I am in the healthy weight and wellness program that [facility] health offers and Dr Ardyth Man(?) suggested I asked you if you would refer me to pulmonary rehabilitation.  She said the one in [place] does not have a waiting list, so can you send them a referral for me please? Thank you!

## 2021-02-20 NOTE — Progress Notes (Signed)
Chief Complaint:   OBESITY Paige Hester is here to discuss her progress with her obesity treatment plan along with follow-up of her obesity related diagnoses. See Medical Weight Management Flowsheet for complete bioelectrical impedance results.  Today's visit was #: 2 Starting weight: 280 lbs Starting date: 01/22/2021 Weight change since last visit: +2 lbs Total lbs lost to date: +2 lbs  Nutrition Plan: Category 2 Plan for 20% of the time.  Activity: None.  Interim History: Paige Hester was already referred to Pulmonary Rehab for PE that was thought to be due to travel.  She was on Eliquis for 3 months.  She is not checking blood pressure at home.  She is up 40 pounds since COVID.  She has 1 glass of wine per night.  She says she will be going to [place] for Christmas night with her sister.  Assessment/Plan:   1. Essential hypertension Not at goal. Medications: Hyzaar 100-25 mg dailiy, verapamil 360 mg daily.   Plan: Avoid buying foods that are: processed, frozen, or prepackaged to avoid excess salt. We will watch for signs of hypotension as she continues lifestyle modifications.  BP Readings from Last 3 Encounters:  02/20/21 (!) 178/77  02/12/21 (!) 152/84  02/12/21 (!) 150/84   Lab Results  Component Value Date   CREATININE 1.00 02/12/2021   2. Other hyperlipidemia Course: At goal. Lipid-lowering medications: Crestor 20 mg daily.   Plan: Dietary changes: Increase soluble fiber, decrease simple carbohydrates, decrease saturated fat. Exercise changes: Moderate to vigorous-intensity aerobic activity 150 minutes per week or as tolerated. We will continue to monitor along with PCP/specialists as it pertains to her weight loss journey.  Lab Results  Component Value Date   CHOL 114 02/12/2021   HDL 57.00 02/12/2021   LDLCALC 47 02/12/2021   TRIG 46.0 02/12/2021   CHOLHDL 2 02/12/2021   Lab Results  Component Value Date   ALT 31 12/13/2020   AST 30 12/13/2020   ALKPHOS 63  12/13/2020   BILITOT 0.7 12/13/2020   3. Medication management Will work on decreasing obesogenic medications.  4. Impaired fasting glucose, with polyphagia Not at goal. Current treatment: None.    Plan:  Start Ozempic 0.25 mg subcutaneously weekly, as per below.  She will continue to focus on protein-rich, low simple carbohydrate foods. We reviewed the importance of hydration, regular exercise for stress reduction, and restorative sleep.  - Start Semaglutide,0.25 or 0.5MG /DOS, (OZEMPIC, 0.25 OR 0.5 MG/DOSE,) 2 MG/1.5ML SOPN; Inject 0.25 mg into the skin once a week.  Dispense: 1.5 mL; Refill: 0  5. Obesity with current BMI of 48.4  Course: Paige Hester is currently in the action stage of change. As such, her goal is to continue with weight loss efforts.   Nutrition goals: She has agreed to keeping a food journal and adhering to recommended goals of 1200 calories and 95 grams of protein.   Exercise goals:  Pulmonary Rehab.  Behavioral modification strategies: increasing lean protein intake, decreasing simple carbohydrates, increasing vegetables, and increasing water intake.  Paige Hester has agreed to follow-up with our clinic in 4-6 weeks. She was informed of the importance of frequent follow-up visits to maximize her success with intensive lifestyle modifications for her multiple health conditions.   Objective:   Blood pressure (!) 178/77, pulse 73, temperature 98 F (36.7 C), temperature source Oral, height 5\' 4"  (1.626 m), weight 282 lb (127.9 kg), SpO2 96 %. Body mass index is 48.41 kg/m.  General: Cooperative, alert, well developed, in no acute  distress. HEENT: Conjunctivae and lids unremarkable. Cardiovascular: Regular rhythm.  Lungs: Normal work of breathing. Neurologic: No focal deficits.   Lab Results  Component Value Date   CREATININE 1.00 02/12/2021   BUN 12 02/12/2021   NA 139 02/12/2021   K 3.7 02/12/2021   CL 103 02/12/2021   CO2 30 02/12/2021   Lab Results   Component Value Date   ALT 31 12/13/2020   AST 30 12/13/2020   ALKPHOS 63 12/13/2020   BILITOT 0.7 12/13/2020   Lab Results  Component Value Date   HGBA1C 5.6 01/22/2021   HGBA1C 6.1 10/18/2020   HGBA1C 6.0 11/18/2019   HGBA1C 5.9 (A) 03/03/2019   HGBA1C 6.2 08/27/2018   Lab Results  Component Value Date   INSULIN 14.8 01/22/2021   Lab Results  Component Value Date   TSH 2.930 01/22/2021   Lab Results  Component Value Date   CHOL 114 02/12/2021   HDL 57.00 02/12/2021   LDLCALC 47 02/12/2021   TRIG 46.0 02/12/2021   CHOLHDL 2 02/12/2021   Lab Results  Component Value Date   VD25OH 30.8 01/22/2021   VD25OH 54.66 10/10/2015   VD25OH 26 (L) 09/13/2008   Lab Results  Component Value Date   WBC 5.5 12/13/2020   HGB 13.0 12/13/2020   HCT 38.9 12/13/2020   MCV 85.1 12/13/2020   PLT 237 12/13/2020   Attestation Statements:   Reviewed by clinician on day of visit: allergies, medications, problem list, medical history, surgical history, family history, social history, and previous encounter notes.  Time spent on visit including pre-visit chart review and post-visit care and documentation was 43 minutes.Time was spent on: Food choices and timing of food intake reviewed today. I discussed a personalized meal plan with the patient that will help her to lose weight and will improve her obesity-related conditions going forward. I performed a medically necessary appropriate examination and/or evaluation. I discussed the assessment and treatment plan with the patient. Motivational interviewing as well as evidence-based interventions for health behavior change were utilized today including the discussion of self monitoring techniques, problem-solving barriers and SMART goal setting techniques.  An exercise prescription was reviewed.  The patient was provided an opportunity to ask questions and all were answered. The patient agreed with the plan and demonstrated an understanding of the  instructions. Clinical information was updated and documented in the EMR.  I, Water quality scientist, CMA, am acting as transcriptionist for Briscoe Deutscher, DO  I have reviewed the above documentation for accuracy and completeness, and I agree with the above. -  Briscoe Deutscher, DO, MS, FAAFP, DABOM - Family and Bariatric Medicine.

## 2021-02-20 NOTE — Telephone Encounter (Signed)
Okay to send referral for pulmonary rehab in [place].

## 2021-02-21 ENCOUNTER — Telehealth (HOSPITAL_COMMUNITY): Payer: Self-pay

## 2021-02-21 ENCOUNTER — Encounter (HOSPITAL_COMMUNITY): Payer: Self-pay

## 2021-02-21 NOTE — Telephone Encounter (Signed)
Pt prefers to go to [facility] for pulmonary rehab, fax pt demographics over. Closed referral

## 2021-02-21 NOTE — Telephone Encounter (Signed)
Attempted to call patient in regards to Pulmonary Rehab - LM on VM °

## 2021-02-21 NOTE — Telephone Encounter (Signed)
Pulmonary rehab office referral recv'ed, printed and given to RN for review. 

## 2021-02-25 ENCOUNTER — Other Ambulatory Visit: Payer: Self-pay

## 2021-02-25 ENCOUNTER — Telehealth: Payer: Self-pay | Admitting: Primary Care

## 2021-02-25 ENCOUNTER — Encounter: Payer: Federal, State, Local not specified - PPO | Attending: Pulmonary Disease | Admitting: *Deleted

## 2021-02-25 DIAGNOSIS — R0600 Dyspnea, unspecified: Secondary | ICD-10-CM

## 2021-02-25 NOTE — Progress Notes (Signed)
Initial telephone orientation completed. Diagnosis can be found in [facility] 11/29. EP Orientation scheduled for Monday 1/9 at 3pm

## 2021-02-25 NOTE — Telephone Encounter (Signed)
Pt called asking if Carlis Abbott was going to change medication losartan-hydrochlorothiazide (HYZAAR) 100-25 MG tablet, or do she wants pt to stay on the medication. Pt states that she would be going out of town on 02/27/21 and would need her medication. Please advise.

## 2021-02-26 DIAGNOSIS — I10 Essential (primary) hypertension: Secondary | ICD-10-CM

## 2021-02-26 MED ORDER — AMLODIPINE BESYLATE 10 MG PO TABS: 10.0000 mg | ORAL_TABLET | Freq: Every day | ORAL | 0 refills | Status: DC

## 2021-02-26 NOTE — Telephone Encounter (Signed)
Please notify patient:  Per my lab result note (that she viewed on 02/18/21), I needed an updated BMP.  Per the my chart conversation in late November 2022, I was waiting for her to confirm that she was okay to switch from verapamil to amlodipine.   Needs BMP today if possible. Okay to stop verapamil and move to amlodipine? What are BP levels running? Needs office visit (about 2-3 weeks)

## 2021-02-26 NOTE — Telephone Encounter (Signed)
Left message to return call to our office.  

## 2021-03-01 ENCOUNTER — Encounter (INDEPENDENT_AMBULATORY_CARE_PROVIDER_SITE_OTHER): Payer: Self-pay | Admitting: Family Medicine

## 2021-03-04 NOTE — Telephone Encounter (Signed)
Pt last seen by Dr. Wallace.  

## 2021-03-04 NOTE — Telephone Encounter (Signed)
Left message to return call to our office.  

## 2021-03-07 NOTE — Telephone Encounter (Signed)
She responded to me via MyChart, thanks.

## 2021-03-07 NOTE — Telephone Encounter (Signed)
I have been calling patient 3rd call today not able to reach

## 2021-03-19 ENCOUNTER — Ambulatory Visit: Payer: Medicare Other | Admitting: Cardiovascular Disease

## 2021-03-24 NOTE — Progress Notes (Signed)
Cardiology Office Note  Date:  03/25/2021   ID:  Paige Hester, DOB 1955/06/18, MRN [id number]  PCP:  Pleas Koch, NP   Chief Complaint  Patient presents with   3 month follow up     Patient c/o chest heaviness & shortness of breath. Medications reviewed by the patient verbally.     HPI:  [name] is a 66 year old woman with past medical history of Abdominal pain Prior smoker, quit 1999 (smoked 37 years) Acute PE On CT: moderate aortic atheroma in the arch, mild distally, at least moderate coronary calcification noted Presenting for f/u of his pulmonary embolism, PAD, aortic atheroma  LOV 08/2020  Overall doing well, recent travel to [facility] to see family In general no new cardiac issues  Discussed recent medication changes Changed from verapamil to amlodipine by PMD Stopped megace started by OB/GYN  Wearing compression socks Not on anticoagulation  EKG personally reviewed by myself on todays visit Shows normal sinus rhythm rate 77 bpm T wave abnormality V6, 1 and aVL  Other past medical history reviewed Traveling back and forth to [place], car and train SOB sx in Jan 2022 Swelling feb and march Seen in the hospital April 2022  intermittent lower extremity swelling and dyspnea on exertion, worse the past several days after a long car drive to [place].    CTA shows small subsegmental PE w/o evidence right heart strain.  Monitored overnight  tentative plan for minimum of 3 months anticoagulation.  CT scan chest July 11, 2020, images pulled up and reviewed in the office today 1. Positive for isolated subsegmental pulmonary embolus in the left upper lobe. 2. Prominence of the main pulmonary artery suggesting pulmonary arterial hypertension. 3. Cardiomegaly with coronary artery calcifications. 4. Mild heterogeneous pulmonary parenchyma, can be seen with small airways disease. 5. Incidental note of gallstones.  Echocardiogram results  reviewed in detail  1. Left ventricular ejection fraction, by estimation, is 60 to 65%. The  left ventricle has normal function. The left ventricle has no regional  wall motion abnormalities. Left ventricular diastolic parameters are  consistent with Grade II diastolic  dysfunction (pseudonormalization). The average left ventricular global  longitudinal strain is -15.5 %.   2. Right ventricular systolic function is normal. The right ventricular  size is normal.   3. Left atrial size was mildly dilated.    PMH:   has a past medical history of Allergy, Bilateral swelling of feet and ankles, Gallbladder polyp (2012), GERD (gastroesophageal reflux disease), Hyperlipidemia, Hypertension, Iron deficiency anemia, Menopausal symptoms, Murmur, cardiac, Other fatigue, Prediabetes, Pulmonary embolism ([place]) (06/2020), Shortness of breath, Shortness of breath on exertion, and Vitamin D deficiency.  PSH:    Past Surgical History:  Procedure Laterality Date   COLONOSCOPY  2010   2020   HYSTEROSCOPY WITH D & C N/A 07/27/2019   Procedure: DILATATION AND CURETTAGE /HYSTEROSCOPY, Polypectomy;  Surgeon: Osborne Oman, MD;  Location: [facility];  Service: Gynecology;  Laterality: N/A;    Current Outpatient Medications  Medication Sig Dispense Refill   albuterol (VENTOLIN HFA) 108 (90 Base) MCG/ACT inhaler Inhale 2 puffs into the lungs every 6 (six) hours as needed for wheezing or shortness of breath. 8 g 2   amLODipine (NORVASC) 10 MG tablet Take 1 tablet (10 mg total) by mouth daily. For blood pressure. 90 tablet 0   Cholecalciferol (VITAMIN D3) 1000 UNITS tablet Take 1,000 Units by mouth daily.     docusate sodium (COLACE) 100 MG  capsule Take 1 capsule (100 mg total) by mouth 2 (two) times daily as needed for mild constipation or moderate constipation. 30 capsule 2   fluticasone (FLONASE) 50 MCG/ACT nasal spray Place 1 spray into both nostrils 2 (two) times daily as needed for allergies  or rhinitis. 16 g 0   loratadine (CLARITIN) 10 MG tablet Take 10 mg by mouth daily.     losartan-hydrochlorothiazide (HYZAAR) 100-25 MG tablet Take 1 tablet by mouth daily. For blood pressure. 90 tablet 0   Melatonin 10 MG TABS Take by mouth as needed.     omeprazole (PRILOSEC) 20 MG capsule Take 20 mg by mouth every other day.     Potassium 99 MG TABS Take 1 tablet by mouth in the morning and at bedtime.     rosuvastatin (CRESTOR) 20 MG tablet Take 1 tablet (20 mg total) by mouth daily. 90 tablet 3   Semaglutide,0.25 or 0.5MG /DOS, (OZEMPIC, 0.25 OR 0.5 MG/DOSE,) 2 MG/1.5ML SOPN Inject 0.25 mg into the skin once a week. 1.5 mL 0   megestrol (MEGACE) 40 MG tablet TAKE 1 TABLET BY MOUTH TWICE DAILY CAN  INCREASE  TO  2  TABLETS  TWICE  DAILY  IN  THE  EVENT  OF  HEAVY  BLEEDING (Patient not taking: Reported on 02/25/2021) 60 tablet 5   No current facility-administered medications for this visit.     Allergies:   Patient has no known allergies.   Social History:  The patient  reports that she quit smoking about 24 years ago. Her smoking use included cigarettes. She has a 27.00 pack-year smoking history. She has never used smokeless tobacco. She reports current alcohol use of about 7.0 standard drinks per week. She reports that she does not use drugs.   Family History:   family history includes Cancer in her father; Colon cancer in her maternal aunt; Coronary artery disease in her sister; Coronary artery disease (age of onset: 31) in her sister; Depression in her mother and another family member; Heart attack (age of onset: 57) in her mother; Heart disease in her brother and mother; Heart disease (age of onset: 82) in her father; Hypertension in her mother; Prostate cancer in her father; Stroke in her mother; Sudden death in her mother and another family member.    Review of Systems: Review of Systems  Constitutional: Negative.   HENT: Negative.    Respiratory: Negative.    Cardiovascular:   Positive for leg swelling.  Gastrointestinal: Negative.   Musculoskeletal: Negative.   Neurological: Negative.   Psychiatric/Behavioral: Negative.    All other systems reviewed and are negative.   PHYSICAL EXAM: VS:  BP 122/70 (BP Location: Right Arm, Patient Position: Sitting, Cuff Size: Large)    Pulse 77    Ht 5\' 5"  (1.651 m)    Wt 289 lb (131.1 kg)    SpO2 98%    BMI 48.09 kg/m  , BMI Body mass index is 48.09 kg/m. Constitutional:  oriented to person, place, and time. No distress.  HENT:  Head: Grossly normal Eyes:  no discharge. No scleral icterus.  Neck: No JVD, no carotid bruits  Cardiovascular: Regular rate and rhythm, no murmurs appreciated Pulmonary/Chest: Clear to auscultation bilaterally, no wheezes or rails Abdominal: Soft.  no distension.  no tenderness.  Musculoskeletal: Normal range of motion Neurological:  normal muscle tone. Coordination normal. No atrophy Skin: Skin warm and dry Psychiatric: normal affect, pleasant  Recent Labs: 07/12/2020: Magnesium 2.0 12/13/2020: ALT 31; B Natriuretic Peptide  24.7; Hemoglobin 13.0; Platelets 237 01/22/2021: TSH 2.930 02/12/2021: BUN 12; Creatinine, Ser 1.00; Potassium 3.7; Sodium 139    Lipid Panel Lab Results  Component Value Date   CHOL 114 02/12/2021   HDL 57.00 02/12/2021   LDLCALC 47 02/12/2021   TRIG 46.0 02/12/2021      Wt Readings from Last 3 Encounters:  03/25/21 289 lb (131.1 kg)  02/20/21 282 lb (127.9 kg)  02/12/21 284 lb (128.8 kg)     ASSESSMENT AND PLAN:  Problem List Items Addressed This Visit       Cardiology Problems   Essential hypertension   Aortic atherosclerosis ([place])   Pulmonary hypertension ([place])   Other Visit Diagnoses     PAD (peripheral artery disease) ([place])    -  Primary   SOB (shortness of breath)       Acute pulmonary embolism, unspecified pulmonary embolism type, unspecified whether acute cor pulmonale present ([place])         Pulmonary embolism Episode in January and  February after car trip and plane rides leading to DVT and PE -We have recommended prophylactic dosing with Eliquis 2.5 twice daily as needed for long car trips or plane rides -Currently wearing compression hose  Essential hypertension Blood pressure is well controlled on today's visit. No changes made to the medications.  Carotid/subclavian disease, bruit appreciated last done in 6/22 Mild plaque bilaterally less than 50% range No significant change  Aortic atherosclerosis At least moderate aortic atheroma in the arch, mild distally, at least moderate coronary calcification noted Cholesterol at goal   Total encounter time more than 25 minutes  Greater than 50% was spent in counseling and coordination of care with the patient    Signed, Esmond Plants, M.D., Ph.D. [place], [place]

## 2021-03-25 ENCOUNTER — Ambulatory Visit (INDEPENDENT_AMBULATORY_CARE_PROVIDER_SITE_OTHER): Payer: Medicare Other | Admitting: Cardiovascular Disease

## 2021-03-25 ENCOUNTER — Encounter: Payer: Self-pay | Admitting: Cardiovascular Disease

## 2021-03-25 ENCOUNTER — Encounter: Payer: Federal, State, Local not specified - PPO | Attending: Pulmonary Disease | Admitting: *Deleted

## 2021-03-25 ENCOUNTER — Other Ambulatory Visit: Payer: Self-pay

## 2021-03-25 VITALS — BP 122/70 | HR 77 | Ht 65.0 in | Wt 289.0 lb

## 2021-03-25 VITALS — Ht 65.75 in | Wt 288.1 lb

## 2021-03-25 DIAGNOSIS — I700 Atherosclerosis of aorta: Secondary | ICD-10-CM

## 2021-03-25 DIAGNOSIS — I739 Peripheral vascular disease, unspecified: Secondary | ICD-10-CM

## 2021-03-25 DIAGNOSIS — I272 Pulmonary hypertension, unspecified: Secondary | ICD-10-CM

## 2021-03-25 DIAGNOSIS — I10 Essential (primary) hypertension: Secondary | ICD-10-CM | POA: Diagnosis not present

## 2021-03-25 DIAGNOSIS — R0602 Shortness of breath: Secondary | ICD-10-CM | POA: Diagnosis not present

## 2021-03-25 DIAGNOSIS — I2699 Other pulmonary embolism without acute cor pulmonale: Secondary | ICD-10-CM

## 2021-03-25 DIAGNOSIS — R0600 Dyspnea, unspecified: Secondary | ICD-10-CM | POA: Insufficient documentation

## 2021-03-25 MED ORDER — APIXABAN 2.5 MG PO TABS: 2.5000 mg | ORAL_TABLET | Freq: Two times a day (BID) | ORAL | 3 refills | Status: DC | PRN

## 2021-03-25 NOTE — Patient Instructions (Signed)
Patient Instructions  Patient Details  Name: Paige Hester MRN: [id number] Date of Birth: 1955-11-09 Referring Provider:  Chesley Mires, MD  Below are your personal goals for exercise, nutrition, and risk factors. Our goal is to help you stay on track towards obtaining and maintaining these goals. We will be discussing your progress on these goals with you throughout the program.  Initial Exercise Prescription:  Initial Exercise Prescription - 03/25/21 1600       Date of Initial Exercise RX and Referring Provider   Date 03/25/21    Referring Provider Dr. Chesley Mires      Oxygen   Maintain Oxygen Saturation 88% or higher      Treadmill   MPH 1.2    Grade 0    Minutes 15    METs 1.92      T5 Nustep   Level 1    SPM 80    Minutes 15    METs 1.94      Biostep-RELP   Level 1    SPM 50    Minutes 15    METs 1.94      Track   Laps 10    Minutes 15    METs 1.54      Prescription Details   Frequency (times per week) 3    Duration Progress to 30 minutes of continuous aerobic without signs/symptoms of physical distress      Intensity   THRR 40-80% of Max Heartrate 112-140    Ratings of Perceived Exertion 11-13    Perceived Dyspnea 0-4      Progression   Progression Continue to progress workloads to maintain intensity without signs/symptoms of physical distress.      Resistance Training   Training Prescription Yes    Weight 3    Reps 10-15             Exercise Goals: Frequency: Be able to perform aerobic exercise two to three times per week in program working toward 2-5 days per week of home exercise.  Intensity: Work with a perceived exertion of 11 (fairly light) - 15 (hard) while following your exercise prescription.  We will make changes to your prescription with you as you progress through the program.   Duration: Be able to do 30 to 45 minutes of continuous aerobic exercise in addition to a 5 minute warm-up and a 5 minute cool-down routine.    Nutrition Goals: Your personal nutrition goals will be established when you do your nutrition analysis with the dietician.  The following are general nutrition guidelines to follow: Cholesterol < 200mg /day Sodium < 1500mg /day Fiber: Women over 50 yrs - 21 grams per day  Personal Goals:  Personal Goals and Risk Factors at Admission - 03/25/21 1705       Core Components/Risk Factors/Patient Goals on Admission    Weight Management Yes;Obesity;Weight Loss    Intervention Weight Management: Develop a combined nutrition and exercise program designed to reach desired caloric intake, while maintaining appropriate intake of nutrient and fiber, sodium and fats, and appropriate energy expenditure required for the weight goal.;Weight Management: Provide education and appropriate resources to help participant work on and attain dietary goals.;Weight Management/Obesity: Establish reasonable short term and long term weight goals.;Obesity: Provide education and appropriate resources to help participant work on and attain dietary goals.    Admit Weight 288 lb 1.6 oz (130.7 kg)    Goal Weight: Short Term 275 lb (124.7 kg)    Goal Weight: Long Term 260 lb (  117.9 kg)    Expected Outcomes Short Term: Continue to assess and modify interventions until short term weight is achieved;Long Term: Adherence to nutrition and physical activity/exercise program aimed toward attainment of established weight goal;Weight Loss: Understanding of general recommendations for a balanced deficit meal plan, which promotes 1-2 lb weight loss per week and includes a negative energy balance of [phone] kcal/d;Understanding recommendations for meals to include 15-35% energy as protein, 25-35% energy from fat, 35-60% energy from carbohydrates, less than 200mg  of dietary cholesterol, 20-35 gm of total fiber daily;Understanding of distribution of calorie intake throughout the day with the consumption of 4-5 meals/snacks    Improve shortness of  breath with ADL's Yes    Intervention Provide education, individualized exercise plan and daily activity instruction to help decrease symptoms of SOB with activities of daily living.    Expected Outcomes Short Term: Improve cardiorespiratory fitness to achieve a reduction of symptoms when performing ADLs;Long Term: Be able to perform more ADLs without symptoms or delay the onset of symptoms    Increase knowledge of respiratory medications and ability to use respiratory devices properly  Yes    Intervention Provide education and demonstration as needed of appropriate use of medications, inhalers, and oxygen therapy.    Expected Outcomes Short Term: Achieves understanding of medications use. Understands that oxygen is a medication prescribed by physician. Demonstrates appropriate use of inhaler and oxygen therapy.;Long Term: Maintain appropriate use of medications, inhalers, and oxygen therapy.    Hypertension Yes    Intervention Provide education on lifestyle modifcations including regular physical activity/exercise, weight management, moderate sodium restriction and increased consumption of fresh fruit, vegetables, and low fat dairy, alcohol moderation, and smoking cessation.;Monitor prescription use compliance.    Expected Outcomes Short Term: Continued assessment and intervention until BP is < 140/21mm HG in hypertensive participants. < 130/28mm HG in hypertensive participants with diabetes, heart failure or chronic kidney disease.;Long Term: Maintenance of blood pressure at goal levels.    Lipids Yes    Intervention Provide education and support for participant on nutrition & aerobic/resistive exercise along with prescribed medications to achieve LDL 70mg , HDL >40mg .    Expected Outcomes Short Term: Participant states understanding of desired cholesterol values and is compliant with medications prescribed. Participant is following exercise prescription and nutrition guidelines.;Long Term: Cholesterol  controlled with medications as prescribed, with individualized exercise RX and with personalized nutrition plan. Value goals: LDL < 70mg , HDL > 40 mg.             Tobacco Use Initial Evaluation: Social History   Tobacco Use  Smoking Status Former   Packs/day: 1.00   Years: 27.00   Pack years: 27.00   Types: Cigarettes   Quit date: 1999   Years since quitting: 24.0  Smokeless Tobacco Never    Exercise Goals and Review:  Exercise Goals     Row Name 03/25/21 1703             Exercise Goals   Increase Physical Activity Yes       Intervention Provide advice, education, support and counseling about physical activity/exercise needs.;Develop an individualized exercise prescription for aerobic and resistive training based on initial evaluation findings, risk stratification, comorbidities and participant's personal goals.       Expected Outcomes Short Term: Attend rehab on a regular basis to increase amount of physical activity.;Long Term: Add in home exercise to make exercise part of routine and to increase amount of physical activity.;Long Term: Exercising regularly at least 3-5 days  a week.       Increase Strength and Stamina Yes       Intervention Provide advice, education, support and counseling about physical activity/exercise needs.;Develop an individualized exercise prescription for aerobic and resistive training based on initial evaluation findings, risk stratification, comorbidities and participant's personal goals.       Expected Outcomes Short Term: Increase workloads from initial exercise prescription for resistance, speed, and METs.;Short Term: Perform resistance training exercises routinely during rehab and add in resistance training at home;Long Term: Improve cardiorespiratory fitness, muscular endurance and strength as measured by increased METs and functional capacity (6MWT)       Able to understand and use rate of perceived exertion (RPE) scale Yes       Intervention  Provide education and explanation on how to use RPE scale       Expected Outcomes Short Term: Able to use RPE daily in rehab to express subjective intensity level;Long Term:  Able to use RPE to guide intensity level when exercising independently       Able to understand and use Dyspnea scale Yes       Intervention Provide education and explanation on how to use Dyspnea scale       Expected Outcomes Short Term: Able to use Dyspnea scale daily in rehab to express subjective sense of shortness of breath during exertion;Long Term: Able to use Dyspnea scale to guide intensity level when exercising independently       Knowledge and understanding of Target Heart Rate Range (THRR) Yes       Intervention Provide education and explanation of THRR including how the numbers were predicted and where they are located for reference       Expected Outcomes Short Term: Able to state/look up THRR;Long Term: Able to use THRR to govern intensity when exercising independently;Short Term: Able to use daily as guideline for intensity in rehab       Able to check pulse independently Yes       Intervention Provide education and demonstration on how to check pulse in carotid and radial arteries.;Review the importance of being able to check your own pulse for safety during independent exercise       Expected Outcomes Short Term: Able to explain why pulse checking is important during independent exercise;Long Term: Able to check pulse independently and accurately       Understanding of Exercise Prescription Yes       Intervention Provide education, explanation, and written materials on patient's individual exercise prescription       Expected Outcomes Short Term: Able to explain program exercise prescription;Long Term: Able to explain home exercise prescription to exercise independently                Copy of goals given to participant.

## 2021-03-25 NOTE — Progress Notes (Signed)
Pulmonary Individual Treatment Plan  Patient Details  Name: Paige Hester MRN: [id number] Date of Birth: 04/29/1955 Referring Provider:   Flowsheet Row Pulmonary Rehab from 03/25/2021 in [facility] Cardiac and Pulmonary Rehab  Referring Provider Dr. Chesley Mires       Initial Encounter Date:  Flowsheet Row Pulmonary Rehab from 03/25/2021 in [facility] Cardiac and Pulmonary Rehab  Date 03/25/21       Visit Diagnosis: Dyspnea, unspecified type  Patient's Home Medications on Admission:  Current Outpatient Medications:    albuterol (VENTOLIN HFA) 108 (90 Base) MCG/ACT inhaler, Inhale 2 puffs into the lungs every 6 (six) hours as needed for wheezing or shortness of breath., Disp: 8 g, Rfl: 2   amLODipine (NORVASC) 10 MG tablet, Take 1 tablet (10 mg total) by mouth daily. For blood pressure., Disp: 90 tablet, Rfl: 0   apixaban (ELIQUIS) 2.5 MG TABS tablet, Take 1 tablet (2.5 mg total) by mouth 2 (two) times daily as needed. For DVT prevention during travel, Disp: 60 tablet, Rfl: 3   Cholecalciferol (VITAMIN D3) 1000 UNITS tablet, Take 1,000 Units by mouth daily., Disp: , Rfl:    docusate sodium (COLACE) 100 MG capsule, Take 1 capsule (100 mg total) by mouth 2 (two) times daily as needed for mild constipation or moderate constipation., Disp: 30 capsule, Rfl: 2   fluticasone (FLONASE) 50 MCG/ACT nasal spray, Place 1 spray into both nostrils 2 (two) times daily as needed for allergies or rhinitis., Disp: 16 g, Rfl: 0   loratadine (CLARITIN) 10 MG tablet, Take 10 mg by mouth daily., Disp: , Rfl:    losartan-hydrochlorothiazide (HYZAAR) 100-25 MG tablet, Take 1 tablet by mouth daily. For blood pressure., Disp: 90 tablet, Rfl: 0   Melatonin 10 MG TABS, Take by mouth as needed., Disp: , Rfl:    omeprazole (PRILOSEC) 20 MG capsule, Take 20 mg by mouth every other day., Disp: , Rfl:    Potassium 99 MG TABS, Take 1 tablet by mouth in the morning and at bedtime., Disp: , Rfl:    rosuvastatin (CRESTOR) 20 MG  tablet, Take 1 tablet (20 mg total) by mouth daily., Disp: 90 tablet, Rfl: 3   Semaglutide,0.25 or 0.5MG /DOS, (OZEMPIC, 0.25 OR 0.5 MG/DOSE,) 2 MG/1.5ML SOPN, Inject 0.25 mg into the skin once a week., Disp: 1.5 mL, Rfl: 0  Past Medical History: Past Medical History:  Diagnosis Date   Allergy    Bilateral swelling of feet and ankles    Gallbladder polyp 2012   GERD (gastroesophageal reflux disease)    Hyperlipidemia    Hypertension    Iron deficiency anemia    Menopausal symptoms    since 2007   Murmur, cardiac    Other fatigue    Prediabetes    Pulmonary embolism ([place]) 06/2020   Shortness of breath    Shortness of breath on exertion    Vitamin D deficiency     Tobacco Use: Social History   Tobacco Use  Smoking Status Former   Packs/day: 1.00   Years: 27.00   Pack years: 27.00   Types: Cigarettes   Quit date: 1999   Years since quitting: 24.0  Smokeless Tobacco Never    Labs: Recent Review Scientist, physiological     Labs for ITP Cardiac and Pulmonary Rehab Latest Ref Rng & Units 03/03/2019 11/18/2019 10/18/2020 01/22/2021 02/12/2021   Cholestrol 0 - 200 mg/dL - 158 108 130 114   LDLCALC 0 - 99 mg/dL - 105(H) 54 58 47   HDL >39.00  mg/dL - 42.10 45.60 61 57.00   Trlycerides 0.0 - 149.0 mg/dL - 52.0 43.0 44 46.0   Hemoglobin A1c 4.8 - 5.6 % 5.9(A) 6.0 6.1 5.6 -        Pulmonary Assessment Scores:  Pulmonary Assessment Scores     Row Name 03/25/21 1710         ADL UCSD   SOB Score total 72     Rest 0     Walk 4     Stairs 5     Bath 2     Dress 3     Shop 4       CAT Score   CAT Score 19       mMRC Score   mMRC Score 3              UCSD: Self-administered rating of dyspnea associated with activities of daily living (ADLs) 6-point scale (0 = "not at all" to 5 = "maximal or unable to do because of breathlessness")  Scoring Scores range from 0 to 120.  Minimally important difference is 5 units  CAT: CAT can identify the health impairment of COPD  patients and is better correlated with disease progression.  CAT has a scoring range of zero to 40. The CAT score is classified into four groups of low (less than 10), medium (10 - 20), high (21-30) and very high (31-40) based on the impact level of disease on health status. A CAT score over 10 suggests significant symptoms.  A worsening CAT score could be explained by an exacerbation, poor medication adherence, poor inhaler technique, or progression of COPD or comorbid conditions.  CAT MCID is 2 points  mMRC: mMRC (Modified Medical Research Council) Dyspnea Scale is used to assess the degree of baseline functional disability in patients of respiratory disease due to dyspnea. No minimal important difference is established. A decrease in score of 1 point or greater is considered a positive change.   Pulmonary Function Assessment:   Exercise Target Goals: Exercise Program Goal: Individual exercise prescription set using results from initial 6 min walk test and THRR while considering  patients activity barriers and safety.   Exercise Prescription Goal: Initial exercise prescription builds to 30-45 minutes a day of aerobic activity, 2-3 days per week.  Home exercise guidelines will be given to patient during program as part of exercise prescription that the participant will acknowledge.  Education: Aerobic Exercise: - Group verbal and visual presentation on the components of exercise prescription. Introduces F.I.T.T principle from ACSM for exercise prescriptions.  Reviews F.I.T.T. principles of aerobic exercise including progression. Written material given at graduation.   Education: Resistance Exercise: - Group verbal and visual presentation on the components of exercise prescription. Introduces F.I.T.T principle from ACSM for exercise prescriptions  Reviews F.I.T.T. principles of resistance exercise including progression. Written material given at graduation.    Education: Exercise &  Equipment Safety: - Individual verbal instruction and demonstration of equipment use and safety with use of the equipment. Flowsheet Row Pulmonary Rehab from 03/25/2021 in [facility] Cardiac and Pulmonary Rehab  Date 03/25/21  Educator AS  Instruction Review Code 1- Verbalizes Understanding       Education: Exercise Physiology & General Exercise Guidelines: - Group verbal and written instruction with models to review the exercise physiology of the cardiovascular system and associated critical values. Provides general exercise guidelines with specific guidelines to those with heart or lung disease.    Education: Flexibility, Balance, Mind/Body Relaxation: - Group verbal and  visual presentation with interactive activity on the components of exercise prescription. Introduces F.I.T.T principle from ACSM for exercise prescriptions. Reviews F.I.T.T. principles of flexibility and balance exercise training including progression. Also discusses the mind body connection.  Reviews various relaxation techniques to help reduce and manage stress (i.e. Deep breathing, progressive muscle relaxation, and visualization). Balance handout provided to take home. Written material given at graduation.   Activity Barriers & Risk Stratification:  Activity Barriers & Cardiac Risk Stratification - 02/25/21 1535       Activity Barriers & Cardiac Risk Stratification   Activity Barriers Shortness of Breath             6 Minute Walk:  6 Minute Walk     Row Name 03/25/21 1654         6 Minute Walk   Phase Initial     Distance 865 feet     Walk Time 6 minutes     # of Rest Breaks 0     MPH 1.64     METS 1.94     RPE 15     Perceived Dyspnea  3     VO2 Peak 6.79     Symptoms Yes (comment)     Comments Leg tiredness     Resting HR 84 bpm     Resting BP 138/80     Resting Oxygen Saturation  97 %     Exercise Oxygen Saturation  during 6 min walk 95 %     Max Ex. HR 98 bpm     Max Ex. BP 228/88     2  Minute Post BP 164/88       Interval HR   1 Minute HR 93     2 Minute HR 92     3 Minute HR 98     4 Minute HR 96     5 Minute HR 96     6 Minute HR 98     2 Minute Post HR 85     Interval Heart Rate? Yes       Interval Oxygen   Interval Oxygen? Yes     Baseline Oxygen Saturation % 97 %     1 Minute Oxygen Saturation % 95 %     1 Minute Liters of Oxygen 0 L     2 Minute Oxygen Saturation % 95 %     2 Minute Liters of Oxygen 0 L     3 Minute Oxygen Saturation % 95 %     3 Minute Liters of Oxygen 0 L     4 Minute Oxygen Saturation % 95 %     4 Minute Liters of Oxygen 0 L     5 Minute Oxygen Saturation % 95 %     5 Minute Liters of Oxygen 0 L     6 Minute Oxygen Saturation % 95 %     6 Minute Liters of Oxygen 0 L     2 Minute Post Oxygen Saturation % 95 %     2 Minute Post Liters of Oxygen 0 L             Oxygen Initial Assessment:  Oxygen Initial Assessment - 02/25/21 1539       Home Oxygen   Home Oxygen Device None    Sleep Oxygen Prescription None   on CPAP waitlist   Home Exercise Oxygen Prescription None    Home Resting Oxygen Prescription None      Initial  6 min Walk   Oxygen Used None      Program Oxygen Prescription   Program Oxygen Prescription None      Intervention   Short Term Goals To learn and understand importance of monitoring SPO2 with pulse oximeter and demonstrate accurate use of the pulse oximeter.;To learn and understand importance of maintaining oxygen saturations>88%;To learn and demonstrate proper pursed lip breathing techniques or other breathing techniques. ;To learn and demonstrate proper use of respiratory medications    Long  Term Goals Verbalizes importance of monitoring SPO2 with pulse oximeter and return demonstration;Maintenance of O2 saturations>88%;Exhibits proper breathing techniques, such as pursed lip breathing or other method taught during program session;Compliance with respiratory medication;Demonstrates proper use of MDIs              Oxygen Re-Evaluation:   Oxygen Discharge (Final Oxygen Re-Evaluation):   Initial Exercise Prescription:  Initial Exercise Prescription - 03/25/21 1600       Date of Initial Exercise RX and Referring Provider   Date 03/25/21    Referring Provider Dr. Chesley Mires      Oxygen   Maintain Oxygen Saturation 88% or higher      Treadmill   MPH 1.2    Grade 0    Minutes 15    METs 1.92      T5 Nustep   Level 1    SPM 80    Minutes 15    METs 1.94      Biostep-RELP   Level 1    SPM 50    Minutes 15    METs 1.94      Track   Laps 10    Minutes 15    METs 1.54      Prescription Details   Frequency (times per week) 3    Duration Progress to 30 minutes of continuous aerobic without signs/symptoms of physical distress      Intensity   THRR 40-80% of Max Heartrate 112-140    Ratings of Perceived Exertion 11-13    Perceived Dyspnea 0-4      Progression   Progression Continue to progress workloads to maintain intensity without signs/symptoms of physical distress.      Resistance Training   Training Prescription Yes    Weight 3    Reps 10-15             Perform Capillary Blood Glucose checks as needed.  Exercise Prescription Changes:   Exercise Prescription Changes     Row Name 03/25/21 1700             Response to Exercise   Blood Pressure (Admit) 138/80       Blood Pressure (Exercise) 228/88       Blood Pressure (Exit) 144/82       Heart Rate (Admit) 84 bpm       Heart Rate (Exercise) 98 bpm       Heart Rate (Exit) 81 bpm       Oxygen Saturation (Admit) 97 %       Oxygen Saturation (Exercise) 95 %       Oxygen Saturation (Exit) 95 %       Rating of Perceived Exertion (Exercise) 15       Perceived Dyspnea (Exercise) 3       Symptoms leg tiredness       Comments 6 MWT results         Resistance Training   Training Prescription Yes  Weight 3       Reps 10-15         Treadmill   MPH 1.2       Grade 0       Minutes 15        METs 1.92         T5 Nustep   Level 1       SPM 80       Minutes 15       METs 1.94         Biostep-RELP   Level 1       SPM 50       Minutes 15       METs 1.94         Track   Laps 10       Minutes 15       METs 1.54                Exercise Comments:   Exercise Goals and Review:   Exercise Goals     Row Name 03/25/21 1703             Exercise Goals   Increase Physical Activity Yes       Intervention Provide advice, education, support and counseling about physical activity/exercise needs.;Develop an individualized exercise prescription for aerobic and resistive training based on initial evaluation findings, risk stratification, comorbidities and participant's personal goals.       Expected Outcomes Short Term: Attend rehab on a regular basis to increase amount of physical activity.;Long Term: Add in home exercise to make exercise part of routine and to increase amount of physical activity.;Long Term: Exercising regularly at least 3-5 days a week.       Increase Strength and Stamina Yes       Intervention Provide advice, education, support and counseling about physical activity/exercise needs.;Develop an individualized exercise prescription for aerobic and resistive training based on initial evaluation findings, risk stratification, comorbidities and participant's personal goals.       Expected Outcomes Short Term: Increase workloads from initial exercise prescription for resistance, speed, and METs.;Short Term: Perform resistance training exercises routinely during rehab and add in resistance training at home;Long Term: Improve cardiorespiratory fitness, muscular endurance and strength as measured by increased METs and functional capacity (6MWT)       Able to understand and use rate of perceived exertion (RPE) scale Yes       Intervention Provide education and explanation on how to use RPE scale       Expected Outcomes Short Term: Able to use RPE daily in rehab  to express subjective intensity level;Long Term:  Able to use RPE to guide intensity level when exercising independently       Able to understand and use Dyspnea scale Yes       Intervention Provide education and explanation on how to use Dyspnea scale       Expected Outcomes Short Term: Able to use Dyspnea scale daily in rehab to express subjective sense of shortness of breath during exertion;Long Term: Able to use Dyspnea scale to guide intensity level when exercising independently       Knowledge and understanding of Target Heart Rate Range (THRR) Yes       Intervention Provide education and explanation of THRR including how the numbers were predicted and where they are located for reference       Expected Outcomes Short Term: Able to state/look up THRR;Long Term: Able to use  THRR to govern intensity when exercising independently;Short Term: Able to use daily as guideline for intensity in rehab       Able to check pulse independently Yes       Intervention Provide education and demonstration on how to check pulse in carotid and radial arteries.;Review the importance of being able to check your own pulse for safety during independent exercise       Expected Outcomes Short Term: Able to explain why pulse checking is important during independent exercise;Long Term: Able to check pulse independently and accurately       Understanding of Exercise Prescription Yes       Intervention Provide education, explanation, and written materials on patient's individual exercise prescription       Expected Outcomes Short Term: Able to explain program exercise prescription;Long Term: Able to explain home exercise prescription to exercise independently                Exercise Goals Re-Evaluation :   Discharge Exercise Prescription (Final Exercise Prescription Changes):  Exercise Prescription Changes - 03/25/21 1700       Response to Exercise   Blood Pressure (Admit) 138/80    Blood Pressure (Exercise)  228/88    Blood Pressure (Exit) 144/82    Heart Rate (Admit) 84 bpm    Heart Rate (Exercise) 98 bpm    Heart Rate (Exit) 81 bpm    Oxygen Saturation (Admit) 97 %    Oxygen Saturation (Exercise) 95 %    Oxygen Saturation (Exit) 95 %    Rating of Perceived Exertion (Exercise) 15    Perceived Dyspnea (Exercise) 3    Symptoms leg tiredness    Comments 6 MWT results      Resistance Training   Training Prescription Yes    Weight 3    Reps 10-15      Treadmill   MPH 1.2    Grade 0    Minutes 15    METs 1.92      T5 Nustep   Level 1    SPM 80    Minutes 15    METs 1.94      Biostep-RELP   Level 1    SPM 50    Minutes 15    METs 1.94      Track   Laps 10    Minutes 15    METs 1.54             Nutrition:  Target Goals: Understanding of nutrition guidelines, daily intake of sodium 1500mg , cholesterol 200mg , calories 30% from fat and 7% or less from saturated fats, daily to have 5 or more servings of fruits and vegetables.  Education: All About Nutrition: -Group instruction provided by verbal, written material, interactive activities, discussions, models, and posters to present general guidelines for heart healthy nutrition including fat, fiber, MyPlate, the role of sodium in heart healthy nutrition, utilization of the nutrition label, and utilization of this knowledge for meal planning. Follow up email sent as well. Written material given at graduation.   Biometrics:  Pre Biometrics - 03/25/21 1704       Pre Biometrics   Height 5' 5.75" (1.67 m)    Weight 288 lb 1.6 oz (130.7 kg)    BMI (Calculated) 46.86    Single Leg Stand 8.97 seconds              Nutrition Therapy Plan and Nutrition Goals:  Nutrition Therapy & Goals - 03/25/21 1707  Intervention Plan   Intervention Prescribe, educate and counsel regarding individualized specific dietary modifications aiming towards targeted core components such as weight, hypertension, lipid management,  diabetes, heart failure and other comorbidities.    Expected Outcomes Short Term Goal: Understand basic principles of dietary content, such as calories, fat, sodium, cholesterol and nutrients.;Short Term Goal: A plan has been developed with personal nutrition goals set during dietitian appointment.;Long Term Goal: Adherence to prescribed nutrition plan.             Nutrition Assessments:  MEDIFICTS Score Key: ?70 Need to make dietary changes  40-70 Heart Healthy Diet ? 40 Therapeutic Level Cholesterol Diet  Flowsheet Row Pulmonary Rehab from 03/25/2021 in [facility] Cardiac and Pulmonary Rehab  Picture Your Plate Total Score on Admission 56      Picture Your Plate Scores: <40 Unhealthy dietary pattern with much room for improvement. 41-50 Dietary pattern unlikely to meet recommendations for good health and room for improvement. 51-60 More healthful dietary pattern, with some room for improvement.  >60 Healthy dietary pattern, although there may be some specific behaviors that could be improved.   Nutrition Goals Re-Evaluation:   Nutrition Goals Discharge (Final Nutrition Goals Re-Evaluation):   Psychosocial: Target Goals: Acknowledge presence or absence of significant depression and/or stress, maximize coping skills, provide positive support system. Participant is able to verbalize types and ability to use techniques and skills needed for reducing stress and depression.   Education: Stress, Anxiety, and Depression - Group verbal and visual presentation to define topics covered.  Reviews how body is impacted by stress, anxiety, and depression.  Also discusses healthy ways to reduce stress and to treat/manage anxiety and depression.  Written material given at graduation.   Education: Sleep Hygiene -Provides group verbal and written instruction about how sleep can affect your health.  Define sleep hygiene, discuss sleep cycles and impact of sleep habits. Review good sleep hygiene tips.     Initial Review & Psychosocial Screening:  Initial Psych Review & Screening - 02/25/21 1543       Initial Review   Current issues with None Identified      Family Dynamics   Good Support System? Yes   sisters     Barriers   Psychosocial barriers to participate in program There are no identifiable barriers or psychosocial needs.;The patient should benefit from training in stress management and relaxation.      Screening Interventions   Interventions Encouraged to exercise;Provide feedback about the scores to participant;To provide support and resources with identified psychosocial needs    Expected Outcomes Short Term goal: Utilizing psychosocial counselor, staff and physician to assist with identification of specific Stressors or current issues interfering with healing process. Setting desired goal for each stressor or current issue identified.;Long Term Goal: Stressors or current issues are controlled or eliminated.;Short Term goal: Identification and review with participant of any Quality of Life or Depression concerns found by scoring the questionnaire.;Long Term goal: The participant improves quality of Life and PHQ9 Scores as seen by post scores and/or verbalization of changes             Quality of Life Scores:  Scores of 19 and below usually indicate a poorer quality of life in these areas.  A difference of  2-3 points is a clinically meaningful difference.  A difference of 2-3 points in the total score of the Quality of Life Index has been associated with significant improvement in overall quality of life, self-image, physical symptoms, and general health  in studies assessing change in quality of life.  PHQ-9: Recent Review Flowsheet Data     Depression screen [facility] 2/9 03/25/2021 01/22/2021 12/27/2020 10/18/2020   Decreased Interest 1 2 0 1   Down, Depressed, Hopeless 0 1 0 0   PHQ - 2 Score 1 3 0 1   Altered sleeping 0 0 - 1   Tired, decreased energy 3 3 - 1   Change in  appetite 1 1 - 3   Feeling bad or failure about yourself  0 0 - 0   Trouble concentrating 0 0 - 0   Moving slowly or fidgety/restless 0 1 - 0   Suicidal thoughts 0 0 - 0   PHQ-9 Score 5 8 - 6   Difficult doing work/chores Not difficult at all Not difficult at all - Not difficult at all      Interpretation of Total Score  Total Score Depression Severity:  1-4 = Minimal depression, 5-9 = Mild depression, 10-14 = Moderate depression, 15-19 = Moderately severe depression, 20-27 = Severe depression   Psychosocial Evaluation and Intervention:  Psychosocial Evaluation - 02/25/21 1552       Psychosocial Evaluation & Interventions   Interventions Encouraged to exercise with the program and follow exercise prescription    Comments Paige Hester is coming to pulmonary rehab with increased dyspnea. She has noticed her breathing getting worse recently and thinks besides her current health conditions, her weight has also contributed to this. She recently had a sleep study and is on the 3 month waiting list to get her CPAP equipment. She is very excited to get involved in pulmonary rehab and hopeful her sister will be her accountability partner. She reports no current stress concerns, that she works to maintain a low stress level because she said her brother's cause of death was related to stress. She has a great support system of family, with her younger sister living next door. She wants to work on her stamina and get back to feeling more like herself.    Expected Outcomes Short: attend pulmonary rehab for education and exercise. Long:develop and maintain positive self care habits.    Continue Psychosocial Services  Follow up required by staff             Psychosocial Re-Evaluation:   Psychosocial Discharge (Final Psychosocial Re-Evaluation):   Education: Education Goals: Education classes will be provided on a weekly basis, covering required topics. Participant will state understanding/return  demonstration of topics presented.  Learning Barriers/Preferences:  Learning Barriers/Preferences - 02/25/21 1540       Learning Barriers/Preferences   Learning Barriers None    Learning Preferences None             General Pulmonary Education Topics:  Infection Prevention: - Provides verbal and written material to individual with discussion of infection control including proper hand washing and proper equipment cleaning during exercise session. Flowsheet Row Pulmonary Rehab from 03/25/2021 in [facility] Cardiac and Pulmonary Rehab  Date 03/25/21  Educator AS  Instruction Review Code 1- Verbalizes Understanding       Falls Prevention: - Provides verbal and written material to individual with discussion of falls prevention and safety. Flowsheet Row Pulmonary Rehab from 03/25/2021 in [facility] Cardiac and Pulmonary Rehab  Date 03/25/21  Educator AS  Instruction Review Code 1- Verbalizes Understanding       Chronic Lung Disease Review: - Group verbal instruction with posters, models, PowerPoint presentations and videos,  to review new updates, new respiratory medications, new  advancements in procedures and treatments. Providing information on websites and "800" numbers for continued self-education. Includes information about supplement oxygen, available portable oxygen systems, continuous and intermittent flow rates, oxygen safety, concentrators, and Medicare reimbursement for oxygen. Explanation of Pulmonary Drugs, including class, frequency, complications, importance of spacers, rinsing mouth after steroid MDI's, and proper cleaning methods for nebulizers. Review of basic lung anatomy and physiology related to function, structure, and complications of lung disease. Review of risk factors. Discussion about methods for diagnosing sleep apnea and types of masks and machines for OSA. Includes a review of the use of types of environmental controls: home humidity, furnaces, filters, dust mite/pet  prevention, HEPA vacuums. Discussion about weather changes, air quality and the benefits of nasal washing. Instruction on Warning signs, infection symptoms, calling MD promptly, preventive modes, and value of vaccinations. Review of effective airway clearance, coughing and/or vibration techniques. Emphasizing that all should Create an Action Plan. Written material given at graduation. Flowsheet Row Pulmonary Rehab from 03/25/2021 in [facility] Cardiac and Pulmonary Rehab  Education need identified 03/25/21       AED/CPR: - Group verbal and written instruction with the use of models to demonstrate the basic use of the AED with the basic ABC's of resuscitation.    Anatomy and Cardiac Procedures: - Group verbal and visual presentation and models provide information about basic cardiac anatomy and function. Reviews the testing methods done to diagnose heart disease and the outcomes of the test results. Describes the treatment choices: Medical Management, Angioplasty, or Coronary Bypass Surgery for treating various heart conditions including Myocardial Infarction, Angina, Valve Disease, and Cardiac Arrhythmias.  Written material given at graduation.   Medication Safety: - Group verbal and visual instruction to review commonly prescribed medications for heart and lung disease. Reviews the medication, class of the drug, and side effects. Includes the steps to properly store meds and maintain the prescription regimen.  Written material given at graduation.   Other: -Provides group and verbal instruction on various topics (see comments)   Knowledge Questionnaire Score:  Knowledge Questionnaire Score - 03/25/21 1709       Knowledge Questionnaire Score   Pre Score 15/18              Core Components/Risk Factors/Patient Goals at Admission:  Personal Goals and Risk Factors at Admission - 03/25/21 1705       Core Components/Risk Factors/Patient Goals on Admission    Weight Management  Yes;Obesity;Weight Loss    Intervention Weight Management: Develop a combined nutrition and exercise program designed to reach desired caloric intake, while maintaining appropriate intake of nutrient and fiber, sodium and fats, and appropriate energy expenditure required for the weight goal.;Weight Management: Provide education and appropriate resources to help participant work on and attain dietary goals.;Weight Management/Obesity: Establish reasonable short term and long term weight goals.;Obesity: Provide education and appropriate resources to help participant work on and attain dietary goals.    Admit Weight 288 lb 1.6 oz (130.7 kg)    Goal Weight: Short Term 275 lb (124.7 kg)    Goal Weight: Long Term 260 lb (117.9 kg)    Expected Outcomes Short Term: Continue to assess and modify interventions until short term weight is achieved;Long Term: Adherence to nutrition and physical activity/exercise program aimed toward attainment of established weight goal;Weight Loss: Understanding of general recommendations for a balanced deficit meal plan, which promotes 1-2 lb weight loss per week and includes a negative energy balance of [phone] kcal/d;Understanding recommendations for meals to include 15-35% energy  as protein, 25-35% energy from fat, 35-60% energy from carbohydrates, less than 200mg  of dietary cholesterol, 20-35 gm of total fiber daily;Understanding of distribution of calorie intake throughout the day with the consumption of 4-5 meals/snacks    Improve shortness of breath with ADL's Yes    Intervention Provide education, individualized exercise plan and daily activity instruction to help decrease symptoms of SOB with activities of daily living.    Expected Outcomes Short Term: Improve cardiorespiratory fitness to achieve a reduction of symptoms when performing ADLs;Long Term: Be able to perform more ADLs without symptoms or delay the onset of symptoms    Increase knowledge of respiratory medications  and ability to use respiratory devices properly  Yes    Intervention Provide education and demonstration as needed of appropriate use of medications, inhalers, and oxygen therapy.    Expected Outcomes Short Term: Achieves understanding of medications use. Understands that oxygen is a medication prescribed by physician. Demonstrates appropriate use of inhaler and oxygen therapy.;Long Term: Maintain appropriate use of medications, inhalers, and oxygen therapy.    Hypertension Yes    Intervention Provide education on lifestyle modifcations including regular physical activity/exercise, weight management, moderate sodium restriction and increased consumption of fresh fruit, vegetables, and low fat dairy, alcohol moderation, and smoking cessation.;Monitor prescription use compliance.    Expected Outcomes Short Term: Continued assessment and intervention until BP is < 140/72mm HG in hypertensive participants. < 130/1mm HG in hypertensive participants with diabetes, heart failure or chronic kidney disease.;Long Term: Maintenance of blood pressure at goal levels.    Lipids Yes    Intervention Provide education and support for participant on nutrition & aerobic/resistive exercise along with prescribed medications to achieve LDL 70mg , HDL >40mg .    Expected Outcomes Short Term: Participant states understanding of desired cholesterol values and is compliant with medications prescribed. Participant is following exercise prescription and nutrition guidelines.;Long Term: Cholesterol controlled with medications as prescribed, with individualized exercise RX and with personalized nutrition plan. Value goals: LDL < 70mg , HDL > 40 mg.             Education:Diabetes - Individual verbal and written instruction to review signs/symptoms of diabetes, desired ranges of glucose level fasting, after meals and with exercise. Acknowledge that pre and post exercise glucose checks will be done for 3 sessions at entry of  program.   Know Your Numbers and Heart Failure: - Group verbal and visual instruction to discuss disease risk factors for cardiac and pulmonary disease and treatment options.  Reviews associated critical values for Overweight/Obesity, Hypertension, Cholesterol, and Diabetes.  Discusses basics of heart failure: signs/symptoms and treatments.  Introduces Heart Failure Zone chart for action plan for heart failure.  Written material given at graduation.   Core Components/Risk Factors/Patient Goals Review:    Core Components/Risk Factors/Patient Goals at Discharge (Final Review):    ITP Comments:  ITP Comments     Row Name 02/25/21 1546 03/25/21 1654         ITP Comments Initial telephone orientation completed. Diagnosis can be found in [facility] 11/29. EP Orientation scheduled for Monday 1/9 at 3pm. Completed 6MWT and gym orientation. Initial ITP created and sent for review to Dr. Zetta Bills, Medical Director.               Comments: initial ITP

## 2021-03-25 NOTE — Patient Instructions (Addendum)
Medication Instructions:  Eliquis 2.5 mg twice a day  as needed for DVT prevention during travel [phone]) (register $10 CO-Pay card) Phone: [phone] (patient assistance)  If you need a refill on your cardiac medications before your next appointment, please call your pharmacy.   Lab work: No new labs needed  Testing/Procedures: No new testing needed  Follow-Up: At [facility], you and your health needs are our priority.  As part of our continuing mission to provide you with exceptional heart care, we have created designated Provider Care Teams.  These Care Teams include your primary Cardiologist (physician) and Advanced Practice Providers (APPs -  Physician Assistants and Nurse Practitioners) who all work together to provide you with the care you need, when you need it.  You will need a follow up appointment in 12 months  Providers on your designated Care Team:   Murray Hodgkins, NP Christell Faith, PA-C Cadence Kathlen Mody, [place]  COVID-19 Vaccine Information can be found at: [email or web address].uk For questions related to vaccine distribution or appointments, please email [email or web address] or call [phone].

## 2021-03-27 ENCOUNTER — Other Ambulatory Visit: Payer: Self-pay

## 2021-03-27 DIAGNOSIS — R0600 Dyspnea, unspecified: Secondary | ICD-10-CM

## 2021-03-27 NOTE — Progress Notes (Signed)
Daily Session Note  Patient Details  Name: Paige Hester MRN: [id number] Date of Birth: 08-25-1955 Referring Provider:   Flowsheet Row Pulmonary Rehab from 03/25/2021 in [facility] Cardiac and Pulmonary Rehab  Referring Provider Dr. Chesley Mires       Encounter Date: 03/27/2021  Check In:  Session Check In - 03/27/21 1330       Check-In   Supervising physician immediately available to respond to emergencies See telemetry face sheet for immediately available ER MD    Location [facility]-Cardiac & Pulmonary Rehab    Staff Present Birdie Sons, MPA, RN;Joseph Lou Miner, MS, ASCM CEP, Exercise Physiologist    Virtual Visit No    Medication changes reported     No    Fall or balance concerns reported    No    Warm-up and Cool-down Performed on first and last piece of equipment    Resistance Training Performed Yes    VAD Patient? No    PAD/SET Patient? No      Pain Assessment   Currently in Pain? No/denies                Social History   Tobacco Use  Smoking Status Former   Packs/day: 1.00   Years: 27.00   Pack years: 27.00   Types: Cigarettes   Quit date: 1999   Years since quitting: 24.0  Smokeless Tobacco Never    Goals Met:  Independence with exercise equipment Exercise tolerated well No report of concerns or symptoms today Strength training completed today  Goals Unmet:  Not Applicable  Comments: First full day of exercise!  Patient was oriented to gym and equipment including functions, settings, policies, and procedures.  Patient's individual exercise prescription and treatment plan were reviewed.  All starting workloads were established based on the results of the 6 minute walk test done at initial orientation visit.  The plan for exercise progression was also introduced and progression will be customized based on patient's performance and goals.    Dr. Emily Filbert is Medical Director for [place].  Dr. Ottie Glazier is Medical Director for [facility] Pulmonary Rehabilitation.

## 2021-03-28 ENCOUNTER — Other Ambulatory Visit: Payer: Self-pay

## 2021-03-28 ENCOUNTER — Encounter: Payer: Federal, State, Local not specified - PPO | Admitting: *Deleted

## 2021-03-28 DIAGNOSIS — R0600 Dyspnea, unspecified: Secondary | ICD-10-CM

## 2021-03-28 NOTE — Progress Notes (Signed)
Daily Session Note  Patient Details  Name: Paige Hester MRN: [id number] Date of Birth: 05-Oct-1955 Referring Provider:   Flowsheet Row Pulmonary Rehab from 03/25/2021 in [facility] Cardiac and Pulmonary Rehab  Referring Provider Dr. Chesley Mires       Encounter Date: 03/28/2021  Check In:  Session Check In - 03/28/21 1331       Check-In   Supervising physician immediately available to respond to emergencies See telemetry face sheet for immediately available ER MD    Location [facility]-Cardiac & Pulmonary Rehab    Staff Present Renita Papa, RN BSN;Joseph Bulpitt, RCP,RRT,BSRT;Jessica [place], [place], RCEP, CCRP, CCET    Virtual Visit No    Medication changes reported     No    Fall or balance concerns reported    No    Warm-up and Cool-down Performed on first and last piece of equipment    Resistance Training Performed Yes    VAD Patient? No    PAD/SET Patient? No      Pain Assessment   Currently in Pain? No/denies                Social History   Tobacco Use  Smoking Status Former   Packs/day: 1.00   Years: 27.00   Pack years: 27.00   Types: Cigarettes   Quit date: 1999   Years since quitting: 24.0  Smokeless Tobacco Never    Goals Met:  Independence with exercise equipment Exercise tolerated well No report of concerns or symptoms today Strength training completed today  Goals Unmet:  Not Applicable  Comments: Pt able to follow exercise prescription today without complaint.  Will continue to monitor for progression.    Dr. Emily Filbert is Medical Director for [facility].  Dr. Ottie Glazier is Medical Director for [facility] Pulmonary Rehabilitation.

## 2021-04-01 ENCOUNTER — Encounter: Payer: Federal, State, Local not specified - PPO | Admitting: *Deleted

## 2021-04-01 ENCOUNTER — Other Ambulatory Visit: Payer: Self-pay

## 2021-04-01 DIAGNOSIS — R0600 Dyspnea, unspecified: Secondary | ICD-10-CM

## 2021-04-01 NOTE — Progress Notes (Signed)
Daily Session Note  Patient Details  Name: Paige ALBERSON MRN: [id number] Date of Birth: 07-24-1955 Referring Provider:   Flowsheet Row Pulmonary Rehab from 03/25/2021 in [facility] Cardiac and Pulmonary Rehab  Referring Provider Dr. Chesley Mires       Encounter Date: 04/01/2021  Check In:  Session Check In - 04/01/21 1351       Check-In   Supervising physician immediately available to respond to emergencies See telemetry face sheet for immediately available ER MD    Location [facility]-Cardiac & Pulmonary Rehab    Staff Present Nyoka Cowden, RN, BSN, Tyna Jaksch, MS, ASCM CEP, Exercise Physiologist;Joseph Tessie Fass, [place]    Virtual Visit No    Medication changes reported     No    Fall or balance concerns reported    No    Tobacco Cessation No Change    Warm-up and Cool-down Performed on first and last piece of equipment    Resistance Training Performed Yes    PAD/SET Patient? No      Pain Assessment   Currently in Pain? No/denies                Social History   Tobacco Use  Smoking Status Former   Packs/day: 1.00   Years: 27.00   Pack years: 27.00   Types: Cigarettes   Quit date: 1999   Years since quitting: 24.0  Smokeless Tobacco Never    Goals Met:  Independence with exercise equipment Exercise tolerated well No report of concerns or symptoms today  Goals Unmet:  Not Applicable  Comments: Pt able to follow exercise prescription today without complaint.  Will continue to monitor for progression.    Dr. Emily Filbert is Medical Director for [place].  Dr. Ottie Glazier is Medical Director for [facility] Pulmonary Rehabilitation.

## 2021-04-04 ENCOUNTER — Encounter: Payer: Federal, State, Local not specified - PPO | Admitting: *Deleted

## 2021-04-04 ENCOUNTER — Other Ambulatory Visit: Payer: Self-pay

## 2021-04-04 ENCOUNTER — Ambulatory Visit (INDEPENDENT_AMBULATORY_CARE_PROVIDER_SITE_OTHER): Payer: Medicare Other | Admitting: Family Medicine

## 2021-04-04 ENCOUNTER — Encounter (INDEPENDENT_AMBULATORY_CARE_PROVIDER_SITE_OTHER): Payer: Self-pay | Admitting: Family Medicine

## 2021-04-04 VITALS — BP 143/73 | Temp 98.0°F | Ht 64.0 in | Wt 280.0 lb

## 2021-04-04 DIAGNOSIS — I10 Essential (primary) hypertension: Secondary | ICD-10-CM | POA: Diagnosis not present

## 2021-04-04 DIAGNOSIS — R0600 Dyspnea, unspecified: Secondary | ICD-10-CM | POA: Diagnosis not present

## 2021-04-04 DIAGNOSIS — R7301 Impaired fasting glucose: Secondary | ICD-10-CM

## 2021-04-04 DIAGNOSIS — Z6841 Body Mass Index (BMI) 40.0 and over, adult: Secondary | ICD-10-CM

## 2021-04-04 DIAGNOSIS — R0609 Other forms of dyspnea: Secondary | ICD-10-CM

## 2021-04-04 DIAGNOSIS — E7800 Pure hypercholesterolemia, unspecified: Secondary | ICD-10-CM | POA: Diagnosis not present

## 2021-04-04 DIAGNOSIS — E669 Obesity, unspecified: Secondary | ICD-10-CM

## 2021-04-04 NOTE — Progress Notes (Signed)
Daily Session Note  Patient Details  Name: Paige Hester MRN: [id number] Date of Birth: 04-14-1955 Referring Provider:   Flowsheet Row Pulmonary Rehab from 03/25/2021 in [facility] Cardiac and Pulmonary Rehab  Referring Provider Dr. Chesley Mires       Encounter Date: 04/04/2021  Check In:  Session Check In - 04/04/21 1336       Check-In   Supervising physician immediately available to respond to emergencies See telemetry face sheet for immediately available ER MD    Staff Present Renita Papa, RN BSN;Joseph [place], RCP,RRT,BSRT;Jessica [place], [place], RCEP, CCRP, CCET    Virtual Visit No    Medication changes reported     No    Fall or balance concerns reported    No    Warm-up and Cool-down Performed on first and last piece of equipment    Resistance Training Performed Yes    VAD Patient? No    PAD/SET Patient? No      Pain Assessment   Currently in Pain? No/denies                Social History   Tobacco Use  Smoking Status Former   Packs/day: 1.00   Years: 27.00   Pack years: 27.00   Types: Cigarettes   Quit date: 1999   Years since quitting: 24.0  Smokeless Tobacco Never    Goals Met:  Independence with exercise equipment Exercise tolerated well No report of concerns or symptoms today Strength training completed today  Goals Unmet:  Not Applicable  Comments: Pt able to follow exercise prescription today without complaint.  Will continue to monitor for progression.    Dr. Emily Filbert is Medical Director for [facility].  Dr. Ottie Glazier is Medical Director for [facility] Pulmonary Rehabilitation.

## 2021-04-08 ENCOUNTER — Other Ambulatory Visit: Payer: Self-pay

## 2021-04-08 DIAGNOSIS — R0600 Dyspnea, unspecified: Secondary | ICD-10-CM

## 2021-04-08 NOTE — Progress Notes (Signed)
Daily Session Note  Patient Details  Name: Paige Hester MRN: [id number] Date of Birth: December 27, 1955 Referring Provider:   Flowsheet Row Pulmonary Rehab from 03/25/2021 in [facility] Cardiac and Pulmonary Rehab  Referring Provider Dr. Chesley Mires       Encounter Date: 04/08/2021  Check In:  Session Check In - 04/08/21 1336       Check-In   Supervising physician immediately available to respond to emergencies See telemetry face sheet for immediately available ER MD    Location [facility]-Cardiac & Pulmonary Rehab    Staff Present Birdie Sons, MPA, RN;Joseph Garnette Czech [place], MS, ASCM CEP, Exercise Physiologist    Virtual Visit No    Medication changes reported     No    Fall or balance concerns reported    No    Tobacco Cessation No Change    Warm-up and Cool-down Performed on first and last piece of equipment    Resistance Training Performed Yes    VAD Patient? No    PAD/SET Patient? No      Pain Assessment   Currently in Pain? No/denies                Social History   Tobacco Use  Smoking Status Former   Packs/day: 1.00   Years: 27.00   Pack years: 27.00   Types: Cigarettes   Quit date: 1999   Years since quitting: 24.0  Smokeless Tobacco Never    Goals Met:  Independence with exercise equipment Exercise tolerated well No report of concerns or symptoms today Strength training completed today  Goals Unmet:  Not Applicable  Comments: Pt able to follow exercise prescription today without complaint.  Will continue to monitor for progression.    Dr. Emily Filbert is Medical Director for [place].  Dr. Ottie Glazier is Medical Director for [facility] Pulmonary Rehabilitation.

## 2021-04-10 ENCOUNTER — Encounter: Payer: Self-pay | Admitting: *Deleted

## 2021-04-10 DIAGNOSIS — R0600 Dyspnea, unspecified: Secondary | ICD-10-CM

## 2021-04-10 NOTE — Progress Notes (Signed)
Pulmonary Individual Treatment Plan  Patient Details  Name: Paige Hester MRN: [id number] Date of Birth: 28-Aug-1955 Referring Provider:   Flowsheet Row Pulmonary Rehab from 03/25/2021 in [facility] Cardiac and Pulmonary Rehab  Referring Provider Dr. Chesley Mires       Initial Encounter Date:  Flowsheet Row Pulmonary Rehab from 03/25/2021 in [facility] Cardiac and Pulmonary Rehab  Date 03/25/21       Visit Diagnosis: Dyspnea, unspecified type  Patient's Home Medications on Admission:  Current Outpatient Medications:    albuterol (VENTOLIN HFA) 108 (90 Base) MCG/ACT inhaler, Inhale 2 puffs into the lungs every 6 (six) hours as needed for wheezing or shortness of breath., Disp: 8 g, Rfl: 2   amLODipine (NORVASC) 10 MG tablet, Take 1 tablet (10 mg total) by mouth daily. For blood pressure., Disp: 90 tablet, Rfl: 0   apixaban (ELIQUIS) 2.5 MG TABS tablet, Take 1 tablet (2.5 mg total) by mouth 2 (two) times daily as needed. For DVT prevention during travel, Disp: 60 tablet, Rfl: 3   Cholecalciferol (VITAMIN D3) 1000 UNITS tablet, Take 1,000 Units by mouth daily., Disp: , Rfl:    docusate sodium (COLACE) 100 MG capsule, Take 1 capsule (100 mg total) by mouth 2 (two) times daily as needed for mild constipation or moderate constipation., Disp: 30 capsule, Rfl: 2   fluticasone (FLONASE) 50 MCG/ACT nasal spray, Place 1 spray into both nostrils 2 (two) times daily as needed for allergies or rhinitis., Disp: 16 g, Rfl: 0   loratadine (CLARITIN) 10 MG tablet, Take 10 mg by mouth daily., Disp: , Rfl:    losartan-hydrochlorothiazide (HYZAAR) 100-25 MG tablet, Take 1 tablet by mouth daily. For blood pressure., Disp: 90 tablet, Rfl: 0   Melatonin 10 MG TABS, Take by mouth as needed., Disp: , Rfl:    omeprazole (PRILOSEC) 20 MG capsule, Take 20 mg by mouth every other day., Disp: , Rfl:    Potassium 99 MG TABS, Take 1 tablet by mouth in the morning and at bedtime., Disp: , Rfl:    rosuvastatin (CRESTOR) 20 MG  tablet, Take 1 tablet (20 mg total) by mouth daily., Disp: 90 tablet, Rfl: 3   Semaglutide,0.25 or 0.5MG /DOS, (OZEMPIC, 0.25 OR 0.5 MG/DOSE,) 2 MG/1.5ML SOPN, Inject 0.25 mg into the skin once a week., Disp: 1.5 mL, Rfl: 0  Past Medical History: Past Medical History:  Diagnosis Date   Allergy    Bilateral swelling of feet and ankles    Gallbladder polyp 2012   GERD (gastroesophageal reflux disease)    Hyperlipidemia    Hypertension    Iron deficiency anemia    Menopausal symptoms    since 2007   Murmur, cardiac    Other fatigue    Prediabetes    Pulmonary embolism ([place]) 06/2020   Shortness of breath    Shortness of breath on exertion    Vitamin D deficiency     Tobacco Use: Social History   Tobacco Use  Smoking Status Former   Packs/day: 1.00   Years: 27.00   Pack years: 27.00   Types: Cigarettes   Quit date: 1999   Years since quitting: 24.0  Smokeless Tobacco Never    Labs: Recent Review Scientist, physiological     Labs for ITP Cardiac and Pulmonary Rehab Latest Ref Rng & Units 03/03/2019 11/18/2019 10/18/2020 01/22/2021 02/12/2021   Cholestrol 0 - 200 mg/dL - 158 108 130 114   LDLCALC 0 - 99 mg/dL - 105(H) 54 58 47   HDL >39.00  mg/dL - 42.10 45.60 61 57.00   Trlycerides 0.0 - 149.0 mg/dL - 52.0 43.0 44 46.0   Hemoglobin A1c 4.8 - 5.6 % 5.9(A) 6.0 6.1 5.6 -        Pulmonary Assessment Scores:  Pulmonary Assessment Scores     Row Name 03/25/21 1710         ADL UCSD   SOB Score total 72     Rest 0     Walk 4     Stairs 5     Bath 2     Dress 3     Shop 4       CAT Score   CAT Score 19       mMRC Score   mMRC Score 3              UCSD: Self-administered rating of dyspnea associated with activities of daily living (ADLs) 6-point scale (0 = "not at all" to 5 = "maximal or unable to do because of breathlessness")  Scoring Scores range from 0 to 120.  Minimally important difference is 5 units  CAT: CAT can identify the health impairment of COPD  patients and is better correlated with disease progression.  CAT has a scoring range of zero to 40. The CAT score is classified into four groups of low (less than 10), medium (10 - 20), high (21-30) and very high (31-40) based on the impact level of disease on health status. A CAT score over 10 suggests significant symptoms.  A worsening CAT score could be explained by an exacerbation, poor medication adherence, poor inhaler technique, or progression of COPD or comorbid conditions.  CAT MCID is 2 points  mMRC: mMRC (Modified Medical Research Council) Dyspnea Scale is used to assess the degree of baseline functional disability in patients of respiratory disease due to dyspnea. No minimal important difference is established. A decrease in score of 1 point or greater is considered a positive change.   Pulmonary Function Assessment:   Exercise Target Goals: Exercise Program Goal: Individual exercise prescription set using results from initial 6 min walk test and THRR while considering  patients activity barriers and safety.   Exercise Prescription Goal: Initial exercise prescription builds to 30-45 minutes a day of aerobic activity, 2-3 days per week.  Home exercise guidelines will be given to patient during program as part of exercise prescription that the participant will acknowledge.  Education: Aerobic Exercise: - Group verbal and visual presentation on the components of exercise prescription. Introduces F.I.T.T principle from ACSM for exercise prescriptions.  Reviews F.I.T.T. principles of aerobic exercise including progression. Written material given at graduation.   Education: Resistance Exercise: - Group verbal and visual presentation on the components of exercise prescription. Introduces F.I.T.T principle from ACSM for exercise prescriptions  Reviews F.I.T.T. principles of resistance exercise including progression. Written material given at graduation.    Education: Exercise &  Equipment Safety: - Individual verbal instruction and demonstration of equipment use and safety with use of the equipment. Flowsheet Row Pulmonary Rehab from 03/25/2021 in [facility] Cardiac and Pulmonary Rehab  Date 03/25/21  Educator AS  Instruction Review Code 1- Verbalizes Understanding       Education: Exercise Physiology & General Exercise Guidelines: - Group verbal and written instruction with models to review the exercise physiology of the cardiovascular system and associated critical values. Provides general exercise guidelines with specific guidelines to those with heart or lung disease.    Education: Flexibility, Balance, Mind/Body Relaxation: - Group verbal and  visual presentation with interactive activity on the components of exercise prescription. Introduces F.I.T.T principle from ACSM for exercise prescriptions. Reviews F.I.T.T. principles of flexibility and balance exercise training including progression. Also discusses the mind body connection.  Reviews various relaxation techniques to help reduce and manage stress (i.e. Deep breathing, progressive muscle relaxation, and visualization). Balance handout provided to take home. Written material given at graduation.   Activity Barriers & Risk Stratification:  Activity Barriers & Cardiac Risk Stratification - 02/25/21 1535       Activity Barriers & Cardiac Risk Stratification   Activity Barriers Shortness of Breath             6 Minute Walk:  6 Minute Walk     Row Name 03/25/21 1654         6 Minute Walk   Phase Initial     Distance 865 feet     Walk Time 6 minutes     # of Rest Breaks 0     MPH 1.64     METS 1.94     RPE 15     Perceived Dyspnea  3     VO2 Peak 6.79     Symptoms Yes (comment)     Comments Leg tiredness     Resting HR 84 bpm     Resting BP 138/80     Resting Oxygen Saturation  97 %     Exercise Oxygen Saturation  during 6 min walk 95 %     Max Ex. HR 98 bpm     Max Ex. BP 228/88     2  Minute Post BP 164/88       Interval HR   1 Minute HR 93     2 Minute HR 92     3 Minute HR 98     4 Minute HR 96     5 Minute HR 96     6 Minute HR 98     2 Minute Post HR 85     Interval Heart Rate? Yes       Interval Oxygen   Interval Oxygen? Yes     Baseline Oxygen Saturation % 97 %     1 Minute Oxygen Saturation % 95 %     1 Minute Liters of Oxygen 0 L     2 Minute Oxygen Saturation % 95 %     2 Minute Liters of Oxygen 0 L     3 Minute Oxygen Saturation % 95 %     3 Minute Liters of Oxygen 0 L     4 Minute Oxygen Saturation % 95 %     4 Minute Liters of Oxygen 0 L     5 Minute Oxygen Saturation % 95 %     5 Minute Liters of Oxygen 0 L     6 Minute Oxygen Saturation % 95 %     6 Minute Liters of Oxygen 0 L     2 Minute Post Oxygen Saturation % 95 %     2 Minute Post Liters of Oxygen 0 L             Oxygen Initial Assessment:  Oxygen Initial Assessment - 02/25/21 1539       Home Oxygen   Home Oxygen Device None    Sleep Oxygen Prescription None   on CPAP waitlist   Home Exercise Oxygen Prescription None    Home Resting Oxygen Prescription None      Initial  6 min Walk   Oxygen Used None      Program Oxygen Prescription   Program Oxygen Prescription None      Intervention   Short Term Goals To learn and understand importance of monitoring SPO2 with pulse oximeter and demonstrate accurate use of the pulse oximeter.;To learn and understand importance of maintaining oxygen saturations>88%;To learn and demonstrate proper pursed lip breathing techniques or other breathing techniques. ;To learn and demonstrate proper use of respiratory medications    Long  Term Goals Verbalizes importance of monitoring SPO2 with pulse oximeter and return demonstration;Maintenance of O2 saturations>88%;Exhibits proper breathing techniques, such as pursed lip breathing or other method taught during program session;Compliance with respiratory medication;Demonstrates proper use of MDIs              Oxygen Re-Evaluation:  Oxygen Re-Evaluation     Row Name 03/27/21 1331             Program Oxygen Prescription   Program Oxygen Prescription None         Home Oxygen   Home Oxygen Device None       Sleep Oxygen Prescription None       Home Exercise Oxygen Prescription None       Home Resting Oxygen Prescription None         Goals/Expected Outcomes   Short Term Goals To learn and understand importance of monitoring SPO2 with pulse oximeter and demonstrate accurate use of the pulse oximeter.;To learn and understand importance of maintaining oxygen saturations>88%;To learn and demonstrate proper pursed lip breathing techniques or other breathing techniques.        Long  Term Goals Verbalizes importance of monitoring SPO2 with pulse oximeter and return demonstration;Maintenance of O2 saturations>88%;Exhibits proper breathing techniques, such as pursed lip breathing or other method taught during program session;Compliance with respiratory medication       Comments Reviewed PLB technique with pt.  Talked about how it works and it's importance in maintaining their exercise saturations.       Goals/Expected Outcomes Short: Become more profiecient at using PLB.   Long: Become independent at using PLB.                Oxygen Discharge (Final Oxygen Re-Evaluation):  Oxygen Re-Evaluation - 03/27/21 1331       Program Oxygen Prescription   Program Oxygen Prescription None      Home Oxygen   Home Oxygen Device None    Sleep Oxygen Prescription None    Home Exercise Oxygen Prescription None    Home Resting Oxygen Prescription None      Goals/Expected Outcomes   Short Term Goals To learn and understand importance of monitoring SPO2 with pulse oximeter and demonstrate accurate use of the pulse oximeter.;To learn and understand importance of maintaining oxygen saturations>88%;To learn and demonstrate proper pursed lip breathing techniques or other breathing techniques.      Long  Term Goals Verbalizes importance of monitoring SPO2 with pulse oximeter and return demonstration;Maintenance of O2 saturations>88%;Exhibits proper breathing techniques, such as pursed lip breathing or other method taught during program session;Compliance with respiratory medication    Comments Reviewed PLB technique with pt.  Talked about how it works and it's importance in maintaining their exercise saturations.    Goals/Expected Outcomes Short: Become more profiecient at using PLB.   Long: Become independent at using PLB.             Initial Exercise Prescription:  Initial Exercise Prescription - 03/25/21 1600  Date of Initial Exercise RX and Referring Provider   Date 03/25/21    Referring Provider Dr. Chesley Mires      Oxygen   Maintain Oxygen Saturation 88% or higher      Treadmill   MPH 1.2    Grade 0    Minutes 15    METs 1.92      T5 Nustep   Level 1    SPM 80    Minutes 15    METs 1.94      Biostep-RELP   Level 1    SPM 50    Minutes 15    METs 1.94      Track   Laps 10    Minutes 15    METs 1.54      Prescription Details   Frequency (times per week) 3    Duration Progress to 30 minutes of continuous aerobic without signs/symptoms of physical distress      Intensity   THRR 40-80% of Max Heartrate 112-140    Ratings of Perceived Exertion 11-13    Perceived Dyspnea 0-4      Progression   Progression Continue to progress workloads to maintain intensity without signs/symptoms of physical distress.      Resistance Training   Training Prescription Yes    Weight 3    Reps 10-15             Perform Capillary Blood Glucose checks as needed.  Exercise Prescription Changes:   Exercise Prescription Changes     Row Name 03/25/21 1700 04/08/21 1300           Response to Exercise   Blood Pressure (Admit) 138/80 126/74      Blood Pressure (Exercise) 228/88 168/72      Blood Pressure (Exit) 144/82 114/66      Heart Rate (Admit) 84  bpm 90 bpm      Heart Rate (Exercise) 98 bpm 106 bpm      Heart Rate (Exit) 81 bpm 97 bpm      Oxygen Saturation (Admit) 97 % 98 %      Oxygen Saturation (Exercise) 95 % 95 %      Oxygen Saturation (Exit) 95 % 97 %      Rating of Perceived Exertion (Exercise) 15 15      Perceived Dyspnea (Exercise) 3 2      Symptoms leg tiredness SOB      Comments 6 MWT results --      Duration -- Continue with 30 min of aerobic exercise without signs/symptoms of physical distress.      Intensity -- THRR unchanged        Progression   Progression -- Continue to progress workloads to maintain intensity without signs/symptoms of physical distress.      Average METs -- 2.12        Resistance Training   Training Prescription Yes Yes      Weight 3 4 lb      Reps 10-15 10-15        Interval Training   Interval Training -- No        Treadmill   MPH 1.2 --      Grade 0 --      Minutes 15 --      METs 1.92 --        T5 Nustep   Level 1 3      SPM 80 --      Minutes 15 15  METs 1.94 1.9        Biostep-RELP   Level 1 2      SPM 50 --      Minutes 15 15      METs 1.94 2        Track   Laps 10 23      Minutes 15 15      METs 1.54 2.25        Oxygen   Maintain Oxygen Saturation -- 88% or higher               Exercise Comments:   Exercise Comments     Row Name 03/27/21 1330           Exercise Comments First full day of exercise!  Patient was oriented to gym and equipment including functions, settings, policies, and procedures.  Patient's individual exercise prescription and treatment plan were reviewed.  All starting workloads were established based on the results of the 6 minute walk test done at initial orientation visit.  The plan for exercise progression was also introduced and progression will be customized based on patient's performance and goals.                Exercise Goals and Review:   Exercise Goals     Row Name 03/25/21 1703             Exercise  Goals   Increase Physical Activity Yes       Intervention Provide advice, education, support and counseling about physical activity/exercise needs.;Develop an individualized exercise prescription for aerobic and resistive training based on initial evaluation findings, risk stratification, comorbidities and participant's personal goals.       Expected Outcomes Short Term: Attend rehab on a regular basis to increase amount of physical activity.;Long Term: Add in home exercise to make exercise part of routine and to increase amount of physical activity.;Long Term: Exercising regularly at least 3-5 days a week.       Increase Strength and Stamina Yes       Intervention Provide advice, education, support and counseling about physical activity/exercise needs.;Develop an individualized exercise prescription for aerobic and resistive training based on initial evaluation findings, risk stratification, comorbidities and participant's personal goals.       Expected Outcomes Short Term: Increase workloads from initial exercise prescription for resistance, speed, and METs.;Short Term: Perform resistance training exercises routinely during rehab and add in resistance training at home;Long Term: Improve cardiorespiratory fitness, muscular endurance and strength as measured by increased METs and functional capacity (6MWT)       Able to understand and use rate of perceived exertion (RPE) scale Yes       Intervention Provide education and explanation on how to use RPE scale       Expected Outcomes Short Term: Able to use RPE daily in rehab to express subjective intensity level;Long Term:  Able to use RPE to guide intensity level when exercising independently       Able to understand and use Dyspnea scale Yes       Intervention Provide education and explanation on how to use Dyspnea scale       Expected Outcomes Short Term: Able to use Dyspnea scale daily in rehab to express subjective sense of shortness of breath during  exertion;Long Term: Able to use Dyspnea scale to guide intensity level when exercising independently       Knowledge and understanding of Target Heart Rate Range (THRR) Yes  Intervention Provide education and explanation of THRR including how the numbers were predicted and where they are located for reference       Expected Outcomes Short Term: Able to state/look up THRR;Long Term: Able to use THRR to govern intensity when exercising independently;Short Term: Able to use daily as guideline for intensity in rehab       Able to check pulse independently Yes       Intervention Provide education and demonstration on how to check pulse in carotid and radial arteries.;Review the importance of being able to check your own pulse for safety during independent exercise       Expected Outcomes Short Term: Able to explain why pulse checking is important during independent exercise;Long Term: Able to check pulse independently and accurately       Understanding of Exercise Prescription Yes       Intervention Provide education, explanation, and written materials on patient's individual exercise prescription       Expected Outcomes Short Term: Able to explain program exercise prescription;Long Term: Able to explain home exercise prescription to exercise independently                Exercise Goals Re-Evaluation :  Exercise Goals Re-Evaluation     Row Name 03/27/21 1330 04/08/21 1305           Exercise Goal Re-Evaluation   Exercise Goals Review Increase Physical Activity;Able to understand and use rate of perceived exertion (RPE) scale;Knowledge and understanding of Target Heart Rate Range (THRR);Understanding of Exercise Prescription;Increase Strength and Stamina;Able to understand and use Dyspnea scale;Able to check pulse independently Increase Physical Activity;Increase Strength and Stamina;Understanding of Exercise Prescription      Comments Reviewed RPE and dyspnea scales, THR and program  prescription with pt today.  Pt voiced understanding and was given a copy of goals to take home. Marna is off to a good start in rehab.  Her max laps were on her first day at 23 and we will continue to encourage her to conitnue to push herself.  She is up to 4 lb weights.  We will continue to monitor her progress.      Expected Outcomes Short: Use RPE daily to regulate intensity. Long: Follow program prescription in THR. Short: Work back to 23 laps Long: Continue to follow program prescription               Discharge Exercise Prescription (Final Exercise Prescription Changes):  Exercise Prescription Changes - 04/08/21 1300       Response to Exercise   Blood Pressure (Admit) 126/74    Blood Pressure (Exercise) 168/72    Blood Pressure (Exit) 114/66    Heart Rate (Admit) 90 bpm    Heart Rate (Exercise) 106 bpm    Heart Rate (Exit) 97 bpm    Oxygen Saturation (Admit) 98 %    Oxygen Saturation (Exercise) 95 %    Oxygen Saturation (Exit) 97 %    Rating of Perceived Exertion (Exercise) 15    Perceived Dyspnea (Exercise) 2    Symptoms SOB    Duration Continue with 30 min of aerobic exercise without signs/symptoms of physical distress.    Intensity THRR unchanged      Progression   Progression Continue to progress workloads to maintain intensity without signs/symptoms of physical distress.    Average METs 2.12      Resistance Training   Training Prescription Yes    Weight 4 lb    Reps 10-15  Interval Training   Interval Training No      T5 Nustep   Level 3    Minutes 15    METs 1.9      Biostep-RELP   Level 2    Minutes 15    METs 2      Track   Laps 23    Minutes 15    METs 2.25      Oxygen   Maintain Oxygen Saturation 88% or higher             Nutrition:  Target Goals: Understanding of nutrition guidelines, daily intake of sodium 1500mg , cholesterol 200mg , calories 30% from fat and 7% or less from saturated fats, daily to have 5 or more servings of  fruits and vegetables.  Education: All About Nutrition: -Group instruction provided by verbal, written material, interactive activities, discussions, models, and posters to present general guidelines for heart healthy nutrition including fat, fiber, MyPlate, the role of sodium in heart healthy nutrition, utilization of the nutrition label, and utilization of this knowledge for meal planning. Follow up email sent as well. Written material given at graduation.   Biometrics:  Pre Biometrics - 03/25/21 1704       Pre Biometrics   Height 5' 5.75" (1.67 m)    Weight 288 lb 1.6 oz (130.7 kg)    BMI (Calculated) 46.86    Single Leg Stand 8.97 seconds              Nutrition Therapy Plan and Nutrition Goals:  Nutrition Therapy & Goals - 03/25/21 1707       Intervention Plan   Intervention Prescribe, educate and counsel regarding individualized specific dietary modifications aiming towards targeted core components such as weight, hypertension, lipid management, diabetes, heart failure and other comorbidities.    Expected Outcomes Short Term Goal: Understand basic principles of dietary content, such as calories, fat, sodium, cholesterol and nutrients.;Short Term Goal: A plan has been developed with personal nutrition goals set during dietitian appointment.;Long Term Goal: Adherence to prescribed nutrition plan.             Nutrition Assessments:  MEDIFICTS Score Key: ?70 Need to make dietary changes  40-70 Heart Healthy Diet ? 40 Therapeutic Level Cholesterol Diet  Flowsheet Row Pulmonary Rehab from 03/25/2021 in [facility] Cardiac and Pulmonary Rehab  Picture Your Plate Total Score on Admission 56      Picture Your Plate Scores: <96 Unhealthy dietary pattern with much room for improvement. 41-50 Dietary pattern unlikely to meet recommendations for good health and room for improvement. 51-60 More healthful dietary pattern, with some room for improvement.  >60 Healthy dietary pattern,  although there may be some specific behaviors that could be improved.   Nutrition Goals Re-Evaluation:   Nutrition Goals Discharge (Final Nutrition Goals Re-Evaluation):   Psychosocial: Target Goals: Acknowledge presence or absence of significant depression and/or stress, maximize coping skills, provide positive support system. Participant is able to verbalize types and ability to use techniques and skills needed for reducing stress and depression.   Education: Stress, Anxiety, and Depression - Group verbal and visual presentation to define topics covered.  Reviews how body is impacted by stress, anxiety, and depression.  Also discusses healthy ways to reduce stress and to treat/manage anxiety and depression.  Written material given at graduation.   Education: Sleep Hygiene -Provides group verbal and written instruction about how sleep can affect your health.  Define sleep hygiene, discuss sleep cycles and impact of sleep habits. Review good  sleep hygiene tips.    Initial Review & Psychosocial Screening:  Initial Psych Review & Screening - 02/25/21 1543       Initial Review   Current issues with None Identified      Family Dynamics   Good Support System? Yes   sisters     Barriers   Psychosocial barriers to participate in program There are no identifiable barriers or psychosocial needs.;The patient should benefit from training in stress management and relaxation.      Screening Interventions   Interventions Encouraged to exercise;Provide feedback about the scores to participant;To provide support and resources with identified psychosocial needs    Expected Outcomes Short Term goal: Utilizing psychosocial counselor, staff and physician to assist with identification of specific Stressors or current issues interfering with healing process. Setting desired goal for each stressor or current issue identified.;Long Term Goal: Stressors or current issues are controlled or eliminated.;Short  Term goal: Identification and review with participant of any Quality of Life or Depression concerns found by scoring the questionnaire.;Long Term goal: The participant improves quality of Life and PHQ9 Scores as seen by post scores and/or verbalization of changes             Quality of Life Scores:  Scores of 19 and below usually indicate a poorer quality of life in these areas.  A difference of  2-3 points is a clinically meaningful difference.  A difference of 2-3 points in the total score of the Quality of Life Index has been associated with significant improvement in overall quality of life, self-image, physical symptoms, and general health in studies assessing change in quality of life.  PHQ-9: Recent Review Flowsheet Data     Depression screen [facility] 2/9 03/25/2021 01/22/2021 12/27/2020 10/18/2020   Decreased Interest 1 2 0 1   Down, Depressed, Hopeless 0 1 0 0   PHQ - 2 Score 1 3 0 1   Altered sleeping 0 0 - 1   Tired, decreased energy 3 3 - 1   Change in appetite 1 1 - 3   Feeling bad or failure about yourself  0 0 - 0   Trouble concentrating 0 0 - 0   Moving slowly or fidgety/restless 0 1 - 0   Suicidal thoughts 0 0 - 0   PHQ-9 Score 5 8 - 6   Difficult doing work/chores Not difficult at all Not difficult at all - Not difficult at all      Interpretation of Total Score  Total Score Depression Severity:  1-4 = Minimal depression, 5-9 = Mild depression, 10-14 = Moderate depression, 15-19 = Moderately severe depression, 20-27 = Severe depression   Psychosocial Evaluation and Intervention:  Psychosocial Evaluation - 02/25/21 1552       Psychosocial Evaluation & Interventions   Interventions Encouraged to exercise with the program and follow exercise prescription    Comments Ms. Jodie is coming to pulmonary rehab with increased dyspnea. She has noticed her breathing getting worse recently and thinks besides her current health conditions, her weight has also contributed to this.  She recently had a sleep study and is on the 3 month waiting list to get her CPAP equipment. She is very excited to get involved in pulmonary rehab and hopeful her sister will be her accountability partner. She reports no current stress concerns, that she works to maintain a low stress level because she said her brother's cause of death was related to stress. She has a great support system of family,  with her younger sister living next door. She wants to work on her stamina and get back to feeling more like herself.    Expected Outcomes Short: attend pulmonary rehab for education and exercise. Long:develop and maintain positive self care habits.    Continue Psychosocial Services  Follow up required by staff             Psychosocial Re-Evaluation:   Psychosocial Discharge (Final Psychosocial Re-Evaluation):   Education: Education Goals: Education classes will be provided on a weekly basis, covering required topics. Participant will state understanding/return demonstration of topics presented.  Learning Barriers/Preferences:  Learning Barriers/Preferences - 02/25/21 1540       Learning Barriers/Preferences   Learning Barriers None    Learning Preferences None             General Pulmonary Education Topics:  Infection Prevention: - Provides verbal and written material to individual with discussion of infection control including proper hand washing and proper equipment cleaning during exercise session. Flowsheet Row Pulmonary Rehab from 03/25/2021 in [facility] Cardiac and Pulmonary Rehab  Date 03/25/21  Educator AS  Instruction Review Code 1- Verbalizes Understanding       Falls Prevention: - Provides verbal and written material to individual with discussion of falls prevention and safety. Flowsheet Row Pulmonary Rehab from 03/25/2021 in [facility] Cardiac and Pulmonary Rehab  Date 03/25/21  Educator AS  Instruction Review Code 1- Verbalizes Understanding       Chronic Lung  Disease Review: - Group verbal instruction with posters, models, PowerPoint presentations and videos,  to review new updates, new respiratory medications, new advancements in procedures and treatments. Providing information on websites and "800" numbers for continued self-education. Includes information about supplement oxygen, available portable oxygen systems, continuous and intermittent flow rates, oxygen safety, concentrators, and Medicare reimbursement for oxygen. Explanation of Pulmonary Drugs, including class, frequency, complications, importance of spacers, rinsing mouth after steroid MDI's, and proper cleaning methods for nebulizers. Review of basic lung anatomy and physiology related to function, structure, and complications of lung disease. Review of risk factors. Discussion about methods for diagnosing sleep apnea and types of masks and machines for OSA. Includes a review of the use of types of environmental controls: home humidity, furnaces, filters, dust mite/pet prevention, HEPA vacuums. Discussion about weather changes, air quality and the benefits of nasal washing. Instruction on Warning signs, infection symptoms, calling MD promptly, preventive modes, and value of vaccinations. Review of effective airway clearance, coughing and/or vibration techniques. Emphasizing that all should Create an Action Plan. Written material given at graduation. Flowsheet Row Pulmonary Rehab from 03/25/2021 in [facility] Cardiac and Pulmonary Rehab  Education need identified 03/25/21       AED/CPR: - Group verbal and written instruction with the use of models to demonstrate the basic use of the AED with the basic ABC's of resuscitation.    Anatomy and Cardiac Procedures: - Group verbal and visual presentation and models provide information about basic cardiac anatomy and function. Reviews the testing methods done to diagnose heart disease and the outcomes of the test results. Describes the treatment choices:  Medical Management, Angioplasty, or Coronary Bypass Surgery for treating various heart conditions including Myocardial Infarction, Angina, Valve Disease, and Cardiac Arrhythmias.  Written material given at graduation.   Medication Safety: - Group verbal and visual instruction to review commonly prescribed medications for heart and lung disease. Reviews the medication, class of the drug, and side effects. Includes the steps to properly store meds and maintain the prescription regimen.  Written material given at graduation.   Other: -Provides group and verbal instruction on various topics (see comments)   Knowledge Questionnaire Score:  Knowledge Questionnaire Score - 03/25/21 1709       Knowledge Questionnaire Score   Pre Score 15/18              Core Components/Risk Factors/Patient Goals at Admission:  Personal Goals and Risk Factors at Admission - 03/25/21 1705       Core Components/Risk Factors/Patient Goals on Admission    Weight Management Yes;Obesity;Weight Loss    Intervention Weight Management: Develop a combined nutrition and exercise program designed to reach desired caloric intake, while maintaining appropriate intake of nutrient and fiber, sodium and fats, and appropriate energy expenditure required for the weight goal.;Weight Management: Provide education and appropriate resources to help participant work on and attain dietary goals.;Weight Management/Obesity: Establish reasonable short term and long term weight goals.;Obesity: Provide education and appropriate resources to help participant work on and attain dietary goals.    Admit Weight 288 lb 1.6 oz (130.7 kg)    Goal Weight: Short Term 275 lb (124.7 kg)    Goal Weight: Long Term 260 lb (117.9 kg)    Expected Outcomes Short Term: Continue to assess and modify interventions until short term weight is achieved;Long Term: Adherence to nutrition and physical activity/exercise program aimed toward attainment of  established weight goal;Weight Loss: Understanding of general recommendations for a balanced deficit meal plan, which promotes 1-2 lb weight loss per week and includes a negative energy balance of [phone] kcal/d;Understanding recommendations for meals to include 15-35% energy as protein, 25-35% energy from fat, 35-60% energy from carbohydrates, less than 200mg  of dietary cholesterol, 20-35 gm of total fiber daily;Understanding of distribution of calorie intake throughout the day with the consumption of 4-5 meals/snacks    Improve shortness of breath with ADL's Yes    Intervention Provide education, individualized exercise plan and daily activity instruction to help decrease symptoms of SOB with activities of daily living.    Expected Outcomes Short Term: Improve cardiorespiratory fitness to achieve a reduction of symptoms when performing ADLs;Long Term: Be able to perform more ADLs without symptoms or delay the onset of symptoms    Increase knowledge of respiratory medications and ability to use respiratory devices properly  Yes    Intervention Provide education and demonstration as needed of appropriate use of medications, inhalers, and oxygen therapy.    Expected Outcomes Short Term: Achieves understanding of medications use. Understands that oxygen is a medication prescribed by physician. Demonstrates appropriate use of inhaler and oxygen therapy.;Long Term: Maintain appropriate use of medications, inhalers, and oxygen therapy.    Hypertension Yes    Intervention Provide education on lifestyle modifcations including regular physical activity/exercise, weight management, moderate sodium restriction and increased consumption of fresh fruit, vegetables, and low fat dairy, alcohol moderation, and smoking cessation.;Monitor prescription use compliance.    Expected Outcomes Short Term: Continued assessment and intervention until BP is < 140/9mm HG in hypertensive participants. < 130/41mm HG in hypertensive  participants with diabetes, heart failure or chronic kidney disease.;Long Term: Maintenance of blood pressure at goal levels.    Lipids Yes    Intervention Provide education and support for participant on nutrition & aerobic/resistive exercise along with prescribed medications to achieve LDL 70mg , HDL >40mg .    Expected Outcomes Short Term: Participant states understanding of desired cholesterol values and is compliant with medications prescribed. Participant is following exercise prescription and nutrition guidelines.;Long Term: Cholesterol controlled with  medications as prescribed, with individualized exercise RX and with personalized nutrition plan. Value goals: LDL < 70mg , HDL > 40 mg.             Education:Diabetes - Individual verbal and written instruction to review signs/symptoms of diabetes, desired ranges of glucose level fasting, after meals and with exercise. Acknowledge that pre and post exercise glucose checks will be done for 3 sessions at entry of program.   Know Your Numbers and Heart Failure: - Group verbal and visual instruction to discuss disease risk factors for cardiac and pulmonary disease and treatment options.  Reviews associated critical values for Overweight/Obesity, Hypertension, Cholesterol, and Diabetes.  Discusses basics of heart failure: signs/symptoms and treatments.  Introduces Heart Failure Zone chart for action plan for heart failure.  Written material given at graduation.   Core Components/Risk Factors/Patient Goals Review:    Core Components/Risk Factors/Patient Goals at Discharge (Final Review):    ITP Comments:  ITP Comments     Row Name 02/25/21 1546 03/25/21 1654 03/27/21 1330 04/10/21 0914     ITP Comments Initial telephone orientation completed. Diagnosis can be found in [facility] 11/29. EP Orientation scheduled for Monday 1/9 at 3pm. Completed 6MWT and gym orientation. Initial ITP created and sent for review to Dr. Zetta Bills, Medical  Director. First full day of exercise!  Patient was oriented to gym and equipment including functions, settings, policies, and procedures.  Patient's individual exercise prescription and treatment plan were reviewed.  All starting workloads were established based on the results of the 6 minute walk test done at initial orientation visit.  The plan for exercise progression was also introduced and progression will be customized based on patient's performance and goals. 30 Day review completed. Medical Director ITP review done, changes made as directed, and signed approval by Medical Director.   new to program             Comments:

## 2021-04-10 NOTE — Progress Notes (Signed)
Chief Complaint:   OBESITY Paige Hester is here to discuss her progress with her obesity treatment plan along with follow-up of her obesity related diagnoses. See Medical Weight Management Flowsheet for complete bioelectrical impedance results.  Today's visit was #: 3 Starting weight: 280 lbs Starting date: 01/22/2021 Weight change since last visit: 2 lbs Total lbs lost to date: 0  Nutrition Plan: Keeping a food journal and adhering to recommended goals of 1200 calories and 95 grams of protein daily for 0% of the time. Activity: Rehab for 15-20 minutes 3 times per week.  Anti-obesity medications: Ozempic 0.25 mg subcutaneously weekly. Reported side effects: None.  Interim History: Paige Hester will be restarting Ozempic this week (had 1 dose prior to visiting family in [place]).  She is doing Pulmonary Rehab at [facility].  She says she likes it.  Assessment/Plan:   1. Impaired fasting glucose, with polyphagia Not at goal. Current treatment: None.    Plan:  She will restart Ozempic 0.25 mg subcutaneously weekly.  She will continue to focus on protein-rich, low simple carbohydrate foods. We reviewed the importance of hydration, regular exercise for stress reduction, and restorative sleep.  2. Pure hypercholesterolemia Course: Controlled. Lipid-lowering medications: Crestor 20 mg daily.   Plan: Dietary changes: Increase soluble fiber, decrease simple carbohydrates, decrease saturated fat. Exercise changes: Moderate to vigorous-intensity aerobic activity 150 minutes per week or as tolerated. We will continue to monitor along with PCP/specialists as it pertains to her weight loss journey.  Lab Results  Component Value Date   CHOL 114 02/12/2021   HDL 57.00 02/12/2021   LDLCALC 47 02/12/2021   TRIG 46.0 02/12/2021   CHOLHDL 2 02/12/2021   Lab Results  Component Value Date   ALT 31 12/13/2020   AST 30 12/13/2020   ALKPHOS 63 12/13/2020   BILITOT 0.7 12/13/2020   3. Essential  hypertension Elevated today. Medications: Norvasc 10 mg daily, Hyzaar 100-25 mg daily.   Plan: Avoid buying foods that are: processed, frozen, or prepackaged to avoid excess salt. We will watch for signs of hypotension as she continues lifestyle modifications.  BP Readings from Last 3 Encounters:  04/04/21 (!) 143/73  03/25/21 122/70  02/20/21 (!) 178/77   Lab Results  Component Value Date   CREATININE 1.00 02/12/2021   4. Dyspnea on exertion In Pulmonary Rehab. Will continue to monitor as it relates to her weight loss journey.  5. Obesity with current BMI of 48.1  Course: Paige Hester is currently in the action stage of change. As such, her goal is to continue with weight loss efforts.   Nutrition goals: She has agreed to keeping a food journal and adhering to recommended goals of 1200 calories and 95 grams of protein.   Exercise goals:  As is.  Behavioral modification strategies: increasing lean protein intake, decreasing simple carbohydrates, increasing vegetables, and increasing water intake.  Paige Hester has agreed to follow-up with our clinic in 4 weeks. She was informed of the importance of frequent follow-up visits to maximize her success with intensive lifestyle modifications for her multiple health conditions.   Objective:   Blood pressure (!) 143/73, temperature 98 F (36.7 C), temperature source Oral, height 5\' 4"  (1.626 m), weight 280 lb (127 kg), SpO2 99 %. Body mass index is 48.06 kg/m.  General: Cooperative, alert, well developed, in no acute distress. HEENT: Conjunctivae and lids unremarkable. Cardiovascular: Regular rhythm.  Lungs: Normal work of breathing. Neurologic: No focal deficits.   Lab Results  Component Value Date  CREATININE 1.00 02/12/2021   BUN 12 02/12/2021   NA 139 02/12/2021   K 3.7 02/12/2021   CL 103 02/12/2021   CO2 30 02/12/2021   Lab Results  Component Value Date   ALT 31 12/13/2020   AST 30 12/13/2020   ALKPHOS 63 12/13/2020    BILITOT 0.7 12/13/2020   Lab Results  Component Value Date   HGBA1C 5.6 01/22/2021   HGBA1C 6.1 10/18/2020   HGBA1C 6.0 11/18/2019   HGBA1C 5.9 (A) 03/03/2019   HGBA1C 6.2 08/27/2018   Lab Results  Component Value Date   INSULIN 14.8 01/22/2021   Lab Results  Component Value Date   TSH 2.930 01/22/2021   Lab Results  Component Value Date   CHOL 114 02/12/2021   HDL 57.00 02/12/2021   LDLCALC 47 02/12/2021   TRIG 46.0 02/12/2021   CHOLHDL 2 02/12/2021   Lab Results  Component Value Date   VD25OH 30.8 01/22/2021   VD25OH 54.66 10/10/2015   VD25OH 26 (L) 09/13/2008   Lab Results  Component Value Date   WBC 5.5 12/13/2020   HGB 13.0 12/13/2020   HCT 38.9 12/13/2020   MCV 85.1 12/13/2020   PLT 237 12/13/2020   Attestation Statements:   Reviewed by clinician on day of visit: allergies, medications, problem list, medical history, surgical history, family history, social history, and previous encounter notes.  I, Water quality scientist, CMA, am acting as transcriptionist for Briscoe Deutscher, DO  I have reviewed the above documentation for accuracy and completeness, and I agree with the above. -  Briscoe Deutscher, DO, MS, FAAFP, DABOM - Family and Bariatric Medicine.

## 2021-04-11 ENCOUNTER — Other Ambulatory Visit: Payer: Self-pay

## 2021-04-11 DIAGNOSIS — R0600 Dyspnea, unspecified: Secondary | ICD-10-CM

## 2021-04-11 NOTE — Progress Notes (Signed)
Daily Session Note  Patient Details  Name: Paige Hester MRN: [id number] Date of Birth: April 09, 1955 Referring Provider:   Flowsheet Row Pulmonary Rehab from 03/25/2021 in [facility] Cardiac and Pulmonary Rehab  Referring Provider Dr. Chesley Mires       Encounter Date: 04/11/2021  Check In:  Session Check In - 04/11/21 1343       Check-In   Supervising physician immediately available to respond to emergencies See telemetry face sheet for immediately available ER MD    Location [facility]-Cardiac & Pulmonary Rehab    Staff Present Hope Budds, RDN, LDN;Joseph [place], [place];Vida Rigger, RN, BSN    Virtual Visit No    Medication changes reported     No    Fall or balance concerns reported    No    Tobacco Cessation No Change    Warm-up and Cool-down Performed on first and last piece of equipment    Resistance Training Performed Yes    VAD Patient? No    PAD/SET Patient? No      Pain Assessment   Currently in Pain? No/denies                Social History   Tobacco Use  Smoking Status Former   Packs/day: 1.00   Years: 27.00   Pack years: 27.00   Types: Cigarettes   Quit date: 1999   Years since quitting: 24.0  Smokeless Tobacco Never    Goals Met:  Proper associated with RPD/PD & O2 Sat Independence with exercise equipment Exercise tolerated well No report of concerns or symptoms today Strength training completed today  Goals Unmet:  Not Applicable  Comments: Pt able to follow exercise prescription today without complaint.  Will continue to monitor for progression.   Dr. Emily Filbert is Medical Director for [place].  Dr. Ottie Glazier is Medical Director for [facility] Pulmonary Rehabilitation.

## 2021-04-15 ENCOUNTER — Other Ambulatory Visit: Payer: Self-pay

## 2021-04-15 ENCOUNTER — Telehealth: Payer: Self-pay | Admitting: *Deleted

## 2021-04-15 DIAGNOSIS — R0600 Dyspnea, unspecified: Secondary | ICD-10-CM | POA: Diagnosis not present

## 2021-04-15 NOTE — Progress Notes (Signed)
Completed initial RD consultation ?

## 2021-04-15 NOTE — Telephone Encounter (Signed)
Pt left voicemail to be called for an appointment, attempted to call pt back, left message for her to call back

## 2021-04-15 NOTE — Progress Notes (Signed)
Daily Session Note  Patient Details  Name: Paige Hester MRN: [id number] Date of Birth: May 09, 1955 Referring Provider:   Flowsheet Row Pulmonary Rehab from 03/25/2021 in [facility] Cardiac and Pulmonary Rehab  Referring Provider Dr. Chesley Mires       Encounter Date: 04/15/2021  Check In:  Session Check In - 04/15/21 1330       Check-In   Supervising physician immediately available to respond to emergencies See telemetry face sheet for immediately available ER MD    Location [facility]-Cardiac & Pulmonary Rehab    Staff Present Birdie Sons, MPA, RN;Joseph Garnette Czech [place], MS, ASCM CEP, Exercise Physiologist    Virtual Visit No    Medication changes reported     No    Fall or balance concerns reported    No    Tobacco Cessation No Change    Warm-up and Cool-down Performed on first and last piece of equipment    Resistance Training Performed Yes    VAD Patient? No    PAD/SET Patient? No      Pain Assessment   Currently in Pain? No/denies                Social History   Tobacco Use  Smoking Status Former   Packs/day: 1.00   Years: 27.00   Pack years: 27.00   Types: Cigarettes   Quit date: 1999   Years since quitting: 24.0  Smokeless Tobacco Never    Goals Met:  Independence with exercise equipment Exercise tolerated well No report of concerns or symptoms today Strength training completed today  Goals Unmet:  Not Applicable  Comments: Pt able to follow exercise prescription today without complaint.  Will continue to monitor for progression.    Dr. Emily Filbert is Medical Director for [place].  Dr. Ottie Glazier is Medical Director for [facility] Pulmonary Rehabilitation.

## 2021-04-17 ENCOUNTER — Other Ambulatory Visit: Payer: Self-pay | Admitting: Primary Care

## 2021-04-17 DIAGNOSIS — I10 Essential (primary) hypertension: Secondary | ICD-10-CM

## 2021-04-18 ENCOUNTER — Other Ambulatory Visit: Payer: Self-pay

## 2021-04-18 ENCOUNTER — Encounter: Payer: Medicare Other | Attending: Pulmonary Disease | Admitting: *Deleted

## 2021-04-18 DIAGNOSIS — R0600 Dyspnea, unspecified: Secondary | ICD-10-CM | POA: Diagnosis present

## 2021-04-18 NOTE — Progress Notes (Signed)
Daily Session Note  Patient Details  Name: Paige Hester MRN: [id number] Date of Birth: [birth year] Referring Provider:   Flowsheet Row Pulmonary Rehab from 03/25/2021 in [facility] Cardiac and Pulmonary Rehab  Referring Provider Dr. Chesley Mires       Encounter Date: 04/18/2021  Check In:  Session Check In - 04/18/21 1345       Check-In   Supervising physician immediately available to respond to emergencies See telemetry face sheet for immediately available ER MD    Location [facility]-Cardiac & Pulmonary Rehab    Staff Present Renita Papa, RN BSN;Joseph [place], RCP,RRT,BSRT;Jessica [place], [place], RCEP, CCRP, CCET    Virtual Visit No    Medication changes reported     No    Fall or balance concerns reported    No    Warm-up and Cool-down Performed on first and last piece of equipment    Resistance Training Performed Yes    VAD Patient? No    PAD/SET Patient? No      Pain Assessment   Currently in Pain? No/denies                Social History   Tobacco Use  Smoking Status Former   Packs/day: 1.00   Years: 27.00   Pack years: 27.00   Types: Cigarettes   Quit date: 1999   Years since quitting: 24.1  Smokeless Tobacco Never    Goals Met:  Independence with exercise equipment Exercise tolerated well No report of concerns or symptoms today Strength training completed today  Goals Unmet:  Not Applicable  Comments: Pt able to follow exercise prescription today without complaint.  Will continue to monitor for progression.    Dr. Emily Filbert is Medical Director for [place].  Dr. Ottie Glazier is Medical Director for [facility] Pulmonary Rehabilitation.

## 2021-04-22 ENCOUNTER — Other Ambulatory Visit: Payer: Self-pay

## 2021-04-22 DIAGNOSIS — R0600 Dyspnea, unspecified: Secondary | ICD-10-CM

## 2021-04-22 NOTE — Progress Notes (Signed)
Daily Session Note  Patient Details  Name: KIOWA PEIFER MRN: [id number] Date of Birth: Jun 03, 1955 Referring Provider:   Flowsheet Row Pulmonary Rehab from 03/25/2021 in [facility] Card[facility]  Referring Provider Dr. Chesley Mires       Encounter Date: 04/22/2021  Check In:  Session Check In - 04/22/21 1341       Check-In   Supervising physician immediately available to respond to emergencies See telemetry face sheet for immediately available ER MD    Location [facility]-Cardiac & Pulmonary Rehab    Staff Present Birdie Sons, MPA, RN;Joseph Garnette Czech Camino, MS, ASCM CEP, Exercise Physiologist    Virtual Visit No    Medication changes reported     No    Fall or balance concerns reported    No    Tobacco Cessation No Change    Warm-up and Cool-down Performed on first and last piece of equipment    Resistance Training Performed Yes    VAD Patient? No    PAD/SET Patient? No      Pain Assessment   Currently in Pain? No/denies                Social History   Tobacco Use  Smoking Status Former   Packs/day: 1.00   Years: 27.00   Pack years: 27.00   Types: Cigarettes   Quit date: 1999   Years since quitting: 24.1  Smokeless Tobacco Never    Goals Met:  Independence with exercise equipment Exercise tolerated well No report of concerns or symptoms today Strength training completed today  Goals Unmet:  Not Applicable  Comments: Pt able to follow exercise prescription today without complaint.  Will continue to monitor for progression. Reviewed home exercise with pt today.  Pt plans to walk and use staff videos for exercise.  Reviewed THR, pulse, RPE, sign and symptoms, pulse oximetery and when to call 911 or MD.  Also discussed weather considerations and indoor options.  Pt voiced understanding.    Dr. Emily Filbert is Medical Director for [facility].  Dr. Ottie Glazier is Medical Director for [facility] Pulmonary  Rehabilitation.

## 2021-04-24 ENCOUNTER — Ambulatory Visit (INDEPENDENT_AMBULATORY_CARE_PROVIDER_SITE_OTHER): Payer: Medicare Other | Admitting: Obstetrics & Gynecology

## 2021-04-24 ENCOUNTER — Other Ambulatory Visit: Payer: Self-pay

## 2021-04-24 ENCOUNTER — Other Ambulatory Visit (HOSPITAL_COMMUNITY)
Admission: RE | Admit: 2021-04-24 | Discharge: 2021-04-24 | Disposition: A | Discharge status: Home / Self Care | Payer: Medicare Other | Source: Ambulatory Visit | Attending: Obstetrics & Gynecology | Admitting: Obstetrics & Gynecology

## 2021-04-24 ENCOUNTER — Encounter: Payer: Self-pay | Admitting: Obstetrics & Gynecology

## 2021-04-24 VITALS — BP 125/76 | HR 75 | Ht 65.0 in | Wt 284.0 lb

## 2021-04-24 DIAGNOSIS — Z01419 Encounter for gynecological examination (general) (routine) without abnormal findings: Secondary | ICD-10-CM | POA: Insufficient documentation

## 2021-04-24 DIAGNOSIS — N950 Postmenopausal bleeding: Secondary | ICD-10-CM

## 2021-04-24 DIAGNOSIS — Z1151 Encounter for screening for human papillomavirus (HPV): Secondary | ICD-10-CM | POA: Insufficient documentation

## 2021-04-24 DIAGNOSIS — Z124 Encounter for screening for malignant neoplasm of cervix: Secondary | ICD-10-CM

## 2021-04-24 NOTE — Progress Notes (Signed)
Stopped megace in Dec 2022. Patient currently in pulmonary rehab for blood clot in lung in April 2022. Up to date on Mammogram and bone density.Kathrene Alu RN

## 2021-04-24 NOTE — Progress Notes (Signed)
GYNECOLOGY ANNUAL PREVENTATIVE CARE ENCOUNTER NOTE  History:     Paige Hester is a 66 y.o. [phone] female here for a routine annual gynecologic exam.  Current complaints: was being treated with Megace for postmenopausal bleeding and thickened endometrium ([place] endometrial biopsy 07/27/2019), but she discontinued this by herself around 02/2021 and has had bleeding every day since.  She was concerned this was causing her HTN, pulmonary embolus and edema; although she had multiple risk factors for these conditions.  Was on Eliquis for three months, no longer on this, was on megace during that time and did not have bleeding.    Denies pelvic pain, problems with intercourse or other gynecologic concerns.    Gynecologic History No LMP recorded. Patient is postmenopausal. Last Pap: 10/07/2018. Result was normal with negative HPV Last Mammogram: 02/19/2021.  Result was benign Last Colonoscopy: 09/24/2018.  Result was benign with polyps, repeat recommended in 5 years  Obstetric History OB History  Gravida Para Term Preterm AB Living  3 1     2 1   SAB IAB Ectopic Multiple Live Births    2     1    # Outcome Date GA Lbr Len/2nd Weight Sex Delivery Anes PTL Lv  3 Para 04/29/92     Vag-Spont     2 IAB           1 IAB             Past Medical History:  Diagnosis Date   Allergy    Bilateral swelling of feet and ankles    Gallbladder polyp 2012   GERD (gastroesophageal reflux disease)    Hyperlipidemia    Hypertension    Iron deficiency anemia    Menopausal symptoms    since 2007   Murmur, cardiac    Other fatigue    Prediabetes    Pulmonary embolism ([place]) 06/2020   Shortness of breath    Shortness of breath on exertion    Vitamin D deficiency     Past Surgical History:  Procedure Laterality Date   COLONOSCOPY  2010   2020   HYSTEROSCOPY WITH D & C N/A 07/27/2019   Procedure: DILATATION AND CURETTAGE /HYSTEROSCOPY, Polypectomy;  Surgeon: Osborne Oman, MD;  Location:  [facility];  Service: Gynecology;  Laterality: N/A;    Current Outpatient Medications on File Prior to Visit  Medication Sig Dispense Refill   albuterol (VENTOLIN HFA) 108 (90 Base) MCG/ACT inhaler Inhale 2 puffs into the lungs every 6 (six) hours as needed for wheezing or shortness of breath. 8 g 2   amLODipine (NORVASC) 10 MG tablet Take 1 tablet (10 mg total) by mouth daily. For blood pressure. 90 tablet 0   Cholecalciferol (VITAMIN D3) 1000 UNITS tablet Take 1,000 Units by mouth daily.     docusate sodium (COLACE) 100 MG capsule Take 1 capsule (100 mg total) by mouth 2 (two) times daily as needed for mild constipation or moderate constipation. 30 capsule 2   fluticasone (FLONASE) 50 MCG/ACT nasal spray Place 1 spray into both nostrils 2 (two) times daily as needed for allergies or rhinitis. 16 g 0   loratadine (CLARITIN) 10 MG tablet Take 10 mg by mouth daily.     losartan-hydrochlorothiazide (HYZAAR) 100-25 MG tablet Take 1 tablet by mouth once daily for blood pressure 90 tablet 1   Melatonin 10 MG TABS Take by mouth as needed.     omeprazole (PRILOSEC) 20 MG capsule Take 20  mg by mouth every other day.     Potassium 99 MG TABS Take 1 tablet by mouth in the morning and at bedtime.     rosuvastatin (CRESTOR) 20 MG tablet Take 1 tablet (20 mg total) by mouth daily. 90 tablet 3   Semaglutide,0.25 or 0.5MG /DOS, (OZEMPIC, 0.25 OR 0.5 MG/DOSE,) 2 MG/1.5ML SOPN Inject 0.25 mg into the skin once a week. 1.5 mL 0   apixaban (ELIQUIS) 2.5 MG TABS tablet Take 1 tablet (2.5 mg total) by mouth 2 (two) times daily as needed. For DVT prevention during travel 60 tablet 3   No current facility-administered medications on file prior to visit.    No Known Allergies  Social History:  reports that she quit smoking about 24 years ago. Her smoking use included cigarettes. She has a 27.00 pack-year smoking history. She has never used smokeless tobacco. She reports current alcohol use of about 7.0  standard drinks per week. She reports that she does not use drugs.  Family History  Problem Relation Age of Onset   Depression Mother    Stroke Mother    Sudden death Mother    Heart disease Mother    Hypertension Mother    Heart attack Mother 58   Cancer Father    Prostate cancer Father    Heart disease Father 55       CAD, STENT   Coronary artery disease Sister        stent x 2   Coronary artery disease Sister 72       stent placed   Colon cancer Maternal Aunt    Heart disease Brother        Myocardial infarction   Sudden death Other    Depression Other    Colon polyps Neg Hx    Esophageal cancer Neg Hx    Rectal cancer Neg Hx    Stomach cancer Neg Hx    Breast cancer Neg Hx     The following portions of the patient's history were reviewed and updated as appropriate: allergies, current medications, past family history, past medical history, past social history, past surgical history and problem list.  Review of Systems Pertinent items noted in HPI and remainder of comprehensive ROS otherwise negative.  Physical Exam:  BP 125/76    Pulse 75    Ht 5\' 5"  (1.651 m)    Wt 284 lb (128.8 kg)    BMI 47.26 kg/m  CONSTITUTIONAL: Well-developed, well-nourished female in no acute distress.  HENT:  Normocephalic, atraumatic, External right and left ear normal.  EYES: Conjunctivae and EOM are normal. Pupils are equal, round, and reactive to light. No scleral icterus.  NECK: Normal range of motion, supple, no masses.  Normal thyroid.  SKIN: Skin is warm and dry. No rash noted. Not diaphoretic. No erythema. No pallor. MUSCULOSKELETAL: Normal range of motion. No tenderness.  No cyanosis, clubbing, or edema.   NEUROLOGIC: Alert and oriented to person, place, and time. Normal reflexes, muscle tone coordination. No cranial nerve deficit noted. PSYCHIATRIC: Normal mood and affect. Normal behavior. Normal judgment and thought content. CARDIOVASCULAR: Normal heart rate noted, regular  rhythm RESPIRATORY: Clear to auscultation bilaterally. Effort and breath sounds normal, no problems with respiration noted. BREASTS: Symmetric in size. No masses, tenderness, skin changes, nipple drainage, or lymphadenopathy bilaterally.  Performed in the presence of a chaperone. ABDOMEN: Soft, obese, no distention appreciated.  No tenderness, rebound or guarding.  PELVIC: Normal appearing external genitalia and urethral meatus; normal appearing vaginal mucosa  and cervix.  Small amount of bloody discharge seen, coming from cervical os.  Pap smear obtained.  Unable to palpate uterus or adnexa secondary to habitus.  Performed in the presence of a chaperone.   Assessment and Plan:   1. Postmenopausal bleeding Explained that Megace treatment was necessary given her thickened endometrium and previous PMB. Discussed etiologies of postmenopausal bleeding, concern about precancerous/hyperplasia or cancerous etiology (5 to 10% percent of cases). Also discussed role of unopposed estrogen exposure in leading to thickened or proliferative endometrium; and its possible correlation with endometrial hyperplasia/carcinoma; progesterone helps in counteracting this.  Discussed that obesity is linked to endometrial pathology given that adipose cells produce extra estrogen (estrone) which can cause the endometrium to have a significant amount of estrogen exposure. The primary goal in the diagnostic evaluation of postmenopausal women with uterine bleeding is to exclude malignancy; this includes endometrial biopsy and pelvic ultrasound.   Further diagnostic evaluation is indicated for recurrent or persistent bleeding. Patient is not a good surgical candidate given her co-morbidities.  She may need progestin IUD placement if further progestin therapy is needed as she does not want to take oral/systemic progestin.   Pap done, ultrasound ordered. Patient told she will likely return for endometrial biopsy. - Cytology - PAP - US  PELVIC COMPLETE WITH TRANSVAGINAL; Future I spent 20 minutes dedicated to the care of this patient including pre-visit review of records, face to face time with the patient discussing her postmenopausal bleeding and treatments and post visit ordering of testing.  2. Well woman exam with routine gynecological exam - Cytology - PAP Will follow up results of pap smear and manage accordingly. Mammogram and  colon cancer screening up to date Routine preventative health maintenance measures emphasized. Please refer to After Visit Summary for other counseling recommendations.    Return for for likely endometrial biopsy after ultrasound.      Verita Schneiders, MD, [place] for [employer], [place]

## 2021-04-29 LAB — CYTOLOGY - PAP
Comment: NEGATIVE
Diagnosis: NEGATIVE
High risk HPV: NEGATIVE

## 2021-05-02 ENCOUNTER — Ambulatory Visit (INDEPENDENT_AMBULATORY_CARE_PROVIDER_SITE_OTHER): Payer: Federal, State, Local not specified - PPO | Admitting: Family Medicine

## 2021-05-03 ENCOUNTER — Ambulatory Visit
Admission: RE | Admit: 2021-05-03 | Discharge: 2021-05-03 | Disposition: A | Discharge status: Home / Self Care | Payer: Medicare Other | Source: Ambulatory Visit | Attending: Obstetrics & Gynecology | Admitting: Obstetrics & Gynecology

## 2021-05-03 ENCOUNTER — Other Ambulatory Visit: Payer: Self-pay

## 2021-05-03 DIAGNOSIS — N950 Postmenopausal bleeding: Secondary | ICD-10-CM | POA: Insufficient documentation

## 2021-05-06 ENCOUNTER — Other Ambulatory Visit: Payer: Self-pay

## 2021-05-06 ENCOUNTER — Encounter: Payer: Medicare Other | Admitting: *Deleted

## 2021-05-06 DIAGNOSIS — R0600 Dyspnea, unspecified: Secondary | ICD-10-CM | POA: Diagnosis not present

## 2021-05-06 NOTE — Progress Notes (Signed)
Daily Session Note  Patient Details  Name: Paige Hester MRN: [id number] Date of Birth: 08/26/1955 Referring Provider:   Flowsheet Row Pulmonary Rehab from 03/25/2021 in [facility] Cardiac and Pulmonary Rehab  Referring Provider Dr. Chesley Mires       Encounter Date: 05/06/2021  Check In:      Social History   Tobacco Use  Smoking Status Former   Packs/day: 1.00   Years: 27.00   Pack years: 27.00   Types: Cigarettes   Quit date: 1999   Years since quitting: 24.1  Smokeless Tobacco Never    Goals Met:  Independence with exercise equipment Exercise tolerated well Strength training completed today  Goals Unmet:  Not Applicable  Comments: Pt able to follow exercise prescription today without complaint.  Will continue to monitor for progression.    Dr. Emily Filbert is Medical Director for [facility].  Dr. Ottie Glazier is Medical Director for [facility] Pulmonary Rehabilitation.

## 2021-05-08 ENCOUNTER — Other Ambulatory Visit: Payer: Self-pay

## 2021-05-08 ENCOUNTER — Encounter: Payer: Self-pay | Admitting: *Deleted

## 2021-05-08 DIAGNOSIS — R0600 Dyspnea, unspecified: Secondary | ICD-10-CM

## 2021-05-08 NOTE — Progress Notes (Signed)
Pulmonary Individual Treatment Plan  Patient Details  Name: RILEA ARUTYUNYAN MRN: [id number] Date of Birth: Jun 09, 1955 Referring Provider:   Flowsheet Row Pulmonary Rehab from 03/25/2021 in [facility] Cardiac and Pulmonary Rehab  Referring Provider Dr. Chesley Mires       Initial Encounter Date:  Flowsheet Row Pulmonary Rehab from 03/25/2021 in [facility] Cardiac and Pulmonary Rehab  Date 03/25/21       Visit Diagnosis: Dyspnea, unspecified type  Patient's Home Medications on Admission:  Current Outpatient Medications:    albuterol (VENTOLIN HFA) 108 (90 Base) MCG/ACT inhaler, Inhale 2 puffs into the lungs every 6 (six) hours as needed for wheezing or shortness of breath., Disp: 8 g, Rfl: 2   amLODipine (NORVASC) 10 MG tablet, Take 1 tablet (10 mg total) by mouth daily. For blood pressure., Disp: 90 tablet, Rfl: 0   Cholecalciferol (VITAMIN D3) 1000 UNITS tablet, Take 1,000 Units by mouth daily., Disp: , Rfl:    docusate sodium (COLACE) 100 MG capsule, Take 1 capsule (100 mg total) by mouth 2 (two) times daily as needed for mild constipation or moderate constipation., Disp: 30 capsule, Rfl: 2   fluticasone (FLONASE) 50 MCG/ACT nasal spray, Place 1 spray into both nostrils 2 (two) times daily as needed for allergies or rhinitis., Disp: 16 g, Rfl: 0   loratadine (CLARITIN) 10 MG tablet, Take 10 mg by mouth daily., Disp: , Rfl:    losartan-hydrochlorothiazide (HYZAAR) 100-25 MG tablet, Take 1 tablet by mouth once daily for blood pressure, Disp: 90 tablet, Rfl: 1   Melatonin 10 MG TABS, Take by mouth as needed., Disp: , Rfl:    omeprazole (PRILOSEC) 20 MG capsule, Take 20 mg by mouth every other day., Disp: , Rfl:    Potassium 99 MG TABS, Take 1 tablet by mouth in the morning and at bedtime., Disp: , Rfl:    rosuvastatin (CRESTOR) 20 MG tablet, Take 1 tablet (20 mg total) by mouth daily., Disp: 90 tablet, Rfl: 3   Semaglutide,0.25 or 0.5MG/DOS, (OZEMPIC, 0.25 OR 0.5 MG/DOSE,) 2 MG/1.5ML SOPN, Inject  0.25 mg into the skin once a week., Disp: 1.5 mL, Rfl: 0  Past Medical History: Past Medical History:  Diagnosis Date   Allergy    Bilateral swelling of feet and ankles    Gallbladder polyp 2012   GERD (gastroesophageal reflux disease)    Hyperlipidemia    Hypertension    Iron deficiency anemia    Menopausal symptoms    since 2007   Murmur, cardiac    Other fatigue    Prediabetes    Pulmonary embolism ([place]) 06/2020   Shortness of breath    Shortness of breath on exertion    Vitamin D deficiency     Tobacco Use: Social History   Tobacco Use  Smoking Status Former   Packs/day: 1.00   Years: 27.00   Pack years: 27.00   Types: Cigarettes   Quit date: 1999   Years since quitting: 24.1  Smokeless Tobacco Never    Labs: Recent Review Scientist, physiological     Labs for ITP Cardiac and Pulmonary Rehab Latest Ref Rng & Units 03/03/2019 11/18/2019 10/18/2020 01/22/2021 02/12/2021   Cholestrol 0 - 200 mg/dL - 158 108 130 114   LDLCALC 0 - 99 mg/dL - 105(H) 54 58 47   HDL >39.00 mg/dL - 42.10 45.60 61 57.00   Trlycerides 0.0 - 149.0 mg/dL - 52.0 43.0 44 46.0   Hemoglobin A1c 4.8 - 5.6 % 5.9(A) 6.0 6.1 5.6 -  Pulmonary Assessment Scores:  Pulmonary Assessment Scores     Row Name 03/25/21 1710         ADL UCSD   SOB Score total 72     Rest 0     Walk 4     Stairs 5     Bath 2     Dress 3     Shop 4       CAT Score   CAT Score 19       mMRC Score   mMRC Score 3              UCSD: Self-administered rating of dyspnea associated with activities of daily living (ADLs) 6-point scale (0 = "not at all" to 5 = "maximal or unable to do because of breathlessness")  Scoring Scores range from 0 to 120.  Minimally important difference is 5 units  CAT: CAT can identify the health impairment of COPD patients and is better correlated with disease progression.  CAT has a scoring range of zero to 40. The CAT score is classified into four groups of low (less than 10),  medium (10 - 20), high (21-30) and very high (31-40) based on the impact level of disease on health status. A CAT score over 10 suggests significant symptoms.  A worsening CAT score could be explained by an exacerbation, poor medication adherence, poor inhaler technique, or progression of COPD or comorbid conditions.  CAT MCID is 2 points  mMRC: mMRC (Modified Medical Research Council) Dyspnea Scale is used to assess the degree of baseline functional disability in patients of respiratory disease due to dyspnea. No minimal important difference is established. A decrease in score of 1 point or greater is considered a positive change.   Pulmonary Function Assessment:   Exercise Target Goals: Exercise Program Goal: Individual exercise prescription set using results from initial 6 min walk test and THRR while considering  patients activity barriers and safety.   Exercise Prescription Goal: Initial exercise prescription builds to 30-45 minutes a day of aerobic activity, 2-3 days per week.  Home exercise guidelines will be given to patient during program as part of exercise prescription that the participant will acknowledge.  Education: Aerobic Exercise: - Group verbal and visual presentation on the components of exercise prescription. Introduces F.I.T.T principle from ACSM for exercise prescriptions.  Reviews F.I.T.T. principles of aerobic exercise including progression. Written material given at graduation.   Education: Resistance Exercise: - Group verbal and visual presentation on the components of exercise prescription. Introduces F.I.T.T principle from ACSM for exercise prescriptions  Reviews F.I.T.T. principles of resistance exercise including progression. Written material given at graduation.    Education: Exercise & Equipment Safety: - Individual verbal instruction and demonstration of equipment use and safety with use of the equipment. Flowsheet Row Pulmonary Rehab from 03/25/2021 in [facility]  Cardiac and Pulmonary Rehab  Date 03/25/21  Educator AS  Instruction Review Code 1- Verbalizes Understanding       Education: Exercise Physiology & General Exercise Guidelines: - Group verbal and written instruction with models to review the exercise physiology of the cardiovascular system and associated critical values. Provides general exercise guidelines with specific guidelines to those with heart or lung disease.    Education: Flexibility, Balance, Mind/Body Relaxation: - Group verbal and visual presentation with interactive activity on the components of exercise prescription. Introduces F.I.T.T principle from ACSM for exercise prescriptions. Reviews F.I.T.T. principles of flexibility and balance exercise training including progression. Also discusses the mind body connection.  Reviews  various relaxation techniques to help reduce and manage stress (i.e. Deep breathing, progressive muscle relaxation, and visualization). Balance handout provided to take home. Written material given at graduation.   Activity Barriers & Risk Stratification:  Activity Barriers & Cardiac Risk Stratification - 02/25/21 1535       Activity Barriers & Cardiac Risk Stratification   Activity Barriers Shortness of Breath             6 Minute Walk:  6 Minute Walk     Row Name 03/25/21 1654         6 Minute Walk   Phase Initial     Distance 865 feet     Walk Time 6 minutes     # of Rest Breaks 0     MPH 1.64     METS 1.94     RPE 15     Perceived Dyspnea  3     VO2 Peak 6.79     Symptoms Yes (comment)     Comments Leg tiredness     Resting HR 84 bpm     Resting BP 138/80     Resting Oxygen Saturation  97 %     Exercise Oxygen Saturation  during 6 min walk 95 %     Max Ex. HR 98 bpm     Max Ex. BP 228/88     2 Minute Post BP 164/88       Interval HR   1 Minute HR 93     2 Minute HR 92     3 Minute HR 98     4 Minute HR 96     5 Minute HR 96     6 Minute HR 98     2 Minute Post HR  85     Interval Heart Rate? Yes       Interval Oxygen   Interval Oxygen? Yes     Baseline Oxygen Saturation % 97 %     1 Minute Oxygen Saturation % 95 %     1 Minute Liters of Oxygen 0 L     2 Minute Oxygen Saturation % 95 %     2 Minute Liters of Oxygen 0 L     3 Minute Oxygen Saturation % 95 %     3 Minute Liters of Oxygen 0 L     4 Minute Oxygen Saturation % 95 %     4 Minute Liters of Oxygen 0 L     5 Minute Oxygen Saturation % 95 %     5 Minute Liters of Oxygen 0 L     6 Minute Oxygen Saturation % 95 %     6 Minute Liters of Oxygen 0 L     2 Minute Post Oxygen Saturation % 95 %     2 Minute Post Liters of Oxygen 0 L             Oxygen Initial Assessment:  Oxygen Initial Assessment - 02/25/21 1539       Home Oxygen   Home Oxygen Device None    Sleep Oxygen Prescription None   on CPAP waitlist   Home Exercise Oxygen Prescription None    Home Resting Oxygen Prescription None      Initial 6 min Walk   Oxygen Used None      Program Oxygen Prescription   Program Oxygen Prescription None      Intervention   Short Term Goals To learn and understand importance  of monitoring SPO2 with pulse oximeter and demonstrate accurate use of the pulse oximeter.;To learn and understand importance of maintaining oxygen saturations>88%;To learn and demonstrate proper pursed lip breathing techniques or other breathing techniques. ;To learn and demonstrate proper use of respiratory medications    Long  Term Goals Verbalizes importance of monitoring SPO2 with pulse oximeter and return demonstration;Maintenance of O2 saturations>88%;Exhibits proper breathing techniques, such as pursed lip breathing or other method taught during program session;Compliance with respiratory medication;Demonstrates proper use of MDIs             Oxygen Re-Evaluation:  Oxygen Re-Evaluation     Row Name 03/27/21 1331 04/22/21 1343           Program Oxygen Prescription   Program Oxygen Prescription  None None        Home Oxygen   Home Oxygen Device None None      Sleep Oxygen Prescription None None      Home Exercise Oxygen Prescription None None      Home Resting Oxygen Prescription None None        Goals/Expected Outcomes   Short Term Goals To learn and understand importance of monitoring SPO2 with pulse oximeter and demonstrate accurate use of the pulse oximeter.;To learn and understand importance of maintaining oxygen saturations>88%;To learn and demonstrate proper pursed lip breathing techniques or other breathing techniques.  To learn and understand importance of monitoring SPO2 with pulse oximeter and demonstrate accurate use of the pulse oximeter.;To learn and understand importance of maintaining oxygen saturations>88%;To learn and demonstrate proper pursed lip breathing techniques or other breathing techniques.       Long  Term Goals Verbalizes importance of monitoring SPO2 with pulse oximeter and return demonstration;Maintenance of O2 saturations>88%;Exhibits proper breathing techniques, such as pursed lip breathing or other method taught during program session;Compliance with respiratory medication Verbalizes importance of monitoring SPO2 with pulse oximeter and return demonstration;Maintenance of O2 saturations>88%;Exhibits proper breathing techniques, such as pursed lip breathing or other method taught during program session;Compliance with respiratory medication      Comments Reviewed PLB technique with pt.  Talked about how it works and it's importance in maintaining their exercise saturations. Aicha has not been practicig PLB.  We reviewed technique and when to use PLB.      Goals/Expected Outcomes Short: Become more profiecient at using PLB.   Long: Become independent at using PLB. Short:  practice PLB when short of breath Long:  become proficient at PLB               Oxygen Discharge (Final Oxygen Re-Evaluation):  Oxygen Re-Evaluation - 04/22/21 1343       Program  Oxygen Prescription   Program Oxygen Prescription None      Home Oxygen   Home Oxygen Device None    Sleep Oxygen Prescription None    Home Exercise Oxygen Prescription None    Home Resting Oxygen Prescription None      Goals/Expected Outcomes   Short Term Goals To learn and understand importance of monitoring SPO2 with pulse oximeter and demonstrate accurate use of the pulse oximeter.;To learn and understand importance of maintaining oxygen saturations>88%;To learn and demonstrate proper pursed lip breathing techniques or other breathing techniques.     Long  Term Goals Verbalizes importance of monitoring SPO2 with pulse oximeter and return demonstration;Maintenance of O2 saturations>88%;Exhibits proper breathing techniques, such as pursed lip breathing or other method taught during program session;Compliance with respiratory medication    Comments Nekesha has not  been practicig PLB.  We reviewed technique and when to use PLB.    Goals/Expected Outcomes Short:  practice PLB when short of breath Long:  become proficient at PLB             Initial Exercise Prescription:  Initial Exercise Prescription - 03/25/21 1600       Date of Initial Exercise RX and Referring Provider   Date 03/25/21    Referring Provider Dr. Chesley Mires      Oxygen   Maintain Oxygen Saturation 88% or higher      Treadmill   MPH 1.2    Grade 0    Minutes 15    METs 1.92      T5 Nustep   Level 1    SPM 80    Minutes 15    METs 1.94      Biostep-RELP   Level 1    SPM 50    Minutes 15    METs 1.94      Track   Laps 10    Minutes 15    METs 1.54      Prescription Details   Frequency (times per week) 3    Duration Progress to 30 minutes of continuous aerobic without signs/symptoms of physical distress      Intensity   THRR 40-80% of Max Heartrate 112-140    Ratings of Perceived Exertion 11-13    Perceived Dyspnea 0-4      Progression   Progression Continue to progress workloads to  maintain intensity without signs/symptoms of physical distress.      Resistance Training   Training Prescription Yes    Weight 3    Reps 10-15             Perform Capillary Blood Glucose checks as needed.  Exercise Prescription Changes:   Exercise Prescription Changes     Row Name 03/25/21 1700 04/08/21 1300 04/24/21 1700         Response to Exercise   Blood Pressure (Admit) 138/80 126/74 124/62     Blood Pressure (Exercise) 228/88 168/72 138/68     Blood Pressure (Exit) 144/82 114/66 110/64     Heart Rate (Admit) 84 bpm 90 bpm 67 bpm     Heart Rate (Exercise) 98 bpm 106 bpm 101 bpm     Heart Rate (Exit) 81 bpm 97 bpm 87 bpm     Oxygen Saturation (Admit) 97 % 98 % 96 %     Oxygen Saturation (Exercise) 95 % 95 % 95 %     Oxygen Saturation (Exit) 95 % 97 % 97 %     Rating of Perceived Exertion (Exercise) _0 Perceived Dyspnea (Exercise) _1 Symptoms leg tiredness SOB SOB     Comments 6 MWT results -- --     Duration -- Continue with 30 min of aerobic exercise without signs/symptoms of physical distress. Continue with 30 min of aerobic exercise without signs/symptoms of physical distress.     Intensity -- THRR unchanged THRR unchanged       Progression   Progression -- Continue to progress workloads to maintain intensity without signs/symptoms of physical distress. Continue to progress workloads to maintain intensity without signs/symptoms of physical distress.     Average METs -- 2.12 2.22       Resistance Training   Training Prescription Yes Yes Yes     Weight 3 4 lb 4 lb  Reps 10-15 10-15 10-15       Interval Training   Interval Training -- No No       Treadmill   MPH 1.2 -- 1.2     Grade 0 -- 0     Minutes 15 -- 15     METs 1.92 -- 1.92       Recumbant Bike   Level -- -- 1     RPM -- -- 60     Watts -- -- 15     Minutes -- -- 15     METs -- -- 2.61       T5 Nustep   Level _0 SPM 80 -- --     Minutes _1 METs 1.94  1.9 2.1       Biostep-RELP   Level _2 SPM 50 -- --     Minutes _3 METs 1.94 2 2       Track   Laps _4 Minutes _5 METs 1.54 2.25 2.14       Oxygen   Maintain Oxygen Saturation -- 88% or higher 88% or higher              Exercise Comments:   Exercise Comments     Row Name 03/27/21 1330           Exercise Comments First full day of exercise!  Patient was oriented to gym and equipment including functions, settings, policies, and procedures.  Patient's individual exercise prescription and treatment plan were reviewed.  All starting workloads were established based on the results of the 6 minute walk test done at initial orientation visit.  The plan for exercise progression was also introduced and progression will be customized based on patient's performance and goals.                Exercise Goals and Review:   Exercise Goals     Row Name 03/25/21 1703             Exercise Goals   Increase Physical Activity Yes       Intervention Provide advice, education, support and counseling about physical activity/exercise needs.;Develop an individualized exercise prescription for aerobic and resistive training based on initial evaluation findings, risk stratification, comorbidities and participant's personal goals.       Expected Outcomes Short Term: Attend rehab on a regular basis to increase amount of physical activity.;Long Term: Add in home exercise to make exercise part of routine and to increase amount of physical activity.;Long Term: Exercising regularly at least 3-5 days a week.       Increase Strength and Stamina Yes       Intervention Provide advice, education, support and counseling about physical activity/exercise needs.;Develop an individualized exercise prescription for aerobic and resistive training based on initial evaluation findings, risk stratification, comorbidities and participant's personal goals.       Expected  Outcomes Short Term: Increase workloads from initial exercise prescription for resistance, speed, and METs.;Short Term: Perform resistance training exercises routinely during rehab and add in resistance training at home;Long Term: Improve cardiorespiratory fitness, muscular endurance and strength as measured by increased METs and functional capacity (6MWT)       Able to understand and use rate of perceived exertion (RPE) scale Yes  Intervention Provide education and explanation on how to use RPE scale       Expected Outcomes Short Term: Able to use RPE daily in rehab to express subjective intensity level;Long Term:  Able to use RPE to guide intensity level when exercising independently       Able to understand and use Dyspnea scale Yes       Intervention Provide education and explanation on how to use Dyspnea scale       Expected Outcomes Short Term: Able to use Dyspnea scale daily in rehab to express subjective sense of shortness of breath during exertion;Long Term: Able to use Dyspnea scale to guide intensity level when exercising independently       Knowledge and understanding of Target Heart Rate Range (THRR) Yes       Intervention Provide education and explanation of THRR including how the numbers were predicted and where they are located for reference       Expected Outcomes Short Term: Able to state/look up THRR;Long Term: Able to use THRR to govern intensity when exercising independently;Short Term: Able to use daily as guideline for intensity in rehab       Able to check pulse independently Yes       Intervention Provide education and demonstration on how to check pulse in carotid and radial arteries.;Review the importance of being able to check your own pulse for safety during independent exercise       Expected Outcomes Short Term: Able to explain why pulse checking is important during independent exercise;Long Term: Able to check pulse independently and accurately       Understanding of  Exercise Prescription Yes       Intervention Provide education, explanation, and written materials on patient's individual exercise prescription       Expected Outcomes Short Term: Able to explain program exercise prescription;Long Term: Able to explain home exercise prescription to exercise independently                Exercise Goals Re-Evaluation :  Exercise Goals Re-Evaluation     Row Name 03/27/21 1330 04/08/21 1305 04/22/21 1432 04/24/21 1802       Exercise Goal Re-Evaluation   Exercise Goals Review Increase Physical Activity;Able to understand and use rate of perceived exertion (RPE) scale;Knowledge and understanding of Target Heart Rate Range (THRR);Understanding of Exercise Prescription;Increase Strength and Stamina;Able to understand and use Dyspnea scale;Able to check pulse independently Increase Physical Activity;Increase Strength and Stamina;Understanding of Exercise Prescription Increase Strength and Stamina;Increase Physical Activity;Able to understand and use rate of perceived exertion (RPE) scale;Knowledge and understanding of Target Heart Rate Range (THRR) Increase Physical Activity;Increase Strength and Stamina    Comments Reviewed RPE and dyspnea scales, THR and program prescription with pt today.  Pt voiced understanding and was given a copy of goals to take home. Ramatoulaye is off to a good start in rehab.  Her max laps were on her first day at 23 and we will continue to encourage her to conitnue to push herself.  She is up to 4 lb weights.  We will continue to monitor her progress. Reviewed home exercise with pt today.  Pt plans to walk and use staff videos for exercise.  Reviewed THR, pulse, RPE, sign and symptoms, pulse oximetery and when to call 911 or MD.  Also discussed weather considerations and indoor options.  Pt voiced understanding. Shameca continues to do well. She was able to try the treadmill and tolerate the speed at her original  exercise prescription. She is up to  21 laps on the track and we will continue to have her increase the laps as tolerated. Her RPEs and O2 saturations are staying in appropriate range    Expected Outcomes Short: Use RPE daily to regulate intensity. Long: Follow program prescription in THR. Short: Work back to 23 laps Long: Continue to follow program prescription Short: add 1 day in addition to program sessions Long: maintain exercise independently Short: Build up tolerance on the treadmill Long: Continue to increase overall MET level             Discharge Exercise Prescription (Final Exercise Prescription Changes):  Exercise Prescription Changes - 04/24/21 1700       Response to Exercise   Blood Pressure (Admit) 124/62    Blood Pressure (Exercise) 138/68    Blood Pressure (Exit) 110/64    Heart Rate (Admit) 67 bpm    Heart Rate (Exercise) 101 bpm    Heart Rate (Exit) 87 bpm    Oxygen Saturation (Admit) 96 %    Oxygen Saturation (Exercise) 95 %    Oxygen Saturation (Exit) 97 %    Rating of Perceived Exertion (Exercise) 15    Perceived Dyspnea (Exercise) 3    Symptoms SOB    Duration Continue with 30 min of aerobic exercise without signs/symptoms of physical distress.    Intensity THRR unchanged      Progression   Progression Continue to progress workloads to maintain intensity without signs/symptoms of physical distress.    Average METs 2.22      Resistance Training   Training Prescription Yes    Weight 4 lb    Reps 10-15      Interval Training   Interval Training No      Treadmill   MPH 1.2    Grade 0    Minutes 15    METs 1.92      Recumbant Bike   Level 1    RPM 60    Watts 15    Minutes 15    METs 2.61      T5 Nustep   Level 1    Minutes 15    METs 2.1      Biostep-RELP   Level 2    Minutes 15    METs 2      Track   Laps 21    Minutes 15    METs 2.14      Oxygen   Maintain Oxygen Saturation 88% or higher             Nutrition:  Target Goals: Understanding of nutrition  guidelines, daily intake of sodium <1598m, cholesterol <2040m calories 30% from fat and 7% or less from saturated fats, daily to have 5 or more servings of fruits and vegetables.  Education: All About Nutrition: -Group instruction provided by verbal, written material, interactive activities, discussions, models, and posters to present general guidelines for heart healthy nutrition including fat, fiber, MyPlate, the role of sodium in heart healthy nutrition, utilization of the nutrition label, and utilization of this knowledge for meal planning. Follow up email sent as well. Written material given at graduation.   Biometrics:  Pre Biometrics - 03/25/21 1704       Pre Biometrics   Height 5' 5.75" (1.67 m)    Weight 288 lb 1.6 oz (130.7 kg)    BMI (Calculated) 46.86    Single Leg Stand 8.97 seconds  Nutrition Therapy Plan and Nutrition Goals:  Nutrition Therapy & Goals - 04/15/21 1310       Nutrition Therapy   Diet Heart healthy, low Na    Drug/Food Interactions Statins/Certain Fruits    Protein (specify units) 95g    Fiber 30 grams    Whole Grain Foods 3 servings    Saturated Fats 12 max. grams    Fruits and Vegetables 8 servings/day    Sodium 1.5 grams      Personal Nutrition Goals   Nutrition Goal ST: f/u appointment with RD LT: create plan for long-term lifstyle changes that work for her    Comments 66 y.o. F admitted to pulmonary rehab with primary dx dyspnea also presenting with HTN, PAD, aortic atherosclerosis, HLD. PMHx including prediabetes, GERD, pulmonary embolism. PSHx not significant. Relevant medications include vit D3, colace, K+, crestor, ozempic, megace.   PYP Score: 56. Vegetables & Fruits 7/12. Breads, Grains & Cereals 9/12. Red & Processed Meat 5/12. Poultry 0/2. Fish & Shellfish 2/4. Beans, Nuts & Seeds 0/4. Milk & Dairy Foods 6/6. Toppings, Oils, Seasonings & Salt 12/20. Sweets, Snacks & Restaurant Food 8/14. Beverages 7/10.  [facility]   [facility] prescribed a category 2 plan; explanation of this plan is not shown in notes. Addiitonally, she was instructed to increase lean protein intake, decrease simple carbohydrates, increase vegetables, and increase water. They prescirbed 1200 calorie diet with 95g of protein. B: 2 eggs and toast - she reports eggs are too expensive and would like to switch to oatmeal - suggested peanut butter to add protein and heart healthy fats. She reports only following breakfast portion of meal plan. Would like to set up seperate appointment with this RD to discuss behavior changes to ensure healthy diet without excess calories and honoring hunger.      Intervention Plan   Intervention Prescribe, educate and counsel regarding individualized specific dietary modifications aiming towards targeted core components such as weight, hypertension, lipid management, diabetes, heart failure and other comorbidities.    Expected Outcomes Short Term Goal: Understand basic principles of dietary content, such as calories, fat, sodium, cholesterol and nutrients.;Short Term Goal: A plan has been developed with personal nutrition goals set during dietitian appointment.;Long Term Goal: Adherence to prescribed nutrition plan.             Nutrition Assessments:  MEDIFICTS Score Key: ?70 Need to make dietary changes  40-70 Heart Healthy Diet ? 40 Therapeutic Level Cholesterol Diet  Flowsheet Row Pulmonary Rehab from 03/25/2021 in [facility] Cardiac and Pulmonary Rehab  Picture Your Plate Total Score on Admission 56      Picture Your Plate Scores: <15 Unhealthy dietary pattern with much room for improvement. 41-50 Dietary pattern unlikely to meet recommendations for good health and room for improvement. 51-60 More healthful dietary pattern, with some room for improvement.  >60 Healthy dietary pattern, although there may be some specific behaviors that could be improved.   Nutrition Goals Re-Evaluation:   Nutrition  Goals Discharge (Final Nutrition Goals Re-Evaluation):   Psychosocial: Target Goals: Acknowledge presence or absence of significant depression and/or stress, maximize coping skills, provide positive support system. Participant is able to verbalize types and ability to use techniques and skills needed for reducing stress and depression.   Education: Stress, Anxiety, and Depression - Group verbal and visual presentation to define topics covered.  Reviews how body is impacted by stress, anxiety, and depression.  Also discusses healthy ways to reduce stress and to treat/manage anxiety and  depression.  Written material given at graduation.   Education: Sleep Hygiene -Provides group verbal and written instruction about how sleep can affect your health.  Define sleep hygiene, discuss sleep cycles and impact of sleep habits. Review good sleep hygiene tips.    Initial Review & Psychosocial Screening:  Initial Psych Review & Screening - 02/25/21 1543       Initial Review   Current issues with None Identified      Family Dynamics   Good Support System? Yes   sisters     Barriers   Psychosocial barriers to participate in program There are no identifiable barriers or psychosocial needs.;The patient should benefit from training in stress management and relaxation.      Screening Interventions   Interventions Encouraged to exercise;Provide feedback about the scores to participant;To provide support and resources with identified psychosocial needs    Expected Outcomes Short Term goal: Utilizing psychosocial counselor, staff and physician to assist with identification of specific Stressors or current issues interfering with healing process. Setting desired goal for each stressor or current issue identified.;Long Term Goal: Stressors or current issues are controlled or eliminated.;Short Term goal: Identification and review with participant of any Quality of Life or Depression concerns found by scoring the  questionnaire.;Long Term goal: The participant improves quality of Life and PHQ9 Scores as seen by post scores and/or verbalization of changes             Quality of Life Scores:  Scores of 19 and below usually indicate a poorer quality of life in these areas.  A difference of  2-3 points is a clinically meaningful difference.  A difference of 2-3 points in the total score of the Quality of Life Index has been associated with significant improvement in overall quality of life, self-image, physical symptoms, and general health in studies assessing change in quality of life.  PHQ-9: Recent Review Flowsheet Data     Depression screen [facility] 2/9 04/22/2021 03/25/2021 01/22/2021 12/27/2020 10/18/2020   Decreased Interest _0 0 1   Down, Depressed, Hopeless 0 0 1 0 0   PHQ - 2 Score _1 0 1   Altered sleeping 2  0 0 - 1   Tired, decreased energy 0 3 3 - 1   Change in appetite 0 1 1 - 3   Feeling bad or failure about yourself  0 0 0 - 0   Trouble concentrating 2 0 0 - 0   Moving slowly or fidgety/restless 0 0 1 - 0   Suicidal thoughts 0 0 0 - 0   PHQ-9 Score _2 - 6   Difficult doing work/chores Not difficult at all Not difficult at all Not difficult at all - Not difficult at all      Interpretation of Total Score  Total Score Depression Severity:  1-4 = Minimal depression, 5-9 = Mild depression, 10-14 = Moderate depression, 15-19 = Moderately severe depression, 20-27 = Severe depression   Psychosocial Evaluation and Intervention:  Psychosocial Evaluation - 02/25/21 1552       Psychosocial Evaluation & Interventions   Interventions Encouraged to exercise with the program and follow exercise prescription    Comments Ms. Irean is coming to pulmonary rehab with increased dyspnea. She has noticed her breathing getting worse recently and thinks besides her current health conditions, her weight has also contributed to this. She recently had a sleep study and is on the 3 month waiting list  to get her  CPAP equipment. She is very excited to get involved in pulmonary rehab and hopeful her sister will be her accountability partner. She reports no current stress concerns, that she works to maintain a low stress level because she said her brother's cause of death was related to stress. She has a great support system of family, with her younger sister living next door. She wants to work on her stamina and get back to feeling more like herself.    Expected Outcomes Short: attend pulmonary rehab for education and exercise. Long:develop and maintain positive self care habits.    Continue Psychosocial Services  Follow up required by staff             Psychosocial Re-Evaluation:  Psychosocial Re-Evaluation     [place] Name 04/22/21 1350             Psychosocial Re-Evaluation   Current issues with Current Stress Concerns       Comments Reviewed PHQ -9 - score was 5.  She had a score of 2 for trouble falling/staying asleep due to using a CPAP.  Other than this she reports no sigsn of depression, anxiety or stress.  She syas she sings at home and church to help her manage stress.       Expected Outcomes Short: continue to sing and execise to help manage stress Long: maintain positive outlook                Psychosocial Discharge (Final Psychosocial Re-Evaluation):  Psychosocial Re-Evaluation - 04/22/21 1350       Psychosocial Re-Evaluation   Current issues with Current Stress Concerns    Comments Reviewed PHQ -9 - score was 5.  She had a score of 2 for trouble falling/staying asleep due to using a CPAP.  Other than this she reports no sigsn of depression, anxiety or stress.  She syas she sings at home and church to help her manage stress.    Expected Outcomes Short: continue to sing and execise to help manage stress Long: maintain positive outlook             Education: Education Goals: Education classes will be provided on a weekly basis, covering required topics.  Participant will state understanding/return demonstration of topics presented.  Learning Barriers/Preferences:  Learning Barriers/Preferences - 02/25/21 1540       Learning Barriers/Preferences   Learning Barriers None    Learning Preferences None             General Pulmonary Education Topics:  Infection Prevention: - Provides verbal and written material to individual with discussion of infection control including proper hand washing and proper equipment cleaning during exercise session. Flowsheet Row Pulmonary Rehab from 03/25/2021 in [facility] Cardiac and Pulmonary Rehab  Date 03/25/21  Educator AS  Instruction Review Code 1- Verbalizes Understanding       Falls Prevention: - Provides verbal and written material to individual with discussion of falls prevention and safety. Flowsheet Row Pulmonary Rehab [facility] Pulmonary Re[facility]  Date 03/25/21  Educator AS  Instruction Review Code 1- Verbalizes Understanding       Chronic Lung Disease Review: - Group verbal instruction with posters, models, PowerPoint presentations and videos,  to review new updates, new respiratory medications, new advancements in procedures and treatments. Providing information on websites and "800" numbers for continued self-education. Includes information about supplement oxygen, available portable oxygen systems, continuous and intermittent flow rates, oxygen safety, concentrators, and Medicare reimbursement for oxygen. Explanation of  Pulmonary Drugs, including class, frequency, complications, importance of spacers, rinsing mouth after steroid MDI's, and proper cleaning methods for nebulizers. Review of basic lung anatomy and physiology related to function, structure, and complications of lung disease. Review of risk factors. Discussion about methods for diagnosing sleep apnea and types of masks and machines for OSA. Includes a review of the use of types of environmental controls: home  humidity, furnaces, filters, dust mite/pet prevention, HEPA vacuums. Discussion about weather changes, air quality and the benefits of nasal washing. Instruction on Warning signs, infection symptoms, calling MD promptly, preventive modes, and value of vaccinations. Review of effective airway clearance, coughing and/or vibration techniques. Emphasizing that all should Create an Action Plan. Written material given at graduation. Flowsheet Row Pulmonary Rehab from 03/25/2021 in [facility] Cardiac and Pulmonary Rehab  Education need identified 03/25/21       AED/CPR: - Group verbal and written instruction with the use of models to demonstrate the basic use of the AED with the basic ABC's of resuscitation.    Anatomy and Cardiac Procedures: - Group verbal and visual presentation and models provide information about basic cardiac anatomy and function. Reviews the testing methods done to diagnose heart disease and the outcomes of the test results. Describes the treatment choices: Medical Management, Angioplasty, or Coronary Bypass Surgery for treating various heart conditions including Myocardial Infarction, Angina, Valve Disease, and Cardiac Arrhythmias.  Written material given at graduation.   Medication Safety: - Group verbal and visual instruction to review commonly prescribed medications for heart and lung disease. Reviews the medication, class of the drug, and side effects. Includes the steps to properly store meds and maintain the prescription regimen.  Written material given at graduation.   Other: -Provides group and verbal instruction on various topics (see comments)   Knowledge Questionnaire Score:  Knowledge Questionnaire Score - 03/25/21 1709       Knowledge Questionnaire Score   Pre Score 15/18              Core Components/Risk Factors/Patient Goals at Admission:  Personal Goals and Risk Factors at Admission - 03/25/21 1705       Core Components/Risk Factors/Patient Goals on  Admission    Weight Management Yes;Obesity;Weight Loss    Intervention Weight Management: Develop a combined nutrition and exercise program designed to reach desired caloric intake, while maintaining appropriate intake of nutrient and fiber, sodium and fats, and appropriate energy expenditure required for the weight goal.;Weight Management: Provide education and appropriate resources to help participant work on and attain dietary goals.;Weight Management/Obesity: Establish reasonable short term and long term weight goals.;Obesity: Provide education and appropriate resources to help participant work on and attain dietary goals.    Admit Weight 288 lb 1.6 oz (130.7 kg)    Goal Weight: Short Term 275 lb (124.7 kg)    Goal Weight: Long Term 260 lb (117.9 kg)    Expected Outcomes Short Term: Continue to assess and modify interventions until short term weight is achieved;Long Term: Adherence to nutrition and physical activity/exercise program aimed toward attainment of established weight goal;Weight Loss: Understanding of general recommendations for a balanced deficit meal plan, which promotes 1-2 lb weight loss per week and includes a negative energy balance of [phone] kcal/d;Understanding recommendations for meals to include 15-35% energy as protein, 25-35% energy from fat, 35-60% energy from carbohydrates, less than 280m of dietary cholesterol, 20-35 gm of total fiber daily;Understanding of distribution of calorie intake throughout the day with the consumption of 4-5 meals/snacks  Improve shortness of breath with ADL's Yes    Intervention Provide education, individualized exercise plan and daily activity instruction to help decrease symptoms of SOB with activities of daily living.    Expected Outcomes Short Term: Improve cardiorespiratory fitness to achieve a reduction of symptoms when performing ADLs;Long Term: Be able to perform more ADLs without symptoms or delay the onset of symptoms    Increase  knowledge of respiratory medications and ability to use respiratory devices properly  Yes    Intervention Provide education and demonstration as needed of appropriate use of medications, inhalers, and oxygen therapy.    Expected Outcomes Short Term: Achieves understanding of medications use. Understands that oxygen is a medication prescribed by physician. Demonstrates appropriate use of inhaler and oxygen therapy.;Long Term: Maintain appropriate use of medications, inhalers, and oxygen therapy.    Hypertension Yes    Intervention Provide education on lifestyle modifcations including regular physical activity/exercise, weight management, moderate sodium restriction and increased consumption of fresh fruit, vegetables, and low fat dairy, alcohol moderation, and smoking cessation.;Monitor prescription use compliance.    Expected Outcomes Short Term: Continued assessment and intervention until BP is < 140/31m HG in hypertensive participants. < 130/827mHG in hypertensive participants with diabetes, heart failure or chronic kidney disease.;Long Term: Maintenance of blood pressure at goal levels.    Lipids Yes    Intervention Provide education and support for participant on nutrition & aerobic/resistive exercise along with prescribed medications to achieve LDL <7015mHDL >34m39m  Expected Outcomes Short Term: Participant states understanding of desired cholesterol values and is compliant with medications prescribed. Participant is following exercise prescription and nutrition guidelines.;Long Term: Cholesterol controlled with medications as prescribed, with individualized exercise RX and with personalized nutrition plan. Value goals: LDL < 70mg83mL > 40 mg.             Education:Diabetes - Individual verbal and written instruction to review signs/symptoms of diabetes, desired ranges of glucose level fasting, after meals and with exercise. Acknowledge that pre and post exercise glucose checks will be  done for 3 sessions at entry of program.   Know Your Numbers and Heart Failure: - Group verbal and visual instruction to discuss disease risk factors for cardiac and pulmonary disease and treatment options.  Reviews associated critical values for Overweight/Obesity, Hypertension, Cholesterol, and Diabetes.  Discusses basics of heart failure: signs/symptoms and treatments.  Introduces Heart Failure Zone chart for action plan for heart failure.  Written material given at graduation.   Core Components/Risk Factors/Patient Goals Review:   Goals and Risk Factor Review     Row Name 04/22/21 1345             Core Components/Risk Factors/Patient Goals Review   Personal Goals Review Weight Management/Obesity;Hypertension       Review [place]'s weight is staying steady.  She meets with RD soon.  She does check BP at home every day.  She says it stays steady at 120/62.  We discussed getting a pulse oximeter to check oxygen at home.  She can walk further than before without getting short of breath.       Expected Outcomes Short:  get pulse oximeter for home Long: continue to manage risk factors                Core Components/Risk Factors/Patient Goals at Discharge (Final Review):   Goals and Risk Factor Review - 04/22/21 1345       Core Components/Risk Factors/Patient Goals Review   Personal  Goals Review Weight Management/Obesity;Hypertension    Review [place]'s weight is staying steady.  She meets with RD soon.  She does check BP at home every day.  She says it stays steady at 120/62.  We discussed getting a pulse oximeter to check oxygen at home.  She can walk further than before without getting short of breath.    Expected Outcomes Short:  get pulse oximeter for home Long: continue to manage risk factors             ITP Comments:  ITP Comments     Row Name 02/25/21 1546 03/25/21 1654 03/27/21 1330 04/10/21 0914 04/15/21 1350   ITP Comments Initial telephone orientation completed.  Diagnosis can be found in [facility] 11/29. EP Orientation scheduled for Monday 1/9 at 3pm. Completed 6MWT and gym orientation. Initial ITP created and sent for review to Dr. Zetta Bills, Medical Director. First full day of exercise!  Patient was oriented to gym and equipment including functions, settings, policies, and procedures.  Patient's individual exercise prescription and treatment plan were reviewed.  All starting workloads were established based on the results of the 6 minute walk test done at initial orientation visit.  The plan for exercise progression was also introduced and progression will be customized based on patient's performance and goals. 30 Day review completed. Medical Director ITP review done, changes made as directed, and signed approval by Medical Director.   new to program Completed initial RD consultation    [place] Name 05/08/21 0909           ITP Comments 30 Day review completed. Medical Director ITP review done, changes made as directed, and signed approval by Medical Director.                Comments:

## 2021-05-08 NOTE — Progress Notes (Signed)
Daily Session Note  Patient Details  Name: Paige Hester MRN: [id number] Date of Birth: 05-16-1955 Referring Provider:   Flowsheet Row Pulmonary Rehab from 03/25/2021 in [facility] Cardiac and Pulmonary Rehab  Referring Provider Dr. Chesley Mires       Encounter Date: 05/08/2021  Check In:  Session Check In - 05/08/21 1344       Check-In   Supervising physician immediately available to respond to emergencies See telemetry face sheet for immediately available ER MD    Location [facility]-Cardiac & Pulmonary Rehab    Staff Present Birdie Sons, MPA, Nino Glow, MS, ASCM CEP, Exercise Physiologist;Joseph Tessie Fass, [place]    Virtual Visit No    Medication changes reported     No    Fall or balance concerns reported    No    Tobacco Cessation No Change    Warm-up and Cool-down Performed on first and last piece of equipment    Resistance Training Performed Yes    VAD Patient? No    PAD/SET Patient? No      Pain Assessment   Currently in Pain? No/denies                Social History   Tobacco Use  Smoking Status Former   Packs/day: 1.00   Years: 27.00   Pack years: 27.00   Types: Cigarettes   Quit date: 1999   Years since quitting: 24.1  Smokeless Tobacco Never    Goals Met:  Independence with exercise equipment Exercise tolerated well No report of concerns or symptoms today Strength training completed today  Goals Unmet:  Not Applicable  Comments: Pt able to follow exercise prescription today without complaint.  Will continue to monitor for progression.    Dr. Emily Filbert is Medical Director for [place].  Dr. Ottie Glazier is Medical Director for [facility].

## 2021-05-13 ENCOUNTER — Other Ambulatory Visit: Payer: Self-pay

## 2021-05-13 DIAGNOSIS — R0600 Dyspnea, unspecified: Secondary | ICD-10-CM | POA: Diagnosis not present

## 2021-05-13 NOTE — Progress Notes (Signed)
Daily Session Note  Patient Details  Name: Paige Hester MRN: [id number] Date of Birth: 05/30/1955 Referring Provider:   Flowsheet Row Pulmonary Rehab from 03/25/2021 in [facility] Cardiac and Pulmonary Rehab  Referring Provider Dr. Chesley Mires       Encounter Date: 05/13/2021  Check In:  Session Check In - 05/13/21 1333       Check-In   Supervising physician immediately available to respond to emergencies See telemetry face sheet for immediately available ER MD    Location [facility]-Cardiac & Pulmonary Rehab    Staff Present Birdie Sons, MPA, Nino Glow, MS, ASCM CEP, Exercise Physiologist;Joseph Tessie Fass, [place]    Virtual Visit No    Medication changes reported     No    Fall or balance concerns reported    No    Tobacco Cessation No Change    Warm-up and Cool-down Performed on first and last piece of equipment    Resistance Training Performed Yes    VAD Patient? No    PAD/SET Patient? No      Pain Assessment   Currently in Pain? No/denies                Social History   Tobacco Use  Smoking Status Former   Packs/day: 1.00   Years: 27.00   Pack years: 27.00   Types: Cigarettes   Quit date: 1999   Years since quitting: 24.1  Smokeless Tobacco Never    Goals Met:  Independence with exercise equipment Exercise tolerated well No report of concerns or symptoms today Strength training completed today  Goals Unmet:  Not Applicable  Comments: Pt able to follow exercise prescription today without complaint.  Will continue to monitor for progression.    Dr. Emily Filbert is Medical Director for [place].  Dr. Ottie Glazier is Medical Director for [facility] Pulmonary Rehabilitation.

## 2021-05-14 ENCOUNTER — Other Ambulatory Visit: Payer: Self-pay

## 2021-05-14 ENCOUNTER — Emergency Department (HOSPITAL_COMMUNITY)
Admission: EM | Admit: 2021-05-14 | Discharge: 2021-05-14 | Disposition: A | Discharge status: Home / Self Care | Payer: Medicare Other | Attending: Emergency Medicine | Admitting: Emergency Medicine

## 2021-05-14 ENCOUNTER — Encounter (INDEPENDENT_AMBULATORY_CARE_PROVIDER_SITE_OTHER): Payer: Self-pay

## 2021-05-14 ENCOUNTER — Emergency Department (HOSPITAL_COMMUNITY): Payer: Medicare Other

## 2021-05-14 ENCOUNTER — Ambulatory Visit (INDEPENDENT_AMBULATORY_CARE_PROVIDER_SITE_OTHER): Payer: Federal, State, Local not specified - PPO | Admitting: Adult Health

## 2021-05-14 DIAGNOSIS — Y92410 Unspecified street and highway as the place of occurrence of the external cause: Secondary | ICD-10-CM | POA: Insufficient documentation

## 2021-05-14 DIAGNOSIS — M25561 Pain in right knee: Secondary | ICD-10-CM | POA: Diagnosis not present

## 2021-05-14 DIAGNOSIS — S80212A Abrasion, left knee, initial encounter: Secondary | ICD-10-CM | POA: Insufficient documentation

## 2021-05-14 DIAGNOSIS — Z79899 Other long term (current) drug therapy: Secondary | ICD-10-CM | POA: Diagnosis not present

## 2021-05-14 DIAGNOSIS — S51811A Laceration without foreign body of right forearm, initial encounter: Secondary | ICD-10-CM | POA: Diagnosis not present

## 2021-05-14 DIAGNOSIS — I10 Essential (primary) hypertension: Secondary | ICD-10-CM | POA: Insufficient documentation

## 2021-05-14 DIAGNOSIS — R7303 Prediabetes: Secondary | ICD-10-CM | POA: Insufficient documentation

## 2021-05-14 DIAGNOSIS — R0789 Other chest pain: Secondary | ICD-10-CM

## 2021-05-14 DIAGNOSIS — M542 Cervicalgia: Secondary | ICD-10-CM | POA: Insufficient documentation

## 2021-05-14 DIAGNOSIS — R200 Anesthesia of skin: Secondary | ICD-10-CM | POA: Insufficient documentation

## 2021-05-14 DIAGNOSIS — S60221A Contusion of right hand, initial encounter: Secondary | ICD-10-CM | POA: Insufficient documentation

## 2021-05-14 DIAGNOSIS — M25562 Pain in left knee: Secondary | ICD-10-CM

## 2021-05-14 DIAGNOSIS — R519 Headache, unspecified: Secondary | ICD-10-CM | POA: Insufficient documentation

## 2021-05-14 DIAGNOSIS — S20212A Contusion of left front wall of thorax, initial encounter: Secondary | ICD-10-CM | POA: Insufficient documentation

## 2021-05-14 DIAGNOSIS — Z794 Long term (current) use of insulin: Secondary | ICD-10-CM | POA: Diagnosis not present

## 2021-05-14 DIAGNOSIS — S59911A Unspecified injury of right forearm, initial encounter: Secondary | ICD-10-CM | POA: Diagnosis present

## 2021-05-14 MED ORDER — METHOCARBAMOL 500 MG PO TABS: 500.0000 mg | ORAL_TABLET | Freq: Three times a day (TID) | ORAL | 0 refills | Status: DC | PRN

## 2021-05-14 MED ORDER — OXYCODONE-ACETAMINOPHEN 5-325 MG PO TABS
1.0000 | ORAL_TABLET | Freq: Once | ORAL | Status: AC
  Administered 2021-05-14: 1 via ORAL
  Filled 2021-05-14: qty 1

## 2021-05-14 MED ORDER — LIDOCAINE 5 % EX PTCH
1.0000 | MEDICATED_PATCH | CUTANEOUS | 0 refills | Status: DC
Start: 2021-05-14 — End: 2021-07-15
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
## 2021-05-15 ENCOUNTER — Telehealth: Payer: Self-pay
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
## 2021-05-17 ENCOUNTER — Ambulatory Visit: Payer: Medicare Other | Admitting: Family

## 2021-05-17 NOTE — Progress Notes (Deleted)
g

## 2021-05-20 ENCOUNTER — Telehealth: Payer: Self-pay | Admitting: Family

## 2021-05-20 ENCOUNTER — Ambulatory Visit: Payer: Medicare Other | Admitting: Family

## 2021-05-22 ENCOUNTER — Ambulatory Visit: Payer: Federal, State, Local not specified - PPO | Admitting: Family

## 2021-05-23 ENCOUNTER — Other Ambulatory Visit: Payer: Self-pay

## 2021-05-23 ENCOUNTER — Encounter: Payer: Self-pay | Admitting: Family Medicine

## 2021-05-23 ENCOUNTER — Ambulatory Visit (INDEPENDENT_AMBULATORY_CARE_PROVIDER_SITE_OTHER): Payer: Medicare Other | Admitting: Family Medicine

## 2021-05-23 DIAGNOSIS — M545 Low back pain, unspecified: Secondary | ICD-10-CM | POA: Diagnosis not present

## 2021-05-23 DIAGNOSIS — M25521 Pain in right elbow: Secondary | ICD-10-CM

## 2021-05-23 DIAGNOSIS — M25561 Pain in right knee: Secondary | ICD-10-CM

## 2021-05-23 DIAGNOSIS — S46812A Strain of other muscles, fascia and tendons at shoulder and upper arm level, left arm, initial encounter: Secondary | ICD-10-CM

## 2021-05-23 DIAGNOSIS — M79641 Pain in right hand: Secondary | ICD-10-CM

## 2021-05-23 DIAGNOSIS — M25562 Pain in left knee: Secondary | ICD-10-CM

## 2021-05-23 DIAGNOSIS — M25522 Pain in left elbow: Secondary | ICD-10-CM

## 2021-05-23 MED ORDER — METHOCARBAMOL 500 MG PO TABS: 500.0000 mg | ORAL_TABLET | Freq: Three times a day (TID) | ORAL | 1 refills | Status: DC | PRN
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
## 2021-05-28 ENCOUNTER — Telehealth: Payer: Self-pay

## 2021-05-28 ENCOUNTER — Encounter: Payer: Self-pay | Admitting: Obstetrics & Gynecology

## 2021-05-28 ENCOUNTER — Encounter: Payer: Self-pay | Admitting: *Deleted

## 2021-05-28 ENCOUNTER — Ambulatory Visit (INDEPENDENT_AMBULATORY_CARE_PROVIDER_SITE_OTHER): Payer: Medicare Other | Admitting: Obstetrics & Gynecology

## 2021-05-28 ENCOUNTER — Encounter: Payer: Self-pay | Admitting: Family Medicine

## 2021-05-28 ENCOUNTER — Other Ambulatory Visit: Payer: Self-pay

## 2021-05-28 VITALS — BP 125/74 | HR 85 | Wt 277.4 lb

## 2021-05-28 DIAGNOSIS — N950 Postmenopausal bleeding: Secondary | ICD-10-CM

## 2021-05-28 DIAGNOSIS — R9389 Abnormal findings on diagnostic imaging of other specified body structures: Secondary | ICD-10-CM

## 2021-05-28 NOTE — H&P (View-Only) (Signed)
? ?GYNECOLOGY OFFICE VISIT NOTE ? ?History:  ? Paige Hester is a 66 y.o. [id number] here today for evaluation of recurrent postmenopausal bleeding and persistently thickened endometrial stripe.  She reports no further bleeding since last visit, had declined oral progestin therapy then.   She denies any abnormal vaginal discharge, bleeding, pelvic pain or other concerns.  ?  ?Past Medical History:  ?Diagnosis Date  ? Allergy   ? Bilateral swelling of feet and ankles   ? Gallbladder polyp 2012  ? GERD (gastroesophageal reflux disease)   ? Hyperlipidemia   ? Hypertension   ? Iron deficiency anemia   ? Menopausal symptoms   ? since 2007  ? Murmur, cardiac   ? Other fatigue   ? Prediabetes   ? Pulmonary embolism ([place]) 06/2020  ? Shortness of breath   ? Shortness of breath on exertion   ? Vitamin D deficiency   ? ? ?Past Surgical History:  ?Procedure Laterality Date  ? COLONOSCOPY  2010  ? 2020  ? HYSTEROSCOPY WITH D & C N/A 07/27/2019  ? Procedure: DILATATION AND CURETTAGE /HYSTEROSCOPY, Polypectomy;  Surgeon: Osborne Oman, MD;  Location: [facility];  Service: Gynecology;  Laterality: N/A;  ? ? ?The following portions of the patient's history were reviewed and updated as appropriate: allergies, current medications, past family history, past medical history, past social history, past surgical history and problem list.  ? ?Health Maintenance:  Normal pap and negative HRHPV on 04/24/2021.  Normal mammogram on 02/19/2021.  ? ?Review of Systems:  ?Pertinent items noted in HPI and remainder of comprehensive ROS otherwise negative. ? ?Physical Exam:  ?BP 125/74   Pulse 85   Wt 277 lb 6.4 oz (125.8 kg)   BMI 46.16 kg/m?  ?CONSTITUTIONAL: Well-developed, well-nourished female in no acute distress.  ?HEENT:  Normocephalic, atraumatic. External right and left ear normal. No scleral icterus.  ?NECK: Normal range of motion, supple, no masses noted on observation ?SKIN: No rash noted. Not diaphoretic. No  erythema. No pallor. ?MUSCULOSKELETAL: Normal range of motion. No edema noted. ?NEUROLOGIC: Alert and oriented to person, place, and time. Normal muscle tone coordination. No cranial nerve deficit noted. ?PSYCHIATRIC: Normal mood and affect. Normal behavior. Normal judgment and thought content. ?CARDIOVASCULAR: Normal heart rate noted ?RESPIRATORY: Effort and breath sounds normal, no problems with respiration noted ?ABDOMEN: No masses noted. No other overt distention noted.   ?PELVIC: Deferred ? ?Labs and Imaging ?Result Date: 05/03/2021 ?CLINICAL DATA:  Postmenopausal bleeding since January 2023, had a polyp removed in 2020 EXAM: [place]: Both transabdominal and transvaginal ultrasound examinations of the pelvis were performed. Transabdominal technique was performed for global imaging of the pelvis including uterus, ovaries, adnexal regions, and pelvic cul-de-sac. It was necessary to proceed with endovaginal exam following the transabdominal exam to visualize the uterus, endometrium, and ovaries. COMPARISON:  07/06/2019 FINDINGS: Uterus Measurements: 6.5 x 3.9 x 5.1 cm = volume: 68 mL. Anteverted. Heterogeneous myometrium. No focal mass. Endometrium Thickness: 16 mm.  No endometrial fluid or mass Right ovary Not visualized, likely obscured by bowel Left ovary Not visualized, likely obscured by bowel Other findings No free pelvic fluid.  No adnexal masses. IMPRESSION: Nonvisualization of ovaries. Abnormal thickened endometrial complex 16 mm thick; in the setting of post-menopausal bleeding, endometrial sampling is indicated to exclude carcinoma. If results are benign, sonohysterogram should be considered for focal lesion work-up. (Ref: Radiological Reasoning: Algorithmic Workup of Abnormal Vaginal Bleeding with Endovaginal Sonography and Sonohysterography.  AJR 2008; [id number]) These results will be called to the ordering clinician or representative by the  Radiologist Assistant, and communication documented in the PACS or [employer]. Electronically Signed   By: Lavonia Dana M.D.   On: 05/03/2021 15:01      ?Assessment and Plan:  ?   ?1. Recurrent postmenopausal bleeding ?2. Endometrial thickness of 18 mm and associated bleeding in postmenopausal patient ?Endometrial biopsy recommended today, patient declined this and desires Hysteroscopy, Dilation and Curettage. She had this procedure before in 2021. Reports that office endometrial biopsy is too painful for her to undergo, and she is also interested in getting progestin IUD placed; wants all this done under anesthesia. ? ?Procedure: Hysteroscopy, Dilation and Curettage, Mirena intrauterine placement ?Indications: Recurrent postmenopausal bleeding, thickened endometrium, declines office endometrial biopsy and IUD placement ? ?The risks of procedure were discussed in detail with the patient including but not limited to: bleeding; infection which may require antibiotics; injury to uterus or surrounding organs which may involve bowel, bladder; irregular bleeding and cramping after IUD insertion; malpositioning, expulsion or misplacement of the IUD outside the uterus; need for additional procedures including laparoscopy or laparotomy; thromboembolic phenomenon, surgical site problems and other postoperative/anesthesia complications. Likelihood of success in alleviating the patient's condition was discussed. All questions answered.  She was told that she will be contacted by our surgical scheduler regarding the time and date of her surgery; routine preoperative instructions will be given to her by the preoperative nursing team. Preoperative orders signed and held.  Printed patient education handouts about the procedure were given to the patient to review at home. ?  ?Please refer to After Visit Summary for other counseling recommendations.  ? ?Return for any gynecologic concerns.   ? ?I spent 30 minutes dedicated to  the care of this patient including pre-visit review of records, face to face time with the patient discussing her conditions and treatments and post visit orders. ? ? ? ?Verita Schneiders, MD, FACOG ?Obstetrician Social research officer, government, Faculty Practice ?[facility] [place] ?

## 2021-05-28 NOTE — Telephone Encounter (Signed)
Called and LVM in regards to upcoming appt. Would like if patient could bring in SD card from CPAP machine.  ?

## 2021-05-28 NOTE — Progress Notes (Signed)
? ?GYNECOLOGY OFFICE VISIT NOTE ? ?History:  ? Paige Hester is a 66 y.o. [id number] here today for evaluation of recurrent postmenopausal bleeding and persistently thickened endometrial stripe.  She reports no further bleeding since last visit, had declined oral progestin therapy then.   She denies any abnormal vaginal discharge, bleeding, pelvic pain or other concerns.  ?  ?Past Medical History:  ?Diagnosis Date  ? Allergy   ? Bilateral swelling of feet and ankles   ? Gallbladder polyp 2012  ? GERD (gastroesophageal reflux disease)   ? Hyperlipidemia   ? Hypertension   ? Iron deficiency anemia   ? Menopausal symptoms   ? since 2007  ? Murmur, cardiac   ? Other fatigue   ? Prediabetes   ? Pulmonary embolism ([place]) 06/2020  ? Shortness of breath   ? Shortness of breath on exertion   ? Vitamin D deficiency   ? ? ?Past Surgical History:  ?Procedure Laterality Date  ? COLONOSCOPY  2010  ? 2020  ? HYSTEROSCOPY WITH D & C N/A 07/27/2019  ? Procedure: DILATATION AND CURETTAGE /HYSTEROSCOPY, Polypectomy;  Surgeon: Osborne Oman, MD;  Location: [facility];  Service: Gynecology;  Laterality: N/A;  ? ? ?The following portions of the patient's history were reviewed and updated as appropriate: allergies, current medications, past family history, past medical history, past social history, past surgical history and problem list.  ? ?Health Maintenance:  Normal pap and negative HRHPV on 04/24/2021.  Normal mammogram on 02/19/2021.  ? ?Review of Systems:  ?Pertinent items noted in HPI and remainder of comprehensive ROS otherwise negative. ? ?Physical Exam:  ?BP 125/74   Pulse 85   Wt 277 lb 6.4 oz (125.8 kg)   BMI 46.16 kg/m?  ?CONSTITUTIONAL: Well-developed, well-nourished female in no acute distress.  ?HEENT:  Normocephalic, atraumatic. External right and left ear normal. No scleral icterus.  ?NECK: Normal range of motion, supple, no masses noted on observation ?SKIN: No rash noted. Not diaphoretic. No  erythema. No pallor. ?MUSCULOSKELETAL: Normal range of motion. No edema noted. ?NEUROLOGIC: Alert and oriented to person, place, and time. Normal muscle tone coordination. No cranial nerve deficit noted. ?PSYCHIATRIC: Normal mood and affect. Normal behavior. Normal judgment and thought content. ?CARDIOVASCULAR: Normal heart rate noted ?RESPIRATORY: Effort and breath sounds normal, no problems with respiration noted ?ABDOMEN: No masses noted. No other overt distention noted.   ?PELVIC: Deferred ? ?Labs and Imaging ?Result Date: 05/03/2021 ?CLINICAL DATA:  Postmenopausal bleeding since January 2023, had a polyp removed in 2020 EXAM: [place]: Both transabdominal and transvaginal ultrasound examinations of the pelvis were performed. Transabdominal technique was performed for global imaging of the pelvis including uterus, ovaries, adnexal regions, and pelvic cul-de-sac. It was necessary to proceed with endovaginal exam following the transabdominal exam to visualize the uterus, endometrium, and ovaries. COMPARISON:  07/06/2019 FINDINGS: Uterus Measurements: 6.5 x 3.9 x 5.1 cm = volume: 68 mL. Anteverted. Heterogeneous myometrium. No focal mass. Endometrium Thickness: 16 mm.  No endometrial fluid or mass Right ovary Not visualized, likely obscured by bowel Left ovary Not visualized, likely obscured by bowel Other findings No free pelvic fluid.  No adnexal masses. IMPRESSION: Nonvisualization of ovaries. Abnormal thickened endometrial complex 16 mm thick; in the setting of post-menopausal bleeding, endometrial sampling is indicated to exclude carcinoma. If results are benign, sonohysterogram should be considered for focal lesion work-up. (Ref: Radiological Reasoning: Algorithmic Workup of Abnormal Vaginal Bleeding with Endovaginal Sonography and Sonohysterography.  AJR 2008; [id number]) These results will be called to the ordering clinician or representative by the  Radiologist Assistant, and communication documented in the PACS or [employer]. Electronically Signed   By: Lavonia Dana M.D.   On: 05/03/2021 15:01      ?Assessment and Plan:  ?   ?1. Recurrent postmenopausal bleeding ?2. Endometrial thickness of 18 mm and associated bleeding in postmenopausal patient ?Endometrial biopsy recommended today, patient declined this and desires Hysteroscopy, Dilation and Curettage. She had this procedure before in 2021. Reports that office endometrial biopsy is too painful for her to undergo, and she is also interested in getting progestin IUD placed; wants all this done under anesthesia. ? ?Procedure: Hysteroscopy, Dilation and Curettage, Mirena intrauterine placement ?Indications: Recurrent postmenopausal bleeding, thickened endometrium, declines office endometrial biopsy and IUD placement ? ?The risks of procedure were discussed in detail with the patient including but not limited to: bleeding; infection which may require antibiotics; injury to uterus or surrounding organs which may involve bowel, bladder; irregular bleeding and cramping after IUD insertion; malpositioning, expulsion or misplacement of the IUD outside the uterus; need for additional procedures including laparoscopy or laparotomy; thromboembolic phenomenon, surgical site problems and other postoperative/anesthesia complications. Likelihood of success in alleviating the patient's condition was discussed. All questions answered.  She was told that she will be contacted by our surgical scheduler regarding the time and date of her surgery; routine preoperative instructions will be given to her by the preoperative nursing team. Preoperative orders signed and held.  Printed patient education handouts about the procedure were given to the patient to review at home. ?  ?Please refer to After Visit Summary for other counseling recommendations.  ? ?Return for any gynecologic concerns.   ? ?I spent 30 minutes dedicated to  the care of this patient including pre-visit review of records, face to face time with the patient discussing her conditions and treatments and post visit orders. ? ? ? ?Verita Schneiders, MD, FACOG ?Obstetrician Social research officer, government, Faculty Practice ?[facility] [place] ?

## 2021-05-29 ENCOUNTER — Encounter: Payer: Self-pay | Admitting: Primary Care

## 2021-05-29 ENCOUNTER — Encounter: Payer: Medicare Other | Attending: Pulmonary Disease

## 2021-05-29 ENCOUNTER — Ambulatory Visit (INDEPENDENT_AMBULATORY_CARE_PROVIDER_SITE_OTHER): Payer: Medicare Other | Admitting: Primary Care

## 2021-05-29 VITALS — BP 130/60 | HR 82 | Temp 97.3°F | Ht 65.0 in | Wt 278.4 lb

## 2021-05-29 DIAGNOSIS — R0600 Dyspnea, unspecified: Secondary | ICD-10-CM | POA: Insufficient documentation

## 2021-05-29 DIAGNOSIS — G4733 Obstructive sleep apnea (adult) (pediatric): Secondary | ICD-10-CM
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
## 2021-05-29 NOTE — Patient Instructions (Addendum)
Recommendations: ?Resume CPAP use, aim to wear every night for min 4 or more hours ?Discuss leg swelling with PCP, could be from amlodipine dose  ? ?Orders: ?Mask fitting with DME  ?Adjust CPAP pressure 5-15cm h20  ? ?Follow-up: ?6-8 week virtual visit with Eustaquio Maize NP  ?

## 2021-05-29 NOTE — Progress Notes (Signed)
Reviewed and agree with assessment/plan. ? ? ?Chesley Mires, MD ?[place] ?05/29/2021, 2:34 PM ?Pager:  [phone] ? ?
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
## 2021-05-29 NOTE — Progress Notes (Signed)
Daily Session Note ? ?Patient Details  ?Name: Paige Hester ?MRN: [id number] ?Date of Birth: January 10, 1956 ?Referring Provider:   ?Flowsheet Row Pulmonary Rehab from 03/25/2021 in [facility] Cardiac and Pulmonary Rehab  ?Referring Provider Dr. Chesley Mires  ? ?  ? ? ?Encounter Date: 05/29/2021 ? ?Check In: ? Session Check In - 05/29/21 1332   ? ?  ? Check-In  ? Supervising physician immediately available to respond to emergencies See telemetry face sheet for immediately available ER MD   ? Location [facility]-Cardiac & Pulmonary Rehab   ? Staff Present Birdie Sons, MPA, RN;Joseph [place], Lorre Nick, MA, RCEP, CCRP, CCET;Laureen Orick, [place], RRT, CPFT   ? Virtual Visit No   ? Medication changes reported     Yes   ? Comments added muscle relaxer (methocarbamol)   ? Fall or balance concerns reported    No   ? Tobacco Cessation No Change   ? Warm-up and Cool-down Performed on first and last piece of equipment   ? Resistance Training Performed Yes   ? VAD Patient? No   ? PAD/SET Patient? No   ?  ? Pain Assessment  ? Currently in Pain? No/denies   ? ?  ?  ? ?  ? ? ? ? ? ?Social History  ? ?Tobacco Use  ?Smoking Status Former  ? Packs/day: 1.00  ? Years: 27.00  ? Pack years: 27.00  ? Types: Cigarettes  ? Quit date: 1999  ? Years since quitting: 24.2  ?Smokeless Tobacco Never  ? ? ?Goals Met:  ?Independence with exercise equipment ?Exercise tolerated well ?No report of concerns or symptoms today ?Strength training completed today ? ?Goals Unmet:  ?Not Applicable ? ?Comments: Pt able to follow exercise prescription today without complaint.  Will continue to monitor for progression. ? ? ? ?Dr. Emily Filbert is Medical Director for [place] [facility].  ?Dr. Ottie Glazier is Medical Director for [facility] Pulmonary Rehabilitation. ?

## 2021-06-03 ENCOUNTER — Other Ambulatory Visit: Payer: Self-pay | Admitting: Primary Care

## 2021-06-03 DIAGNOSIS — I10 Essential (primary) hypertension: Secondary | ICD-10-CM

## 2021-06-05 ENCOUNTER — Encounter: Payer: Self-pay | Admitting: *Deleted

## 2021-06-05 DIAGNOSIS — R0600 Dyspnea, unspecified: Secondary | ICD-10-CM

## 2021-06-05 NOTE — Progress Notes (Signed)
Pulmonary Individual Treatment Plan ? ?Patient Details  ?Name: Paige Hester ?MRN: [id number] ?Date of Birth: 1955-05-19 ?Referring Provider:   ?Flowsheet Row Pulmonary Rehab from 03/25/2021 in [facility] Cardiac and Pulmonary Rehab  ?Referring Provider Dr. Chesley Mires  ? ?  ? ? ?Initial Encounter Date:  ?Flowsheet Row Pulmonary Rehab from 03/25/2021 in [facility] Cardiac and Pulmonary Rehab  ?Date 03/25/21  ? ?  ? ? ?Visit Diagnosis: Dyspnea, unspecified type ? ?Patient's Home Medications on Admission: ? ?Current Outpatient Medications:  ?  albuterol (VENTOLIN HFA) 108 (90 Base) MCG/ACT inhaler, Inhale 2 puffs into the lungs every 6 (six) hours as needed for wheezing or shortness of breath., Disp: 8 g, Rfl: 2 ?  amLODipine (NORVASC) 10 MG tablet, Take 1 tablet by mouth once daily for blood pressure, Disp: 90 tablet, Rfl: 1 ?  Cholecalciferol (VITAMIN D3) 1000 UNITS tablet, Take 1,000 Units by mouth daily., Disp: , Rfl:  ?  docusate sodium (COLACE) 100 MG capsule, Take 1 capsule (100 mg total) by mouth 2 (two) times daily as needed for mild constipation or moderate constipation., Disp: 30 capsule, Rfl: 2 ?  fluticasone (FLONASE) 50 MCG/ACT nasal spray, Place 1 spray into both nostrils 2 (two) times daily as needed for allergies or rhinitis., Disp: 16 g, Rfl: 0 ?  lidocaine (LIDODERM) 5 %, Place 1 patch onto the skin daily. Remove & Discard patch within 12 hours or as directed by MD, Disp: 30 patch, Rfl: 0 ?  loratadine (CLARITIN) 10 MG tablet, Take 10 mg by mouth daily., Disp: , Rfl:  ?  losartan-hydrochlorothiazide (HYZAAR) 100-25 MG tablet, Take 1 tablet by mouth once daily for blood pressure, Disp: 90 tablet, Rfl: 1 ?  Melatonin 10 MG TABS, Take by mouth as needed., Disp: , Rfl:  ?  methocarbamol (ROBAXIN) 500 MG tablet, Take 1 tablet (500 mg total) by mouth every 8 (eight) hours as needed for muscle spasms., Disp: 60 tablet, Rfl: 1 ?  omeprazole (PRILOSEC) 20 MG capsule, Take 20 mg by mouth every other day., Disp: , Rfl:  ?   Potassium 99 MG TABS, Take 1 tablet by mouth in the morning and at bedtime., Disp: , Rfl:  ?  rosuvastatin (CRESTOR) 20 MG tablet, Take 1 tablet (20 mg total) by mouth daily., Disp: 90 tablet, Rfl: 3 ?  Semaglutide,0.25 or 0.'5MG'$ /DOS, (OZEMPIC, 0.25 OR 0.5 MG/DOSE,) 2 MG/1.5ML SOPN, Inject 0.25 mg into the skin once a week., Disp: 1.5 mL, Rfl: 0 ? ?Past Medical History: ?Past Medical History:  ?Diagnosis Date  ? Allergy   ? Bilateral swelling of feet and ankles   ? Gallbladder polyp 2012  ? GERD (gastroesophageal reflux disease)   ? Hyperlipidemia   ? Hypertension   ? Iron deficiency anemia   ? Menopausal symptoms   ? since 2007  ? Murmur, cardiac   ? Other fatigue   ? Prediabetes   ? Pulmonary embolism ([place]) 06/2020  ? Shortness of breath   ? Shortness of breath on exertion   ? Vitamin D deficiency   ? ? ?Tobacco Use: ?Social History  ? ?Tobacco Use  ?Smoking Status Former  ? Packs/day: 1.00  ? Years: 27.00  ? Pack years: 27.00  ? Types: Cigarettes  ? Quit date: 1999  ? Years since quitting: 24.2  ?Smokeless Tobacco Never  ? ? ?Labs: ?Review Flowsheet   ? ?  ?  Latest Ref Rng & Units 03/03/2019 11/18/2019 10/18/2020 01/22/2021  ?Labs for [facility] Cardiac and Pulmonary Rehab  ?  Cholestrol 0 - 200 mg/dL  158   108   130    ?LDL (calc) 0 - 99 mg/dL  105   54   58    ?HDL-C >39.00 mg/dL  42.10   45.60   61    ?Trlycerides 0.0 - 149.0 mg/dL  52.0   43.0   44    ?Hemoglobin A1c 4.8 - 5.6 % 5.9   6.0   6.1   5.6    ? ?  02/12/2021  ?Labs for [facility] Cardiac and Pulmonary Rehab  ?Cholestrol 114    ?LDL (calc) 47    ?HDL-C 57.00    ?Trlycerides 46.0    ?Hemoglobin A1c   ?  ? ? Multiple values from one day are sorted in reverse-chronological order  ?  ?  ? ? ? ?Pulmonary Assessment Scores: ? Pulmonary Assessment Scores   ? ? [place] Name 03/25/21 1710  ?  ?  ?  ? ADL UCSD  ? SOB Score total 72    ? Rest 0    ? Walk 4    ? Stairs 5    ? Bath 2    ? Dress 3    ? Shop 4    ?  ? CAT Score  ? CAT Score 19    ?  ? mMRC Score  ? mMRC Score 3    ? ?   ?  ? ?  ?  ?UCSD: ?Self-administered rating of dyspnea associated with activities of daily living (ADLs) ?6-point scale (0 = "not at all" to 5 = "maximal or unable to do because of breathlessness")  ?Scoring Scores range from 0 to 120.  Minimally important difference is 5 units ? ?CAT: ?CAT can identify the health impairment of COPD patients and is better correlated with disease progression.  ?CAT has a scoring range of zero to 40. The CAT score is classified into four groups of low (less than 10), medium (10 - 20), high (21-30) and very high (31-40) based on the impact level of disease on health status. A CAT score over 10 suggests significant symptoms.  A worsening CAT score could be explained by an exacerbation, poor medication adherence, poor inhaler technique, or progression of COPD or comorbid conditions.  ?CAT MCID is 2 points ? ?mMRC: ?mMRC (Modified Medical Research Council) Dyspnea Scale is used to assess the degree of baseline functional disability in patients of respiratory disease due to dyspnea. ?No minimal important difference is established. A decrease in score of 1 point or greater is considered a positive change.  ? ?Pulmonary Function Assessment: ? ? ?Exercise Target Goals: ?Exercise Program Goal: ?Individual exercise prescription set using results from initial 6 min walk test and THRR while considering  patient?s activity barriers and safety.  ? ?Exercise Prescription Goal: ?Initial exercise prescription builds to 30-45 minutes a day of aerobic activity, 2-3 days per week.  Home exercise guidelines will be given to patient during program as part of exercise prescription that the participant will acknowledge. ? ?Education: Aerobic Exercise: ?- Group verbal and visual presentation on the components of exercise prescription. Introduces F.I.T.T principle from ACSM for exercise prescriptions.  Reviews F.I.T.T. principles of aerobic exercise including progression. Written material given at  graduation. ? ? ?Education: Resistance Exercise: ?- Group verbal and visual presentation on the components of exercise prescription. Introduces F.I.T.T principle from ACSM for exercise prescriptions  Reviews F.I.T.T. principles of resistance exercise including progression. Written material given at graduation. ? ?  ?Education: Exercise &  Equipment Safety: ?- Individual verbal instruction and demonstration of equipment use and safety with use of the equipment. ?Flowsheet Row Pulmonary Rehab from 05/29/2021 in [facility] Cardiac and Pulmonary Rehab  ?Date 03/25/21  ?Educator AS  ?Instruction Review Code 1- Verbalizes Understanding  ? ?  ? ? ?Education: Exercise Physiology & General Exercise Guidelines: ?- Group verbal and written instruction with models to review the exercise physiology of the cardiovascular system and associated critical values. Provides general exercise guidelines with specific guidelines to those with heart or lung disease.  ?Flowsheet Row Pulmonary Rehab from 05/29/2021 in [facility] Cardiac and Pulmonary Rehab  ?Date 05/08/21  ?Educator AS  ?Instruction Review Code 1- Verbalizes Understanding  ? ?  ? ? ?Education: Flexibility, Balance, Mind/Body Relaxation: ?- Group verbal and visual presentation with interactive activity on the components of exercise prescription. Introduces F.I.T.T principle from ACSM for exercise prescriptions. Reviews F.I.T.T. principles of flexibility and balance exercise training including progression. Also discusses the mind body connection.  Reviews various relaxation techniques to help reduce and manage stress (i.e. Deep breathing, progressive muscle relaxation, and visualization). Balance handout provided to take home. Written material given at graduation. ? ? ?Activity Barriers & Risk Stratification: ? Activity Barriers & Cardiac Risk Stratification - 02/25/21 1535   ? ?  ? Activity Barriers & Cardiac Risk Stratification  ? Activity Barriers Shortness of Breath   ? ?  ?  ? ?  ? ? ?6  Minute Walk: ? 6 Minute Walk   ? ? [place] Name 03/25/21 1654  ?  ?  ?  ? 6 Minute Walk  ? Phase Initial    ? Distance 865 feet    ? Walk Time 6 minutes    ? # of Rest Breaks 0    ? MPH 1.64    ? METS 1.94    ? RPE

## 2021-06-13 ENCOUNTER — Telehealth: Payer: Self-pay | Admitting: Primary Care

## 2021-06-13 NOTE — Telephone Encounter (Signed)
Pt called stating that she just left PT. Pt states that they advice her to purchase a [employer]. Pt is asking for a prescription for a Tens Machine. Please advise. ?

## 2021-06-14 NOTE — Telephone Encounter (Signed)
I will provide her with a paper prescription for the TENS unit as requested.  Placed in Paige Hester. ?

## 2021-06-17 ENCOUNTER — Encounter: Payer: Medicare Other | Attending: Pulmonary Disease

## 2021-06-17 ENCOUNTER — Encounter: Payer: Self-pay | Admitting: *Deleted

## 2021-06-17 DIAGNOSIS — R0600 Dyspnea, unspecified: Secondary | ICD-10-CM

## 2021-06-17 NOTE — Telephone Encounter (Signed)
Called and lvm for pt to call us back to let know if this need to fax or if she was going to pick this up  ?

## 2021-06-17 NOTE — Telephone Encounter (Signed)
Pt called back she wants to pick up order, order placed at the front desk for pick up  ?

## 2021-06-19 ENCOUNTER — Encounter (HOSPITAL_BASED_OUTPATIENT_CLINIC_OR_DEPARTMENT_OTHER): Payer: Self-pay | Admitting: Obstetrics & Gynecology

## 2021-06-19 ENCOUNTER — Other Ambulatory Visit: Payer: Self-pay

## 2021-06-19 DIAGNOSIS — R0600 Dyspnea, unspecified: Secondary | ICD-10-CM | POA: Diagnosis present

## 2021-06-19 NOTE — Progress Notes (Signed)
Daily Session Note ? ?Patient Details  ?Name: Paige Hester ?MRN: [id number] ?Date of Birth: 1955/07/23 ?Referring Provider:   ?Flowsheet Row Pulmonary Rehab from 03/25/2021 in [facility] Cardiac and Pulmonary Rehab  ?Referring Provider Dr. Chesley Mires  ? ?  ? ? ?Encounter Date: 06/19/2021 ? ?Check In: ? Session Check In - 06/19/21 1340   ? ?  ? Check-In  ? Supervising physician immediately available to respond to emergencies See telemetry face sheet for immediately available ER MD   ? Location [facility]-Cardiac & Pulmonary Rehab   ? Staff Present Birdie Sons, MPA, RN;Jessica Vidalia, MA, RCEP, CCRP, Marylynn Pearson, MS, ASCM CEP, Exercise Physiologist;Melissa [place], RDN, LDN   ? Virtual Visit No   ? Medication changes reported     No   ? Fall or balance concerns reported    No   ? Tobacco Cessation No Change   ? Warm-up and Cool-down Performed on first and last piece of equipment   ? Resistance Training Performed Yes   ? VAD Patient? No   ? PAD/SET Patient? No   ?  ? Pain Assessment  ? Currently in Pain? No/denies   ? ?  ?  ? ?  ? ? ? ? ? ?Social History  ? ?Tobacco Use  ?Smoking Status Former  ? Packs/day: 1.00  ? Years: 27.00  ? Pack years: 27.00  ? Types: Cigarettes  ? Quit date: 1999  ? Years since quitting: 24.2  ?Smokeless Tobacco Never  ? ? ?Goals Met:  ?Independence with exercise equipment ?Exercise tolerated well ?No report of concerns or symptoms today ?Strength training completed today ? ?Goals Unmet:  ?Not Applicable ? ?Comments: Pt able to follow exercise prescription today without complaint.  Will continue to monitor for progression. ? ? ? ?Dr. Emily Filbert is Medical Director for [place].  ?Dr. Ottie Glazier is Medical Director for [facility] Pulmonary Rehabilitation. ?

## 2021-06-19 NOTE — Progress Notes (Signed)
Spoke w/ via phone for pre-op interview: patient ?Lab needs dos:  ?Lab results: PAT appointment 06/24/21; CBC and BMP to be drawn; EKG 03/25/21 ?COVID test: patient states asymptomatic no test needed. ?Arrive at 1100 06/26/21 ?NPO after MN except clear liquids.Clear liquids from MN until 1000 ?Med rec completed. ?Medications to take morning of surgery: Claritin and omeprazole; albuterol PRN (bring DOS) ?Diabetic medication: NA ?Patient instructed no nail polish to be worn day of surgery. ?Patient instructed to bring photo id and insurance card day of surgery. ?Patient aware to have Driver (ride ) / caregiver for 24 hours after surgery. (Sister, Georgina Peer to drive) ?Patient Special Instructions: NA ?Pre-Op special Istructions: NA ?Patient verbalized understanding of instructions that were given at this phone interview. ?Patient denies shortness of breath, chest pain, fever, cough at this phone interview.  ?

## 2021-06-20 ENCOUNTER — Encounter: Payer: Medicare Other | Admitting: *Deleted

## 2021-06-20 DIAGNOSIS — R0600 Dyspnea, unspecified: Secondary | ICD-10-CM | POA: Diagnosis not present

## 2021-06-20 NOTE — Progress Notes (Signed)
Daily Session Note ? ?Patient Details  ?Name: Paige Hester ?MRN: [id number] ?Date of Birth: [birth year] ?Referring Provider:   ?Flowsheet Row Pulmonary Rehab from 03/25/2021 in [facility] Cardiac and Pulmonary Rehab  ?Referring Provider Dr. Chesley Mires  ? ?  ? ? ?Encounter Date: 06/20/2021 ? ?Check In: ? Session Check In - 06/20/21 1333   ? ?  ? Check-In  ? Supervising physician immediately available to respond to emergencies See telemetry face sheet for immediately available ER MD   ? Location [facility]-Cardiac & Pulmonary Rehab   ? Staff Present Renita Papa, RN BSN;Joseph [place], RCP,RRT,BSRT;Jessica [place], [place], [place], [place], CCET   ? Virtual Visit No   ? Medication changes reported     No   ? Fall or balance concerns reported    No   ? Warm-up and Cool-down Performed on first and last piece of equipment   ? Resistance Training Performed Yes   ? VAD Patient? No   ? PAD/SET Patient? No   ?  ? Pain Assessment  ? Currently in Pain? No/denies   ? ?  ?  ? ?  ? ? ? ? ? ?Social History  ? ?Tobacco Use  ?Smoking Status Former  ? Packs/day: 1.00  ? Years: 27.00  ? Pack years: 27.00  ? Types: Cigarettes  ? Quit date: 1999  ? Years since quitting: 24.2  ?Smokeless Tobacco Never  ? ? ?Goals Met:  ?Independence with exercise equipment ?Exercise tolerated well ?No report of concerns or symptoms today ?Strength training completed today ? ?Goals Unmet:  ?Not Applicable ? ?Comments: Pt able to follow exercise prescription today without complaint.  Will continue to monitor for progression. ? ? ? ?Dr. Emily Filbert is Medical Director for [place].  ?Dr. Ottie Glazier is Medical Director for [facility] Pulmonary Rehabilitation. ?

## 2021-06-21 ENCOUNTER — Encounter: Payer: Self-pay | Admitting: Obstetrics & Gynecology

## 2021-06-24 ENCOUNTER — Encounter (HOSPITAL_COMMUNITY)
Admission: RE | Admit: 2021-06-24 | Discharge: 2021-06-24 | Disposition: A | Discharge status: Home / Self Care | Payer: Medicare Other | Source: Ambulatory Visit | Attending: Neurology | Admitting: Neurology

## 2021-06-24 DIAGNOSIS — N950 Postmenopausal bleeding: Secondary | ICD-10-CM | POA: Insufficient documentation

## 2021-06-24 DIAGNOSIS — R0600 Dyspnea, unspecified: Secondary | ICD-10-CM

## 2021-06-24 DIAGNOSIS — Z01812 Encounter for preprocedural laboratory examination: Secondary | ICD-10-CM | POA: Diagnosis present

## 2021-06-24 LAB — CBC
HCT: 37.4 % (ref 36.0–46.0)
Hemoglobin: 12.2 g/dL (ref 12.0–15.0)
MCH: 28.1 pg (ref 26.0–34.0)
MCHC: 32.6 g/dL (ref 30.0–36.0)
MCV: 86.2 fL (ref 80.0–100.0)
Platelets: 294 10*3/uL (ref 150–400)
RBC: 4.34 MIL/uL (ref 3.87–5.11)
RDW: 13.9 % (ref 11.5–15.5)
WBC: 4.9 10*3/uL (ref 4.0–10.5)
nRBC: 0 % (ref 0.0–0.2)

## 2021-06-24 LAB — BASIC METABOLIC PANEL
Anion gap: 7 (ref 5–15)
BUN: 16 mg/dL (ref 8–23)
CO2: 29 mmol/L (ref 22–32)
Calcium: 9.7 mg/dL (ref 8.9–10.3)
Chloride: 104 mmol/L (ref 98–111)
Creatinine, Ser: 0.82 mg/dL (ref 0.44–1.00)
GFR, Estimated: >60 mL/min (ref >60)
Glucose, Bld: 122 mg/dL — ABNORMAL HIGH (ref 70–99)
Potassium: 2.9 mmol/L — ABNORMAL LOW (ref 3.5–5.1)
Sodium: 140 mmol/L (ref 135–145)

## 2021-06-24 NOTE — Progress Notes (Signed)
Pt had abnormal BMP done today 06-24-2021, routed to Dr Harolyn Rutherford in epic. ?

## 2021-06-24 NOTE — Progress Notes (Signed)
Daily Session Note ? ?Patient Details  ?Name: Paige Hester ?MRN: [id number] ?Date of Birth: [birth year] ?Referring Provider:   ?Flowsheet Row Pulmonary Rehab from 03/25/2021 in [facility] Cardiac and Pulmonary Rehab  ?Referring Provider Dr. Chesley Mires  ? ?  ? ? ?Encounter Date: 06/24/2021 ? ?Check In: ? Session Check In - 06/24/21 1342   ? ?  ? Check-In  ? Supervising physician immediately available to respond to emergencies See telemetry face sheet for immediately available ER MD   ? Location [facility]-Cardiac & Pulmonary Rehab   ? Staff Present Birdie Sons, MPA, Nino Glow, MS, ASCM CEP, Exercise Physiologist;Joseph [place], [place]   ? Virtual Visit No   ? Medication changes reported     No   ? Fall or balance concerns reported    No   ? Tobacco Cessation No Change   ? Warm-up and Cool-down Performed on first and last piece of equipment   ? Resistance Training Performed Yes   ? VAD Patient? No   ? PAD/SET Patient? No   ?  ? Pain Assessment  ? Currently in Pain? No/denies   ? ?  ?  ? ?  ? ? ? ? ? ?Social History  ? ?Tobacco Use  ?Smoking Status Former  ? Packs/day: 1.00  ? Years: 27.00  ? Pack years: 27.00  ? Types: Cigarettes  ? Quit date: 1999  ? Years since quitting: 24.2  ?Smokeless Tobacco Never  ? ? ?Goals Met:  ?Independence with exercise equipment ?Exercise tolerated well ?No report of concerns or symptoms today ?Strength training completed today ? ?Goals Unmet:  ?Not Applicable ? ?Comments: Pt able to follow exercise prescription today without complaint.  Will continue to monitor for progression. ? ? ? ?Dr. Emily Filbert is Medical Director for [place].  ?Dr. Ottie Glazier is Medical Director for [facility] Pulmonary Rehabilitation. ?

## 2021-06-25 ENCOUNTER — Other Ambulatory Visit: Payer: Self-pay | Admitting: Obstetrics & Gynecology

## 2021-06-25 DIAGNOSIS — E876 Hypokalemia: Secondary | ICD-10-CM

## 2021-06-25 MED ORDER — POTASSIUM CHLORIDE CRYS ER 20 MEQ PO TBCR: 40.0000 meq | EXTENDED_RELEASE_TABLET | Freq: Two times a day (BID) | ORAL | 1 refills | Status: DC

## 2021-06-26 ENCOUNTER — Ambulatory Visit (HOSPITAL_BASED_OUTPATIENT_CLINIC_OR_DEPARTMENT_OTHER): Payer: Medicare Other | Admitting: Emergency Medicine

## 2021-06-26 ENCOUNTER — Encounter (HOSPITAL_BASED_OUTPATIENT_CLINIC_OR_DEPARTMENT_OTHER)
Admission: RE | Disposition: A | Discharge status: Home / Self Care | Payer: Self-pay | Source: Home / Self Care | Attending: Obstetrics & Gynecology

## 2021-06-26 ENCOUNTER — Other Ambulatory Visit: Payer: Self-pay

## 2021-06-26 ENCOUNTER — Encounter (HOSPITAL_BASED_OUTPATIENT_CLINIC_OR_DEPARTMENT_OTHER): Payer: Self-pay | Admitting: Obstetrics & Gynecology

## 2021-06-26 ENCOUNTER — Ambulatory Visit (HOSPITAL_BASED_OUTPATIENT_CLINIC_OR_DEPARTMENT_OTHER): Payer: Medicare Other | Admitting: Anesthesiology

## 2021-06-26 ENCOUNTER — Ambulatory Visit (HOSPITAL_BASED_OUTPATIENT_CLINIC_OR_DEPARTMENT_OTHER)
Admission: RE | Admit: 2021-06-26 | Discharge: 2021-06-26 | Disposition: A | Discharge status: Home / Self Care | Payer: Medicare Other | Attending: Obstetrics & Gynecology | Admitting: Obstetrics & Gynecology

## 2021-06-26 DIAGNOSIS — R9389 Abnormal findings on diagnostic imaging of other specified body structures: Secondary | ICD-10-CM | POA: Diagnosis not present

## 2021-06-26 DIAGNOSIS — I10 Essential (primary) hypertension: Secondary | ICD-10-CM | POA: Diagnosis not present

## 2021-06-26 DIAGNOSIS — N840 Polyp of corpus uteri: Secondary | ICD-10-CM | POA: Insufficient documentation

## 2021-06-26 DIAGNOSIS — N950 Postmenopausal bleeding: Secondary | ICD-10-CM | POA: Diagnosis present

## 2021-06-26 DIAGNOSIS — Z87891 Personal history of nicotine dependence: Secondary | ICD-10-CM | POA: Insufficient documentation

## 2021-06-26 DIAGNOSIS — Z6841 Body Mass Index (BMI) 40.0 and over, adult: Secondary | ICD-10-CM | POA: Diagnosis not present

## 2021-06-26 DIAGNOSIS — K219 Gastro-esophageal reflux disease without esophagitis: Secondary | ICD-10-CM | POA: Diagnosis not present

## 2021-06-26 DIAGNOSIS — G4730 Sleep apnea, unspecified: Secondary | ICD-10-CM | POA: Diagnosis not present

## 2021-06-26 DIAGNOSIS — Z79899 Other long term (current) drug therapy: Secondary | ICD-10-CM | POA: Insufficient documentation

## 2021-06-26 HISTORY — PX: HYSTEROSCOPY WITH D & C: SHX1775

## 2021-06-26 HISTORY — PX: INTRAUTERINE DEVICE (IUD) INSERTION: SHX5877

## 2021-06-26 HISTORY — DX: Sleep apnea, unspecified: G47.30

## 2021-06-26 LAB — POCT I-STAT, CHEM 8
BUN: 12 mg/dL (ref 8–23)
Calcium, Ion: 1.34 mmol/L (ref 1.15–1.40)
Chloride: 103 mmol/L (ref 98–111)
Creatinine, Ser: 0.7 mg/dL (ref 0.44–1.00)
Glucose, Bld: 92 mg/dL (ref 70–99)
HCT: 37 % (ref 36.0–46.0)
Hemoglobin: 12.6 g/dL (ref 12.0–15.0)
Potassium: 3.6 mmol/L (ref 3.5–5.1)
Sodium: 141 mmol/L (ref 135–145)
TCO2: 25 mmol/L (ref 22–32)

## 2021-06-26 SURGERY — DILATATION AND CURETTAGE /HYSTEROSCOPY
Anesthesia: General | Site: Uterus

## 2021-06-26 MED ORDER — BUPIVACAINE HCL 0.5 % IJ SOLN
INTRAMUSCULAR | Status: DC | PRN
  Administered 2021-06-26: 30 mL

## 2021-06-26 MED ORDER — LACTATED RINGERS IV SOLN: INTRAVENOUS | Status: DC

## 2021-06-26 MED ORDER — OXYCODONE-ACETAMINOPHEN 5-325 MG PO TABS: 1.0000 | ORAL_TABLET | ORAL | 0 refills | Status: DC | PRN

## 2021-06-26 MED ORDER — KETOROLAC TROMETHAMINE 30 MG/ML IJ SOLN
INTRAMUSCULAR | Status: DC | PRN
  Administered 2021-06-26: 30 mg via INTRAVENOUS

## 2021-06-26 MED ORDER — ACETAMINOPHEN 500 MG PO TABS
ORAL_TABLET | ORAL | Status: AC
Start: 2021-06-26 — End: ?
  Filled 2021-06-26: qty 2

## 2021-06-26 MED ORDER — LIDOCAINE HCL (PF) 2 % IJ SOLN
INTRAMUSCULAR | Status: AC
  Filled 2021-06-26: qty 5

## 2021-06-26 MED ORDER — DEXAMETHASONE SODIUM PHOSPHATE 10 MG/ML IJ SOLN
INTRAMUSCULAR | Status: AC
  Filled 2021-06-26: qty 1

## 2021-06-26 MED ORDER — IBUPROFEN 600 MG PO TABS: 600.0000 mg | ORAL_TABLET | Freq: Four times a day (QID) | ORAL | 2 refills | Status: DC | PRN

## 2021-06-26 MED ORDER — SODIUM CHLORIDE 0.9 % IR SOLN
Status: DC | PRN
  Administered 2021-06-26: 3000 mL

## 2021-06-26 MED ORDER — DEXAMETHASONE SODIUM PHOSPHATE 10 MG/ML IJ SOLN
INTRAMUSCULAR | Status: DC | PRN
  Administered 2021-06-26: 10 mg via INTRAVENOUS

## 2021-06-26 MED ORDER — PROPOFOL 10 MG/ML IV BOLUS
INTRAVENOUS | Status: AC
  Filled 2021-06-26: qty 20

## 2021-06-26 MED ORDER — FENTANYL CITRATE (PF) 100 MCG/2ML IJ SOLN
INTRAMUSCULAR | Status: AC
  Filled 2021-06-26: qty 2

## 2021-06-26 MED ORDER — FENTANYL CITRATE (PF) 100 MCG/2ML IJ SOLN
INTRAMUSCULAR | Status: DC | PRN
  Administered 2021-06-26: 50 ug via INTRAVENOUS

## 2021-06-26 MED ORDER — LEVONORGESTREL 20 MCG/DAY IU IUD
1.0000 | INTRAUTERINE_SYSTEM | INTRAUTERINE | Status: AC
  Administered 2021-06-26: 1 via INTRAUTERINE

## 2021-06-26 MED ORDER — POVIDONE-IODINE 10 % EX SWAB: 2.0000 "application " | Freq: Once | CUTANEOUS | Status: DC

## 2021-06-26 MED ORDER — ONDANSETRON HCL 4 MG/2ML IJ SOLN
INTRAMUSCULAR | Status: AC
  Filled 2021-06-26: qty 2

## 2021-06-26 MED ORDER — MIDAZOLAM HCL 2 MG/2ML IJ SOLN
INTRAMUSCULAR | Status: AC
  Filled 2021-06-26: qty 2

## 2021-06-26 MED ORDER — GABAPENTIN 300 MG PO CAPS
ORAL_CAPSULE | ORAL | Status: AC
  Filled 2021-06-26: qty 1

## 2021-06-26 MED ORDER — PROPOFOL 10 MG/ML IV BOLUS
INTRAVENOUS | Status: DC | PRN
  Administered 2021-06-26: 150 mg via INTRAVENOUS

## 2021-06-26 MED ORDER — LIDOCAINE 2% (20 MG/ML) 5 ML SYRINGE
INTRAMUSCULAR | Status: DC | PRN
  Administered 2021-06-26: 60 mg via INTRAVENOUS

## 2021-06-26 MED ORDER — GABAPENTIN 300 MG PO CAPS
300.0000 mg | ORAL_CAPSULE | ORAL | Status: AC
  Administered 2021-06-26: 300 mg via ORAL

## 2021-06-26 MED ORDER — ACETAMINOPHEN 500 MG PO TABS
1000.0000 mg | ORAL_TABLET | ORAL | Status: AC
  Administered 2021-06-26: 1000 mg via ORAL

## 2021-06-26 MED ORDER — ONDANSETRON HCL 4 MG/2ML IJ SOLN
INTRAMUSCULAR | Status: DC | PRN
  Administered 2021-06-26: 4 mg via INTRAVENOUS

## 2021-06-26 MED ORDER — MIDAZOLAM HCL 2 MG/2ML IJ SOLN
INTRAMUSCULAR | Status: DC | PRN
  Administered 2021-06-26: 2 mg via INTRAVENOUS

## 2021-06-26 MED ORDER — KETOROLAC TROMETHAMINE 30 MG/ML IJ SOLN
INTRAMUSCULAR | Status: AC
  Filled 2021-06-26: qty 1

## 2021-06-26 SURGICAL SUPPLY — 17 items
CATH ROBINSON RED A/P 16FR (CATHETERS) ×3 IMPLANT
DEVICE MYOSURE LITE (MISCELLANEOUS) IMPLANT
DEVICE MYOSURE REACH (MISCELLANEOUS) IMPLANT
DRSG TELFA 3X8 NADH (GAUZE/BANDAGES/DRESSINGS) ×2 IMPLANT
GAUZE 4X4 16PLY ~~LOC~~+RFID DBL (SPONGE) ×3 IMPLANT
GLOVE BIOGEL PI IND STRL 7.0 (GLOVE) ×4 IMPLANT
GLOVE BIOGEL PI INDICATOR 7.0 (GLOVE) ×2
GLOVE ECLIPSE 7.0 STRL STRAW (GLOVE) ×3 IMPLANT
GOWN STRL REUS W/TWL LRG LVL3 (GOWN DISPOSABLE) ×6 IMPLANT
KIT PROCEDURE FLUENT (KITS) ×3 IMPLANT
KIT TURNOVER CYSTO (KITS) ×3 IMPLANT
Mirena IUD ×1 IMPLANT
PACK VAGINAL MINOR WOMEN LF (CUSTOM PROCEDURE TRAY) ×3 IMPLANT
PAD DRESSING TELFA 3X8 NADH (GAUZE/BANDAGES/DRESSINGS) ×2 IMPLANT
PAD OB MATERNITY 4.3X12.25 (PERSONAL CARE ITEMS) ×3 IMPLANT
SEAL ROD LENS SCOPE MYOSURE (ABLATOR) ×3 IMPLANT
UNDERPAD 30X36 HEAVY ABSORB (UNDERPADS AND DIAPERS) ×3 IMPLANT

## 2021-06-26 NOTE — Anesthesia Procedure Notes (Signed)
Procedure Name: LMA Insertion ?Date/Time: 06/26/2021 12:47 PM ?Performed by: Mechele Claude, CRNA ?Pre-anesthesia Checklist: Patient identified, Emergency Drugs available, Suction available and Patient being monitored ?Patient Re-evaluated:Patient Re-evaluated prior to induction ?Oxygen Delivery Method: Circle system utilized ?Preoxygenation: Pre-oxygenation with 100% oxygen ?Induction Type: IV induction ?Ventilation: Mask ventilation without difficulty ?LMA: LMA inserted ?LMA Size: 4.0 ?Number of attempts: 1 ?Airway Equipment and Method: Bite block ?Placement Confirmation: positive ETCO2 ?Tube secured with: Tape ?Dental Injury: Teeth and Oropharynx as per pre-operative assessment  ? ? ? ? ?

## 2021-06-26 NOTE — Transfer of Care (Signed)
Immediate Anesthesia Transfer of Care Note ? ?Patient: Paige Hester ? ?Procedure(s) Performed: Procedure(s) (LRB): ?DILATATION AND CURETTAGE /HYSTEROSCOPY (N/A) ?INTRAUTERINE DEVICE (IUD) INSERTION (N/A) ? ?Patient Location: PACU ? ?Anesthesia Type: General ? ?Level of Consciousness: awake, alert  and oriented ? ?Airway & Oxygen Therapy: Patient Spontanous Breathing  ? ?Post-op Assessment: Report given to PACU RN and Post -op Vital signs reviewed and stable ? ?Post vital signs: Reviewed and stable ? ?Complications: No apparent anesthesia complications ? ?Last Vitals:  ?Vitals Value Taken Time  ?BP 144/74 06/26/21 1335  ?Temp 36.6 ?C 06/26/21 1335  ?Pulse 81 06/26/21 1335  ?Resp 19 06/26/21 1335  ?SpO2 91 % 06/26/21 1335  ?Vitals shown include unvalidated device data. ? ?Last Pain:  ?Vitals:  ? 06/26/21 1147  ?TempSrc: Oral  ?PainSc: 0-No pain  ?   ? ?Patients Stated Pain Goal: 5 (06/26/21 1147) ? ?Complications: No notable events documented. ?

## 2021-06-26 NOTE — Anesthesia Preprocedure Evaluation (Addendum)
Anesthesia Evaluation  ?Patient identified by MRN, date of birth, ID band ?Patient awake ? ? ? ?Reviewed: ?Allergy & Precautions, H&P , NPO status , Patient's Chart, lab work & pertinent test results ? ?Airway ?Mallampati: III ? ?TM Distance: >3 FB ?Neck ROM: Full ? ? ? Dental ?no notable dental hx. ?(+) Teeth Intact, Dental Advisory Given ?  ?Pulmonary ?sleep apnea , former smoker,  ?  ?Pulmonary exam normal ?breath sounds clear to auscultation ? ? ? ? ? ? Cardiovascular ?hypertension, Pt. on medications ? ?Rhythm:Regular Rate:Normal ? ? ?  ?Neuro/Psych ?negative neurological ROS ? negative psych ROS  ? GI/Hepatic ?Neg liver ROS, GERD  Medicated,  ?Endo/Other  ?Morbid obesity ? Renal/GU ?negative Renal ROS  ?negative genitourinary ?  ?Musculoskeletal ? ? Abdominal ?  ?Peds ? Hematology ? ?(+) Blood dyscrasia, anemia ,   ?Anesthesia Other Findings ? ? Reproductive/Obstetrics ?negative OB ROS ? ?  ? ? ? ? ? ? ? ? ? ? ? ? ? ?  ?  ? ? ? ? ? ? ? ?Anesthesia Physical ?Anesthesia Plan ? ?ASA: 3 ? ?Anesthesia Plan: General  ? ?Post-op Pain Management: Tylenol PO (pre-op)*  ? ?Induction: Intravenous ? ?PONV Risk Score and Plan: 4 or greater and Ondansetron, Dexamethasone and Midazolam ? ?Airway Management Planned: LMA ? ?Additional Equipment:  ? ?Intra-op Plan:  ? ?Post-operative Plan: Extubation in OR ? ?Informed Consent: I have reviewed the patients History and Physical, chart, labs and discussed the procedure including the risks, benefits and alternatives for the proposed anesthesia with the patient or authorized representative who has indicated his/her understanding and acceptance.  ? ? ? ?Dental advisory given ? ?Plan Discussed with: CRNA ? ?Anesthesia Plan Comments:   ? ? ? ? ? ? ?Anesthesia Quick Evaluation ? ?

## 2021-06-26 NOTE — Op Note (Signed)
PREOPERATIVE DIAGNOSIS:  Recurrent postmenopausal bleeding, thickened endometrium, declines office endometrial biopsy and IUD placement.   ?POSTOPERATIVE DIAGNOSIS: The same ?PROCEDURE: Hysteroscopy, Dilation and Curettage, Mirena intrauterine device insertion ?SURGEON:  Dr. Verita Schneiders ? ?INDICATIONS: 66 y.o. [id number] here for scheduled surgery for the aforementioned diagnoses.   Risks of surgery were discussed with the patient including but not limited to: bleeding which may require transfusion; infection which may require antibiotics; injury to uterus or surrounding organs; intrauterine scarring which may impair future fertility; need for additional procedures including laparotomy or laparoscopy; and other postoperative/anesthesia complications. For the IUD placement, discussed risks of irregular bleeding, cramping, infection, malpositioning or misplacement of the IUD outside the uterus which may require further procedure such as laparoscopy.  Written informed consent was obtained.   ? ?FINDINGS:  A 7 week size uterus.  Diffuse proliferative endometrium.  Normal ostia bilaterally. Mirena IUD placed. ? ?ANESTHESIA:   General, paracervical block with 30 ml of 0.5% Marcaine ?FLUID DEFICITS:  50 ml of NS ?ESTIMATED BLOOD LOSS:  20 ml ?SPECIMENS: Endometrial curettings sent to pathology ?COMPLICATIONS:  None immediate. ? ?PROCEDURE DETAILS:  The patient was then taken to the operating room where general anesthesia was administered and was found to be adequate.  After an adequate timeout was performed, she was placed in the dorsal lithotomy position and examined; then prepped and draped in the sterile manner.   Her bladder was catheterized for an unmeasured amount of clear, yellow urine. A speculum was then placed in the patient's vagina and a single tooth tenaculum was applied to the anterior lip of the cervix.   A paracervical block using 30 ml of 0.5% Marcaine was administered.  The uterus was sounded to 7 cm and  the cervix was dilated manually with metal dilators to accommodate the 5 mm diagnostic hysteroscope.  The hysteroscope was then inserted under direct visualization using NS as a suspension medium.  The uterine cavity was carefully examined with the findings as noted above.   After further careful visualization of the uterine cavity, the hysteroscope was removed under direct visualization.  A sharp curettage was then performed to obtain a moderate amount of endometrial curettings.  The Mirena IUD inserted into the upper fundal area of endometrial cavity as per manufacturer's recommendations.  Strings trimmed to 3 cm.  The tenaculum was removed from the anterior lip of the cervix and the vaginal speculum was removed after noting good hemostasis.  The patient tolerated the procedure well and was taken to the recovery area awake, extubated and in stable condition. ? ?The patient will be discharged to home as per PACU criteria.  Routine postoperative instructions given.  She was prescribed Percocet, Ibuprofen and Colace as needed.  She will follow up in the office in 3-4 weeks for postoperative evaluation and IUD check. ? ? ?Verita Schneiders, MD, FACOG ?Obstetrician Social research officer, government, Faculty Practice ?[facility] [place] ?

## 2021-06-26 NOTE — Discharge Instructions (Signed)
?  Post Anesthesia Home Care Instructions ? ?Activity: ?Get plenty of rest for the remainder of the day. A responsible individual must stay with you for 24 hours following the procedure.  ?For the next 24 hours, DO NOT: ?-Drive a car ?-Paediatric nurse ?-Drink alcoholic beverages ?-Take any medication unless instructed by your physician ?-Make any legal decisions or sign important papers. ? ?Meals: ?Start with liquid foods such as gelatin or soup. Progress to regular foods as tolerated. Avoid greasy, spicy, heavy foods. If nausea and/or vomiting occur, drink only clear liquids until the nausea and/or vomiting subsides. Call your physician if vomiting continues. ? ?Special Instructions/Symptoms: ?Your throat may feel dry or sore from the anesthesia or the breathing tube placed in your throat during surgery. If this causes discomfort, gargle with warm salt water. The discomfort should disappear within 24 hours. ? ? ? ?D & C Home care Instructions: ? ? ?Personal hygiene:  Used sanitary napkins for vaginal drainage not tampons. Shower or tub bathe the day after your procedure. No douching until bleeding stops. Always wipe from front to back after  Elimination. ? ?Activity: Do not drive or operate any equipment today. The effects of the anesthesia are still present and drowsiness may result. Rest today, not necessarily flat bed rest, just take it easy. You may resume your normal activity in one to 2 days. ? ?Sexual activity: No intercourse for one week or as indicated by your physician ? ?Diet: Eat a light diet as desired this evening. You may resume a regular diet tomorrow. ? ?Return to work: One to 2 days. ? ?General Expectations of your surgery: Vaginal bleeding should be no heavier than a normal period. Spotting may continue up to 10 days. Mild cramps may continue for a couple of days. You may have a regular period in 2-6 weeks. ? ?Unexpected observations call your doctor if these occur: persistent or heavy bleeding.  Severe abdominal cramping or pain. Elevation of temperature greater than 100?F. ? ?May take Tylenol beginning at 6 PM for cramping/discomfort. ?    ?

## 2021-06-26 NOTE — Anesthesia Postprocedure Evaluation (Signed)
Anesthesia Post Note ? ?Patient: Paige Hester ? ?Procedure(s) Performed: DILATATION AND CURETTAGE /HYSTEROSCOPY (Uterus) ?INTRAUTERINE DEVICE (IUD) INSERTION (Uterus) ? ?  ? ?Patient location during evaluation: PACU ?Anesthesia Type: General ?Level of consciousness: awake and alert ?Pain management: pain level controlled ?Vital Signs Assessment: post-procedure vital signs reviewed and stable ?Respiratory status: spontaneous breathing, nonlabored ventilation and respiratory function stable ?Cardiovascular status: blood pressure returned to baseline and stable ?Postop Assessment: no apparent nausea or vomiting ?Anesthetic complications: no ? ? ?No notable events documented. ? ?Last Vitals:  ?Vitals:  ? 06/26/21 1345 06/26/21 1400  ?BP: (!) 161/70 (!) 141/68  ?Pulse: 74 67  ?Resp: (!) 22 18  ?Temp:    ?SpO2: 94% 95%  ?  ?Last Pain:  ?Vitals:  ? 06/26/21 1400  ?TempSrc:   ?PainSc: 0-No pain  ? ? ?  ?  ?  ?  ?  ?  ? ?Emre Stock,W. EDMOND ? ? ? ? ?

## 2021-06-26 NOTE — Interval H&P Note (Signed)
History and Physical Interval Note ?06/26/2021 ?12:31 PM ? ?Paige Hester  has presented today for surgery, with the diagnosis of recurrent postmenopausal bleeding, thickened endometrium, declines office endometrial biopsy and IUD placement.  The various methods of treatment have been discussed with the patient and family. After consideration of risks, benefits and other options for treatment, the patient has consented to  Procedure(s): DILATATION AND CURETTAGE / HYSTEROSCOPY/ MIRENA INTRAUTERINE DEVICE (IUD) INSERTION as a surgical intervention.  The patient's history has been reviewed, patient examined, no change in status, stable for surgery.  I have reviewed the patient's chart and labs. She was hypokalemic during her preop visit and was prescribed oral supplementation, repeat K was 3.6 today.  Questions were answered to the patient's satisfaction.   To OR when ready. ? ? ?Verita Schneiders, MD, FACOG ?Obstetrician Social research officer, government, Faculty Practice ?[facility] [place] ?

## 2021-06-27 ENCOUNTER — Encounter (HOSPITAL_BASED_OUTPATIENT_CLINIC_OR_DEPARTMENT_OTHER): Payer: Self-pay | Admitting: Obstetrics & Gynecology

## 2021-06-27 LAB — SURGICAL PATHOLOGY

## 2021-07-03 ENCOUNTER — Encounter: Payer: Self-pay | Admitting: *Deleted

## 2021-07-03 DIAGNOSIS — R0600 Dyspnea, unspecified: Secondary | ICD-10-CM

## 2021-07-03 NOTE — Progress Notes (Signed)
Pulmonary Individual Treatment Plan ? ?Patient Details  ?Name: Paige Hester ?MRN: [id number] ?Date of Birth: 1955/04/14 ?Referring Provider:   ?Flowsheet Row Pulmonary Rehab from 03/25/2021 in [facility] Cardiac and Pulmonary Rehab  ?Referring Provider Dr. Chesley Mires  ? ?  ? ? ?Initial Encounter Date:  ?Flowsheet Row Pulmonary Rehab from 03/25/2021 in [facility] Cardiac and Pulmonary Rehab  ?Date 03/25/21  ? ?  ? ? ?Visit Diagnosis: Dyspnea, unspecified type ? ?Patient's Home Medications on Admission: ? ?Current Outpatient Medications:  ?  albuterol (VENTOLIN HFA) 108 (90 Base) MCG/ACT inhaler, Inhale 2 puffs into the lungs every 6 (six) hours as needed for wheezing or shortness of breath., Disp: 8 g, Rfl: 2 ?  amLODipine (NORVASC) 10 MG tablet, Take 1 tablet by mouth once daily for blood pressure (Patient taking differently: Pt takes at night), Disp: 90 tablet, Rfl: 1 ?  Cholecalciferol (VITAMIN D3) 1000 UNITS tablet, Take 1,000 Units by mouth daily., Disp: , Rfl:  ?  docusate sodium (COLACE) 100 MG capsule, Take 1 capsule (100 mg total) by mouth 2 (two) times daily as needed for mild constipation or moderate constipation., Disp: 30 capsule, Rfl: 2 ?  fluticasone (FLONASE) 50 MCG/ACT nasal spray, Place 1 spray into both nostrils 2 (two) times daily as needed for allergies or rhinitis., Disp: 16 g, Rfl: 0 ?  ibuprofen (ADVIL) 600 MG tablet, Take 1 tablet (600 mg total) by mouth every 6 (six) hours as needed for headache, mild pain, moderate pain or cramping., Disp: 30 tablet, Rfl: 2 ?  lidocaine (LIDODERM) 5 %, Place 1 patch onto the skin daily. Remove & Discard patch within 12 hours or as directed by MD, Disp: 30 patch, Rfl: 0 ?  loratadine (CLARITIN) 10 MG tablet, Take 10 mg by mouth daily., Disp: , Rfl:  ?  losartan-hydrochlorothiazide (HYZAAR) 100-25 MG tablet, Take 1 tablet by mouth once daily for blood pressure, Disp: 90 tablet, Rfl: 1 ?  Melatonin 10 MG TABS, Take by mouth as needed., Disp: , Rfl:  ?  methocarbamol  (ROBAXIN) 500 MG tablet, Take 1 tablet (500 mg total) by mouth every 8 (eight) hours as needed for muscle spasms., Disp: 60 tablet, Rfl: 1 ?  omeprazole (PRILOSEC) 20 MG capsule, Take 20 mg by mouth every other day., Disp: , Rfl:  ?  oxyCODONE-acetaminophen (PERCOCET/ROXICET) 5-325 MG tablet, Take 1 tablet by mouth every 4 (four) hours as needed for severe pain., Disp: 15 tablet, Rfl: 0 ?  Potassium 99 MG TABS, Take 1 tablet by mouth in the morning and at bedtime. (Patient not taking: Reported on 06/26/2021), Disp: , Rfl:  ?  potassium chloride SA (KLOR-CON M) 20 MEQ tablet, Take 2 tablets (40 mEq total) by mouth 2 (two) times daily for 3 days., Disp: 12 tablet, Rfl: 1 ?  rosuvastatin (CRESTOR) 20 MG tablet, Take 1 tablet (20 mg total) by mouth daily., Disp: 90 tablet, Rfl: 3 ?  Semaglutide,0.25 or 0.'5MG'$ /DOS, (OZEMPIC, 0.25 OR 0.5 MG/DOSE,) 2 MG/1.5ML SOPN, Inject 0.25 mg into the skin once a week. (Patient taking differently: Inject 0.25 mg into the skin once a week. Taking for weight loss), Disp: 1.5 mL, Rfl: 0 ? ?Past Medical History: ?Past Medical History:  ?Diagnosis Date  ? Allergy   ? Bilateral swelling of feet and ankles   ? Gallbladder polyp 2012  ? GERD (gastroesophageal reflux disease)   ? Hyperlipidemia   ? Hypertension   ? Iron deficiency anemia   ? Menopausal symptoms   ? since  2007  ? Murmur, cardiac   ? Other fatigue   ? Prediabetes   ? Pulmonary embolism ([place]) 06/2020  ? Shortness of breath   ? with exertion; patient going to pulmonary rehabilitation  ? Shortness of breath on exertion   ? Sleep apnea   ? Uses CPAP  ? Vitamin D deficiency   ? ? ?Tobacco Use: ?Social History  ? ?Tobacco Use  ?Smoking Status Former  ? Packs/day: 1.00  ? Years: 27.00  ? Pack years: 27.00  ? Types: Cigarettes  ? Quit date: 1999  ? Years since quitting: 24.3  ?Smokeless Tobacco Never  ? ? ?Labs: ?Review Flowsheet   ? ?  ?  Latest Ref Rng & Units 11/18/2019 10/18/2020 01/22/2021 02/12/2021  ?Labs for [facility] Cardiac and Pulmonary  Rehab  ?Cholestrol 0 - 200 mg/dL 158   108   130   114    ?LDL (calc) 0 - 99 mg/dL 105   54   58   47    ?HDL-C >39.00 mg/dL 42.10   45.60   61   57.00    ?Trlycerides 0.0 - 149.0 mg/dL 52.0   43.0   44   46.0    ?Hemoglobin A1c 4.8 - 5.6 % 6.0   6.1   5.6     ?TCO2 22 - 32 mmol/L      ? ?  06/26/2021  ?Labs for [facility] Cardiac and Pulmonary Rehab  ?Cholestrol   ?LDL (calc)   ?HDL-C   ?Trlycerides   ?Hemoglobin A1c   ?TCO2 25    ?  ? ? Multiple values from one day are sorted in reverse-chronological order  ?  ?  ? ? ? ?Pulmonary Assessment Scores: ? Pulmonary Assessment Scores   ? ? [place] Name 03/25/21 1710  ?  ?  ?  ? ADL UCSD  ? SOB Score total 72    ? Rest 0    ? Walk 4    ? Stairs 5    ? Bath 2    ? Dress 3    ? Shop 4    ?  ? CAT Score  ? CAT Score 19    ?  ? mMRC Score  ? mMRC Score 3    ? ?  ?  ? ?  ?  ?UCSD: ?Self-administered rating of dyspnea associated with activities of daily living (ADLs) ?6-point scale (0 = "not at all" to 5 = "maximal or unable to do because of breathlessness")  ?Scoring Scores range from 0 to 120.  Minimally important difference is 5 units ? ?CAT: ?CAT can identify the health impairment of COPD patients and is better correlated with disease progression.  ?CAT has a scoring range of zero to 40. The CAT score is classified into four groups of low (less than 10), medium (10 - 20), high (21-30) and very high (31-40) based on the impact level of disease on health status. A CAT score over 10 suggests significant symptoms.  A worsening CAT score could be explained by an exacerbation, poor medication adherence, poor inhaler technique, or progression of COPD or comorbid conditions.  ?CAT MCID is 2 points ? ?mMRC: ?mMRC (Modified Medical Research Council) Dyspnea Scale is used to assess the degree of baseline functional disability in patients of respiratory disease due to dyspnea. ?No minimal important difference is established. A decrease in score of 1 point or greater is considered a positive  change.  ? ?Pulmonary Function Assessment: ? ? ?Exercise Target  Goals: ?Exercise Program Goal: ?Individual exercise prescription set using results from initial 6 min walk test and THRR while considering  patient?s activity barriers and safety.  ? ?Exercise Prescription Goal: ?Initial exercise prescription builds to 30-45 minutes a day of aerobic activity, 2-3 days per week.  Home exercise guidelines will be given to patient during program as part of exercise prescription that the participant will acknowledge. ? ?Education: Aerobic Exercise: ?- Group verbal and visual presentation on the components of exercise prescription. Introduces F.I.T.T principle from ACSM for exercise prescriptions.  Reviews F.I.T.T. principles of aerobic exercise including progression. Written material given at graduation. ? ? ?Education: Resistance Exercise: ?- Group verbal and visual presentation on the components of exercise prescription. Introduces F.I.T.T principle from ACSM for exercise prescriptions  Reviews F.I.T.T. principles of resistance exercise including progression. Written material given at graduation. ? ?  ?Education: Exercise & Equipment Safety: ?- Individual verbal instruction and demonstration of equipment use and safety with use of the equipment. ?Flowsheet Row Pulmonary Rehab from 06/19/2021 in [facility] Cardiac and Pulmonary Rehab  ?Date 03/25/21  ?Educator AS  ?Instruction Review Code 1- Verbalizes Understanding  ? ?  ? ? ?Education: Exercise Physiology & General Exercise Guidelines: ?- Group verbal and written instruction with models to review the exercise physiology of the cardiovascular system and associated critical values. Provides general exercise guidelines with specific guidelines to those with heart or lung disease.  ?Flowsheet Row Pulmonary Rehab from 06/19/2021 in [facility] Cardiac and Pulmonary Rehab  ?Date 05/08/21  ?Educator AS  ?Instruction Review Code 1- Verbalizes Understanding  ? ?  ? ? ?Education: Flexibility,  Balance, Mind/Body Relaxation: ?- Group verbal and visual presentation with interactive activity on the components of exercise prescription. Introduces F.I.T.T principle from ACSM for exercise prescriptions. Review

## 2021-07-04 ENCOUNTER — Telehealth: Payer: Self-pay

## 2021-07-04 NOTE — Telephone Encounter (Signed)
LMOM

## 2021-07-15 ENCOUNTER — Encounter: Payer: Self-pay | Admitting: Family Medicine

## 2021-07-15 ENCOUNTER — Ambulatory Visit (INDEPENDENT_AMBULATORY_CARE_PROVIDER_SITE_OTHER): Payer: Medicare Other | Admitting: Family Medicine

## 2021-07-15 VITALS — BP 120/60 | HR 75 | Temp 98.4°F | Ht 65.0 in | Wt 278.4 lb

## 2021-07-15 DIAGNOSIS — M65322 Trigger finger, left index finger: Secondary | ICD-10-CM | POA: Diagnosis not present

## 2021-07-15 MED ORDER — TRIAMCINOLONE ACETONIDE 40 MG/ML IJ SUSP
20.0000 mg | Freq: Once | INTRAMUSCULAR | Status: AC
  Administered 2021-07-15: 20 mg via INTRA_ARTICULAR

## 2021-07-15 NOTE — Addendum Note (Signed)
Addended by: Carter Kitten on: 07/15/2021 03:50 PM ? ? Modules accepted: Orders ? ?

## 2021-07-15 NOTE — Progress Notes (Signed)
? ? ?  Paige Bogan T. Cordero Surette, MD, [facility] ?Therapist, music at [facility] ?[place] ?[place], [address] ? ?Phone: [phone]  FAX: [phone] ? ?Paige Hester - 66 y.o. female  MRN [id number]  Date of Birth: 1955-08-06 ? ?Date: 07/15/2021  PCP: Pleas Koch, NP  Referral: Pleas Koch, NP ? ?Chief Complaint  ?Patient presents with  ? Trigger Finger  ?  Left Index  ? ? ?This visit occurred during the SARS-CoV-2 public health emergency.  Safety protocols were in place, including screening questions prior to the visit, additional usage of staff PPE, and extensive cleaning of exam room while observing appropriate contact time as indicated for disinfecting solutions.   ? ? ?Tendon Sheath Injection Procedure Note ?Paige Hester ?1955/06/30 ?Date of procedure: 07/15/2021 ? ?Procedure: Tendon Sheath Injection for Trigger Finger, L 2nd ?Indications: Pain ? ?Procedure Details ?Verbal consent was obtained. Risks (including potential risk for skin lightening and potential atrophy), benefits and alternatives were discussed. Prepped with Chloraprep and Ethyl Chloride used for anesthesia. Under sterile conditions, patient injected at palmar crease aiming distally with 45 degree angle towards nodule; injected directly into tendon sheath. Medication flowed freely without resistance.  ?Needle size: 22 gauge 1 1/2 inch ?Injection: 1/2 cc of Lidocaine 1% and Kenalog 20 mg ?Medication: 1/2 cc of Kenalog 40 mg (equaling Kenalog 20 mg)  ?

## 2021-07-17 ENCOUNTER — Encounter: Payer: Federal, State, Local not specified - PPO | Attending: Pulmonary Disease

## 2021-07-17 DIAGNOSIS — Z86711 Personal history of pulmonary embolism: Secondary | ICD-10-CM | POA: Diagnosis not present

## 2021-07-17 DIAGNOSIS — R0600 Dyspnea, unspecified: Secondary | ICD-10-CM | POA: Insufficient documentation

## 2021-07-17 NOTE — Progress Notes (Signed)
Daily Session Note ? ?Patient Details  ?Name: Paige Hester ?MRN: [id number] ?Date of Birth: [birth year] ?Referring Provider:   ?Flowsheet Row Pulmonary Rehab from 03/25/2021 in [facility] Cardiac and Pulmonary Rehab  ?Referring Provider Dr. Chesley Mires  ? ?  ? ? ?Encounter Date: 07/17/2021 ? ?Check In: ? Session Check In - 07/17/21 1600   ? ?  ? Check-In  ? Supervising physician immediately available to respond to emergencies See telemetry face sheet for immediately available ER MD   ? Location [facility]-Cardiac & Pulmonary Rehab   ? Staff Present Birdie Sons, MPA, RN;Melissa Alton, RDN, LDN;Joseph [place], RCP,RRT,BSRT   ? Virtual Visit No   ? Medication changes reported     No   ? Fall or balance concerns reported    No   ? Tobacco Cessation No Change   ? Warm-up and Cool-down Performed on first and last piece of equipment   ? Resistance Training Performed Yes   ? VAD Patient? No   ? PAD/SET Patient? No   ?  ? Pain Assessment  ? Currently in Pain? No/denies   ? ?  ?  ? ?  ? ? ? ? ? ?Social History  ? ?Tobacco Use  ?Smoking Status Former  ? Packs/day: 1.00  ? Years: 27.00  ? Pack years: 27.00  ? Types: Cigarettes  ? Quit date: 1999  ? Years since quitting: 24.3  ?Smokeless Tobacco Never  ? ? ?Goals Met:  ?Independence with exercise equipment ?Exercise tolerated well ?No report of concerns or symptoms today ?Strength training completed today ? ?Goals Unmet:  ?Not Applicable ? ?Comments: Pt able to follow exercise prescription today without complaint.  Will continue to monitor for progression. ? ? ? ?Dr. Emily Filbert is Medical Director for [place].  ?Dr. Ottie Glazier is Medical Director for [facility] Pulmonary Rehabilitation. ?

## 2021-07-22 DIAGNOSIS — R0600 Dyspnea, unspecified: Secondary | ICD-10-CM

## 2021-07-22 NOTE — Progress Notes (Signed)
Daily Session Note ? ?Patient Details  ?Name: Paige Hester ?MRN: [id number] ?Date of Birth: 09/25/1955 ?Referring Provider:   ?Flowsheet Row Pulmonary Rehab from 03/25/2021 in [facility] Cardiac and Pulmonary Rehab  ?Referring Provider Dr. Vineet Sood  ? ?  ? ? ?Encounter Date: 07/22/2021 ? ?Check In: ? Session Check In - 07/22/21 1612   ? ?  ? Check-In  ? Supervising physician immediately available to respond to emergencies See telemetry face sheet for immediately available ER MD   ? Location [facility]-Cardiac & Pulmonary Rehab   ? Staff Present Kelly Bollinger, MPA, RN;Melissa Caiola, RDN, LDN;Kara Langdon, MS, ASCM CEP, Exercise Physiologist   ? Virtual Visit No   ? Medication changes reported     No   ? Fall or balance concerns reported    No   ? Tobacco Cessation No Change   ? Warm-up and Cool-down Performed on first and last piece of equipment   ? Resistance Training Performed Yes   ? VAD Patient? No   ? PAD/SET Patient? No   ?  ? Pain Assessment  ? Currently in Pain? No/denies   ? ?  ?  ? ?  ? ? ? ? ? ?Social History  ? ?Tobacco Use  ?Smoking Status Former  ? Packs/day: 1.00  ? Years: 27.00  ? Pack years: 27.00  ? Types: Cigarettes  ? Quit date: 1999  ? Years since quitting: 24.3  ?Smokeless Tobacco Never  ? ? ?Goals Met:  ?Independence with exercise equipment ?Exercise tolerated well ?No report of concerns or symptoms today ?Strength training completed today ? ?Goals Unmet:  ?Not Applicable ? ?Comments: Pt able to follow exercise prescription today without complaint.  Will continue to monitor for progression. ? ? ? ?Dr. Mark Miller is Medical Director for [facility].  ?Dr. Fuad Aleskerov is Medical Director for [facility] Pulmonary Rehabilitation. ?

## 2021-07-24 DIAGNOSIS — R0600 Dyspnea, unspecified: Secondary | ICD-10-CM

## 2021-07-24 NOTE — Progress Notes (Signed)
Daily Session Note ? ?Patient Details  ?Name: NATHANIEL YADEN ?MRN: [id number] ?Date of Birth: 07/08/1955 ?Referring Provider:   ?Flowsheet Row Pulmonary Rehab from 03/25/2021 in [facility] Cardiac and Pulmonary Rehab  ?Referring Provider Dr. Chesley Mires  ? ?  ? ? ?Encounter Date: 07/24/2021 ? ?Check In: ? Session Check In - 07/24/21 1542   ? ?  ? Check-In  ? Supervising physician immediately available to respond to emergencies See telemetry face sheet for immediately available ER MD   ? Location [facility]-Cardiac & Pulmonary Rehab   ? Staff Present Birdie Sons, MPA, RN;Joseph [place], RCP,RRT,BSRT;Melissa [place], RDN, LDN   ? Virtual Visit No   ? Medication changes reported     No   ? Fall or balance concerns reported    No   ? Tobacco Cessation No Change   ? Warm-up and Cool-down Performed on first and last piece of equipment   ? Resistance Training Performed Yes   ? VAD Patient? No   ? PAD/SET Patient? No   ?  ? Pain Assessment  ? Currently in Pain? No/denies   ? ?  ?  ? ?  ? ? ? ? ? ?Social History  ? ?Tobacco Use  ?Smoking Status Former  ? Packs/day: 1.00  ? Years: 27.00  ? Pack years: 27.00  ? Types: Cigarettes  ? Quit date: 1999  ? Years since quitting: 24.3  ?Smokeless Tobacco Never  ? ? ?Goals Met:  ?Independence with exercise equipment ?Exercise tolerated well ?No report of concerns or symptoms today ?Strength training completed today ? ?Goals Unmet:  ?Not Applicable ? ?Comments: Pt able to follow exercise prescription today without complaint.  Will continue to monitor for progression. ? ? ? ?Dr. Emily Filbert is Medical Director for [place].  ?Dr. Ottie Glazier is Medical Director for [facility] Pulmonary Rehabilitation. ?

## 2021-07-25 ENCOUNTER — Encounter: Payer: Self-pay | Admitting: Obstetrics & Gynecology

## 2021-07-25 ENCOUNTER — Ambulatory Visit (INDEPENDENT_AMBULATORY_CARE_PROVIDER_SITE_OTHER): Payer: Medicare Other | Admitting: Obstetrics & Gynecology

## 2021-07-25 VITALS — BP 130/70 | HR 79

## 2021-07-25 DIAGNOSIS — Z09 Encounter for follow-up examination after completed treatment for conditions other than malignant neoplasm: Secondary | ICD-10-CM | POA: Diagnosis not present

## 2021-07-25 DIAGNOSIS — R0600 Dyspnea, unspecified: Secondary | ICD-10-CM | POA: Diagnosis not present

## 2021-07-25 NOTE — Progress Notes (Signed)
Daily Session Note ? ?Patient Details  ?Name: Paige Hester ?MRN: [id number] ?Date of Birth: Mar 02, 1956 ?Referring Provider:   ?Flowsheet Row Pulmonary Rehab from 03/25/2021 in [facility] Cardiac and Pulmonary Rehab  ?Referring Provider Dr. Chesley Mires  ? ?  ? ? ?Encounter Date: 07/25/2021 ? ?Check In: ? Session Check In - 07/25/21 1734   ? ?  ? Check-In  ? Supervising physician immediately available to respond to emergencies See telemetry face sheet for immediately available ER MD   ? Location [facility]-Cardiac & Pulmonary Rehab   ? Staff Present Coralie Keens, MS, ASCM CEP, Exercise Physiologist;Fenix Ruppe, RN, BSN;Joseph [place], RCP,RRT,BSRT   ? Virtual Visit No   ? Medication changes reported     No   ? Fall or balance concerns reported    No   ? Tobacco Cessation No Change   ? Warm-up and Cool-down Performed on first and last piece of equipment   ? Resistance Training Performed Yes   ? VAD Patient? No   ? PAD/SET Patient? No   ?  ? Pain Assessment  ? Currently in Pain? No/denies   ? ?  ?  ? ?  ? ? ? ? ? ?Social History  ? ?Tobacco Use  ?Smoking Status Former  ? Packs/day: 1.00  ? Years: 27.00  ? Pack years: 27.00  ? Types: Cigarettes  ? Quit date: 1999  ? Years since quitting: 24.3  ?Smokeless Tobacco Never  ? ? ?Goals Met:  ?Independence with exercise equipment ?Exercise tolerated well ?No report of concerns or symptoms today ?Strength training completed today ? ?Goals Unmet:  ?Not Applicable ? ?Comments: Pt able to follow exercise prescription today without complaint.  Will continue to monitor for progression. ? ? ?Dr. Emily Filbert is Medical Director for [place].  ?Dr. Ottie Glazier is Medical Director for [facility] Pulmonary Rehabilitation. ?

## 2021-07-25 NOTE — Progress Notes (Signed)
? ?  GYNECOLOGY POSTOPERATIVE VISIT ? ?Subjective:  ? ? [name] is a 66 y.o. female who presents to the clinic 4 weeks status post Hysteroscopy, Dilation and Curettage, Mirena intrauterine device insertion on 06/26/2021 for recurrent postmenopausal bleeding, thickened endometrium. Had some spotting afterwards, no other concerns. Eating a regular diet without difficulty. Bowel movements are normal. The patient is not having any pain. ? ?The following portions of the patient's history were reviewed and updated as appropriate: allergies, current medications, past family history, past medical history, past social history, past surgical history, and problem list.  Normal pap and negative HRHPV on 04/24/2021.  Normal mammogram on 02/19/2021.  ? ?Review of Systems ?Pertinent items noted in HPI and remainder of comprehensive ROS otherwise negative.  ?  ?Objective:  ? ? BP 130/70   Pulse 79  ?General:  alert and no distress  ?Abdomen: soft, bowel sounds active, non-tender  ?Pelvic:   NEFG, scant bloody discharge, IUD strings in place. RN present as chaperone  ? 06/26/21 Surgical Pathology ?A. ENDOMETRIUM, CURETTAGE:  ?Benign endometrial polyp.  ?Additional fragments of disrupted endometrium with normal epithelium.  ?Negative for hyperplasia or malignancy.  ?  ?Assessment:  ? ? Doing well postoperatively. ?Operative findings again reviewed. Pathology report discussed.  ?  ?Plan:  ?  ?Reassured about spotting, can occur for first few months after IUD placement. ?Bleeding precautions reviewed with patient. ?Will continue routine gynecologic care.  ? ? ?Verita Schneiders, MD, FACOG ?Obstetrician Social research officer, government, Faculty Practice ?Center for [place] ? ?

## 2021-07-29 DIAGNOSIS — R0600 Dyspnea, unspecified: Secondary | ICD-10-CM | POA: Diagnosis not present

## 2021-07-29 NOTE — Progress Notes (Signed)
Daily Session Note ? ?Patient Details  ?Name: Paige Hester ?MRN: [id number] ?Date of Birth: June 16, 1955 ?Referring Provider:   ?Flowsheet Row Pulmonary Rehab from 03/25/2021 in [facility] Cardiac and Pulmonary Rehab  ?Referring Provider Dr. Chesley Mires  ? ?  ? ? ?Encounter Date: 07/29/2021 ? ?Check In: ? Session Check In - 07/29/21 1540   ? ?  ? Check-In  ? Supervising physician immediately available to respond to emergencies See telemetry face sheet for immediately available ER MD   ? Location [facility]-Cardiac & Pulmonary Rehab   ? Staff Present Birdie Sons, MPA, Nino Glow, MS, ASCM CEP, Exercise Physiologist;Joseph [place], [place]   ? Virtual Visit No   ? Medication changes reported     No   ? Fall or balance concerns reported    No   ? Tobacco Cessation No Change   ? Warm-up and Cool-down Performed on first and last piece of equipment   ? Resistance Training Performed Yes   ? VAD Patient? No   ? PAD/SET Patient? No   ?  ? Pain Assessment  ? Currently in Pain? No/denies   ? ?  ?  ? ?  ? ? ? ? ? ?Social History  ? ?Tobacco Use  ?Smoking Status Former  ? Packs/day: 1.00  ? Years: 27.00  ? Pack years: 27.00  ? Types: Cigarettes  ? Quit date: 1999  ? Years since quitting: 24.3  ?Smokeless Tobacco Never  ? ? ?Goals Met:  ?Independence with exercise equipment ?Exercise tolerated well ?No report of concerns or symptoms today ?Strength training completed today ? ?Goals Unmet:  ?Not Applicable ? ?Comments: Pt able to follow exercise prescription today without complaint.  Will continue to monitor for progression. ? ? ? ?Dr. Emily Filbert is Medical Director for [place].  ?Dr. Ottie Glazier is Medical Director for [facility] Pulmonary Rehabilitation. ?

## 2021-07-31 ENCOUNTER — Encounter: Payer: Self-pay | Admitting: *Deleted

## 2021-07-31 DIAGNOSIS — R0600 Dyspnea, unspecified: Secondary | ICD-10-CM | POA: Diagnosis not present

## 2021-07-31 NOTE — Progress Notes (Signed)
Daily Session Note ? ?Patient Details  ?Name: DEMYAH SMYRE ?MRN: [id number] ?Date of Birth: 05-11-1955 ?Referring Provider:   ?Flowsheet Row Pulmonary Rehab from 03/25/2021 in [facility] Cardiac and Pulmonary Rehab  ?Referring Provider Dr. Chesley Mires  ? ?  ? ? ?Encounter Date: 07/31/2021 ? ?Check In: ? Session Check In - 07/31/21 1537   ? ?  ? Check-In  ? Supervising physician immediately available to respond to emergencies See telemetry face sheet for immediately available ER MD   ? Location [facility]-Cardiac & Pulmonary Rehab   ? Staff Present Birdie Sons, MPA, RN;Joseph [place], RCP,RRT,BSRT;Melissa [place], RDN, LDN   ? Virtual Visit No   ? Medication changes reported     No   ? Fall or balance concerns reported    No   ? Tobacco Cessation No Change   ? Warm-up and Cool-down Performed on first and last piece of equipment   ? Resistance Training Performed Yes   ? VAD Patient? No   ? PAD/SET Patient? No   ?  ? Pain Assessment  ? Currently in Pain? No/denies   ? ?  ?  ? ?  ? ? ? ? ? ?Social History  ? ?Tobacco Use  ?Smoking Status Former  ? Packs/day: 1.00  ? Years: 27.00  ? Pack years: 27.00  ? Types: Cigarettes  ? Quit date: 1999  ? Years since quitting: 24.3  ?Smokeless Tobacco Never  ? ? ?Goals Met:  ?Independence with exercise equipment ?Exercise tolerated well ?No report of concerns or symptoms today ?Strength training completed today ? ?Goals Unmet:  ?Not Applicable ? ?Comments: Pt able to follow exercise prescription today without complaint.  Will continue to monitor for progression. ? ? ? ?Dr. Emily Filbert is Medical Director for [place].  ?Dr. Ottie Glazier is Medical Director for [facility] Pulmonary Rehabilitation. ?

## 2021-07-31 NOTE — Progress Notes (Signed)
Pulmonary Individual Treatment Plan ? ?Patient Details  ?Name: Paige Hester ?MRN: [id number] ?Date of Birth: 1956/01/11 ?Referring Provider:   ?Flowsheet Row Pulmonary Rehab from 03/25/2021 in [facility] Cardiac and Pulmonary Rehab  ?Referring Provider Dr. Chesley Mires  ? ?  ? ? ?Initial Encounter Date:  ?Flowsheet Row Pulmonary Rehab from 03/25/2021 in [facility] Cardiac and Pulmonary Rehab  ?Date 03/25/21  ? ?  ? ? ?Visit Diagnosis: Dyspnea, unspecified type ? ?Patient's Home Medications on Admission: ? ?Current Outpatient Medications:  ?  albuterol (VENTOLIN HFA) 108 (90 Base) MCG/ACT inhaler, Inhale 2 puffs into the lungs every 6 (six) hours as needed for wheezing or shortness of breath., Disp: 8 g, Rfl: 2 ?  amLODipine (NORVASC) 10 MG tablet, Take 1 tablet by mouth once daily for blood pressure, Disp: 90 tablet, Rfl: 1 ?  Cholecalciferol (VITAMIN D3) 1000 UNITS tablet, Take 1,000 Units by mouth daily., Disp: , Rfl:  ?  docusate sodium (COLACE) 100 MG capsule, Take 1 capsule (100 mg total) by mouth 2 (two) times daily as needed for mild constipation or moderate constipation., Disp: 30 capsule, Rfl: 2 ?  fluticasone (FLONASE) 50 MCG/ACT nasal spray, Place 1 spray into both nostrils 2 (two) times daily as needed for allergies or rhinitis., Disp: 16 g, Rfl: 0 ?  loratadine (CLARITIN) 10 MG tablet, Take 10 mg by mouth daily., Disp: , Rfl:  ?  losartan-hydrochlorothiazide (HYZAAR) 100-25 MG tablet, Take 1 tablet by mouth once daily for blood pressure, Disp: 90 tablet, Rfl: 1 ?  Melatonin 10 MG TABS, Take by mouth as needed., Disp: , Rfl:  ?  omeprazole (PRILOSEC) 20 MG capsule, Take 20 mg by mouth every other day., Disp: , Rfl:  ?  rosuvastatin (CRESTOR) 20 MG tablet, Take 1 tablet (20 mg total) by mouth daily., Disp: 90 tablet, Rfl: 3 ?  Semaglutide,0.25 or 0.'5MG'$ /DOS, (OZEMPIC, 0.25 OR 0.5 MG/DOSE,) 2 MG/1.5ML SOPN, Inject 0.25 mg into the skin once a week., Disp: 1.5 mL, Rfl: 0 ? ?Past Medical History: ?Past Medical History:   ?Diagnosis Date  ? Allergy   ? Bilateral swelling of feet and ankles   ? Gallbladder polyp 2012  ? GERD (gastroesophageal reflux disease)   ? Hyperlipidemia   ? Hypertension   ? Iron deficiency anemia   ? Menopausal symptoms   ? since 2007  ? Murmur, cardiac   ? Other fatigue   ? Prediabetes   ? Pulmonary embolism ([place]) 06/2020  ? Shortness of breath   ? with exertion; patient going to pulmonary rehabilitation  ? Shortness of breath on exertion   ? Sleep apnea   ? Uses CPAP  ? Vitamin D deficiency   ? ? ?Tobacco Use: ?Social History  ? ?Tobacco Use  ?Smoking Status Former  ? Packs/day: 1.00  ? Years: 27.00  ? Pack years: 27.00  ? Types: Cigarettes  ? Quit date: 1999  ? Years since quitting: 24.3  ?Smokeless Tobacco Never  ? ? ?Labs: ?Review Flowsheet   ? ?  ?  Latest Ref Rng & Units 11/18/2019 10/18/2020 01/22/2021 02/12/2021  ?Labs for [facility] Cardiac and Pulmonary Rehab  ?Cholestrol 0 - 200 mg/dL 158   108   130   114    ?LDL (calc) 0 - 99 mg/dL 105   54   58   47    ?HDL-C >39.00 mg/dL 42.10   45.60   61   57.00    ?Trlycerides 0.0 - 149.0 mg/dL 52.0   43.0  44   46.0    ?Hemoglobin A1c 4.8 - 5.6 % 6.0   6.1   5.6     ?TCO2 22 - 32 mmol/L      ? ?  06/26/2021  ?Labs for [facility] Cardiac and Pulmonary Rehab  ?Cholestrol   ?LDL (calc)   ?HDL-C   ?Trlycerides   ?Hemoglobin A1c   ?TCO2 25    ?  ?  ?  ? ? ? ?Pulmonary Assessment Scores: ? Pulmonary Assessment Scores   ? ? [place] Name 03/25/21 1710  ?  ?  ?  ? ADL UCSD  ? SOB Score total 72    ? Rest 0    ? Walk 4    ? Stairs 5    ? Bath 2    ? Dress 3    ? Shop 4    ?  ? CAT Score  ? CAT Score 19    ?  ? mMRC Score  ? mMRC Score 3    ? ?  ?  ? ?  ?  ?UCSD: ?Self-administered rating of dyspnea associated with activities of daily living (ADLs) ?6-point scale (0 = "not at all" to 5 = "maximal or unable to do because of breathlessness")  ?Scoring Scores range from 0 to 120.  Minimally important difference is 5 units ? ?CAT: ?CAT can identify the health impairment of COPD patients and is  better correlated with disease progression.  ?CAT has a scoring range of zero to 40. The CAT score is classified into four groups of low (less than 10), medium (10 - 20), high (21-30) and very high (31-40) based on the impact level of disease on health status. A CAT score over 10 suggests significant symptoms.  A worsening CAT score could be explained by an exacerbation, poor medication adherence, poor inhaler technique, or progression of COPD or comorbid conditions.  ?CAT MCID is 2 points ? ?mMRC: ?mMRC (Modified Medical Research Council) Dyspnea Scale is used to assess the degree of baseline functional disability in patients of respiratory disease due to dyspnea. ?No minimal important difference is established. A decrease in score of 1 point or greater is considered a positive change.  ? ?Pulmonary Function Assessment: ? ? ?Exercise Target Goals: ?Exercise Program Goal: ?Individual exercise prescription set using results from initial 6 min walk test and THRR while considering  patient?s activity barriers and safety.  ? ?Exercise Prescription Goal: ?Initial exercise prescription builds to 30-45 minutes a day of aerobic activity, 2-3 days per week.  Home exercise guidelines will be given to patient during program as part of exercise prescription that the participant will acknowledge. ? ?Education: Aerobic Exercise: ?- Group verbal and visual presentation on the components of exercise prescription. Introduces F.I.T.T principle from ACSM for exercise prescriptions.  Reviews F.I.T.T. principles of aerobic exercise including progression. Written material given at graduation. ?Flowsheet Row Pulmonary Rehab from 07/24/2021 in [facility] Cardiac and Pulmonary Rehab  ?Date 07/17/21  ?Educator [place]  ?Instruction Review Code 1- Verbalizes Understanding  ? ?  ? ? ?Education: Resistance Exercise: ?- Group verbal and visual presentation on the components of exercise prescription. Introduces F.I.T.T principle from ACSM for exercise  prescriptions  Reviews F.I.T.T. principles of resistance exercise including progression. Written material given at graduation. ?Flowsheet Row Pulmonary Rehab from 07/24/2021 in [facility] Cardiac and Pulmonary Rehab  ?Date 07/24/21  ?Educator [facility]  ?Instruction Review Code 1- Verbalizes Understanding  ? ?  ? ?  ?Education: Exercise & Equipment Safety: ?- Individual verbal  instruction and demonstration of equipment use and safety with use of the equipment. ?Flowsheet Row Pulmonary Rehab from 07/24/2021 in [facility] Cardiac and Pulmonary Rehab  ?Date 03/25/21  ?Educator AS  ?Instruction Review Code 1- Verbalizes Understanding  ? ?  ? ? ?Education: Exercise Physiology & General Exercise Guidelines: ?- Group verbal and written instruction with models to review the exercise physiology of the cardiovascular system and associated critical values. Provides general exercise guidelines with specific guidelines to those with heart or lung disease.  ?Flowsheet Row Pulmonary Rehab from 07/24/2021 in [facility] Cardiac and Pulmonary Rehab  ?Date 05/08/21  ?Educator AS  ?Instruction Review Code 1- Verbalizes Understanding  ? ?  ? ? ?Education: Flexibility, Balance, Mind/Body Relaxation: ?- Group verbal and visual presentation with interactive activity on the components of exercise prescription. Introduces F.I.T.T principle from ACSM for exercise prescriptions. Reviews F.I.T.T. principles of flexibility and balance exercise training including progression. Also discusses the mind body connection.  Reviews various relaxation techniques to help reduce and manage stress (i.e. Deep breathing, progressive muscle relaxation, and visualization). Balance handout provided to take home. Written material given at graduation. ? ? ?Activity Barriers & Risk Stratification: ? Activity Barriers & Cardiac Risk Stratification - 02/25/21 1535   ? ?  ? Activity Barriers & Cardiac Risk Stratification  ? Activity Barriers Shortness of Breath   ? ?  ?  ? ?  ? ? ?6 Minute  Walk: ? 6 Minute Walk   ? ? [place] Name 03/25/21 1654  ?  ?  ?  ? 6 Minute Walk  ? Phase Initial    ? Distance 865 feet    ? Walk Time 6 minutes    ? # of Rest Breaks 0    ? MPH 1.64    ? METS 1.94    ? RPE 15    ?

## 2021-08-01 DIAGNOSIS — R0600 Dyspnea, unspecified: Secondary | ICD-10-CM | POA: Diagnosis not present

## 2021-08-01 NOTE — Progress Notes (Signed)
Daily Session Note  Patient Details  Name: Paige Hester MRN: [id number] Date of Birth: 08-11-1955 Referring Provider:   Flowsheet Row Pulmonary Rehab from 03/25/2021 in [facility] Cardiac and Pulmonary Rehab  Referring Provider Dr. Chesley Mires       Encounter Date: 08/01/2021  Check In:  Session Check In - 08/01/21 1543       Check-In   Supervising physician immediately available to respond to emergencies See telemetry face sheet for immediately available ER MD    Location [facility]-Cardiac & Pulmonary Rehab    Staff Present Vida Rigger, RN, BSN;Joseph [place], RCP,RRT,BSRT;Kara [place], MS, ASCM CEP, Exercise Physiologist    Virtual Visit No    Medication changes reported     No    Fall or balance concerns reported    No    Tobacco Cessation No Change    Warm-up and Cool-down Performed on first and last piece of equipment    Resistance Training Performed Yes    VAD Patient? No    PAD/SET Patient? No      Pain Assessment   Currently in Pain? No/denies                Social History   Tobacco Use  Smoking Status Former   Packs/day: 1.00   Years: 27.00   Pack years: 27.00   Types: Cigarettes   Quit date: 1999   Years since quitting: 24.3  Smokeless Tobacco Never    Goals Met:  Proper associated with RPD/PD & O2 Sat Independence with exercise equipment Using PLB without cueing & demonstrates good technique No report of concerns or symptoms today Strength training completed today  Goals Unmet:  Not Applicable  Comments: Pt able to follow exercise prescription today without complaint.  Will continue to monitor for progression.   Dr. Emily Filbert is Medical Director for [place].  Dr. Ottie Glazier is Medical Director for [facility] Pulmonary Rehabilitation.

## 2021-08-05 DIAGNOSIS — R0600 Dyspnea, unspecified: Secondary | ICD-10-CM | POA: Diagnosis not present

## 2021-08-05 NOTE — Progress Notes (Signed)
Daily Session Note  Patient Details  Name: Paige Hester MRN: [id number] Date of Birth: Jan 27, 1956 Referring Provider:   Flowsheet Row Pulmonary Rehab from 03/25/2021 in [facility] Cardiac and Pulmonary Rehab  Referring Provider Dr. Chesley Mires       Encounter Date: 08/05/2021  Check In:  Session Check In - 08/05/21 1529       Check-In   Supervising physician immediately available to respond to emergencies See telemetry face sheet for immediately available ER MD    Location [facility]-Cardiac & Pulmonary Rehab    Staff Present Birdie Sons, MPA, Nino Glow, MS, ASCM CEP, Exercise Physiologist;Joseph Tessie Fass, [place]    Virtual Visit No    Medication changes reported     No    Fall or balance concerns reported    No    Tobacco Cessation No Change    Warm-up and Cool-down Performed on first and last piece of equipment    Resistance Training Performed Yes    VAD Patient? No    PAD/SET Patient? No      Pain Assessment   Currently in Pain? No/denies                Social History   Tobacco Use  Smoking Status Former   Packs/day: 1.00   Years: 27.00   Pack years: 27.00   Types: Cigarettes   Quit date: 1999   Years since quitting: 24.4  Smokeless Tobacco Never    Goals Met:  Independence with exercise equipment Exercise tolerated well No report of concerns or symptoms today Strength training completed today  Goals Unmet:  Not Applicable  Comments: Pt able to follow exercise prescription today without complaint.  Will continue to monitor for progression.    Dr. Emily Filbert is Medical Director for [place].  Dr. Ottie Glazier is Medical Director for [facility] Pulmonary Rehabilitation.

## 2021-08-07 DIAGNOSIS — R0600 Dyspnea, unspecified: Secondary | ICD-10-CM

## 2021-08-07 NOTE — Progress Notes (Signed)
Daily Session Note  Patient Details  Name: Paige Hester MRN: [id number] Date of Birth: [birth year] Referring Provider:   Flowsheet Row Pulmonary Rehab from 03/25/2021 in [facility] Cardiac and Pulmonary Rehab  Referring Provider Dr. Chesley Mires       Encounter Date: 08/07/2021  Check In:  Session Check In - 08/07/21 1545       Check-In   Supervising physician immediately available to respond to emergencies See telemetry face sheet for immediately available ER MD    Location [facility]-Cardiac & Pulmonary Rehab    Staff Present Birdie Sons, MPA, Nino Glow, MS, ASCM CEP, Exercise Physiologist;Joseph Tessie Fass, [place]    Virtual Visit No    Medication changes reported     No    Fall or balance concerns reported    No    Tobacco Cessation No Change    Warm-up and Cool-down Performed on first and last piece of equipment    Resistance Training Performed Yes    VAD Patient? No    PAD/SET Patient? No      Pain Assessment   Currently in Pain? No/denies                Social History   Tobacco Use  Smoking Status Former   Packs/day: 1.00   Years: 27.00   Pack years: 27.00   Types: Cigarettes   Quit date: 1999   Years since quitting: 24.4  Smokeless Tobacco Never    Goals Met:  Independence with exercise equipment Exercise tolerated well No report of concerns or symptoms today Strength training completed today  Goals Unmet:  Not Applicable  Comments: Pt able to follow exercise prescription today without complaint.  Will continue to monitor for progression.    Dr. Emily Filbert is Medical Director for [place].  Dr. Ottie Glazier is Medical Director for [facility] Pulmonary Rehabilitation.

## 2021-08-08 ENCOUNTER — Encounter: Payer: Federal, State, Local not specified - PPO | Admitting: *Deleted

## 2021-08-08 DIAGNOSIS — R0600 Dyspnea, unspecified: Secondary | ICD-10-CM

## 2021-08-08 NOTE — Progress Notes (Signed)
Daily Session Note  Patient Details  Name: Paige Hester MRN: [id number] Date of Birth: 1955-05-08 Referring Provider:   Flowsheet Row Pulmonary Rehab from 03/25/2021 in [facility] Cardiac and Pulmonary Rehab  Referring Provider Dr. Chesley Mires       Encounter Date: 08/08/2021  Check In:  Session Check In - 08/08/21 1531       Check-In   Supervising physician immediately available to respond to emergencies See telemetry face sheet for immediately available ER MD    Location [facility]-Cardiac & Pulmonary Rehab    Staff Present Nyoka Cowden, RN, BSN, Willette Pa, MA, RCEP, CCRP, CCET;Joseph [place], [place]    Virtual Visit No    Medication changes reported     No    Fall or balance concerns reported    No    Tobacco Cessation No Change    Warm-up and Cool-down Performed on first and last piece of equipment    Resistance Training Performed Yes    VAD Patient? No    PAD/SET Patient? No      Pain Assessment   Currently in Pain? No/denies                Social History   Tobacco Use  Smoking Status Former   Packs/day: 1.00   Years: 27.00   Pack years: 27.00   Types: Cigarettes   Quit date: 1999   Years since quitting: 24.4  Smokeless Tobacco Never    Goals Met:  Independence with exercise equipment Exercise tolerated well No report of concerns or symptoms today  Goals Unmet:  Not Applicable  Comments: Pt able to follow exercise prescription today without complaint.  Will continue to monitor for progression.    Dr. Emily Filbert is Medical Director for [place].  Dr. Ottie Glazier is Medical Director for [facility] Pulmonary Rehabilitation.

## 2021-08-14 ENCOUNTER — Encounter: Payer: Federal, State, Local not specified - PPO | Admitting: *Deleted

## 2021-08-14 DIAGNOSIS — R0600 Dyspnea, unspecified: Secondary | ICD-10-CM

## 2021-08-14 NOTE — Progress Notes (Signed)
Daily Session Note  Patient Details  Name: Paige Hester MRN: [id number] Date of Birth: 09-22-1955 Referring Provider:   Flowsheet Row Pulmonary Rehab from 03/25/2021 in [facility] Cardiac and Pulmonary Rehab  Referring Provider Dr. Chesley Mires       Encounter Date: 08/14/2021  Check In:  Session Check In - 08/14/21 1555       Check-In   Supervising physician immediately available to respond to emergencies See telemetry face sheet for immediately available ER MD    Location [facility]Cardiac & Pulmonary Rehab    Staff Present Nyoka Cowden, RN, BSN, Tyna Jaksch, MS, ASCM CEP, Exercise Physiologist;Joseph Tessie Fass, [place]    Virtual Visit No    Medication changes reported     No    Fall or balance concerns reported    No    Tobacco Cessation No Change    Warm-up and Cool-down Performed on first and last piece of equipment    Resistance Training Performed Yes    VAD Patient? No    PAD/SET Patient? No      Pain Assessment   Currently in Pain? No/denies                Social History   Tobacco Use  Smoking Status Former   Packs/day: 1.00   Years: 27.00   Pack years: 27.00   Types: Cigarettes   Quit date: 1999   Years since quitting: 24.4  Smokeless Tobacco Never    Goals Met:  Independence with exercise equipment Exercise tolerated well No report of concerns or symptoms today  Goals Unmet:  Not Applicable  Comments: Pt able to follow exercise prescription today without complaint.  Will continue to monitor for progression.    Dr. Emily Filbert is Medical Director for [place].  Dr. Ottie Glazier is Medical Director for [facility] Pulmonary Rehabilitation.

## 2021-08-19 ENCOUNTER — Encounter: Payer: Medicare Other | Attending: Pulmonary Disease

## 2021-08-19 DIAGNOSIS — R0600 Dyspnea, unspecified: Secondary | ICD-10-CM | POA: Insufficient documentation

## 2021-08-19 NOTE — Progress Notes (Signed)
Daily Session Note  Patient Details  Name: Paige Hester MRN: [id number] Date of Birth: 02/01/1956 Referring Provider:   Flowsheet Row Pulmonary Rehab from 03/25/2021 in [facility] Cardiac and Pulmonary Rehab  Referring Provider Dr. Chesley Mires       Encounter Date: 08/19/2021  Check In:  Session Check In - 08/19/21 1540       Check-In   Supervising physician immediately available to respond to emergencies See telemetry face sheet for immediately available ER MD    Location [facility]-Cardiac & Pulmonary Rehab    Staff Present Birdie Sons, MPA, Nino Glow, MS, ASCM CEP, Exercise Physiologist;Joseph Tessie Fass, [place]    Virtual Visit No    Medication changes reported     No    Fall or balance concerns reported    No    Tobacco Cessation No Change    Warm-up and Cool-down Performed on first and last piece of equipment    Resistance Training Performed Yes    VAD Patient? No    PAD/SET Patient? No      Pain Assessment   Currently in Pain? No/denies                Social History   Tobacco Use  Smoking Status Former   Packs/day: 1.00   Years: 27.00   Pack years: 27.00   Types: Cigarettes   Quit date: 1999   Years since quitting: 24.4  Smokeless Tobacco Never    Goals Met:  Independence with exercise equipment Exercise tolerated well No report of concerns or symptoms today Strength training completed today  Goals Unmet:  Not Applicable  Comments: Pt able to follow exercise prescription today without complaint.  Will continue to monitor for progression.    Dr. Emily Filbert is Medical Director for [place].  Dr. Ottie Glazier is Medical Director for [facility] Pulmonary Rehabilitation.

## 2021-08-21 DIAGNOSIS — R0600 Dyspnea, unspecified: Secondary | ICD-10-CM | POA: Diagnosis not present

## 2021-08-21 NOTE — Progress Notes (Signed)
Daily Session Note  Patient Details  Name: Paige Hester MRN: [id number] Date of Birth: 1955-05-30 Referring Provider:   Flowsheet Row Pulmonary Rehab from 03/25/2021 in [facility] Cardiac and Pulmonary Rehab  Referring Provider Dr. Chesley Mires       Encounter Date: 08/21/2021  Check In:  Session Check In - 08/21/21 1530       Check-In   Supervising physician immediately available to respond to emergencies See telemetry face sheet for immediately available ER MD    Location [facility]-Cardiac & Pulmonary Rehab    Staff Present Coralie Keens, MS, ASCM CEP, Exercise Physiologist;Joseph Karie Fetch, MPA, RN    Virtual Visit No    Medication changes reported     No    Fall or balance concerns reported    No    Tobacco Cessation No Change    Warm-up and Cool-down Performed on first and last piece of equipment    Resistance Training Performed Yes    VAD Patient? No    PAD/SET Patient? No      Pain Assessment   Currently in Pain? No/denies                Social History   Tobacco Use  Smoking Status Former   Packs/day: 1.00   Years: 27.00   Pack years: 27.00   Types: Cigarettes   Quit date: 1999   Years since quitting: 24.4  Smokeless Tobacco Never    Goals Met:  Independence with exercise equipment Exercise tolerated well No report of concerns or symptoms today Strength training completed today  Goals Unmet:  Not Applicable  Comments: Pt able to follow exercise prescription today without complaint.  Will continue to monitor for progression.    Dr. Emily Filbert is Medical Director for [place].  Dr. Ottie Glazier is Medical Director for [facility] Pulmonary Rehabilitation.

## 2021-08-26 ENCOUNTER — Encounter: Payer: Medicare Other | Admitting: *Deleted

## 2021-08-26 VITALS — Ht 65.75 in | Wt 277.0 lb

## 2021-08-26 DIAGNOSIS — R0600 Dyspnea, unspecified: Secondary | ICD-10-CM

## 2021-08-26 NOTE — Progress Notes (Signed)
Daily Session Note  Patient Details  Name: Paige Hester MRN: [id number] Date of Birth: 29-Oct-1955 Referring Provider:   Flowsheet Row Pulmonary Rehab from 03/25/2021 in [facility] Cardiac and Pulmonary Rehab  Referring Provider Dr. Chesley Mires       Encounter Date: 08/26/2021  Check In:  Session Check In - 08/26/21 1625       Check-In   Supervising physician immediately available to respond to emergencies See telemetry face sheet for immediately available ER MD    Location [facility]-Cardiac & Pulmonary Rehab    Staff Present Heath Lark, RN, BSN, CCRP;Joseph [place], RCP,RRT,BSRT;Kelly Bush[place], [place], ACSM CEP, Exercise Physiologist    Virtual Visit No    Medication changes reported     No    Fall or balance concerns reported    No    Warm-up and Cool-down Performed on first and last piece of equipment    Resistance Training Performed Yes    VAD Patient? No    PAD/SET Patient? No      Pain Assessment   Currently in Pain? No/denies              6 Minute Walk     Row Name 03/25/21 1654 08/26/21 1555       6 Minute Walk   Phase Initial Discharge    Distance 865 feet 1250 feet    Distance % Change -- 44.5 %    Distance Feet Change -- 385 ft    Walk Time 6 minutes 6 minutes    # of Rest Breaks 0 0    MPH 1.64 2.37    METS 1.94 2.18    RPE 15 13    Perceived Dyspnea  3 3    VO2 Peak 6.79 7.64    Symptoms Yes (comment) Yes (comment)    Comments Leg tiredness SOB    Resting HR 84 bpm 80 bpm    Resting BP 138/80 92/56    Resting Oxygen Saturation  97 % 98 %    Exercise Oxygen Saturation  during 6 min walk 95 % 95 %    Max Ex. HR 98 bpm 103 bpm    Max Ex. BP 228/88 148/74    2 Minute Post BP 164/88 126/72      Interval HR   1 Minute HR 93 88    2 Minute HR 92 93    3 Minute HR 98 97    4 Minute HR 96 100    5 Minute HR 96 101    6 Minute HR 98 103    2 Minute Post HR 85 94    Interval Heart Rate? Yes Yes      Interval Oxygen   Interval Oxygen? Yes Yes    Baseline  Oxygen Saturation % 97 % 98 %    1 Minute Oxygen Saturation % 95 % 96 %    1 Minute Liters of Oxygen 0 L 0 L  Room Air    2 Minute Oxygen Saturation % 95 % 96 %    2 Minute Liters of Oxygen 0 L 0 L    3 Minute Oxygen Saturation % 95 % 96 %    3 Minute Liters of Oxygen 0 L 0 L    4 Minute Oxygen Saturation % 95 % 96 %    4 Minute Liters of Oxygen 0 L 0 L    5 Minute Oxygen Saturation % 95 % 95 %    5 Minute Liters of  Oxygen 0 L 0 L    6 Minute Oxygen Saturation % 95 % 95 %    6 Minute Liters of Oxygen 0 L 0 L    2 Minute Post Oxygen Saturation % 95 % 96 %    2 Minute Post Liters of Oxygen 0 L 0 L                Social History   Tobacco Use  Smoking Status Former   Packs/day: 1.00   Years: 27.00   Total pack years: 27.00   Types: Cigarettes   Quit date: 1999   Years since quitting: 24.4  Smokeless Tobacco Never    Goals Met:  Proper associated with RPD/PD & O2 Sat Independence with exercise equipment Exercise tolerated well No report of concerns or symptoms today  Goals Unmet:  Not Applicable  Comments: Pt able to follow exercise prescription today without complaint.  Will continue to monitor for progression.    Dr. Emily Filbert is Medical Director for [place].  Dr. Ottie Glazier is Medical Director for [facility] Pulmonary Rehabilitation.

## 2021-08-28 ENCOUNTER — Encounter: Payer: Medicare Other | Admitting: *Deleted

## 2021-08-28 ENCOUNTER — Encounter: Payer: Self-pay | Admitting: *Deleted

## 2021-08-28 DIAGNOSIS — R0600 Dyspnea, unspecified: Secondary | ICD-10-CM

## 2021-08-28 NOTE — Patient Instructions (Signed)
Discharge Patient Instructions  Patient Details  Name: Paige Hester MRN: [id number] Date of Birth: 07/22/1955 Referring Provider:  Pleas Koch, NP   Number of Visits: 36  Reason for Discharge:  Patient reached a stable level of exercise. Patient independent in their exercise. Patient has met program and personal goals.  Smoking History:  Social History   Tobacco Use  Smoking Status Former   Packs/day: 1.00   Years: 27.00   Total pack years: 27.00   Types: Cigarettes   Quit date: 1999   Years since quitting: 24.4  Smokeless Tobacco Never    Diagnosis:  Dyspnea, unspecified type  Initial Exercise Prescription:  Initial Exercise Prescription - 03/25/21 1600       Date of Initial Exercise RX and Referring Provider   Date 03/25/21    Referring Provider Dr. Chesley Mires      Oxygen   Maintain Oxygen Saturation 88% or higher      Treadmill   MPH 1.2    Grade 0    Minutes 15    METs 1.92      T5 Nustep   Level 1    SPM 80    Minutes 15    METs 1.94      Biostep-RELP   Level 1    SPM 50    Minutes 15    METs 1.94      Track   Laps 10    Minutes 15    METs 1.54      Prescription Details   Frequency (times per week) 3    Duration Progress to 30 minutes of continuous aerobic without signs/symptoms of physical distress      Intensity   THRR 40-80% of Max Heartrate 112-140    Ratings of Perceived Exertion 11-13    Perceived Dyspnea 0-4      Progression   Progression Continue to progress workloads to maintain intensity without signs/symptoms of physical distress.      Resistance Training   Training Prescription Yes    Weight 3    Reps 10-15             Discharge Exercise Prescription (Final Exercise Prescription Changes):  Exercise Prescription Changes - 08/28/21 1100       Response to Exercise   Blood Pressure (Admit) 120/64    Blood Pressure (Exit) 100/58    Heart Rate (Admit) 76 bpm    Heart Rate (Exercise) 93 bpm     Heart Rate (Exit) 88 bpm    Oxygen Saturation (Admit) 98 %    Oxygen Saturation (Exercise) 96 %    Oxygen Saturation (Exit) 98 %    Rating of Perceived Exertion (Exercise) 14    Perceived Dyspnea (Exercise) 1    Symptoms none    Duration Continue with 30 min of aerobic exercise without signs/symptoms of physical distress.    Intensity THRR unchanged      Progression   Progression Continue to progress workloads to maintain intensity without signs/symptoms of physical distress.    Average METs 2.69      Resistance Training   Training Prescription Yes    Weight 5 lb    Reps 10-15      Interval Training   Interval Training No      Treadmill   MPH 1.5    Grade 0    Minutes 15    METs 2.15      Recumbant Elliptical   Level 3    Minutes 15  METs 2.1      Biostep-RELP   Level 2    Minutes 15    METs 4      Track   Minutes 15      Home Exercise Plan   Plans to continue exercise at Home (comment)   walking   Frequency Add 2 additional days to program exercise sessions.    Initial Home Exercises Provided 04/22/21      Oxygen   Maintain Oxygen Saturation 88% or higher             Functional Capacity:  6 Minute Walk     Row Name 03/25/21 1654 08/26/21 1555       6 Minute Walk   Phase Initial Discharge    Distance 865 feet 1250 feet    Distance % Change -- 44.5 %    Distance Feet Change -- 385 ft    Walk Time 6 minutes 6 minutes    # of Rest Breaks 0 0    MPH 1.64 2.37    METS 1.94 2.18    RPE 15 13    Perceived Dyspnea  3 3    VO2 Peak 6.79 7.64    Symptoms Yes (comment) Yes (comment)    Comments Leg tiredness SOB    Resting HR 84 bpm 80 bpm    Resting BP 138/80 92/56    Resting Oxygen Saturation  97 % 98 %    Exercise Oxygen Saturation  during 6 min walk 95 % 95 %    Max Ex. HR 98 bpm 103 bpm    Max Ex. BP 228/88 148/74    2 Minute Post BP 164/88 126/72      Interval HR   1 Minute HR 93 88    2 Minute HR 92 93    3 Minute HR 98 97    4  Minute HR 96 100    5 Minute HR 96 101    6 Minute HR 98 103    2 Minute Post HR 85 94    Interval Heart Rate? Yes Yes      Interval Oxygen   Interval Oxygen? Yes Yes    Baseline Oxygen Saturation % 97 % 98 %    1 Minute Oxygen Saturation % 95 % 96 %    1 Minute Liters of Oxygen 0 L 0 L  Room Air    2 Minute Oxygen Saturation % 95 % 96 %    2 Minute Liters of Oxygen 0 L 0 L    3 Minute Oxygen Saturation % 95 % 96 %    3 Minute Liters of Oxygen 0 L 0 L    4 Minute Oxygen Saturation % 95 % 96 %    4 Minute Liters of Oxygen 0 L 0 L    5 Minute Oxygen Saturation % 95 % 95 %    5 Minute Liters of Oxygen 0 L 0 L    6 Minute Oxygen Saturation % 95 % 95 %    6 Minute Liters of Oxygen 0 L 0 L    2 Minute Post Oxygen Saturation % 95 % 96 %    2 Minute Post Liters of Oxygen 0 L 0 L               Nutrition & Weight - Outcomes:  Pre Biometrics - 03/25/21 1704       Pre Biometrics   Height 5' 5.75" (1.67 m)  Weight 288 lb 1.6 oz (130.7 kg)    BMI (Calculated) 46.86    Single Leg Stand 8.97 seconds             Post Biometrics - 08/26/21 1557        Post  Biometrics   Height 5' 5.75" (1.67 m)    Weight 277 lb (125.6 kg)    BMI (Calculated) 45.05    Single Leg Stand 6.91 seconds                Goals reviewed with patient; copy given to patient.

## 2021-08-28 NOTE — Telephone Encounter (Signed)
Ok to add back to list

## 2021-08-28 NOTE — Progress Notes (Signed)
Daily Session Note  Patient Details  Name: Paige Hester MRN: [id number] Date of Birth: Jun 21, 1955 Referring Provider:   Flowsheet Row Pulmonary Rehab from 03/25/2021 in [facility] Cardiac and Pulmonary Rehab  Referring Provider Dr. Chesley Mires       Encounter Date: 08/28/2021  Check In:  Session Check In - 08/28/21 1609       Check-In   Supervising physician immediately available to respond to emergencies See telemetry face sheet for immediately available ER MD    Location [facility]-Cardiac & Pulmonary Rehab    Staff Present Heath Lark, RN, BSN, CCRP;Melissa [place], RDN, Tawanna Solo, MS, ASCM CEP, Exercise Physiologist    Virtual Visit No    Medication changes reported     No    Fall or balance concerns reported    No    Warm-up and Cool-down Performed on first and last piece of equipment    Resistance Training Performed Yes    VAD Patient? No    PAD/SET Patient? No                Social History   Tobacco Use  Smoking Status Former   Packs/day: 1.00   Years: 27.00   Total pack years: 27.00   Types: Cigarettes   Quit date: 1999   Years since quitting: 24.4  Smokeless Tobacco Never    Goals Met:  Proper associated with RPD/PD & O2 Sat Independence with exercise equipment Exercise tolerated well No report of concerns or symptoms today  Goals Unmet:  Not Applicable  Comments: Pt able to follow exercise prescription today without complaint.  Will continue to monitor for progression.    Dr. Emily Filbert is Medical Director for [place].  Dr. Ottie Glazier is Medical Director for [facility] Pulmonary Rehabilitation.
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
## 2021-08-29 DIAGNOSIS — R0600 Dyspnea, unspecified: Secondary | ICD-10-CM

## 2021-08-29 NOTE — Progress Notes (Signed)
Daily Session Note  Patient Details  Name: Paige Hester MRN: [id number] Date of Birth: 05/18/1955 Referring Provider:   Flowsheet Row Pulmonary Rehab from 03/25/2021 in [facility] Cardiac and Pulmonary Rehab  Referring Provider Dr. Chesley Mires       Encounter Date: 08/29/2021  Check In:  Session Check In - 08/29/21 1608       Check-In   Supervising physician immediately available to respond to emergencies See telemetry face sheet for immediately available ER MD    Location [facility]-Cardiac & Pulmonary Rehab    Staff Present Vida Rigger, RN, BSN;Joseph [place], RCP,RRT,BSRT;Jessica [place], [place], RCEP, CCRP, CCET    Virtual Visit No    Medication changes reported     No    Fall or balance concerns reported    No    Tobacco Cessation No Change    Warm-up and Cool-down Performed on first and last piece of equipment    Resistance Training Performed Yes    VAD Patient? No    PAD/SET Patient? No      Pain Assessment   Currently in Pain? No/denies                Social History   Tobacco Use  Smoking Status Former   Packs/day: 1.00   Years: 27.00   Total pack years: 27.00   Types: Cigarettes   Quit date: 1999   Years since quitting: 24.4  Smokeless Tobacco Never    Goals Met:  Proper associated with RPD/PD & O2 Sat Independence with exercise equipment Using PLB without cueing & demonstrates good technique Exercise tolerated well No report of concerns or symptoms today Strength training completed today  Goals Unmet:  Not Applicable  Comments: Pt able to follow exercise prescription today without complaint.  Will continue to monitor for progression.   Dr. Emily Filbert is Medical Director for [place].  Dr. Ottie Glazier is Medical Director for [facility] Pulmonary Rehabilitation.

## 2021-09-02 ENCOUNTER — Other Ambulatory Visit: Payer: Self-pay

## 2021-09-02 DIAGNOSIS — R0600 Dyspnea, unspecified: Secondary | ICD-10-CM | POA: Diagnosis not present

## 2021-09-02 NOTE — Telephone Encounter (Signed)
Updated no further action needed.

## 2021-09-02 NOTE — Progress Notes (Signed)
Daily Session Note  Patient Details  Name: Paige Hester MRN: [id number] Date of Birth: Jul 28, 1955 Referring Provider:   Flowsheet Row Pulmonary Rehab from 03/25/2021 in [facility] Cardiac and Pulmonary Rehab  Referring Provider Dr. Chesley Mires       Encounter Date: 09/02/2021  Check In:  Session Check In - 09/02/21 1536       Check-In   Supervising physician immediately available to respond to emergencies See telemetry face sheet for immediately available ER MD    Location [facility]-Cardiac & Pulmonary Rehab    Staff Present Earlean Shawl, BS, ACSM CEP, Exercise Physiologist;Joseph Tessie Fass, Fabio Neighbors, MPA, RN    Virtual Visit No    Medication changes reported     No    Fall or balance concerns reported    No    Tobacco Cessation No Change    Warm-up and Cool-down Performed on first and last piece of equipment    Resistance Training Performed Yes    VAD Patient? No    PAD/SET Patient? No      Pain Assessment   Currently in Pain? No/denies                Social History   Tobacco Use  Smoking Status Former   Packs/day: 1.00   Years: 27.00   Total pack years: 27.00   Types: Cigarettes   Quit date: 1999   Years since quitting: 24.4  Smokeless Tobacco Never    Goals Met:  Independence with exercise equipment Exercise tolerated well No report of concerns or symptoms today Strength training completed today  Goals Unmet:  Not Applicable  Comments: Pt able to follow exercise prescription today without complaint.  Will continue to monitor for progression.    Dr. Emily Filbert is Medical Director for [place].  Dr. Ottie Glazier is Medical Director for [facility] Pulmonary Rehabilitation.

## 2021-09-04 DIAGNOSIS — R0600 Dyspnea, unspecified: Secondary | ICD-10-CM

## 2021-09-04 NOTE — Progress Notes (Signed)
Daily Session Note  Patient Details  Name: Paige Hester MRN: [id number] Date of Birth: [birth year] Referring Provider:   Flowsheet Row Pulmonary Rehab from 03/25/2021 in [facility] Cardiac and Pulmonary Rehab  Referring Provider Dr. Chesley Mires       Encounter Date: 09/04/2021  Check In:  Session Check In - 09/04/21 1533       Check-In   Supervising physician immediately available to respond to emergencies See telemetry face sheet for immediately available ER MD    Location [facility]-Cardiac & Pulmonary Rehab    Staff Present Justin Mend, RCP,RRT,BSRT;Anacarolina Evelyn, MPA, RN;Susanne Bice, RN, BSN, CCRP    Virtual Visit No    Medication changes reported     No    Fall or balance concerns reported    No    Tobacco Cessation No Change    Warm-up and Cool-down Performed on first and last piece of equipment    Resistance Training Performed Yes    VAD Patient? No    PAD/SET Patient? No      Pain Assessment   Currently in Pain? No/denies                Social History   Tobacco Use  Smoking Status Former   Packs/day: 1.00   Years: 27.00   Total pack years: 27.00   Types: Cigarettes   Quit date: 1999   Years since quitting: 24.4  Smokeless Tobacco Never    Goals Met:  Independence with exercise equipment Exercise tolerated well No report of concerns or symptoms today Strength training completed today  Goals Unmet:  Not Applicable  Comments: Pt able to follow exercise prescription today without complaint.  Will continue to monitor for progression.    Dr. Emily Filbert is Medical Director for [place].  Dr. Ottie Glazier is Medical Director for [facility] Pulmonary Rehabilitation.

## 2021-09-05 ENCOUNTER — Encounter: Payer: Medicare Other | Admitting: *Deleted

## 2021-09-05 DIAGNOSIS — R0600 Dyspnea, unspecified: Secondary | ICD-10-CM
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
## 2021-09-05 NOTE — Progress Notes (Signed)
Discharge note for Paige Hester, DOB 19-Mar-1955  [name] graduated today from  rehab with 36 sessions completed.  Details of the patient's exercise prescription and what She needs to do in order to continue the prescription and progress were discussed with patient.  Patient was given a copy of prescription and goals.  Patient verbalized understanding.  Austine plans to continue to exercise by joining the [facility].    [place] Name 03/25/21 1654 08/26/21 1555       6 Minute Walk   Phase Initial Discharge    Distance 865 feet 1250 feet    Distance % Change -- 44.5 %    Distance Feet Change -- 385 ft    Walk Time 6 minutes 6 minutes    # of Rest Breaks 0 0    MPH 1.64 2.37    METS 1.94 2.18    RPE 15 13    Perceived Dyspnea  3 3    VO2 Peak 6.79 7.64    Symptoms Yes (comment) Yes (comment)    Comments Leg tiredness SOB    Resting HR 84 bpm 80 bpm    Resting BP 138/80 92/56    Resting Oxygen Saturation  97 % 98 %    Exercise Oxygen Saturation  during 6 min walk 95 % 95 %    Max Ex. HR 98 bpm 103 bpm    Max Ex. BP 228/88 148/74    2 Minute Post BP 164/88 126/72      Interval HR   1 Minute HR 93 88    2 Minute HR 92 93    3 Minute HR 98 97    4 Minute HR 96 100    5 Minute HR 96 101    6 Minute HR 98 103    2 Minute Post HR 85 94    Interval Heart Rate? Yes Yes      Interval Oxygen   Interval Oxygen? Yes Yes    Baseline Oxygen Saturation % 97 % 98 %    1 Minute Oxygen Saturation % 95 % 96 %    1 Minute Liters of Oxygen 0 L 0 L  Room Air    2 Minute Oxygen Saturation % 95 % 96 %    2 Minute Liters of Oxygen 0 L 0 L    3 Minute Oxygen Saturation % 95 % 96 %    3 Minute Liters of Oxygen 0 L 0 L    4 Minute Oxygen Saturation % 95 % 96 %    4 Minute Liters of Oxygen 0 L 0 L    5 Minute Oxygen Saturation % 95 % 95 %    5 Minute Liters of Oxygen 0 L 0 L    6 Minute Oxygen Saturation % 95 % 95 %    6 Minute Liters of Oxygen 0 L 0 L    2 Minute Post Oxygen  Saturation % 95 % 96 %    2 Minute Post Liters of Oxygen 0 L 0 L            Thank you for the referral. Pleasure to work with.
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
## 2021-09-27 NOTE — Telephone Encounter (Signed)
error 

## 2021-09-30 ENCOUNTER — Telehealth: Payer: Self-pay | Admitting: Cardiovascular Disease

## 2021-09-30 MED ORDER — ROSUVASTATIN CALCIUM 20 MG PO TABS: 20.0000 mg | ORAL_TABLET | Freq: Every day | ORAL | 1 refills | Status: DC

## 2021-09-30 NOTE — Telephone Encounter (Signed)
Requested Prescriptions   Signed Prescriptions Disp Refills   rosuvastatin (CRESTOR) 20 MG tablet 90 tablet 1    Sig: Take 1 tablet (20 mg total) by mouth daily.    Authorizing Provider: Minna Merritts    Ordering User: Raelene Bott, Mikale Silversmith L

## 2021-09-30 NOTE — Telephone Encounter (Signed)
*  STAT* If patient is at the pharmacy, call can be transferred to refill team.   1. Which medications need to be refilled? (please list name of each medication and dose if known) Rosuvastatin  2. Which pharmacy/location (including street and city if local pharmacy) is medication to be sent to? [place]  3. Do they need a 30 day or 90 day supply? 90 days and refills

## 2021-10-11 ENCOUNTER — Other Ambulatory Visit: Payer: Self-pay | Admitting: Primary Care

## 2021-10-11 DIAGNOSIS — I10 Essential (primary) hypertension: Secondary | ICD-10-CM

## 2021-10-11 NOTE — Telephone Encounter (Signed)
Patient is due for CPE/follow up, this will be required prior to any further refills.  Please schedule.   

## 2021-10-14 NOTE — Telephone Encounter (Signed)
Patient was scheduled for a physical on 10/30/2021 at 2:00

## 2021-10-23 ENCOUNTER — Encounter (INDEPENDENT_AMBULATORY_CARE_PROVIDER_SITE_OTHER): Payer: Self-pay

## 2021-10-30 ENCOUNTER — Ambulatory Visit (INDEPENDENT_AMBULATORY_CARE_PROVIDER_SITE_OTHER): Payer: Medicare Other | Admitting: Primary Care

## 2021-10-30 ENCOUNTER — Encounter: Payer: Self-pay | Admitting: Primary Care

## 2021-10-30 VITALS — BP 124/78 | HR 61 | Temp 99.0°F | Ht 65.75 in | Wt 272.0 lb

## 2021-10-30 DIAGNOSIS — I10 Essential (primary) hypertension: Secondary | ICD-10-CM

## 2021-10-30 DIAGNOSIS — G4733 Obstructive sleep apnea (adult) (pediatric): Secondary | ICD-10-CM

## 2021-10-30 DIAGNOSIS — K219 Gastro-esophageal reflux disease without esophagitis: Secondary | ICD-10-CM

## 2021-10-30 DIAGNOSIS — I700 Atherosclerosis of aorta: Secondary | ICD-10-CM | POA: Diagnosis not present

## 2021-10-30 DIAGNOSIS — Z1231 Encounter for screening mammogram for malignant neoplasm of breast: Secondary | ICD-10-CM

## 2021-10-30 DIAGNOSIS — E7800 Pure hypercholesterolemia, unspecified: Secondary | ICD-10-CM

## 2021-10-30 DIAGNOSIS — R7303 Prediabetes: Secondary | ICD-10-CM

## 2021-10-30 DIAGNOSIS — Z0000 Encounter for general adult medical examination without abnormal findings: Secondary | ICD-10-CM

## 2021-10-30 DIAGNOSIS — R0600 Dyspnea, unspecified: Secondary | ICD-10-CM

## 2021-10-30 NOTE — Assessment & Plan Note (Signed)
Controlled.  Continue amlodipine 10 mg daily, losartan-hydrochlorothiazide 100-25 mg daily. CMP pending.

## 2021-10-30 NOTE — Assessment & Plan Note (Signed)
Not wearing CPAP machine secondary to insurance issues.  She is working to get her CPAP machine back.  Discussed to contact her pulmonology provider.

## 2021-10-30 NOTE — Assessment & Plan Note (Signed)
Commended her on regular exercise.  Repeat A1C pending. 

## 2021-10-30 NOTE — Patient Instructions (Addendum)
Stop by the lab prior to leaving today. I will notify you of your results once received.   It was a pleasure to see you today!    '[x]'$   [facility]  [place] [address]  [phone]  '[]'$   [place] at [facility])   [address] Room [place], [place] [address]  [phone]  '[]'$   [facility]      [address] [place], [place]         '[]'$   [facility]  [place], [place]  '[]'$  [facility]   [address], [place] [address]  '[]'$  [place]  [address] [place], [place] [address] [phone]    Make sure to wear two piece clothing  No lotions powders or deodorants the day of the appointment Make sure to bring picture ID and insurance card.  Bring list of medications you are currently taking including any supplements.   Schedule your screening mammogram through MyChart!   Select [place] imaging sites can now be scheduled through [place].  Log into your MyChart account.  Go to 'Visit' (or 'Appointments' if  on mobile App) --> Schedule an  Appointment  Under 'Select a Reason for Visit' choose the Mammogram  Screening option.  Complete the pre-visit questions  and select the time and place that  best fits your schedule

## 2021-10-30 NOTE — Assessment & Plan Note (Signed)
Repeat lipid panel pending. LDL goal is <70. Continue rosuvastatin 20 mg daily.

## 2021-10-30 NOTE — Assessment & Plan Note (Signed)
Improved! Completed pulmonary rehab.   Encouraged continued exercise, swimming, walking. Continue to monitor.

## 2021-10-30 NOTE — Assessment & Plan Note (Signed)
Pneumovax 23 due, provided today, other vaccines UTD. Mammogram UTD. Bone density scan UTD. Colonoscopy UTD due 2027.  Discussed the importance of a healthy diet and regular exercise in order for weight loss, and to reduce the risk of further co-morbidity.  Exam stable. Labs pending.  Follow up in 1 year for repeat physical.

## 2021-10-30 NOTE — Progress Notes (Signed)
Subjective:    Patient ID: Paige Hester, female    DOB: 08/07/1955, 66 y.o.   MRN: [id number]  HPI  Paige Hester is a very pleasant 66 y.o. female who presents today for complete physical and follow up of chronic conditions.  Immunizations: -Tetanus: 2014 -Influenza: Due this season -Covid-19: 3 vaccines -Shingles: Completed Shingrix series -Pneumonia: Pneumovax 20 in 2022  Diet: [place].  Exercise: Swimming three times weekly. Finished pulmonary rehab. Plans on starting at [facility].   Eye exam: Completed last year.  Dental exam: Completed in June 2023  Mammogram: Completed in December 2022  Colonoscopy: Completed in 2020, due July 2027 Dexa: Completed in 2022  BP Readings from Last 3 Encounters:  10/30/21 124/78  07/25/21 130/70  07/15/21 120/60        Review of Systems  Constitutional:  Negative for unexpected weight change.  HENT:  Negative for rhinorrhea.   Respiratory:  Negative for cough and shortness of breath.   Cardiovascular:  Negative for chest pain.  Gastrointestinal:  Negative for constipation and diarrhea.  Genitourinary:  Negative for difficulty urinating.  Musculoskeletal:  Negative for arthralgias and myalgias.  Skin:  Negative for rash.  Allergic/Immunologic: Negative for environmental allergies.  Neurological:  Negative for dizziness and headaches.  Psychiatric/Behavioral:  The patient is not nervous/anxious.          Past Medical History:  Diagnosis Date   Allergy    Bilateral swelling of feet and ankles    Gallbladder polyp 2012   GERD (gastroesophageal reflux disease)    Hyperlipidemia    Hypertension    Iron deficiency anemia    Menopausal symptoms    since 2007   Murmur, cardiac    Other fatigue    Prediabetes    Pulmonary embolism (HCC) 06/2020   Shortness of breath    with exertion; patient going to pulmonary rehabilitation   Shortness of breath on exertion    Sleep apnea    Uses CPAP   Vitamin  D deficiency     Social History   Socioeconomic History   Marital status: Legally Separated    Spouse name: Not on file   Number of children: 1   Years of education: Not on file   Highest education level: Not on file  Occupational History   Occupation: Retired  Tobacco Use   Smoking status: Former    Packs/day: 1.00    Years: 27.00    Total pack years: 27.00    Types: Cigarettes    Quit date: 1999    Years since quitting: 24.6   Smokeless tobacco: Never  Vaping Use   Vaping Use: Never used  Substance and Sexual Activity   Alcohol use: Yes    Alcohol/week: 7.0 standard drinks of alcohol    Types: 7 Glasses of wine per week    Comment: 1-2 glasses of wine per night   Drug use: No   Sexual activity: Yes  Other Topics Concern   Not on file  Social History Narrative   Married.   1 child, 1 grandchild.   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   Enjoys going to [employer], singing.    Social Determinants of Health   Financial Resource Strain: Not on file  Food Insecurity: Not on file  Transportation Needs: Not on file  Physical Activity: Not on file  Stress: Not on file  Social Connections: Not on file  Intimate Partner Violence: Not  on file    Past Surgical History:  Procedure Laterality Date   COLONOSCOPY  2010   2020   HYSTEROSCOPY WITH D & C N/A 07/27/2019   Procedure: DILATATION AND CURETTAGE /HYSTEROSCOPY, Polypectomy;  Surgeon: Osborne Oman, MD;  Location: [place];  Service: Gynecology;  Laterality: N/A;   HYSTEROSCOPY WITH D & C N/A 06/26/2021   Procedure: DILATATION AND CURETTAGE /HYSTEROSCOPY;  Surgeon: Osborne Oman, MD;  Location: [place];  Service: Gynecology;  Laterality: N/A;   INTRAUTERINE DEVICE (IUD) INSERTION N/A 06/26/2021   Procedure: INTRAUTERINE DEVICE (IUD) INSERTION;  Surgeon: Osborne Oman, MD;  Location: [place];  Service: Gynecology;   Laterality: N/A;    Family History  Problem Relation Age of Onset   Depression Mother    Stroke Mother    Sudden death Mother    Heart disease Mother    Hypertension Mother    Heart attack Mother 11   Cancer Father    Prostate cancer Father    Heart disease Father 41       CAD, STENT   Coronary artery disease Sister        stent x 2   Coronary artery disease Sister 44       stent placed   Colon cancer Maternal Aunt    Heart disease Brother        Myocardial infarction   Sudden death Other    Depression Other    Colon polyps Neg Hx    Esophageal cancer Neg Hx    Rectal cancer Neg Hx    Stomach cancer Neg Hx    Breast cancer Neg Hx     No Known Allergies  Current Outpatient Medications on File Prior to Visit  Medication Sig Dispense Refill   albuterol (VENTOLIN HFA) 108 (90 Base) MCG/ACT inhaler Inhale 2 puffs into the lungs every 6 (six) hours as needed for wheezing or shortness of breath. 8 g 2   amLODipine (NORVASC) 10 MG tablet Take 1 tablet by mouth once daily for blood pressure 90 tablet 1   Cholecalciferol (VITAMIN D3) 1000 UNITS tablet Take 1,000 Units by mouth daily.     docusate sodium (COLACE) 100 MG capsule Take 1 capsule (100 mg total) by mouth 2 (two) times daily as needed for mild constipation or moderate constipation. 30 capsule 2   fluticasone (FLONASE) 50 MCG/ACT nasal spray Place 1 spray into both nostrils 2 (two) times daily as needed for allergies or rhinitis. 16 g 0   loratadine (CLARITIN) 10 MG tablet Take 10 mg by mouth daily.     losartan-hydrochlorothiazide (HYZAAR) 100-25 MG tablet Take 1 tablet by mouth daily. for blood pressure. Office visit required for further refills. 30 tablet 0   Melatonin 10 MG TABS Take by mouth as needed.     omeprazole (PRILOSEC) 20 MG capsule Take 20 mg by mouth every other day.     Potassium Bicarbonate 99 MG CAPS Take 1 tablet by mouth in the morning and at bedtime.     rosuvastatin (CRESTOR) 20 MG tablet Take 1  tablet (20 mg total) by mouth daily. 90 tablet 1   Semaglutide,0.25 or 0.'5MG'$ /DOS, (OZEMPIC, 0.25 OR 0.5 MG/DOSE,) 2 MG/1.5ML SOPN Inject 0.25 mg into the skin once a week. (Patient not taking: Reported on 10/30/2021) 1.5 mL 0   No current facility-administered medications on file prior to visit.    BP 124/78   Pulse 61   Temp 99  F (37.2 C) (Oral)   Ht 5' 5.75" (1.67 m)   Wt 272 lb (123.4 kg)   SpO2 98%   BMI 44.24 kg/m  Objective:   Physical Exam HENT:     Right Ear: Tympanic membrane and ear canal normal.     Left Ear: Tympanic membrane and ear canal normal.     Nose: Nose normal.  Eyes:     Conjunctiva/sclera: Conjunctivae normal.     Pupils: Pupils are equal, round, and reactive to light.  Neck:     Thyroid: No thyromegaly.  Cardiovascular:     Rate and Rhythm: Normal rate and regular rhythm.     Heart sounds: No murmur heard. Pulmonary:     Effort: Pulmonary effort is normal.     Breath sounds: Normal breath sounds. No rales.  Abdominal:     General: Bowel sounds are normal.     Palpations: Abdomen is soft.     Tenderness: There is no abdominal tenderness.  Musculoskeletal:        General: Normal range of motion.     Cervical back: Neck supple.  Lymphadenopathy:     Cervical: No cervical adenopathy.  Skin:    General: Skin is warm and dry.     Findings: No rash.  Neurological:     Mental Status: She is alert and oriented to person, place, and time.     Cranial Nerves: No cranial nerve deficit.     Deep Tendon Reflexes: Reflexes are normal and symmetric.  Psychiatric:        Mood and Affect: Mood normal.           Assessment & Plan:   Problem List Items Addressed This Visit       Cardiovascular and Mediastinum   Essential hypertension    Controlled.  Continue amlodipine 10 mg daily, losartan-hydrochlorothiazide 100-25 mg daily. CMP pending.      Relevant Orders   Comprehensive metabolic panel   Aortic atherosclerosis (HCC)    Repeat lipid  panel pending. LDL goal is <70. Continue rosuvastatin 20 mg daily.      Relevant Orders   Lipid panel     Respiratory   OSA (obstructive sleep apnea)    Not wearing CPAP machine secondary to insurance issues.  She is working to get her CPAP machine back.  Discussed to contact her pulmonology provider.        Digestive   GERD (gastroesophageal reflux disease)    Controlled.   Continue omeprazole 20 mg every other day.        Other   Preventative health care - Primary    Pneumovax 23 due, provided today, other vaccines UTD. Mammogram UTD. Bone density scan UTD. Colonoscopy UTD due 2027.  Discussed the importance of a healthy diet and regular exercise in order for weight loss, and to reduce the risk of further co-morbidity.  Exam stable. Labs pending.  Follow up in 1 year for repeat physical.       Prediabetes    Commended her on regular exercise.  Repeat A1C pending.       Relevant Orders   Hemoglobin A1c   Hyperlipidemia    LDL goal is <70. Repeat lipids pending.  Continue rosuvastatin 20 mg daily.      Dyspnea    Improved! Completed pulmonary rehab.   Encouraged continued exercise, swimming, walking. Continue to monitor.       Other Visit Diagnoses     Encounter for screening mammogram for malignant  neoplasm of breast       Relevant Orders   MM 3D SCREEN BREAST BILATERAL          Pleas Koch, NP

## 2021-10-30 NOTE — Assessment & Plan Note (Signed)
Controlled.   Continue omeprazole 20 mg every other day.

## 2021-10-30 NOTE — Assessment & Plan Note (Signed)
LDL goal is <70. Repeat lipids pending.  Continue rosuvastatin 20 mg daily.

## 2021-10-31 ENCOUNTER — Other Ambulatory Visit: Payer: Self-pay | Admitting: Primary Care

## 2021-10-31 DIAGNOSIS — Z23 Encounter for immunization: Secondary | ICD-10-CM | POA: Diagnosis not present

## 2021-10-31 DIAGNOSIS — R7303 Prediabetes: Secondary | ICD-10-CM

## 2021-10-31 DIAGNOSIS — I10 Essential (primary) hypertension: Secondary | ICD-10-CM

## 2021-10-31 LAB — COMPREHENSIVE METABOLIC PANEL
ALT: 10 U/L (ref 0–35)
AST: 14 U/L (ref 0–37)
Albumin: 4.1 g/dL (ref 3.5–5.2)
Alkaline Phosphatase: 69 U/L (ref 39–117)
BUN: 18 mg/dL (ref 6–23)
CO2: 31 mEq/L (ref 19–32)
Calcium: 10 mg/dL (ref 8.4–10.5)
Chloride: 104 mEq/L (ref 96–112)
Creatinine, Ser: 1.05 mg/dL (ref 0.40–1.20)
GFR: 55.26 mL/min — ABNORMAL LOW (ref >60.00)
Glucose, Bld: 110 mg/dL — ABNORMAL HIGH (ref 70–99)
Potassium: 3.7 mEq/L (ref 3.5–5.1)
Sodium: 143 mEq/L (ref 135–145)
Total Bilirubin: 0.4 mg/dL (ref 0.2–1.2)
Total Protein: 7.2 g/dL (ref 6.0–8.3)

## 2021-10-31 LAB — LIPID PANEL
Cholesterol: 123 mg/dL (ref 0–200)
HDL: 51.6 mg/dL (ref >39.00)
LDL Cholesterol: 58 mg/dL (ref 0–99)
NonHDL: 71.38
Total CHOL/HDL Ratio: 2
Triglycerides: 68 mg/dL (ref 0.0–149.0)
VLDL: 13.6 mg/dL (ref 0.0–40.0)

## 2021-10-31 LAB — HEMOGLOBIN A1C: Hgb A1c MFr Bld: 6.2 % (ref 4.6–6.5)

## 2021-10-31 NOTE — Addendum Note (Signed)
Addended by: Francella Solian on: 10/31/2021 10:03 AM   Modules accepted: Orders

## 2021-11-01 ENCOUNTER — Telehealth (INDEPENDENT_AMBULATORY_CARE_PROVIDER_SITE_OTHER): Payer: Medicare Other | Admitting: Primary Care

## 2021-11-01 DIAGNOSIS — G4733 Obstructive sleep apnea (adult) (pediatric): Secondary | ICD-10-CM | POA: Diagnosis not present

## 2021-11-01 NOTE — Patient Instructions (Addendum)
No changes Continue to wear CPAP every night FU in 6 months or sooner if needed
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
## 2021-11-01 NOTE — Telephone Encounter (Signed)
[name], please advise. Thanks 
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
## 2021-11-04 ENCOUNTER — Other Ambulatory Visit: Payer: Self-pay | Admitting: Primary Care

## 2021-11-04 DIAGNOSIS — I10 Essential (primary) hypertension: Secondary | ICD-10-CM

## 2021-11-10 NOTE — Progress Notes (Signed)
Reviewed and agree with assessment/plan.   Chesley Mires, MD [facility] Pulmonary/Critical Care 11/10/2021, 7:28 AM Pager:  [phone]

## 2021-11-18 ENCOUNTER — Other Ambulatory Visit: Payer: Self-pay | Admitting: Primary Care

## 2021-11-18 DIAGNOSIS — I10 Essential (primary) hypertension: Secondary | ICD-10-CM

## 2021-12-05 NOTE — Telephone Encounter (Signed)
[name], please advise. Thanks 

## 2021-12-06 NOTE — Telephone Encounter (Signed)
Im guessing she needs an updated sleep study? Or just a referral to dentistry? Not sure. She had sleep study in 2022, that should be recent enough

## 2021-12-16 NOTE — Telephone Encounter (Signed)
Called and spoke to patient. Scheduled 10/03 '@2'$ :15

## 2021-12-17 ENCOUNTER — Ambulatory Visit (INDEPENDENT_AMBULATORY_CARE_PROVIDER_SITE_OTHER): Payer: Medicare Other

## 2021-12-17 DIAGNOSIS — Z23 Encounter for immunization: Secondary | ICD-10-CM | POA: Diagnosis not present

## 2021-12-26 ENCOUNTER — Encounter: Payer: Self-pay | Admitting: Obstetrics & Gynecology

## 2021-12-31 ENCOUNTER — Encounter: Payer: Self-pay | Admitting: Obstetrics & Gynecology

## 2021-12-31 ENCOUNTER — Ambulatory Visit (INDEPENDENT_AMBULATORY_CARE_PROVIDER_SITE_OTHER): Payer: Medicare Other | Admitting: Obstetrics & Gynecology

## 2021-12-31 ENCOUNTER — Ambulatory Visit (INDEPENDENT_AMBULATORY_CARE_PROVIDER_SITE_OTHER): Payer: Medicare Other

## 2021-12-31 VITALS — Ht 65.75 in | Wt 270.0 lb

## 2021-12-31 VITALS — BP 139/79 | HR 74 | Wt 270.0 lb

## 2021-12-31 DIAGNOSIS — Z30431 Encounter for routine checking of intrauterine contraceptive device: Secondary | ICD-10-CM

## 2021-12-31 DIAGNOSIS — Z0000 Encounter for general adult medical examination without abnormal findings: Secondary | ICD-10-CM | POA: Diagnosis not present

## 2021-12-31 DIAGNOSIS — R102 Pelvic and perineal pain: Secondary | ICD-10-CM

## 2021-12-31 DIAGNOSIS — Z5941 Food insecurity: Secondary | ICD-10-CM

## 2021-12-31 NOTE — Patient Instructions (Addendum)
Paige Hester , Thank you for taking time to come for your Medicare Wellness Visit. I appreciate your ongoing commitment to your health goals. Please review the following plan we discussed and let me know if I can assist you in the future.   These are the goals we discussed:  Goals       Patient Stated     Increase physical activity (pt-stated)      Weight loss goal 250lb Increase physical activity with [employer] program         This is a list of the screening recommended for you and due dates:  Health Maintenance  Topic Date Due   Tetanus Vaccine  06/01/2022   Mammogram  02/01/2023   Colon Cancer Screening  09/23/2025   Pneumonia Vaccine  Completed   Flu Shot  Completed   DEXA scan (bone density measurement)  Completed   Hepatitis C Screening: USPSTF Recommendation to screen - Ages 50-79 yo.  Completed   Zoster (Shingles) Vaccine  Completed   HPV Vaccine  Aged Out   COVID-19 Vaccine  Discontinued    Advanced directives: End of life planning; Advanced aging; Advanced directives discussed.  No HCPOA/Living Will.  Additional information declined at this time.  Conditions/risks identified: Meals on Wheels referral placed per patient request.   Next appointment: Follow up in one year for your annual wellness visit    Preventive Care 65 Years and Older, Female Preventive care refers to lifestyle choices and visits with your health care provider that can promote health and wellness. What does preventive care include? A yearly physical exam. This is also called an annual well check. Dental exams once or twice a year. Routine eye exams. Ask your health care provider how often you should have your eyes checked. Personal lifestyle choices, including: Daily care of your teeth and gums. Regular physical activity. Eating a healthy diet. Avoiding tobacco and drug use. Limiting alcohol use. Practicing safe sex. Taking low-dose aspirin every day. Taking vitamin and mineral  supplements as recommended by your health care provider. What happens during an annual well check? The services and screenings done by your health care provider during your annual well check will depend on your age, overall health, lifestyle risk factors, and family history of disease. Counseling  Your health care provider may ask you questions about your: Alcohol use. Tobacco use. Drug use. Emotional well-being. Home and relationship well-being. Sexual activity. Eating habits. History of falls. Memory and ability to understand (cognition). Work and work Statistician. Reproductive health. Screening  You may have the following tests or measurements: Height, weight, and BMI. Blood pressure. Lipid and cholesterol levels. These may be checked every 5 years, or more frequently if you are over 17 years old. Skin check. Lung cancer screening. You may have this screening every year starting at age 67 if you have a 30-pack-year history of smoking and currently smoke or have quit within the past 15 years. Fecal occult blood test (FOBT) of the stool. You may have this test every year starting at age 67. Flexible sigmoidoscopy or colonoscopy. You may have a sigmoidoscopy every 5 years or a colonoscopy every 10 years starting at age 67. Hepatitis C blood test. Hepatitis B blood test. Sexually transmitted disease (STD) testing. Diabetes screening. This is done by checking your blood sugar (glucose) after you have not eaten for a while (fasting). You may have this done every 1-3 years. Bone density scan. This is done to screen for osteoporosis. You may have  this done starting at age 67. Mammogram. This may be done every 1-2 years. Talk to your health care provider about how often you should have regular mammograms. Talk with your health care provider about your test results, treatment options, and if necessary, the need for more tests. Vaccines  Your health care provider may recommend certain  vaccines, such as: Influenza vaccine. This is recommended every year. Tetanus, diphtheria, and acellular pertussis (Tdap, Td) vaccine. You may need a Td booster every 10 years. Zoster vaccine. You may need this after age 67. Pneumococcal 13-valent conjugate (PCV13) vaccine. One dose is recommended after age 67. Pneumococcal polysaccharide (PPSV23) vaccine. One dose is recommended after age 60. Talk to your health care provider about which screenings and vaccines you need and how often you need them. This information is not intended to replace advice given to you by your health care provider. Make sure you discuss any questions you have with your health care provider. Document Released: 03/30/2015 Document Revised: 11/21/2015 Document Reviewed: 01/02/2015 Elsevier Interactive Patient Education  2017 [place] Prevention in the Home Falls can cause injuries. They can happen to people of all ages. There are many things you can do to make your home safe and to help prevent falls. What can I do on the outside of my home? Regularly fix the edges of walkways and driveways and fix any cracks. Remove anything that might make you trip as you walk through a door, such as a raised step or threshold. Trim any bushes or trees on the path to your home. Use bright outdoor lighting. Clear any walking paths of anything that might make someone trip, such as rocks or tools. Regularly check to see if handrails are loose or broken. Make sure that both sides of any steps have handrails. Any raised decks and porches should have guardrails on the edges. Have any leaves, snow, or ice cleared regularly. Use sand or salt on walking paths during winter. Clean up any spills in your garage right away. This includes oil or grease spills. What can I do in the bathroom? Use night lights. Install grab bars by the toilet and in the tub and shower. Do not use towel bars as grab bars. Use non-skid mats or decals in  the tub or shower. If you need to sit down in the shower, use a plastic, non-slip stool. Keep the floor dry. Clean up any water that spills on the floor as soon as it happens. Remove soap buildup in the tub or shower regularly. Attach bath mats securely with double-sided non-slip rug tape. Do not have throw rugs and other things on the floor that can make you trip. What can I do in the bedroom? Use night lights. Make sure that you have a light by your bed that is easy to reach. Do not use any sheets or blankets that are too big for your bed. They should not hang down onto the floor. Have a firm chair that has side arms. You can use this for support while you get dressed. Do not have throw rugs and other things on the floor that can make you trip. What can I do in the kitchen? Clean up any spills right away. Avoid walking on wet floors. Keep items that you use a lot in easy-to-reach places. If you need to reach something above you, use a strong step stool that has a grab bar. Keep electrical cords out of the way. Do not use floor polish or  wax that makes floors slippery. If you must use wax, use non-skid floor wax. Do not have throw rugs and other things on the floor that can make you trip. What can I do with my stairs? Do not leave any items on the stairs. Make sure that there are handrails on both sides of the stairs and use them. Fix handrails that are broken or loose. Make sure that handrails are as long as the stairways. Check any carpeting to make sure that it is firmly attached to the stairs. Fix any carpet that is loose or worn. Avoid having throw rugs at the top or bottom of the stairs. If you do have throw rugs, attach them to the floor with carpet tape. Make sure that you have a light switch at the top of the stairs and the bottom of the stairs. If you do not have them, ask someone to add them for you. What else can I do to help prevent falls? Wear shoes that: Do not have high  heels. Have rubber bottoms. Are comfortable and fit you well. Are closed at the toe. Do not wear sandals. If you use a stepladder: Make sure that it is fully opened. Do not climb a closed stepladder. Make sure that both sides of the stepladder are locked into place. Ask someone to hold it for you, if possible. Clearly mark and make sure that you can see: Any grab bars or handrails. First and last steps. Where the edge of each step is. Use tools that help you move around (mobility aids) if they are needed. These include: Canes. Walkers. Scooters. Crutches. Turn on the lights when you go into a dark area. Replace any light bulbs as soon as they burn out. Set up your furniture so you have a clear path. Avoid moving your furniture around. If any of your floors are uneven, fix them. If there are any pets around you, be aware of where they are. Review your medicines with your doctor. Some medicines can make you feel dizzy. This can increase your chance of falling. Ask your doctor what other things that you can do to help prevent falls. This information is not intended to replace advice given to you by your health care provider. Make sure you discuss any questions you have with your health care provider. Document Released: 12/28/2008 Document Revised: 08/09/2015 Document Reviewed: 04/07/2014 Elsevier Interactive Patient Education  2017 [employer].

## 2021-12-31 NOTE — Progress Notes (Signed)
GYNECOLOGY OFFICE VISIT NOTE  History:   Paige Hester is a 66 y.o. [id number] here today for pelvic cramping over the past two weeks.  Pain is now constant and aggravating, but she has not needed to take medication for it. Pain sometimes radiates to her lower back. She is worried she has not been able to feel IUD strings; she had a Hysteroscopy, Dilation and Curettage, Mirena intrauterine device insertion on 06/26/2021.  She denies any abnormal vaginal discharge, bleeding, urinary or gastrointestinal symptoms or other concerns.    Past Medical History:  Diagnosis Date   Allergy    Bilateral swelling of feet and ankles    Gallbladder polyp 2012   GERD (gastroesophageal reflux disease)    Hyperlipidemia    Hypertension    Iron deficiency anemia    Menopausal symptoms    since 2007   Murmur, cardiac    Other fatigue    Prediabetes    Pulmonary embolism ([place]) 06/2020   Shortness of breath    with exertion; patient going to pulmonary rehabilitation   Shortness of breath on exertion    Sleep apnea    Uses CPAP   Vitamin D deficiency     Past Surgical History:  Procedure Laterality Date   COLONOSCOPY  2010   2020   HYSTEROSCOPY WITH D & C N/A 07/27/2019   Procedure: DILATATION AND CURETTAGE /HYSTEROSCOPY, Polypectomy;  Surgeon: Osborne Oman, MD;  Location: [facility];  Service: Gynecology;  Laterality: N/A;   HYSTEROSCOPY WITH D & C N/A 06/26/2021   Procedure: DILATATION AND CURETTAGE /HYSTEROSCOPY;  Surgeon: Osborne Oman, MD;  Location: [facility];  Service: Gynecology;  Laterality: N/A;   INTRAUTERINE DEVICE (IUD) INSERTION N/A 06/26/2021   Procedure: INTRAUTERINE DEVICE (IUD) INSERTION;  Surgeon: Osborne Oman, MD;  Location: [facility];  Service: Gynecology;  Laterality: N/A;    The following portions of the patient's history were reviewed and updated as appropriate: allergies, current medications, past family  history, past medical history, past social history, past surgical history and problem list.   Health Maintenance:  Normal pap and negative HRHPV on 04/24/2021.  Normal mammogram on 02/19/2021.   Review of Systems:  Pertinent items noted in HPI and remainder of comprehensive ROS otherwise negative.  Physical Exam:  BP 139/79   Pulse 74   Wt 270 lb (122.5 kg)   BMI 43.91 kg/m  CONSTITUTIONAL: Well-developed, well-nourished female in no acute distress.  MUSCULOSKELETAL: Normal range of motion. No edema noted. NEUROLOGIC: Alert and oriented to person, place, and time. Normal muscle tone coordination. No cranial nerve deficit noted. PSYCHIATRIC: Normal mood and affect. Normal behavior. Normal judgment and thought content. CARDIOVASCULAR: Normal heart rate noted RESPIRATORY: Effort and breath sounds normal, no problems with respiration noted ABDOMEN: No masses noted. No other overt distention noted.   PELVIC: Normal appearing external genitalia; normal urethral meatus; normal appearing vaginal mucosa and cervix.  No abnormal discharge noted.  Mirena IUD strings seen, about 3 cm in length outside cervix.  Normal uterine size, no other palpable masses, minimal uterine tenderness on palpation. Performed in the presence of a chaperone     Assessment and Plan:     1. Pelvic pain in female 2. IUD check up Strings visualized, patient reassured. Will check ultrasound to evaluate intrauterine IUD position, also make sure it is not embedded into uterine walls. In the meantime, recommended NSAIDs to help with pain.  Will follow up results  and manage accordingly. - US PELVIC COMPLETE WITH TRANSVAGINAL; Future  Routine preventative health maintenance measures emphasized. Please refer to After Visit Summary for other counseling recommendations.   Return for any gynecologic concerns.    I spent 20 minutes dedicated to the care of this patient including pre-visit review of records, face to face time with  the patient discussing her conditions and treatments and post visit orders.    Verita Schneiders, MD, [place] for [employer], [place]

## 2021-12-31 NOTE — Progress Notes (Signed)
Subjective:   Paige Hester is a 66 y.o. female who presents for an Initial Medicare Annual Wellness Visit.  Review of Systems    No ROS.  Medicare Wellness Virtual Visit.  Visual/audio telehealth visit, UTA vital signs.   See social history for additional risk factors.   Cardiac Risk Factors include: advanced age (>70mn, >>41women);hypertension     Objective:    Today's Vitals   12/31/21 1105  Weight: 270 lb (122.5 kg)  Height: 5' 5.75" (1.67 m)   Body mass index is 43.91 kg/m.     12/31/2021   11:05 AM 06/26/2021   11:42 AM 02/25/2021    3:55 PM 12/13/2020    5:57 PM 07/11/2020   11:00 PM 07/11/2020    5:46 PM 07/27/2019    8:35 AM  Advanced Directives  Does Patient Have a Medical Advance Directive? No No Yes No No No No  Does patient want to make changes to medical advance directive?   No - Patient declined      Would patient like information on creating a medical advance directive? No - Patient declined No - Patient declined  No - Patient declined Yes (Inpatient - patient requests chaplain consult to create a medical advance directive)  Yes (MAU/Ambulatory/Procedural Areas - Information given)    Current Medications (verified) Outpatient Encounter Medications as of 12/31/2021  Medication Sig   albuterol (VENTOLIN HFA) 108 (90 Base) MCG/ACT inhaler Inhale 2 puffs into the lungs every 6 (six) hours as needed for wheezing or shortness of breath.   amLODipine (NORVASC) 10 MG tablet Take 1 tablet by mouth once daily for blood pressure   Cholecalciferol (VITAMIN D3) 1000 UNITS tablet Take 1,000 Units by mouth daily.   docusate sodium (COLACE) 100 MG capsule Take 1 capsule (100 mg total) by mouth 2 (two) times daily as needed for mild constipation or moderate constipation.   fluticasone (FLONASE) 50 MCG/ACT nasal spray Place 1 spray into both nostrils 2 (two) times daily as needed for allergies or rhinitis.   loratadine (CLARITIN) 10 MG tablet Take 10 mg by mouth daily.    losartan-hydrochlorothiazide (HYZAAR) 100-25 MG tablet Take 1 tablet by mouth daily. for blood pressure.   Melatonin 10 MG TABS Take by mouth as needed.   omeprazole (PRILOSEC) 20 MG capsule Take 20 mg by mouth every other day.   Potassium Bicarbonate 99 MG CAPS Take 1 tablet by mouth in the morning and at bedtime.   rosuvastatin (CRESTOR) 20 MG tablet Take 1 tablet (20 mg total) by mouth daily.   No facility-administered encounter medications on file as of 12/31/2021.    Allergies (verified) Patient has no known allergies.   History: Past Medical History:  Diagnosis Date   Allergy    Bilateral swelling of feet and ankles    Gallbladder polyp 2012   GERD (gastroesophageal reflux disease)    Hyperlipidemia    Hypertension    Iron deficiency anemia    Menopausal symptoms    since 2007   Murmur, cardiac    Other fatigue    Prediabetes    Pulmonary embolism [place] 06/2020   Shortness of breath    with exertion; patient going to pulmonary rehabilitation   Shortness of breath on exertion    Sleep apnea    Uses CPAP   Vitamin D deficiency    Past Surgical History:  Procedure Laterality Date   COLONOSCOPY  2010   2020   HYSTEROSCOPY WITH D & C N/A 07/27/2019  Procedure: DILATATION AND CURETTAGE /HYSTEROSCOPY, Polypectomy;  Surgeon: Osborne Oman, MD;  Location: [facility];  Service: Gynecology;  Laterality: N/A;   HYSTEROSCOPY WITH D & C N/A 06/26/2021   Procedure: DILATATION AND CURETTAGE /HYSTEROSCOPY;  Surgeon: Osborne Oman, MD;  Location: [facility];  Service: Gynecology;  Laterality: N/A;   INTRAUTERINE DEVICE (IUD) INSERTION N/A 06/26/2021   Procedure: INTRAUTERINE DEVICE (IUD) INSERTION;  Surgeon: Osborne Oman, MD;  Location: [facility];  Service: Gynecology;  Laterality: N/A;   Family History  Problem Relation Age of Onset   Depression Mother    Stroke Mother    Sudden death Mother    Heart disease Mother     Hypertension Mother    Heart attack Mother 63   Cancer Father    Prostate cancer Father    Heart disease Father 31       CAD, STENT   Coronary artery disease Sister        stent x 2   Coronary artery disease Sister 30       stent placed   Colon cancer Maternal Aunt    Heart disease Brother        Myocardial infarction   Sudden death Other    Depression Other    Colon polyps Neg Hx    Esophageal cancer Neg Hx    Rectal cancer Neg Hx    Stomach cancer Neg Hx    Breast cancer Neg Hx    Social History   Socioeconomic History   Marital status: Legally Separated    Spouse name: Not on file   Number of children: 1   Years of education: Not on file   Highest education level: Not on file  Occupational History   Occupation: Retired  Tobacco Use   Smoking status: Former    Packs/day: 1.00    Years: 27.00    Total pack years: 27.00    Types: Cigarettes    Quit date: 1999    Years since quitting: 24.8   Smokeless tobacco: Never  Vaping Use   Vaping Use: Never used  Substance and Sexual Activity   Alcohol use: Yes    Alcohol/week: 7.0 standard drinks of alcohol    Types: 7 Glasses of wine per week    Comment: 1-2 glasses of wine per night   Drug use: No   Sexual activity: Yes  Other Topics Concern   Not on file  Social History Narrative   Married.   1 child, 1 grandchild.   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   Enjoys going to [employer], singing.    Social Determinants of Health   Financial Resource Strain: Medium Risk (12/31/2021)   Overall Financial Resource Strain (CARDIA)    Difficulty of Paying Living Expenses: Somewhat hard  Food Insecurity: Food Insecurity Present (12/27/2021)   Hunger Vital Sign    Worried About Running Out of Food in the Last Year: Often true    Ran Out of Food in the Last Year: Sometimes true  Transportation Needs: No Transportation Needs (12/31/2021)   PRAPARE - Radiographer, therapeutic (Medical): No    Lack of Transportation (Non-Medical): No  Physical Activity: Sufficiently Active (12/31/2021)   Exercise Vital Sign    Days of Exercise per Week: 3 days    Minutes of Exercise per Session: 50 min  Stress: No Stress Concern Present (12/31/2021)   [place]  [employer]stitute of Occupational Health - Occupational Stress Questionnaire    Feeling of Stress : Only a little  Social Connections: Unknown (12/31/2021)   Social Connection and Isolation Panel [NHANES]    Frequency of Communication with Friends and Family: More than three times a week    Frequency of Social Gatherings with Friends and Family: Once a week    Attends Religious Services: Not on Advertising copywriter or Organizations: Yes    Attends Music therapist: More than 4 times per year    Marital Status: Separated    Tobacco Counseling Counseling given: Not Answered   Clinical Intake:  Pre-visit preparation completed: Yes        Diabetes: No  How often do you need to have someone help you when you read instructions, pamphlets, or other written materials from your doctor or pharmacy?: 1 - Never  Interpreter Needed?: No      Activities of Daily Living    12/31/2021   11:08 AM 12/27/2021    1:16 PM  In your present state of health, do you have any difficulty performing the following activities:  Hearing? 0 0  Vision? 0 0  Difficulty concentrating or making decisions? 0 0  Walking or climbing stairs? 0 0  Dressing or bathing? 0 0  Doing errands, shopping? 0 0  Preparing Food and eating ? N N  Using the Toilet? N N  In the past six months, have you accidently leaked urine? N N  Do you have problems with loss of bowel control? N N  Managing your Medications? N N  Managing your Finances? N N  Housekeeping or managing your Housekeeping? N N    Patient Care Team: Pleas Koch, NP as PCP - General (Nurse Practitioner)  Indicate any recent Medical Services  you may have received from other than [facility] providers in the past year (date may be approximate).     Assessment:   This is a routine wellness examination for Paige Hester.  I connected with  Paige Hester on 12/31/21 by a audio enabled telemedicine application and verified that I am speaking with the correct person using two identifiers.  Patient Location: Home  Provider Location: Office/Clinic  I discussed the limitations of evaluation and management by telemedicine. The patient expressed understanding and agreed to proceed.   Hearing/Vision screen Hearing Screening - Comments:: Patient is able to hear conversational tones without difficulty.  No issues reported.   Vision Screening - Comments:: Followed by Suzie Portela Wears corrective lenses They have seen their ophthalmologist in the last 12 months.    Dietary issues and exercise activities discussed: Current Exercise Habits: Structured exercise class, Time (Minutes): 60, Frequency (Times/Week): 3, Weekly Exercise (Minutes/Week): 180, Intensity: Moderate   Goals Addressed               This Visit's Progress     Patient Stated     Increase physical activity (pt-stated)        Weight loss goal 250lb Increase physical activity with Senior Sneaker program        Depression Screen    12/31/2021   11:08 AM 10/30/2021    2:16 PM 09/02/2021    3:40 PM 08/08/2021    4:00 PM 07/24/2021    4:04 PM 06/19/2021    1:57 PM 05/29/2021    1:46 PM  PHQ 2/9 Scores  PHQ - 2 Score 0 0 0 0 0 2 3  PHQ- 9 Score  0 '5 6 6 7 6    '$ Fall Risk    12/31/2021   11:10 AM 12/27/2021    1:16 PM 02/25/2021    3:36 PM 12/27/2020    2:43 PM 12/27/2020    2:04 PM  Fall Risk   Falls in the past year? 1 1 0 0 0  Number falls in past yr: 0 0 0 0 0  Injury with Fall? 0 1  0 0  Comment Tripped going up the steps      Risk for fall due to : No Fall Risks      Follow up Falls evaluation completed        [place]: Home free of loose throw rugs in walkways, pet beds, electrical cords, etc? Yes  Adequate lighting in your home to reduce risk of falls? Yes   ASSISTIVE DEVICES UTILIZED TO PREVENT FALLS: Life alert? No  Use of a cane, walker or w/c? No  Grab bars in the bathroom? Yes  Shower chair or bench in shower? Yes  Elevated toilet seat or a handicapped toilet? No   TIMED UP AND GO: Was the test performed? No .    Cognitive Function:        12/31/2021   11:18 AM  6CIT Screen  What Year? 0 points  What month? 0 points  What time? 0 points  Count back from 20 0 points  Months in reverse 0 points  Repeat phrase 0 points  Total Score 0 points    Immunizations Immunization History  Administered Date(s) Administered   Fluad Quad(high Dose 65+) 12/27/2020, 12/17/2021   Influenza Whole 04/01/2011   Influenza,inj,Quad PF,6+ Mos 11/30/2012, 12/22/2018   Moderna Sars-Covid-2 Vaccination 11/17/2019, 12/15/2019, 06/06/2020   PNEUMOCOCCAL CONJUGATE-20 10/18/2020   Pneumococcal Polysaccharide-23 10/31/2021   Tdap 05/31/2012   Zoster Recombinat (Shingrix) 10/12/2018, 12/22/2018   Screening Tests Health Maintenance  Topic Date Due   TETANUS/TDAP  06/01/2022   MAMMOGRAM  02/01/2023   COLONOSCOPY (Pts 45-34yr Insurance coverage will need to be confirmed)  09/23/2025   Pneumonia Vaccine 66 Years old  Completed   INFLUENZA VACCINE  Completed   DEXA SCAN  Completed   Hepatitis C Screening  Completed   Zoster Vaccines- Shingrix  Completed   HPV VACCINES  Aged Out   COVID-19 Vaccine  Discontinued   Health Maintenance There are no preventive care reminders to display for this patient.  Mammogram- scheduled 02/20/22.   Lung Cancer Screening: (Low Dose CT Chest recommended if Age 67-80years, 30 pack-year currently smoking OR have quit w/in 15years.) does not qualify.   Hepatitis C Screening: Completed 2020.  Vision Screening: Recommended annual ophthalmology exams for early  detection of glaucoma and other disorders of the eye.  Dental Screening: Recommended annual dental exams for proper oral hygiene  Community Resource Referral / Chronic Care Management: CRR required this visit?  No   CCM required this visit?  Yes, meals on wheels follow up request. Food insecurity.     Plan:     I have personally reviewed and noted the following in the patient's chart:   Medical and social history Use of alcohol, tobacco or illicit drugs  Current medications and supplements including opioid prescriptions. Patient is not currently taking opioid prescriptions. Functional ability and status Nutritional status Physical activity Advanced directives List of other physicians Hospitalizations, surgeries, and ER visits in previous 12 months Vitals Screenings to include cognitive, depression, and falls Referrals and appointments  In addition, I have reviewed and discussed with patient certain preventive protocols, quality metrics, and best practice recommendations. A written personalized care plan for preventive services as well as general preventive health recommendations were provided to patient.     Varney Biles, LPN   [id number]

## 2022-01-01 ENCOUNTER — Telehealth: Payer: Self-pay

## 2022-01-01 NOTE — Telephone Encounter (Signed)
   Telephone encounter was:  Unsuccessful.  01/01/2022 Name: Paige Hester MRN: [id number] DOB: 05-Aug-1955  Unsuccessful outbound call made today to assist with:  Food Insecurity  Outreach Attempt:  1st Attempt  A HIPAA compliant voice message was left requesting a return call.  Instructed patient to call back    [facility]  [phone] [address], [place], [place] [address] Phone: [phone] Email: [email or web address]

## 2022-01-03 ENCOUNTER — Telehealth: Payer: Self-pay

## 2022-01-03 ENCOUNTER — Ambulatory Visit
Admission: RE | Admit: 2022-01-03 | Discharge: 2022-01-03 | Disposition: A | Discharge status: Home / Self Care | Payer: Medicare Other | Source: Ambulatory Visit | Attending: Obstetrics & Gynecology | Admitting: Obstetrics & Gynecology

## 2022-01-03 DIAGNOSIS — Z30431 Encounter for routine checking of intrauterine contraceptive device: Secondary | ICD-10-CM | POA: Diagnosis present

## 2022-01-03 DIAGNOSIS — R102 Pelvic and perineal pain: Secondary | ICD-10-CM | POA: Diagnosis present

## 2022-01-03 NOTE — Telephone Encounter (Signed)
   Telephone encounter was:  Unsuccessful.  01/03/2022 Name: Paige Hester MRN: [id number] DOB: [birth year]  Unsuccessful outbound call made today to assist with:  Food Insecurity  Outreach Attempt:  2nd Attempt  A HIPAA compliant voice message was left requesting a return call.  Instructed patient to call back    [facility]  [phone] [address], [place], [place] [address] Phone: [phone] Email: [email or web address]

## 2022-01-06 ENCOUNTER — Telehealth: Payer: Self-pay

## 2022-01-06 NOTE — Telephone Encounter (Signed)
   Telephone encounter was:  Unsuccessful.  01/06/2022 Name: Paige Hester MRN: [id number] DOB: 08/24/1955  Unsuccessful outbound call made today to assist with:  Food Insecurity  Outreach Attempt:  3rd Attempt.  Referral closed unable to contact patient.  A HIPAA compliant voice message was left requesting a return call.  Instructed patient to call back    [place]  [phone] [address], [place], [place] [address] Phone: [phone] Email: [email or web address]

## 2022-01-17 ENCOUNTER — Other Ambulatory Visit (INDEPENDENT_AMBULATORY_CARE_PROVIDER_SITE_OTHER): Payer: Medicare Other

## 2022-01-17 DIAGNOSIS — I10 Essential (primary) hypertension: Secondary | ICD-10-CM

## 2022-01-17 DIAGNOSIS — R7303 Prediabetes: Secondary | ICD-10-CM

## 2022-01-17 LAB — BASIC METABOLIC PANEL
BUN: 13 mg/dL (ref 6–23)
CO2: 31 mEq/L (ref 19–32)
Calcium: 10.1 mg/dL (ref 8.4–10.5)
Chloride: 102 mEq/L (ref 96–112)
Creatinine, Ser: 0.84 mg/dL (ref 0.40–1.20)
GFR: 72.12 mL/min (ref >60.00)
Glucose, Bld: 98 mg/dL (ref 70–99)
Potassium: 3.7 mEq/L (ref 3.5–5.1)
Sodium: 140 mEq/L (ref 135–145)

## 2022-01-17 LAB — HEMOGLOBIN A1C: Hgb A1c MFr Bld: 6.2 % (ref 4.6–6.5)

## 2022-01-21 ENCOUNTER — Ambulatory Visit (INDEPENDENT_AMBULATORY_CARE_PROVIDER_SITE_OTHER): Payer: Medicare Other | Admitting: Primary Care

## 2022-01-21 ENCOUNTER — Encounter: Payer: Self-pay | Admitting: Primary Care

## 2022-01-21 VITALS — BP 156/84 | HR 70 | Temp 98.1°F | Ht 65.75 in | Wt 274.0 lb

## 2022-01-21 DIAGNOSIS — R8290 Unspecified abnormal findings in urine: Secondary | ICD-10-CM | POA: Insufficient documentation

## 2022-01-21 LAB — POC URINALSYSI DIPSTICK (AUTOMATED)
Bilirubin, UA: NEGATIVE
Blood, UA: NEGATIVE
Glucose, UA: NEGATIVE
Ketones, UA: NEGATIVE
Leukocytes, UA: NEGATIVE
Nitrite, UA: NEGATIVE
Protein, UA: POSITIVE — AB
Spec Grav, UA: 1.01 (ref 1.010–1.025)
Urobilinogen, UA: 0.2 E.U./dL
pH, UA: 7 (ref 5.0–8.0)

## 2022-01-21 NOTE — Progress Notes (Signed)
Subjective:    Patient ID: Paige Hester, female    DOB: May 05, 1955, 66 y.o.   MRN: [id number]  HPI  Paige Hester is a very pleasant 66 y.o. female with a history of hypertension, OSA, morbid obesity, prediabetes, hyperlipidemia who presents today to discuss foul-smelling urine.  Symptom onset 8 days ago with foul-smelling urine.  She is also noticed cramping to the left groin and left lower back which began about 2.5 weeks ago.   She saw her OB/GYN a few weeks ago after symptom onset for evaluation of her IUD and her IUD was in place. She completed transvaginal US.   She denies hematuria, dysuria, urinary frequency, vaginal itching, vaginal discharge. Her foul urine smell has diminished some. She's been taking Tylenol sporadically. Her lower back pain cannot be provoked with movement. She has been more sedentary since the colder weather.    Review of Systems  Gastrointestinal:  Negative for abdominal pain.  Genitourinary:  Negative for dysuria, frequency, hematuria, urgency and vaginal discharge.  Musculoskeletal:  Positive for back pain.         Past Medical History:  Diagnosis Date   Allergy    Bilateral swelling of feet and ankles    Gallbladder polyp 2012   GERD (gastroesophageal reflux disease)    Hyperlipidemia    Hypertension    Iron deficiency anemia    Menopausal symptoms    since 2007   Murmur, cardiac    Other fatigue    Prediabetes    Pulmonary embolism (HCC) 06/2020   Shortness of breath    with exertion; patient going to pulmonary rehabilitation   Shortness of breath on exertion    Sleep apnea    Uses CPAP   Vitamin D deficiency     Social History   Socioeconomic History   Marital status: Legally Separated    Spouse name: Not on file   Number of children: 1   Years of education: Not on file   Highest education level: Not on file  Occupational History   Occupation: Retired  Tobacco Use   Smoking status: Former    Packs/day: 1.00     Years: 27.00    Total pack years: 27.00    Types: Cigarettes    Quit date: 1999    Years since quitting: 24.8   Smokeless tobacco: Never  Vaping Use   Vaping Use: Never used  Substance and Sexual Activity   Alcohol use: Yes    Alcohol/week: 7.0 standard drinks of alcohol    Types: 7 Glasses of wine per week    Comment: 1-2 glasses of wine per night   Drug use: No   Sexual activity: Yes  Other Topics Concern   Not on file  Social History Narrative   Married.   1 child, 1 grandchild.   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   Enjoys going to [employer], singing.    Social Determinants of Health   Financial Resource Strain: Medium Risk (12/31/2021)   Overall Financial Resource Strain (CARDIA)    Difficulty of Paying Living Expenses: Somewhat hard  Food Insecurity: Food Insecurity Present (12/27/2021)   Hunger Vital Sign    Worried About Running Out of Food in the Last Year: Often true    Ran Out of Food in the Last Year: Sometimes true  Transportation Needs: No Transportation Needs (12/31/2021)   PRAPARE - Hydrologist (Medical): No  Lack of Transportation (Non-Medical): No  Physical Activity: Sufficiently Active (12/31/2021)   Exercise Vital Sign    Days of Exercise per Week: 3 days    Minutes of Exercise per Session: 50 min  Stress: No Stress Concern Present (12/31/2021)   [place]    Feeling of Stress : Only a little  Social Connections: Unknown (12/31/2021)   Social Connection and Isolation Panel [NHANES]    Frequency of Communication with Friends and Family: More than three times a week    Frequency of Social Gatherings with Friends and Family: Once a week    Attends Religious Services: Not on file    Active Member of Clubs or Organizations: Yes    Attends Archivist Meetings: More than 4 times per year    Marital  Status: Separated  Intimate Partner Violence: Not At Risk (12/31/2021)   Humiliation, Afraid, Rape, and Kick questionnaire    Fear of Current or Ex-Partner: No    Emotionally Abused: No    Physically Abused: No    Sexually Abused: No    Past Surgical History:  Procedure Laterality Date   COLONOSCOPY  2010   2020   HYSTEROSCOPY WITH D & C N/A 07/27/2019   Procedure: DILATATION AND CURETTAGE /HYSTEROSCOPY, Polypectomy;  Surgeon: Osborne Oman, MD;  Location: [facility];  Service: Gynecology;  Laterality: N/A;   HYSTEROSCOPY WITH D & C N/A 06/26/2021   Procedure: DILATATION AND CURETTAGE /HYSTEROSCOPY;  Surgeon: Osborne Oman, MD;  Location: [facility];  Service: Gynecology;  Laterality: N/A;   INTRAUTERINE DEVICE (IUD) INSERTION N/A 06/26/2021   Procedure: INTRAUTERINE DEVICE (IUD) INSERTION;  Surgeon: Osborne Oman, MD;  Location: [facility];  Service: Gynecology;  Laterality: N/A;    Family History  Problem Relation Age of Onset   Depression Mother    Stroke Mother    Sudden death Mother    Heart disease Mother    Hypertension Mother    Heart attack Mother 85   Cancer Father    Prostate cancer Father    Heart disease Father 25       CAD, STENT   Coronary artery disease Sister        stent x 2   Coronary artery disease Sister 28       stent placed   Colon cancer Maternal Aunt    Heart disease Brother        Myocardial infarction   Sudden death Other    Depression Other    Colon polyps Neg Hx    Esophageal cancer Neg Hx    Rectal cancer Neg Hx    Stomach cancer Neg Hx    Breast cancer Neg Hx     No Known Allergies  Current Outpatient Medications on File Prior to Visit  Medication Sig Dispense Refill   albuterol (VENTOLIN HFA) 108 (90 Base) MCG/ACT inhaler Inhale 2 puffs into the lungs every 6 (six) hours as needed for wheezing or shortness of breath. 8 g 2   amLODipine (NORVASC) 10 MG tablet Take 1 tablet by  mouth once daily for blood pressure 90 tablet 2   Cholecalciferol (VITAMIN D3) 1000 UNITS tablet Take 1,000 Units by mouth daily.     docusate sodium (COLACE) 100 MG capsule Take 1 capsule (100 mg total) by mouth 2 (two) times daily as needed for mild constipation or moderate constipation. 30 capsule 2   fluticasone (FLONASE)  50 MCG/ACT nasal spray Place 1 spray into both nostrils 2 (two) times daily as needed for allergies or rhinitis. 16 g 0   loratadine (CLARITIN) 10 MG tablet Take 10 mg by mouth daily.     losartan-hydrochlorothiazide (HYZAAR) 100-25 MG tablet Take 1 tablet by mouth daily. for blood pressure. 90 tablet 3   Melatonin 10 MG TABS Take by mouth as needed.     omeprazole (PRILOSEC) 20 MG capsule Take 20 mg by mouth every other day.     Potassium Bicarbonate 99 MG CAPS Take 1 tablet by mouth in the morning and at bedtime.     rosuvastatin (CRESTOR) 20 MG tablet Take 1 tablet (20 mg total) by mouth daily. 90 tablet 1   No current facility-administered medications on file prior to visit.    BP (!) 156/84   Pulse 70   Temp 98.1 F (36.7 C) (Temporal)   Ht 5' 5.75" (1.67 m)   Wt 274 lb (124.3 kg)   SpO2 98%   BMI 44.56 kg/m  Objective:   Physical Exam Pulmonary:     Effort: Pulmonary effort is normal.  Musculoskeletal:     Lumbar back: No tenderness. Normal range of motion.     Comments: No pain with lumbar extension, flexion, or twisting.   Skin:    General: Skin is warm and dry.  Neurological:     Mental Status: She is alert.           Assessment & Plan:   Problem List Items Addressed This Visit       Other   Foul smelling urine - Primary    UA today grossly negative. Culture sent.  Continue to increase water intake.  Recommended routine schedule of Tylenol for right lower back cramping. If no improvement then recommend vaginal exam/evaluation.        Relevant Orders   POCT Urinalysis Dipstick (Automated) (Completed)   Urine Culture        Pleas Koch, NP

## 2022-01-21 NOTE — Assessment & Plan Note (Signed)
UA today grossly negative. Culture sent.  Continue to increase water intake.  Recommended routine schedule of Tylenol for right lower back cramping. If no improvement then recommend vaginal exam/evaluation.

## 2022-01-22 LAB — URINE CULTURE
MICRO NUMBER:: 14155046
Result:: NO GROWTH
SPECIMEN QUALITY:: ADEQUATE

## 2022-01-31 ENCOUNTER — Ambulatory Visit (INDEPENDENT_AMBULATORY_CARE_PROVIDER_SITE_OTHER): Payer: Medicare Other | Admitting: Primary Care

## 2022-01-31 ENCOUNTER — Encounter: Payer: Self-pay | Admitting: Primary Care

## 2022-01-31 VITALS — BP 134/78 | HR 72 | Temp 98.2°F | Ht 65.75 in | Wt 268.0 lb

## 2022-01-31 DIAGNOSIS — M79642 Pain in left hand: Secondary | ICD-10-CM | POA: Diagnosis not present

## 2022-01-31 DIAGNOSIS — M79641 Pain in right hand: Secondary | ICD-10-CM | POA: Diagnosis not present

## 2022-01-31 DIAGNOSIS — G5601 Carpal tunnel syndrome, right upper limb: Secondary | ICD-10-CM | POA: Diagnosis not present

## 2022-01-31 MED ORDER — PREDNISONE 20 MG PO TABS: ORAL_TABLET | ORAL | 0 refills | Status: DC

## 2022-01-31 NOTE — Progress Notes (Signed)
Subjective:    Patient ID: Paige Hester, female    DOB: [birth year], 66 y.o.   MRN: [id number]  Hand Pain  Associated symptoms include numbness.    Paige Hester is a very pleasant 66 y.o. female with a history of hypertension, OSA, trigger finger, prediabetes, hypokalemia who presents today to discuss hand pain.  Her pain is located to the right palmer hand at the 3rd and 4th metacarpal joint. She's noticed a knot to the 3rd metacarpal joint on the right palmer hand. Symptoms began about 1 month ago. She does notice hand stiffness and trouble opening jars, this is a chronic issue.  She's also noticed locking of her left 3rd, 4th, and 5th digits. Also with a dull, pain to the right wrist with radiation up to her shoulder. History of carpal tunnel syndrome, diagnosed years ago. She worked typing on a laptop for about 30 years. She has reduced her typing in general since she retired.   Her carpal tunnel symptoms have increased over the years. She will notice her symptoms with activity and rest.   She's taken Tylenol on occasion with improvement.    Review of Systems  Musculoskeletal:  Positive for arthralgias.  Skin:  Negative for color change.  Neurological:  Positive for numbness.         Past Medical History:  Diagnosis Date   Allergy    Bilateral swelling of feet and ankles    Gallbladder polyp 2012   GERD (gastroesophageal reflux disease)    Hyperlipidemia    Hypertension    Iron deficiency anemia    Menopausal symptoms    since 2007   Murmur, cardiac    Other fatigue    Prediabetes    Pulmonary embolism (HCC) 06/2020   Shortness of breath    with exertion; patient going to pulmonary rehabilitation   Shortness of breath on exertion    Sleep apnea    Uses CPAP   Vitamin D deficiency     Social History   Socioeconomic History   Marital status: Legally Separated    Spouse name: Not on file   Number of children: 1   Years of education: Not on file    Highest education level: Not on file  Occupational History   Occupation: Retired  Tobacco Use   Smoking status: Former    Packs/day: 1.00    Years: 27.00    Total pack years: 27.00    Types: Cigarettes    Quit date: 1999    Years since quitting: 24.8   Smokeless tobacco: Never  Vaping Use   Vaping Use: Never used  Substance and Sexual Activity   Alcohol use: Yes    Alcohol/week: 7.0 standard drinks of alcohol    Types: 7 Glasses of wine per week    Comment: 1-2 glasses of wine per night   Drug use: No   Sexual activity: Yes  Other Topics Concern   Not on file  Social History Narrative   Married.   1 child, 1 grandchild.   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   Enjoys going to [employer], singing.    Social Determinants of Health   Financial Resource Strain: Medium Risk (12/31/2021)   Overall Financial Resource Strain (CARDIA)    Difficulty of Paying Living Expenses: Somewhat hard  Food Insecurity: Food Insecurity Present (12/27/2021)   Hunger Vital Sign    Worried About Running Out of Food in the Last Year:  Often true    Ran Out of Food in the Last Year: Sometimes true  Transportation Needs: No Transportation Needs (12/31/2021)   PRAPARE - Hydrologist (Medical): No    Lack of Transportation (Non-Medical): No  Physical Activity: Sufficiently Active (12/31/2021)   Exercise Vital Sign    Days of Exercise per Week: 3 days    Minutes of Exercise per Session: 50 min  Stress: No Stress Concern Present (12/31/2021)   [place]    Feeling of Stress : Only a little  Social Connections: Unknown (12/31/2021)   Social Connection and Isolation Panel [NHANES]    Frequency of Communication with Friends and Family: More than three times a week    Frequency of Social Gatherings with Friends and Family: Once a week    Attends Religious Services:  Not on file    Active Member of Clubs or Organizations: Yes    Attends Archivist Meetings: More than 4 times per year    Marital Status: Separated  Intimate Partner Violence: Not At Risk (12/31/2021)   Humiliation, Afraid, Rape, and Kick questionnaire    Fear of Current or Ex-Partner: No    Emotionally Abused: No    Physically Abused: No    Sexually Abused: No    Past Surgical History:  Procedure Laterality Date   COLONOSCOPY  2010   2020   HYSTEROSCOPY WITH D & C N/A 07/27/2019   Procedure: DILATATION AND CURETTAGE /HYSTEROSCOPY, Polypectomy;  Surgeon: Osborne Oman, MD;  Location: [facility];  Service: Gynecology;  Laterality: N/A;   HYSTEROSCOPY WITH D & C N/A 06/26/2021   Procedure: DILATATION AND CURETTAGE /HYSTEROSCOPY;  Surgeon: Osborne Oman, MD;  Location: [facility];  Service: Gynecology;  Laterality: N/A;   INTRAUTERINE DEVICE (IUD) INSERTION N/A 06/26/2021   Procedure: INTRAUTERINE DEVICE (IUD) INSERTION;  Surgeon: Osborne Oman, MD;  Location: [facility];  Service: Gynecology;  Laterality: N/A;    Family History  Problem Relation Age of Onset   Depression Mother    Stroke Mother    Sudden death Mother    Heart disease Mother    Hypertension Mother    Heart attack Mother 44   Cancer Father    Prostate cancer Father    Heart disease Father 57       CAD, STENT   Coronary artery disease Sister        stent x 2   Coronary artery disease Sister 32       stent placed   Colon cancer Maternal Aunt    Heart disease Brother        Myocardial infarction   Sudden death Other    Depression Other    Colon polyps Neg Hx    Esophageal cancer Neg Hx    Rectal cancer Neg Hx    Stomach cancer Neg Hx    Breast cancer Neg Hx     No Known Allergies  Current Outpatient Medications on File Prior to Visit  Medication Sig Dispense Refill   albuterol (VENTOLIN HFA) 108 (90 Base) MCG/ACT inhaler Inhale 2 puffs  into the lungs every 6 (six) hours as needed for wheezing or shortness of breath. 8 g 2   amLODipine (NORVASC) 10 MG tablet Take 1 tablet by mouth once daily for blood pressure 90 tablet 2   Cholecalciferol (VITAMIN D3) 1000 UNITS tablet Take 1,000 Units by mouth  daily.     docusate sodium (COLACE) 100 MG capsule Take 1 capsule (100 mg total) by mouth 2 (two) times daily as needed for mild constipation or moderate constipation. 30 capsule 2   fluticasone (FLONASE) 50 MCG/ACT nasal spray Place 1 spray into both nostrils 2 (two) times daily as needed for allergies or rhinitis. 16 g 0   loratadine (CLARITIN) 10 MG tablet Take 10 mg by mouth daily.     losartan-hydrochlorothiazide (HYZAAR) 100-25 MG tablet Take 1 tablet by mouth daily. for blood pressure. 90 tablet 3   Melatonin 10 MG TABS Take by mouth as needed.     omeprazole (PRILOSEC) 20 MG capsule Take 20 mg by mouth every other day.     Potassium Bicarbonate 99 MG CAPS Take 1 tablet by mouth in the morning and at bedtime.     rosuvastatin (CRESTOR) 20 MG tablet Take 1 tablet (20 mg total) by mouth daily. 90 tablet 1   No current facility-administered medications on file prior to visit.    BP 134/78   Pulse 72   Temp 98.2 F (36.8 C) (Temporal)   Ht 5' 5.75" (1.67 m)   Wt 268 lb (121.6 kg)   SpO2 98%   BMI 43.59 kg/m  Objective:   Physical Exam Constitutional:      General: She is not in acute distress. Pulmonary:     Effort: Pulmonary effort is normal.  Musculoskeletal:       Hands:     Comments: Firm, bony prominence like mass to right palmer hand at 3 metacarpal joint.   Decreased ROM to bilateral hands in general. Trigger finger noted to left 3-5th digits.            Assessment & Plan:   Problem List Items Addressed This Visit       Nervous and Auditory   Carpal tunnel syndrome of right wrist - Primary    Treat current flare with oral steroids. Start prednisone tablets. Take two tablets my mouth once daily in  the morning for four days, then one tablet once daily in the morning for four days.   Referral placed to orthopedics for ongoing management.      Relevant Medications   predniSONE (DELTASONE) 20 MG tablet   Other Relevant Orders   Ambulatory referral to Orthopedic Surgery     Other   Bilateral hand pain    Suspect osteoarthritis, but will rule out RA given her exam today.  Labs pending for CCP, sed rate, CRP, RF.        Relevant Medications   predniSONE (DELTASONE) 20 MG tablet   Other Relevant Orders   Ambulatory referral to Orthopedic Surgery   C-reactive protein   Sedimentation rate   Cyclic citrul peptide antibody, IgG   Rheumatoid factor       Pleas Koch, NP

## 2022-01-31 NOTE — Patient Instructions (Signed)
Start prednisone tablets. Take two tablets my mouth once daily in the morning for four days, then one tablet once daily in the morning for four days.   You will be contacted regarding your referral to orthopedics.  Please let us know if you have not been contacted within two weeks.   Stop by the lab prior to leaving today. I will notify you of your results once received.   It was a pleasure to see you today!

## 2022-01-31 NOTE — Assessment & Plan Note (Signed)
Treat current flare with oral steroids. Start prednisone tablets. Take two tablets my mouth once daily in the morning for four days, then one tablet once daily in the morning for four days.   Referral placed to orthopedics for ongoing management.

## 2022-01-31 NOTE — Telephone Encounter (Signed)
Patient called in and wanted to follow up on this request. Thank you!

## 2022-01-31 NOTE — Assessment & Plan Note (Signed)
Suspect osteoarthritis, but will rule out RA given her exam today.  Labs pending for CCP, sed rate, CRP, RF.

## 2022-01-31 NOTE — Telephone Encounter (Signed)
Called and scheduled patient for 01/31/22 @ 2:40.

## 2022-02-02 DIAGNOSIS — M79641 Pain in right hand: Secondary | ICD-10-CM

## 2022-02-02 DIAGNOSIS — R768 Other specified abnormal immunological findings in serum: Secondary | ICD-10-CM

## 2022-02-02 LAB — CYCLIC CITRUL PEPTIDE ANTIBODY, IGG: Cyclic Citrullin Peptide Ab: <16 UNITS

## 2022-02-02 LAB — SEDIMENTATION RATE: Sed Rate: 46 mm/h — ABNORMAL HIGH (ref 0–30)

## 2022-02-02 LAB — C-REACTIVE PROTEIN: CRP: 37.7 mg/L — ABNORMAL HIGH (ref <8.0)

## 2022-02-02 LAB — RHEUMATOID FACTOR: Rheumatoid fact SerPl-aCnc: 21 IU/mL — ABNORMAL HIGH (ref <14)

## 2022-02-04 ENCOUNTER — Other Ambulatory Visit: Payer: Medicare Other

## 2022-02-21 ENCOUNTER — Ambulatory Visit
Admission: RE | Admit: 2022-02-21 | Discharge: 2022-02-21 | Disposition: A | Discharge status: Home / Self Care | Payer: Medicare Other | Source: Ambulatory Visit | Attending: Primary Care | Admitting: Primary Care

## 2022-02-21 DIAGNOSIS — Z1231 Encounter for screening mammogram for malignant neoplasm of breast: Secondary | ICD-10-CM | POA: Diagnosis present

## 2022-02-28 ENCOUNTER — Telehealth: Payer: Self-pay

## 2022-02-28 DIAGNOSIS — Z139 Encounter for screening, unspecified: Secondary | ICD-10-CM

## 2022-02-28 NOTE — Addendum Note (Signed)
Addended by: Quinn Plowman E on: 02/28/2022 01:22 PM   Modules accepted: Orders

## 2022-02-28 NOTE — Patient Instructions (Signed)
Visit Information  Thank you for taking time to visit with me today. Please don't hesitate to contact me if I can be of assistance to you.   Following are the goals we discussed today:   Goals Addressed             This Visit's Progress    Patient stated:  Management of carpal tunnel       Care Coordination Interventions: Evaluation of current treatment plan related to carpal tunnel syndrome and patient's adherence to plan as established by provider.  Patient states she is currently under treatment for carpal tunnel and further evaluation for possible rheumatoid arthritis. She states she currently wears a brace to her right wrist during the day and her left wrist at hs.  Patient reports pain level most days is wrists is a 10.  Reports only taking over the counter tylenol for pain management.  Reviewed medications with patient and discussed importance of compliance Reviewed scheduled/upcoming provider appointments:  Patient states she has a follow up visit with the orthopedic provider on 03/05/22 and new patient visit with rheumatologist in June 2024.  Discussed plans with patient for ongoing care management follow up and provided patient with direct contact information for care management team:  Next follow up visit with RNCM is scheduled for 03/25/22.  Discussed and offered care coordination services. Patient verbally agreed to care coordination services and follow up from [facility].  Referral to [employer] guide for food security.  Patient states she is interested in meals on wheels.           Our next appointment is by telephone on 03/25/22 at 2 pm  Please call the care guide team at [phone] if you need to cancel or reschedule your appointment.   If you are experiencing a Mental Health or [place] or need someone to talk to, please call the Suicide and Crisis Lifeline: 988 call 1-800-273-TALK (toll free, 24 hour hotline)  Patient verbalizes understanding of  instructions and care plan provided today and agrees to view in [place]. Active MyChart status and patient understanding of how to access instructions and care plan via MyChart confirmed with patient.     Quinn Plowman RN,BSN,CCM [facility] [phone] direct line

## 2022-02-28 NOTE — Patient Outreach (Signed)
  Care Coordination   Initial Visit Note   02/28/2022 Name: Paige Hester MRN: [id number] DOB: 1955-05-24  Paige Hester is a 65 y.o. year old female who sees Pleas Koch, NP for primary care. I spoke with  Delia Heady by phone today.  What matters to the patients health and wellness today?  Managing carpal tunnel symptoms and having follow up regarding possible rheumatoid arthritis     Goals Addressed             This Visit's Progress    Patient stated:  Management of carpal tunnel       Care Coordination Interventions: Evaluation of current treatment plan related to carpal tunnel syndrome and patient's adherence to plan as established by provider.  Patient states she is currently under treatment for carpal tunnel and further evaluation for possible rheumatoid arthritis. She states she currently wears a brace to her right wrist during the day and her left wrist at hs.  Patient reports pain level most days is wrists is a 10.  Reports only taking over the counter tylenol for pain management.  Reviewed medications with patient and discussed importance of compliance Reviewed scheduled/upcoming provider appointments:  Patient states she has a follow up visit with the orthopedic provider on 03/05/22 and new patient visit with rheumatologist in June 2024.  Discussed plans with patient for ongoing care management follow up and provided patient with direct contact information for care management team:  Next follow up visit with RNCM is scheduled for 03/25/22.  Discussed and offered care coordination services. Patient verbally agreed to care coordination services and follow up from [facility].  Referral to [employer] guide for food security.  Patient states she is interested in meals on wheels.  Education article on Carpal Tunnel sent to patient in [place].           SDOH assessments and interventions completed:  Yes  SDOH Interventions Today    Flowsheet Row  Most Recent Value  SDOH Interventions   Food Insecurity Interventions Intervention Not Indicated  Housing Interventions Intervention Not Indicated  Transportation Interventions Intervention Not Indicated        Care Coordination Interventions:  Yes, provided   Follow up plan: Follow up call scheduled for 03/25/22    Encounter Outcome:  Pt. Visit Completed   Quinn Plowman RN,BSN,CCM [place] [phone] direct line

## 2022-03-13 ENCOUNTER — Telehealth: Payer: Self-pay

## 2022-03-13 NOTE — Telephone Encounter (Signed)
   Telephone encounter was:  Unsuccessful.  03/13/2022 Name: Paige Hester MRN: [id number] DOB: 09/05/1955  Unsuccessful outbound call made today to assist with:  Food Insecurity  Outreach Attempt:  1st Attempt  A HIPAA compliant voice message was left requesting a return call.  Instructed patient to call back .    [place], Care Management  [phone] [address], [place], [place] [address] Phone: [phone] Email: [email or web address]

## 2022-03-14 ENCOUNTER — Telehealth: Payer: Self-pay

## 2022-03-14 NOTE — Telephone Encounter (Signed)
.  cg

## 2022-03-18 ENCOUNTER — Telehealth: Payer: Self-pay

## 2022-03-18 NOTE — Telephone Encounter (Signed)
   Telephone encounter was:  Successful.  03/18/2022 Name: Paige Hester MRN: [id number] DOB: 1955/04/18  Paige Hester is a 67 y.o. year old female who is a primary care patient of Pleas Koch, NP . The community resource team was consulted for assistance with Food Insecurity and Financial Difficulties related to Financial Strain  Care guide performed the following interventions: Mailed resources  Follow Up Plan:  No further follow up planned at this time. The patient has been provided with needed resources.    [place], Care Management  [phone] [address], [place], [place] [address] Phone: [phone] Email: [email or web address]

## 2022-03-22 ENCOUNTER — Telehealth: Payer: Self-pay | Admitting: Cardiovascular Disease

## 2022-03-24 NOTE — Telephone Encounter (Signed)
Please schedule 12 month F/U appointment for 90 day refills. Thank you! 

## 2022-03-25 ENCOUNTER — Ambulatory Visit: Payer: Self-pay

## 2022-03-25 ENCOUNTER — Telehealth: Payer: Self-pay

## 2022-03-25 NOTE — Patient Outreach (Signed)
  Care Coordination   Follow Up Visit Note   03/25/2022 Name: Paige Hester MRN: [id number] DOB: [birth year]  Paige Hester is a 67 y.o. year old female who sees Pleas Koch, NP for primary care. I spoke with  Delia Heady by phone today.  What matters to the patients health and wellness today?  Management of carpal tunnel symptoms.     Goals Addressed             This Visit's Progress    Patient stated:  Management of carpal tunnel       Care Coordination Interventions: Evaluation of current treatment plan related to carpal tunnel syndrome and patient's adherence to plan as established by provider.  Patient reports having follow up visit with neurologist and rheumatologist.  She states she will start physical therapy on 03/27/22 and has completed prednisone treatment.  Patient states she continues to wear her wrist brace. Patient reports receiving contact from care guide regarding food/ financial concerns.  Reviewed medications with patient and discussed importance of compliance Reviewed scheduled/upcoming provider appointments: Per chart review rheumatology visit scheduled in June 2024.    Discussed plans with patient for ongoing care management follow up and provided patient with direct contact information for care management team:  Next follow up visit with RNCM is scheduled for 05/27/22  Advised patient to take more frequent breaks to rest her hands, avoid activities that make symptoms worse, and apply cold packs to reduce swelling.            SDOH assessments and interventions completed:  No     Care Coordination Interventions:  Yes, provided   Follow up plan: Follow up call scheduled for 05/27/22    Encounter Outcome:  Pt. Visit Completed   Quinn Plowman RN,BSN,CCM [place] [phone] direct line

## 2022-03-26 NOTE — Patient Outreach (Signed)
  Care Coordination   Follow Up Visit Note    03/26/2022 Late entry for 03/25/22 Name: Paige Hester MRN: [id number] DOB: 02/16/1956  Paige Hester is a 67 y.o. year old female who sees Pleas Koch, NP for primary care. I spoke with  Delia Heady by phone today.  What matters to the patients health and wellness today?  Managing carpal tunnel pain/ treatment.     Goals Addressed             This Visit's Progress    Patient stated:  Management of carpal tunnel       Care Coordination Interventions: Evaluation of current treatment plan related to carpal tunnel syndrome and patient's adherence to plan as established by provider.  Patient reports having follow up visit with neurologist and rheumatologist.  She states she will start physical therapy on 03/27/22 and has completed prednisone treatment.  Patient states she continues to wear her wrist brace. Patient reports receiving contact from care guide regarding food/ financial concerns.  Reviewed medications with patient and discussed importance of compliance Reviewed scheduled/upcoming provider appointments: Per chart review rheumatology visit scheduled in June 2024.    Discussed plans with patient for ongoing care management follow up and provided patient with direct contact information for care management team:  Next follow up visit with RNCM is scheduled for 05/27/22  Advised patient to take more frequent breaks to rest her hands, avoid activities that make symptoms worse, and apply cold packs to reduce swelling.            SDOH assessments and interventions completed:  No     Care Coordination Interventions:  Yes, provided   Follow up plan: Follow up call scheduled for 05/27/22    Encounter Outcome:  Pt. Visit Completed   Quinn Plowman RN,BSN,CCM [place] [phone] direct line

## 2022-03-27 ENCOUNTER — Telehealth: Payer: Self-pay | Admitting: Cardiovascular Disease

## 2022-03-27 NOTE — Telephone Encounter (Signed)
LVM

## 2022-03-27 NOTE — Telephone Encounter (Signed)
Patient returned call to Lauren to follow-up on getting her annual Carotid test ordered.

## 2022-03-31 NOTE — Telephone Encounter (Signed)
Noted  

## 2022-03-31 NOTE — Telephone Encounter (Signed)
Schools, Arsenio Katz  Sent: Mon March 31, 2022 10:38 AM  To: Emily Filbert, RN         Message  Hey, I left VM again for patient to reschedule.

## 2022-03-31 NOTE — Telephone Encounter (Signed)
LVM

## 2022-04-04 NOTE — Telephone Encounter (Signed)
Pt is scheduled on 05/14/2022.

## 2022-04-04 NOTE — Telephone Encounter (Signed)
Called 2x today and went straight to voicemail, left message to call and schedule follow up appt.

## 2022-04-04 NOTE — Telephone Encounter (Signed)
Paige Hester, the call center reached out to me about 20 minutes ago asking if I called this patient,  I advised I had not, but by the time I realized it was you and told them that, they may have let her off the line, but she did try to call back.

## 2022-04-14 ENCOUNTER — Other Ambulatory Visit: Payer: Self-pay | Admitting: Cardiovascular Disease

## 2022-04-14 ENCOUNTER — Encounter: Payer: Self-pay | Admitting: Cardiovascular Disease

## 2022-04-15 NOTE — Telephone Encounter (Signed)
Per Dr. Rockey Situ, patient is to take one 2.5 mg tablet 2 hours prior to flying and 1 tablet later that evening and do the same on her return flight. Also advised patient to wear compression hose while on the plane. Patient states she does wear compression hose. Patient expressed gratitude for the call and will pick up one sample box.  Medication Samples have been provided to the patient.  Drug name: Eliquis       Strength: 2.5 mg        Qty: 1 box (14 tablets)  LOT: [id number]  Exp.Date: 06/2022  Rene Paci McClain 9:05 AM 04/15/2022

## 2022-05-06 ENCOUNTER — Ambulatory Visit (INDEPENDENT_AMBULATORY_CARE_PROVIDER_SITE_OTHER): Payer: Medicare Other | Admitting: Primary Care

## 2022-05-06 ENCOUNTER — Encounter: Payer: Self-pay | Admitting: Primary Care

## 2022-05-06 VITALS — BP 128/70 | HR 72 | Ht 65.0 in | Wt 273.0 lb

## 2022-05-06 DIAGNOSIS — G4733 Obstructive sleep apnea (adult) (pediatric): Secondary | ICD-10-CM

## 2022-05-06 NOTE — Progress Notes (Signed)
Reviewed and agree with assessment/plan.   Chesley Mires, MD [facility] Pulmonary/Critical Care 05/06/2022, 4:12 PM Pager:  [phone]
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
## 2022-05-06 NOTE — Patient Instructions (Signed)
Good compliance with CPAP, continue to wear every night for 4-6 hours or longer Sleep apnea is well controlled on current pressure settings No changes recommended Continue to work on weight loss efforts   Follow-up: 1 year with [name] NP   CPAP and BIPAP Information CPAP and BIPAP are methods that use air pressure to keep your airways open and to help you breathe well. CPAP and BIPAP use different amounts of pressure. Your health care provider will tell you whether CPAP or BIPAP would be more helpful for you. CPAP stands for "continuous positive airway pressure." With CPAP, the amount of pressure stays the same while you breathe in (inhale) and out (exhale). BIPAP stands for "bi-level positive airway pressure." With BIPAP, the amount of pressure will be higher when you inhale and lower when you exhale. This allows you to take larger breaths. CPAP or BIPAP may be used in the hospital, or your health care provider may want you to use it at home. You may need to have a sleep study before your health care provider can order a machine for you to use at home. What are the advantages? CPAP or BIPAP can be helpful if you have: Sleep apnea. Chronic obstructive pulmonary disease (COPD). Heart failure. Medical conditions that cause muscle weakness, including muscular dystrophy or amyotrophic lateral sclerosis (ALS). Other problems that cause breathing to be shallow, weak, abnormal, or difficult. CPAP and BIPAP are most commonly used for obstructive sleep apnea (OSA) to keep the airways from collapsing when the muscles relax during sleep. What are the risks? Generally, this is a safe treatment. However, problems may occur, including: Irritated skin or skin sores if the mask does not fit properly. Dry or stuffy nose or nosebleeds. Dry mouth. Feeling gassy or bloated. Sinus or lung infection if the equipment is not cleaned properly. When should CPAP or BIPAP be used? In most cases, the mask only needs  to be worn during sleep. Generally, the mask needs to be worn throughout the night and during any daytime naps. People with certain medical conditions may also need to wear the mask at other times, such as when they are awake. Follow instructions from your health care provider about when to use the machine. What happens during CPAP or BIPAP?  Both CPAP and BIPAP are provided by a small machine with a flexible plastic tube that attaches to a plastic mask that you wear. Air is blown through the mask into your nose or mouth. The amount of pressure that is used to blow the air can be adjusted on the machine. Your health care provider will set the pressure setting and help you find the best mask for you. Tips for using the mask Because the mask needs to be snug, some people feel trapped or closed-in (claustrophobic) when first using the mask. If you feel this way, you may need to get used to the mask. One way to do this is to hold the mask loosely over your nose or mouth and then gradually apply the mask more snugly. You can also gradually increase the amount of time that you use the mask. Masks are available in various types and sizes. If your mask does not fit well, talk with your health care provider about getting a different one. Some common types of masks include: Full face masks, which fit over the mouth and nose. Nasal masks, which fit over the nose. Nasal pillow or prong masks, which fit into the nostrils. If you are using a  mask that fits over your nose and you tend to breathe through your mouth, a chin strap may be applied to help keep your mouth closed. Use a skin barrier to protect your skin as told by your health care provider. Some CPAP and BIPAP machines have alarms that may sound if the mask comes off or develops a leak. If you have trouble with the mask, it is very important that you talk with your health care provider about finding a way to make the mask easier to tolerate. Do not stop  using the mask. There could be a negative impact on your health if you stop using the mask. Tips for using the machine Place your CPAP or BIPAP machine on a secure table or stand near an electrical outlet. Know where the on/off switch is on the machine. Follow instructions from your health care provider about how to set the pressure on your machine and when you should use it. Do not eat or drink while the CPAP or BIPAP machine is on. Food or fluids could get pushed into your lungs by the pressure of the CPAP or BIPAP. For home use, CPAP and BIPAP machines can be rented or purchased through home health care companies. Many different brands of machines are available. Renting a machine before purchasing may help you find out which particular machine works well for you. Your health insurance company may also decide which machine you may get. Keep the CPAP or BIPAP machine and attachments clean. Ask your health care provider for specific instructions. Check the humidifier if you have a dry stuffy nose or nosebleeds. Make sure it is working correctly. Follow these instructions at home: Take over-the-counter and prescription medicines only as told by your health care provider. Ask if you can take sinus medicine if your sinuses are blocked. Do not use any products that contain nicotine or tobacco. These products include cigarettes, chewing tobacco, and vaping devices, such as e-cigarettes. If you need help quitting, ask your health care provider. Keep all follow-up visits. This is important. Contact a health care provider if: You have redness or pressure sores on your head, face, mouth, or nose from the mask or head gear. You have trouble using the CPAP or BIPAP machine. You cannot tolerate wearing the CPAP or BIPAP mask. Someone tells you that you snore even when wearing your CPAP or BIPAP. Get help right away if: You have trouble breathing. You feel confused. Summary CPAP and BIPAP are methods that  use air pressure to keep your airways open and to help you breathe well. If you have trouble with the mask, it is very important that you talk with your health care provider about finding a way to make the mask easier to tolerate. Do not stop using the mask. There could be a negative impact to your health if you stop using the mask. Follow instructions from your health care provider about when to use the machine. This information is not intended to replace advice given to you by your health care provider. Make sure you discuss any questions you have with your health care provider. Document Revised: 10/10/2020 Document Reviewed: 02/10/2020 Elsevier Patient Education  [place].

## 2022-05-06 NOTE — Assessment & Plan Note (Signed)
-   Split night sleep study on 01/29/21 showed moderate OSA, event mostly during REM/ RDI 22/hr. Patient is 71% compliant with CPAP use > 4 hours last 90 days. She reports benefit from use. She temporarily tried oral appliance but did not tolerate. Current CPAP pressure 5-15cm h20 (9.3cm h20-95%); Residual AHI 1.5/hour. No changes today. Advised patient continue to wear CPAP nightly 4-6 hours or longer. Encourage weight loss efforts as able. FU in 1 year or sooner if needed.

## 2022-05-12 NOTE — Telephone Encounter (Signed)
Pt called asking if Paige Hester could also submit a referral too? Call back # [id number]

## 2022-05-14 ENCOUNTER — Ambulatory Visit: Payer: Medicare Other | Attending: Cardiovascular Disease | Admitting: Cardiovascular Disease

## 2022-05-14 ENCOUNTER — Encounter: Payer: Self-pay | Admitting: Cardiovascular Disease

## 2022-05-14 VITALS — BP 150/80 | HR 62 | Ht 65.0 in | Wt 264.2 lb

## 2022-05-14 DIAGNOSIS — R0602 Shortness of breath: Secondary | ICD-10-CM | POA: Diagnosis present

## 2022-05-14 DIAGNOSIS — I272 Pulmonary hypertension, unspecified: Secondary | ICD-10-CM | POA: Insufficient documentation

## 2022-05-14 DIAGNOSIS — E782 Mixed hyperlipidemia: Secondary | ICD-10-CM

## 2022-05-14 DIAGNOSIS — I700 Atherosclerosis of aorta: Secondary | ICD-10-CM | POA: Insufficient documentation

## 2022-05-14 DIAGNOSIS — I10 Essential (primary) hypertension: Secondary | ICD-10-CM

## 2022-05-14 DIAGNOSIS — I739 Peripheral vascular disease, unspecified: Secondary | ICD-10-CM | POA: Insufficient documentation

## 2022-05-14 MED ORDER — ROSUVASTATIN CALCIUM 20 MG PO TABS: 20.0000 mg | ORAL_TABLET | Freq: Every day | ORAL | 2 refills | Status: DC

## 2022-05-14 NOTE — Progress Notes (Signed)
Cardiology Office Note  Date:  05/14/2022   ID:  [name], DOB 1955-09-26, MRN [id number]  PCP:  Pleas Koch, NP   Chief Complaint  Patient presents with   12 month follow up     "Doing well." Medications reviewed by the patient verbally.     HPI:  [name] is a 67 year old woman with past medical history of Abdominal pain Prior smoker, quit 1999 (smoked 37 years) Acute PE April 2022 moderate aortic atheroma in the arch, mild distally, at least moderate coronary calcification noted on CT scan Presenting for f/u of his pulmonary embolism, PAD, aortic atheroma  Last seen by myself in clinic January 2023 Recent travel to [place] to see grandchildren Took Eliquis 2.5 prophylactic dosing for DVT prevention Now back from her travels, has a sore neck on the right Thinks it could have been aggravated by lifting her grandchild  Denies significant chest pain Blood pressure elevated in the office which she feels is from neck pain, going to the wrong building, rushing this morning No improvement on recheck Took her medications this morning, amlodipine 10 with losartan HCTZ 100/25  Takes 2 over-the-counter potassium daily  Denies chest pain or shortness of breath concerning for angina  Lab work reviewed A1c 6.2 Total cholesterol 123 LDL 58  EKG personally reviewed by myself on todays visit Shows normal sinus rhythm rate 62 bpm no significant ST or T wave changes  Other past medical history reviewed Traveling back and forth to [place], car and train SOB sx in Jan 2022 Swelling feb and march Seen in the hospital April 2022  intermittent lower extremity swelling and dyspnea on exertion, worse the past several days after a long car drive to [place].    CTA shows small subsegmental PE w/o evidence right heart strain.  Monitored overnight  tentative plan for minimum of 3 months anticoagulation.  CT scan chest July 11, 2020, 1. Positive for isolated  subsegmental pulmonary embolus in the left upper lobe. 2. Prominence of the main pulmonary artery suggesting pulmonary arterial hypertension. 3. Cardiomegaly with coronary artery calcifications. 4. Mild heterogeneous pulmonary parenchyma, can be seen with small airways disease. 5. Incidental note of gallstones.  Echocardiogram April 2022  1. Left ventricular ejection fraction, by estimation, is 60 to 65%. The  left ventricle has normal function. The left ventricle has no regional  wall motion abnormalities. Left ventricular diastolic parameters are  consistent with Grade II diastolic  dysfunction (pseudonormalization). The average left ventricular global  longitudinal strain is -15.5 %.   2. Right ventricular systolic function is normal. The right ventricular  size is normal.   3. Left atrial size was mildly dilated.    PMH:   has a past medical history of Allergy, Bilateral swelling of feet and ankles, Gallbladder polyp (2012), GERD (gastroesophageal reflux disease), Hyperlipidemia, Hypertension, Iron deficiency anemia, Menopausal symptoms, Murmur, cardiac, Other fatigue, Prediabetes, Pulmonary embolism ([place]) (06/2020), Shortness of breath, Shortness of breath on exertion, Sleep apnea, and Vitamin D deficiency.  PSH:    Past Surgical History:  Procedure Laterality Date   COLONOSCOPY  2010   2020   HYSTEROSCOPY WITH D & C N/A 07/27/2019   Procedure: DILATATION AND CURETTAGE /HYSTEROSCOPY, Polypectomy;  Surgeon: Osborne Oman, MD;  Location: [facility];  Service: Gynecology;  Laterality: N/A;   HYSTEROSCOPY WITH D & C N/A 06/26/2021   Procedure: DILATATION AND CURETTAGE /HYSTEROSCOPY;  Surgeon: Osborne Oman, MD;  Location: [facility];  Service: Gynecology;  Laterality: N/A;   INTRAUTERINE DEVICE (IUD) INSERTION N/A 06/26/2021   Procedure: INTRAUTERINE DEVICE (IUD) INSERTION;  Surgeon: Osborne Oman, MD;  Location: [facility];   Service: Gynecology;  Laterality: N/A;    Current Outpatient Medications  Medication Sig Dispense Refill   acetaminophen (TYLENOL) 650 MG CR tablet Take 1,300 mg by mouth every 8 (eight) hours as needed for pain.     albuterol (VENTOLIN HFA) 108 (90 Base) MCG/ACT inhaler Inhale 2 puffs into the lungs every 6 (six) hours as needed for wheezing or shortness of breath. 8 g 2   amLODipine (NORVASC) 10 MG tablet Take 1 tablet by mouth once daily for blood pressure 90 tablet 2   aspirin EC 81 MG tablet Take 81 mg by mouth daily. Swallow whole.     Cholecalciferol (VITAMIN D3) 1000 UNITS tablet Take 1,000 Units by mouth daily.     fluticasone (FLONASE) 50 MCG/ACT nasal spray Place 1 spray into both nostrils 2 (two) times daily as needed for allergies or rhinitis. 16 g 0   loratadine (CLARITIN) 10 MG tablet Take 10 mg by mouth daily.     losartan-hydrochlorothiazide (HYZAAR) 100-25 MG tablet Take 1 tablet by mouth daily. for blood pressure. 90 tablet 3   Melatonin 10 MG TABS Take by mouth as needed.     methocarbamol (ROBAXIN) 500 MG tablet Take 500 mg by mouth every 6 (six) hours as needed.     omeprazole (PRILOSEC) 20 MG capsule Take 20 mg by mouth every other day.     Potassium Bicarbonate 99 MG CAPS Take 1 tablet by mouth in the morning and at bedtime.     predniSONE (DELTASONE) 10 MG tablet Take by mouth as directed.     senna (SENNA-TIME) 8.6 MG tablet Take 1 tablet by mouth 2 (two) times daily.     Turmeric 400 MG CAPS Take 400 mg by mouth daily.     docusate sodium (COLACE) 100 MG capsule Take 1 capsule (100 mg total) by mouth 2 (two) times daily as needed for mild constipation or moderate constipation. (Patient not taking: Reported on 05/14/2022) 30 capsule 2   methylPREDNISolone (MEDROL DOSEPAK) 4 MG TBPK tablet Take 4 mg by mouth as directed. (Patient not taking: Reported on 05/14/2022)     rosuvastatin (CRESTOR) 20 MG tablet Take 1 tablet (20 mg total) by mouth daily. 90 tablet 2   No  current facility-administered medications for this visit.    Allergies:   Patient has no known allergies.   Social History:  The patient  reports that she quit smoking about 25 years ago. Her smoking use included cigarettes. She has a 27.00 pack-year smoking history. She has never used smokeless tobacco. She reports current alcohol use of about 7.0 standard drinks of alcohol per week. She reports that she does not use drugs.   Family History:   family history includes Cancer in her father; Colon cancer in her maternal aunt; Coronary artery disease in her sister; Coronary artery disease (age of onset: 3) in her sister; Depression in her mother and another family member; Heart attack (age of onset: 25) in her mother; Heart disease in her brother and mother; Heart disease (age of onset: 56) in her father; Hypertension in her mother; Prostate cancer in her father; Stroke in her mother; Sudden death in her mother and another family member.    Review of Systems: Review of Systems  Constitutional: Negative.   HENT: Negative.  Respiratory: Negative.    Cardiovascular:  Positive for leg swelling.  Gastrointestinal: Negative.   Musculoskeletal: Negative.   Neurological: Negative.   Psychiatric/Behavioral: Negative.    All other systems reviewed and are negative.   PHYSICAL EXAM: VS:  BP (!) 150/80 (BP Location: Right Arm)   Pulse 62   Ht '5\' 5"'$  (1.651 m)   Wt 264 lb 4 oz (119.9 kg)   SpO2 97%   BMI 43.97 kg/m  , BMI Body mass index is 43.97 kg/m. Constitutional:  oriented to person, place, and time. No distress.  HENT:  Head: Grossly normal Eyes:  no discharge. No scleral icterus.  Neck: No JVD, no carotid bruits  Cardiovascular: Regular rate and rhythm, 2/6 systolic ejection murmur right sternal border radiating into carotids  Pulmonary/Chest: Clear to auscultation bilaterally, no wheezes or rails Abdominal: Soft.  no distension.  no tenderness.  Musculoskeletal: Normal range of  motion Neurological:  normal muscle tone. Coordination normal. No atrophy Skin: Skin warm and dry Psychiatric: normal affect, pleasant  Recent Labs: 06/24/2021: Platelets 294 06/26/2021: Hemoglobin 12.6 10/30/2021: ALT 10 01/17/2022: BUN 13; Creatinine, Ser 0.84; Potassium 3.7; Sodium 140    Lipid Panel Lab Results  Component Value Date   CHOL 123 10/30/2021   HDL 51.60 10/30/2021   LDLCALC 58 10/30/2021   TRIG 68.0 10/30/2021    Wt Readings from Last 3 Encounters:  05/14/22 264 lb 4 oz (119.9 kg)  05/06/22 273 lb (123.8 kg)  01/31/22 268 lb (121.6 kg)     ASSESSMENT AND PLAN:  Problem List Items Addressed This Visit       Cardiology Problems   Essential hypertension   Relevant Medications   aspirin EC 81 MG tablet   rosuvastatin (CRESTOR) 20 MG tablet   Other Relevant Orders   EKG 12-Lead   Aortic atherosclerosis (HCC)   Relevant Medications   aspirin EC 81 MG tablet   rosuvastatin (CRESTOR) 20 MG tablet   Other Relevant Orders   EKG 12-Lead   Hyperlipidemia   Relevant Medications   aspirin EC 81 MG tablet   rosuvastatin (CRESTOR) 20 MG tablet   Other Relevant Orders   EKG 12-Lead   Pulmonary hypertension (HCC)   Relevant Medications   aspirin EC 81 MG tablet   rosuvastatin (CRESTOR) 20 MG tablet   Other Relevant Orders   EKG 12-Lead   Other Visit Diagnoses     PAD (peripheral artery disease) ([place])    -  Primary   Relevant Medications   aspirin EC 81 MG tablet   rosuvastatin (CRESTOR) 20 MG tablet   Other Relevant Orders   EKG 12-Lead   SOB (shortness of breath)       Relevant Orders   EKG 12-Lead     Pulmonary embolism January and February 2022 after car trip, plane rides with DVT and PE prophylactic treatment with compression hose, dosing with Eliquis 2.5 twice daily as needed for long car trips or plane rides Samples of Eliquis provided  Essential hypertension Blood pressure elevated on today's visit, previously well-controlled No changes  made to her medications but recommended she monitor blood pressure closely at home and call us with numbers if it continues to run high  Carotid/subclavian disease, bruit appreciated last done in 6/22 Mild plaque bilaterally less than 50% range No significant change  Aortic atherosclerosis At least moderate aortic atheroma in the arch, mild distally, at least moderate coronary calcification noted Cholesterol at goal  Aortic valve sclerosis/murmur Murmur appreciated on exam,  radiating to carotids    Total encounter time more than 30 minutes  Greater than 50% was spent in counseling and coordination of care with the patient    Signed, Esmond Plants, M.D., Ph.D. [place], [place]

## 2022-05-14 NOTE — Patient Instructions (Addendum)
Medication Instructions:  No changes  Sample box of eliquis 2.5 to take PRN for travel  Eliquis 2.5 mg tablet 1 box Lot: [id number] Exp:10/2026  If you need a refill on your cardiac medications before your next appointment, please call your pharmacy.   Lab work: No new labs needed  Testing/Procedures: No new testing needed  Follow-Up: At [facility], you and your health needs are our priority.  As part of our continuing mission to provide you with exceptional heart care, we have created designated Provider Care Teams.  These Care Teams include your primary Cardiologist (physician) and Advanced Practice Providers (APPs -  Physician Assistants and Nurse Practitioners) who all work together to provide you with the care you need, when you need it.  You will need a follow up appointment in 12 months  Providers on your designated Care Team:   Murray Hodgkins, NP Christell Faith, PA-C Cadence Kathlen Mody, [place]  COVID-19 Vaccine Information can be found at: [email or web address] For questions related to vaccine distribution or appointments, please email [email or web address] or call [phone].

## 2022-05-19 ENCOUNTER — Other Ambulatory Visit: Payer: Self-pay | Admitting: Family Medicine

## 2022-05-19 DIAGNOSIS — M5412 Radiculopathy, cervical region: Secondary | ICD-10-CM

## 2022-05-26 ENCOUNTER — Ambulatory Visit: Payer: Medicare Other

## 2022-05-26 ENCOUNTER — Ambulatory Visit
Admission: RE | Admit: 2022-05-26 | Discharge: 2022-05-26 | Disposition: A | Discharge status: Home / Self Care | Payer: Medicare Other | Source: Ambulatory Visit | Attending: Family Medicine | Admitting: Family Medicine

## 2022-05-26 DIAGNOSIS — M5412 Radiculopathy, cervical region: Secondary | ICD-10-CM | POA: Diagnosis present

## 2022-05-27 ENCOUNTER — Ambulatory Visit: Payer: Self-pay

## 2022-05-27 NOTE — Patient Outreach (Signed)
  Care Coordination   Follow Up Visit Note   05/27/2022 Name: Paige Hester MRN: [id number] DOB: June 20, 1955  Paige Hester is a 67 y.o. year old female who sees Pleas Koch, NP for primary care. I spoke with  Delia Heady by phone today.  What matters to the patients health and wellness today?  Patient states her carpal tunnel pain is under control. She states as long as she wears her wrist splints it helps to minimize the pain.  Patient states she continues to wear the splints at night and occasionally during the day.  She states she has elected to not have surgery for carpal tunnel.  Patient states has cervical neck pain. She reports her pain level ranges from an 8-10.  Patient reports she takes tylenol and robaxin for cervical pain management.  She states the pain medication brings her pain level down to a 5-6.   Patient reports having a MRI on yesterday. She states she is waiting for results.  She states she has a follow up with the Rheumatologist next week.     Goals Addressed             This Visit's Progress    COMPLETED: Patient stated:  Management of carpal tunnel       Care Coordination Interventions: Evaluation of current treatment plan related to carpal tunnel syndrome and patient's adherence to plan as established by provider.   Verbally assessed patient carpal tunnel pain level. Reviewed medications with patient and discussed importance of compliance Reviewed scheduled/upcoming provider appointments: Per chart review rheumatology visit scheduled in June 2024.    Advised patient to take more frequent breaks to rest her hands, avoid activities that make symptoms worse, and apply cold packs to reduce swelling.         Patient Stated:  management of cervical neck pain       Interventions Today    Flowsheet Row Most Recent Value  Chronic Disease   Chronic disease during today's visit Other  [cervical radiculitis.  Evaluation of current treatment plan  related to cervical radiculitis and patient's adherence to plan as establishe by provider.]  General Interventions   General Interventions Discussed/Reviewed General Interventions Reviewed, Doctor Visits  [Discussed most recent rheumatology follow up visit.  Assessed cervical pain level. Assessed patients response to physical therapy]  Doctor Visits Discussed/Reviewed Doctor Visits Reviewed  Jolinda Croak scheduled/ upcoming provider appointments]  Education Interventions   Education Provided Provided Education  Provided Verbal Education On Other  [Advised to do home neck exercises and instructed by physical therapist.  Advised to use hot/ cold packs for pain management.  Sleep on specific neck pillow as recommended.]  Pharmacy Interventions   Pharmacy Dicussed/Reviewed Pharmacy Topics Reviewed  [medications reviewed and compliance discussed.  Verbally assessed effectiveness of pain medication regimen on neck pain.]              SDOH assessments and interventions completed:  No     Care Coordination Interventions:  Yes, provided   Follow up plan: Follow up call scheduled for 06/26/22    Encounter Outcome:  Pt. Visit Completed   Quinn Plowman RN,BSN,CCM [place] [phone] direct line

## 2022-06-02 ENCOUNTER — Other Ambulatory Visit: Payer: Self-pay | Admitting: Primary Care

## 2022-06-02 DIAGNOSIS — E876 Hypokalemia: Secondary | ICD-10-CM

## 2022-06-02 NOTE — Telephone Encounter (Signed)
From: Delia Heady To: Office of Pleas Koch, NP Sent: 06/02/2022 3:25 PM EDT Subject: Medication Renewal Request  Refills have been requested for the following medications:   Potassium Bicarbonate 99 MG CAPS  Patient Comment: I had been purchasing OTC in the past  Preferred pharmacy: [facility] Delivery method: Pickup Preferred pick-up date and time: 06/04/2022 2:30 PM

## 2022-06-03 ENCOUNTER — Other Ambulatory Visit: Payer: Self-pay | Admitting: Family Medicine

## 2022-06-03 DIAGNOSIS — M5412 Radiculopathy, cervical region: Secondary | ICD-10-CM

## 2022-06-04 ENCOUNTER — Other Ambulatory Visit: Payer: Self-pay

## 2022-06-04 MED ORDER — POTASSIUM BICARBONATE 99 MG PO CAPS
99.0000 mg | ORAL_CAPSULE | Freq: Two times a day (BID) | ORAL | 1 refills | Status: DC
  Filled 2022-06-04: qty 180, 90d supply, fill #0

## 2022-06-06 ENCOUNTER — Ambulatory Visit
Admission: RE | Admit: 2022-06-06 | Discharge: 2022-06-06 | Disposition: A | Discharge status: Home / Self Care | Payer: Federal, State, Local not specified - PPO | Source: Ambulatory Visit | Attending: Family Medicine | Admitting: Family Medicine

## 2022-06-06 DIAGNOSIS — M5412 Radiculopathy, cervical region: Secondary | ICD-10-CM

## 2022-06-06 MED ORDER — IOPAMIDOL (ISOVUE-M 300) INJECTION 61%
1.0000 mL | Freq: Once | INTRAMUSCULAR | Status: AC
  Administered 2022-06-06: 1 mL via EPIDURAL

## 2022-06-06 MED ORDER — TRIAMCINOLONE ACETONIDE 40 MG/ML IJ SUSP (RADIOLOGY)
60.0000 mg | Freq: Once | INTRAMUSCULAR | Status: AC
  Administered 2022-06-06: 60 mg via EPIDURAL

## 2022-06-06 NOTE — Discharge Instructions (Signed)

## 2022-06-10 ENCOUNTER — Other Ambulatory Visit: Payer: Medicare Other

## 2022-06-16 ENCOUNTER — Other Ambulatory Visit: Payer: Self-pay | Admitting: Family Medicine

## 2022-06-16 DIAGNOSIS — M159 Polyosteoarthritis, unspecified: Secondary | ICD-10-CM

## 2022-06-16 NOTE — Telephone Encounter (Signed)
Please call patient: Received refill request for methocarbamol muscle relaxer that was original prescribed by someone else.  What is she taking this medication for? How often does she take it?  Also needs CPE scheduled for late August 2024.

## 2022-06-16 NOTE — Telephone Encounter (Signed)
Last office visit 01/31/2022 for carpal tunnel.  Last refilled ?  Listed as historical medication.  No future appointments with PCP.

## 2022-06-17 NOTE — Telephone Encounter (Signed)
Called patient and reviewed all information. Patient verbalized understanding. Will call if any further questions.  

## 2022-06-17 NOTE — Telephone Encounter (Signed)
Please remind her that the methocarbamol is an as needed medication and to use it sparingly as needed.  Requested Prescriptions   Pending Prescriptions Disp Refills   methocarbamol (ROBAXIN) 500 MG tablet [Pharmacy Med Name: Methocarbamol 500 MG Oral Tablet] 60 tablet 0    Sig: TAKE 1 TABLET BY MOUTH EVERY 8 HOURS AS NEEDED FOR MUSCLE SPASM   Refill(s) sent to pharmacy.

## 2022-06-17 NOTE — Telephone Encounter (Signed)
She is right that she may be at a higher risk if she has had a blood clot before but these specific symptoms that she mentioned are not overtly concerning for PE. She should monitor for chest pain, new or worsening shortness of breath, tachycardia at rest, low oxygen levels or hemoptysis. She should reach out to her cardiologist.
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
## 2022-06-25 ENCOUNTER — Telehealth: Payer: Self-pay

## 2022-06-25 NOTE — Patient Outreach (Signed)
  Care Coordination   Follow Up Visit Note   06/25/2022 Name: KES WESTMORELAND MRN: [id number] DOB: 1955/08/31  LARETHA CORNACCHIA is a 67 y.o. year old female who sees Doreene Nest, NP for primary care. I spoke with  Wilhemina Cash by phone today.  What matters to the patients health and wellness today?  Patient states she is doing well. She states her cervical pain level after having the epidural injection has been a 0.  She states she continues doing outpatient PT/ OT.   Patient reports she is managing the carpal tunnel by wearing her wrist braces.  Patient denies any further needs or concerns and is agreeable that care coordination goals have been met.  Patient advised to contact primary care provider or RNCM if care coordination services needed in the future.    Goals Addressed             This Visit's Progress    COMPLETED: Patient Stated:  management of cervical neck pain       Interventions Today    Flowsheet Row Most Recent Value  Chronic Disease   Chronic disease during today's visit Other  [cervical spinal stenosis and carpal tunnel.]  General Interventions   General Interventions Discussed/Reviewed General Interventions Reviewed  [evaluation of current treatment plan for cervical spinal stenosis / carpal tunnel and patients adherence to plan as established by provider.  Assessed patient's pain level post epidural injection and carpal tunnel pain. Assessed for ongoing PT/OT]  Pharmacy Interventions   Pharmacy Dicussed/Reviewed Pharmacy Topics Reviewed  [medications reviewed and compliance discussed.]              SDOH assessments and interventions completed:  No     Care Coordination Interventions:  Yes, provided   Follow up plan: No further intervention required.   Encounter Outcome:  Pt. Visit Completed   George Ina RN,BSN,CCM [facility] Care Coordination [phone] direct line

## 2022-07-03 ENCOUNTER — Encounter: Payer: Self-pay | Admitting: Family Medicine

## 2022-07-03 ENCOUNTER — Ambulatory Visit (INDEPENDENT_AMBULATORY_CARE_PROVIDER_SITE_OTHER): Payer: Medicare Other | Admitting: Family Medicine

## 2022-07-03 VITALS — BP 120/80 | HR 83 | Temp 97.7°F | Ht 65.75 in | Wt 262.2 lb

## 2022-07-03 DIAGNOSIS — M65342 Trigger finger, left ring finger: Secondary | ICD-10-CM

## 2022-07-03 MED ORDER — TRIAMCINOLONE ACETONIDE 40 MG/ML IJ SUSP
20.0000 mg | Freq: Once | INTRAMUSCULAR | Status: AC
  Administered 2022-07-03: 20 mg via INTRA_ARTICULAR

## 2022-07-03 NOTE — Progress Notes (Signed)
    Johnda Billiot T. Nazar Kuan, MD, CAQ Sports Medicine [facility] [address] [place], [address]  Phone: [phone]  FAX: [phone]  Paige Hester - 67 y.o. female  MRN [id number]  Date of Birth: 24-Jul-1955  Date: 07/03/2022  PCP: Doreene Nest, NP  Referral: Doreene Nest, NP  Chief Complaint  Patient presents with   Trigger Finger    Left Ring Finger   Subjective:   Paige Hester is a 67 y.o. very pleasant female patient with Body mass index is 42.65 kg/m. who presents with the following:  Presents for ongoing L 4th trigger finger.   Tendon Sheath Injection Procedure Note Paige Hester January 28, 1956 Date of procedure: 07/03/2022  Procedure: Tendon Sheath Injection for Trigger Finger, L 4th Indications: Pain  Procedure Details Verbal consent was obtained. Risks (including potential risk for skin lightening and potential atrophy), benefits and alternatives were discussed. Prepped with Chloraprep and Ethyl Chloride used for anesthesia. Under sterile conditions, patient injected at palmar crease aiming distally with 45 degree angle towards nodule; injected directly into tendon sheath. Medication flowed freely without resistance.  Needle size: 22 gauge 1 1/2 inch Injection: 1/2 cc of Lidocaine 1% and Kenalog 20 mg Medication: 1/2 cc of Kenalog 40 mg (equaling Kenalog 20 mg)     ICD-10-CM   1. Trigger finger, left ring finger  M65.342 triamcinolone acetonide (KENALOG-40) injection 20 mg      Medication Management during today's office visit: Meds ordered this encounter  Medications   triamcinolone acetonide (KENALOG-40) injection 20 mg     Signed,  Brownie Gockel T. Ahava Kissoon, MD

## 2022-07-15 ENCOUNTER — Encounter: Payer: Self-pay | Admitting: Primary Care

## 2022-07-15 ENCOUNTER — Ambulatory Visit (INDEPENDENT_AMBULATORY_CARE_PROVIDER_SITE_OTHER): Payer: Medicare Other | Admitting: Primary Care

## 2022-07-15 VITALS — BP 134/78 | HR 65 | Temp 98.6°F | Ht 67.5 in | Wt 262.0 lb

## 2022-07-15 DIAGNOSIS — R051 Acute cough: Secondary | ICD-10-CM | POA: Diagnosis not present

## 2022-07-15 MED ORDER — BENZONATATE 200 MG PO CAPS: 200.0000 mg | ORAL_CAPSULE | Freq: Three times a day (TID) | ORAL | 0 refills | Status: DC | PRN

## 2022-07-15 NOTE — Progress Notes (Signed)
Subjective:    Patient ID: Paige Hester, female    DOB: 1955/11/15, 67 y.o.   MRN: [id number]  Cough Associated symptoms include a sore throat. Pertinent negatives include no chills, fever, headaches or postnasal drip.    Paige Hester is a very pleasant 67 y.o. female with a history of hypertension, cardiomegaly, GERD, OSA who presents today   [facility] 06[facility]26/22 for a four day history of URI symptoms including nasal congestion, cough, fatigue, headaches, rhinorrhea, SOB, sore throat. Influenza and Covid-19 testing was negative. She was diagnosed with URI and treated with conservative measures. Symptoms completely resolved from her visit at the [facility].   Five days ago she began to notice voice hoarseness for which she thought was secondary to singing at choir practice the night before. She then developed coughing which is now productive with yellow/grey/green sputum, fatigue, and sore throat. She's not taken anything OTC for symptoms.   Review of Systems  Constitutional:  Positive for fatigue. Negative for chills and fever.  HENT:  Positive for sore throat and voice change. Negative for congestion and postnasal drip.   Respiratory:  Positive for cough.   Neurological:  Negative for headaches.         Past Medical History:  Diagnosis Date   Allergy    Bilateral swelling of feet and ankles    Gallbladder polyp 2012   GERD (gastroesophageal reflux disease)    Hyperlipidemia    Hypertension    Iron deficiency anemia    Menopausal symptoms    since 2007   Murmur, cardiac    Other fatigue    Prediabetes    Pulmonary embolism (HCC) 06/2020   Shortness of breath    with exertion; patient going to pulmonary rehabilitation   Shortness of breath on exertion    Sleep apnea    Uses CPAP   Vitamin D deficiency     Social History   Socioeconomic History   Marital status: Legally Separated    Spouse name: Not on file   Number of children: 1    Years of education: Not on file   Highest education level: Bachelor's degree (e.g., BA, AB, BS)  Occupational History   Occupation: Retired  Tobacco Use   Smoking status: Former    Packs/day: 1.00    Years: 27.00    Additional pack years: 0.00    Total pack years: 27.00    Types: Cigarettes    Quit date: 1999    Years since quitting: 25.3   Smokeless tobacco: Never  Vaping Use   Vaping Use: Never used  Substance and Sexual Activity   Alcohol use: Yes    Alcohol/week: 7.0 standard drinks of alcohol    Types: 7 Glasses of wine per week    Comment: 1-2 glasses of wine per night   Drug use: No   Sexual activity: Yes  Other Topics Concern   Not on file  Social History Narrative   Married.   1 child, 1 grandchild.   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   Enjoys going to [employer], singing.    Social Determinants of Health   Financial Resource Strain: High Risk (07/02/2022)   Overall Financial Resource Strain (CARDIA)    Difficulty of Paying Living Expenses: Hard  Food Insecurity: Food Insecurity Present (07/02/2022)   Hunger Vital Sign    Worried About Running Out of Food in the Last Year: Sometimes true  Ran Out of Food in the Last Year: Sometimes true  Transportation Needs: No Transportation Needs (07/02/2022)   PRAPARE - Administrator, Civil Service (Medical): No    Lack of Transportation (Non-Medical): No  Physical Activity: Insufficiently Active (07/02/2022)   Exercise Vital Sign    Days of Exercise per Week: 1 day    Minutes of Exercise per Session: 10 min  Stress: No Stress Concern Present (07/02/2022)   [employer] of Occupational Health - Occupational Stress Questionnaire    Feeling of Stress : Not at all  Social Connections: Moderately Integrated (07/02/2022)   Social Connection and Isolation Panel [NHANES]    Frequency of Communication with Friends and Family: Three times a week    Frequency of Social  Gatherings with Friends and Family: Once a week    Attends Religious Services: More than 4 times per year    Active Member of Clubs or Organizations: Yes    Attends Banker Meetings: More than 4 times per year    Marital Status: Separated  Intimate Partner Violence: Not At Risk (12/31/2021)   Humiliation, Afraid, Rape, and Kick questionnaire    Fear of Current or Ex-Partner: No    Emotionally Abused: No    Physically Abused: No    Sexually Abused: No    Past Surgical History:  Procedure Laterality Date   COLONOSCOPY  2010   2020   HYSTEROSCOPY WITH D & C N/A 07/27/2019   Procedure: DILATATION AND CURETTAGE /HYSTEROSCOPY, Polypectomy;  Surgeon: Tereso Newcomer, MD;  Location: [facility];  Service: Gynecology;  Laterality: N/A;   HYSTEROSCOPY WITH D & C N/A 06/26/2021   Procedure: DILATATION AND CURETTAGE /HYSTEROSCOPY;  Surgeon: Tereso Newcomer, MD;  Location: [facility];  Service: Gynecology;  Laterality: N/A;   INTRAUTERINE DEVICE (IUD) INSERTION N/A 06/26/2021   Procedure: INTRAUTERINE DEVICE (IUD) INSERTION;  Surgeon: Tereso Newcomer, MD;  Location: [facility];  Service: Gynecology;  Laterality: N/A;    Family History  Problem Relation Age of Onset   Depression Mother    Stroke Mother    Sudden death Mother    Heart disease Mother    Hypertension Mother    Heart attack Mother 58   Cancer Father    Prostate cancer Father    Heart disease Father 15       CAD, STENT   Coronary artery disease Sister        stent x 2   Coronary artery disease Sister 1       stent placed   Colon cancer Maternal Aunt    Heart disease Brother        Myocardial infarction   Sudden death Other    Depression Other    Colon polyps Neg Hx    Esophageal cancer Neg Hx    Rectal cancer Neg Hx    Stomach cancer Neg Hx    Breast cancer Neg Hx     No Known Allergies  Current Outpatient Medications on File Prior to Visit   Medication Sig Dispense Refill   albuterol (VENTOLIN HFA) 108 (90 Base) MCG/ACT inhaler Inhale 2 puffs into the lungs every 6 (six) hours as needed for wheezing or shortness of breath. 8 g 2   amLODipine (NORVASC) 10 MG tablet Take 1 tablet by mouth once daily for blood pressure 90 tablet 2   aspirin EC 81 MG tablet Take 81 mg by mouth daily. Swallow whole.  Cholecalciferol (VITAMIN D3) 1000 UNITS tablet Take 1,000 Units by mouth daily.     docusate sodium (COLACE) 100 MG capsule Take 1 capsule (100 mg total) by mouth 2 (two) times daily as needed for mild constipation or moderate constipation. 30 capsule 2   fluticasone (FLONASE) 50 MCG/ACT nasal spray Place 1 spray into both nostrils 2 (two) times daily as needed for allergies or rhinitis. 16 g 0   loratadine (CLARITIN) 10 MG tablet Take 10 mg by mouth daily.     losartan-hydrochlorothiazide (HYZAAR) 100-25 MG tablet Take 1 tablet by mouth daily. for blood pressure. 90 tablet 3   Melatonin 10 MG TABS Take by mouth as needed.     omeprazole (PRILOSEC) 20 MG capsule Take 20 mg by mouth every other day.     Potassium Bicarbonate 99 MG CAPS Take 1 capsule (99 mg total) by mouth in the morning and at bedtime. 180 capsule 1   rosuvastatin (CRESTOR) 20 MG tablet Take 1 tablet (20 mg total) by mouth daily. 90 tablet 2   senna (SENNA-TIME) 8.6 MG tablet Take 1 tablet by mouth 2 (two) times daily.     Turmeric 400 MG CAPS Take 400 mg by mouth daily.     acetaminophen (TYLENOL) 650 MG CR tablet Take 1,300 mg by mouth every 8 (eight) hours as needed for pain.     No current facility-administered medications on file prior to visit.    BP 134/78   Pulse 65   Temp 98.6 F (37 C) (Temporal)   Ht 5' 7.5" (1.715 m)   Wt 262 lb (118.8 kg)   SpO2 99%   BMI 40.43 kg/m  Objective:   Physical Exam Constitutional:      Appearance: She is ill-appearing.  HENT:     Right Ear: Tympanic membrane and ear canal normal.     Left Ear: Tympanic membrane and  ear canal normal.     Nose:     Right Sinus: No maxillary sinus tenderness or frontal sinus tenderness.     Left Sinus: No maxillary sinus tenderness or frontal sinus tenderness.     Mouth/Throat:     Pharynx: No posterior oropharyngeal erythema.  Eyes:     Conjunctiva/sclera: Conjunctivae normal.  Cardiovascular:     Rate and Rhythm: Normal rate and regular rhythm.  Pulmonary:     Effort: Pulmonary effort is normal.     Breath sounds: Normal breath sounds. No wheezing or rales.     Comments: Congested cough noted during visit  Musculoskeletal:     Cervical back: Neck supple.  Lymphadenopathy:     Cervical: No cervical adenopathy.  Skin:    General: Skin is warm and dry.           Assessment & Plan:  Acute cough Assessment & Plan: Likely viral URI, she did completely recover from prior URI earlier this month.  Will treat conservatively for now.  Start Benzonatate capsules for cough. Take 1 capsule by mouth three times daily as needed for cough. Discussed Floanse and antihistamine.   She will update in 2 days. If symptoms have not improved and/or are worse, then we will consider antibiotic treatment.   Orders: -     Benzonatate; Take 1 capsule (200 mg total) by mouth 3 (three) times daily as needed for cough.  Dispense: 15 capsule; Refill: 0        Doreene Nest, NP

## 2022-07-15 NOTE — Assessment & Plan Note (Signed)
Likely viral URI, she did completely recover from prior URI earlier this month.  Will treat conservatively for now.  Start Benzonatate capsules for cough. Take 1 capsule by mouth three times daily as needed for cough. Discussed Floanse and antihistamine.   She will update in 2 days. If symptoms have not improved and/or are worse, then we will consider antibiotic treatment.

## 2022-07-15 NOTE — Patient Instructions (Signed)
You may take Benzonatate capsules for cough. Take 1 capsule by mouth three times daily as needed for cough.  Please update me in 2 days as discussed.  It was a pleasure to see you today!

## 2022-07-18 ENCOUNTER — Telehealth: Payer: Medicare Other | Admitting: Nurse Practitioner

## 2022-07-18 DIAGNOSIS — J40 Bronchitis, not specified as acute or chronic: Secondary | ICD-10-CM | POA: Diagnosis not present

## 2022-07-18 MED ORDER — AZITHROMYCIN 250 MG PO TABS: ORAL_TABLET | ORAL | 0 refills | Status: AC

## 2022-07-18 NOTE — Progress Notes (Signed)
E-Visit for Cough  We are sorry that you are not feeling well.  Here is how we plan to help!  Based on your presentation I believe you most likely have A cough due to bacteria.  When patients have a fever and a productive cough with a change in color or increased sputum production, we are concerned about bacterial bronchitis.  If left untreated it can progress to pneumonia.  If your symptoms do not improve with your treatment plan it is important that you contact your provider.   I have prescribed Azithromyin 250 mg: two tablets now and then one tablet daily for 4 additonal days    In addition you may use continue to use the benzonatate and Albuterol Inhaler    From your responses in the eVisit questionnaire you describe inflammation in the upper respiratory tract which is causing a significant cough.  This is commonly called Bronchitis and has four common causes:   Allergies Viral Infections Acid Reflux Bacterial Infection Allergies, viruses and acid reflux are treated by controlling symptoms or eliminating the cause. An example might be a cough caused by taking certain blood pressure medications. You stop the cough by changing the medication. Another example might be a cough caused by acid reflux. Controlling the reflux helps control the cough.  USE OF BRONCHODILATOR ("RESCUE") INHALERS: There is a risk from using your bronchodilator too frequently.  The risk is that over-reliance on a medication which only relaxes the muscles surrounding the breathing tubes can reduce the effectiveness of medications prescribed to reduce swelling and congestion of the tubes themselves.  Although you feel brief relief from the bronchodilator inhaler, your asthma may actually be worsening with the tubes becoming more swollen and filled with mucus.  This can delay other crucial treatments, such as oral steroid medications. If you need to use a bronchodilator inhaler daily, several times per day, you should discuss  this with your provider.  There are probably better treatments that could be used to keep your asthma under control.     HOME CARE Only take medications as instructed by your medical team. Complete the entire course of an antibiotic. Drink plenty of fluids and get plenty of rest. Avoid close contacts especially the very young and the elderly Cover your mouth if you cough or cough into your sleeve. Always remember to wash your hands A steam or ultrasonic humidifier can help congestion.   GET HELP RIGHT AWAY IF: You develop worsening fever. You become short of breath You cough up blood. Your symptoms persist after you have completed your treatment plan MAKE SURE YOU  Understand these instructions. Will watch your condition. Will get help right away if you are not doing well or get worse.    Thank you for choosing an e-visit.  Your e-visit answers were reviewed by a board certified advanced clinical practitioner to complete your personal care plan. Depending upon the condition, your plan could have included both over the counter or prescription medications.  Please review your pharmacy choice. Make sure the pharmacy is open so you can pick up prescription now. If there is a problem, you may contact your provider through [employer] and have the prescription routed to another pharmacy.  Your safety is important to [place]. If you have drug allergies check your prescription carefully.   For the next 24 hours you can use MyChart to ask questions about today's visit, request a non-urgent call back, or ask for a work or school excuse. You will  get an email in the next two days asking about your experience. I hope that your e-visit has been valuable and will speed your recovery.   Meds ordered this encounter  Medications   azithromycin (ZITHROMAX) 250 MG tablet    Sig: Take 2 tablets on day 1, then 1 tablet daily on days 2 through 5    Dispense:  6 tablet    Refill:  0    I spent  approximately 5 minutes reviewing the patient's history, current symptoms and coordinating their care today.

## 2022-08-06 ENCOUNTER — Ambulatory Visit (INDEPENDENT_AMBULATORY_CARE_PROVIDER_SITE_OTHER): Payer: Medicare Other | Admitting: Primary Care

## 2022-08-06 ENCOUNTER — Encounter: Payer: Self-pay | Admitting: Primary Care

## 2022-08-06 VITALS — BP 142/78 | HR 70 | Temp 97.7°F | Ht 67.5 in | Wt 257.0 lb

## 2022-08-06 DIAGNOSIS — K219 Gastro-esophageal reflux disease without esophagitis: Secondary | ICD-10-CM

## 2022-08-06 MED ORDER — OMEPRAZOLE 20 MG PO CPDR
20.0000 mg | DELAYED_RELEASE_CAPSULE | Freq: Every day | ORAL | 3 refills | Status: DC
Start: 2022-08-06 — End: 2022-08-26

## 2022-08-06 NOTE — Progress Notes (Signed)
Subjective:    Patient ID: Paige Hester, female    DOB: [birth year], 67 y.o.   MRN: [id number]  HPI  ALEXYZ Hester is a very pleasant 67 y.o. female with a history of GERD, OSA, hypertension, prediabetes who presents today to discuss voice hoarseness.   Symptom onset about 6 weeks ago with voice hoarseness. She then developed a productive cough which lasted for about one week. Since then she's noticed continued hoarse voice.  She sings in her church choir twice weekly. In April she did sing in a convention. She has noticed esophageal burning, belching, acid sensation for the last three days.  She's not taken her omeprazole since early April as she could not afford it. She found out today that her insurance will cover omeprazole if it were in the capsule form.   She denies fevers, cough, post nasal drip, congestion.    Review of Systems  HENT:  Negative for congestion, postnasal drip and sore throat.   Respiratory:  Negative for cough.   Gastrointestinal:        Esophageal burning and belching.         Past Medical History:  Diagnosis Date   Allergy    Bilateral swelling of feet and ankles    Gallbladder polyp 2012   GERD (gastroesophageal reflux disease)    Hyperlipidemia    Hypertension    Iron deficiency anemia    Menopausal symptoms    since 2007   Murmur, cardiac    Other fatigue    Prediabetes    Pulmonary embolism (HCC) 06/2020   Shortness of breath    with exertion; patient going to pulmonary rehabilitation   Shortness of breath on exertion    Sleep apnea    Uses CPAP   Vitamin D deficiency     Social History   Socioeconomic History   Marital status: Legally Separated    Spouse name: Not on file   Number of children: 1   Years of education: Not on file   Highest education level: Bachelor's degree (e.g., BA, AB, BS)  Occupational History   Occupation: Retired  Tobacco Use   Smoking status: Former    Packs/day: 1.00    Years: 27.00     Additional pack years: 0.00    Total pack years: 27.00    Types: Cigarettes    Quit date: 1999    Years since quitting: 25.4   Smokeless tobacco: Never  Vaping Use   Vaping Use: Never used  Substance and Sexual Activity   Alcohol use: Yes    Alcohol/week: 7.0 standard drinks of alcohol    Types: 7 Glasses of wine per week    Comment: 1-2 glasses of wine per night   Drug use: No   Sexual activity: Yes  Other Topics Concern   Not on file  Social History Narrative   Married.   1 child, 1 grandchild.   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   Enjoys going to [employer], singing.    Social Determinants of Health   Financial Resource Strain: High Risk (07/02/2022)   Overall Financial Resource Strain (CARDIA)    Difficulty of Paying Living Expenses: Hard  Food Insecurity: Food Insecurity Present (07/02/2022)   Hunger Vital Sign    Worried About Running Out of Food in the Last Year: Sometimes true    Ran Out of Food in the Last Year: Sometimes true  Transportation Needs: No Transportation Needs (07/02/2022)  PRAPARE - Administrator, Civil Service (Medical): No    Lack of Transportation (Non-Medical): No  Physical Activity: Insufficiently Active (07/02/2022)   Exercise Vital Sign    Days of Exercise per Week: 1 day    Minutes of Exercise per Session: 10 min  Stress: No Stress Concern Present (07/02/2022)   [employer] of Occupational Health - Occupational Stress Questionnaire    Feeling of Stress : Not at all  Social Connections: Moderately Integrated (07/02/2022)   Social Connection and Isolation Panel [NHANES]    Frequency of Communication with Friends and Family: Three times a week    Frequency of Social Gatherings with Friends and Family: Once a week    Attends Religious Services: More than 4 times per year    Active Member of Clubs or Organizations: Yes    Attends Banker Meetings: More than 4 times per year     Marital Status: Separated  Intimate Partner Violence: Not At Risk (12/31/2021)   Humiliation, Afraid, Rape, and Kick questionnaire    Fear of Current or Ex-Partner: No    Emotionally Abused: No    Physically Abused: No    Sexually Abused: No    Past Surgical History:  Procedure Laterality Date   COLONOSCOPY  2010   2020   HYSTEROSCOPY WITH D & C N/A 07/27/2019   Procedure: DILATATION AND CURETTAGE /HYSTEROSCOPY, Polypectomy;  Surgeon: Tereso Newcomer, MD;  Location: [facility];  Service: Gynecology;  Laterality: N/A;   HYSTEROSCOPY WITH D & C N/A 06/26/2021   Procedure: DILATATION AND CURETTAGE /HYSTEROSCOPY;  Surgeon: Tereso Newcomer, MD;  Location: [facility];  Service: Gynecology;  Laterality: N/A;   INTRAUTERINE DEVICE (IUD) INSERTION N/A 06/26/2021   Procedure: INTRAUTERINE DEVICE (IUD) INSERTION;  Surgeon: Tereso Newcomer, MD;  Location: [facility];  Service: Gynecology;  Laterality: N/A;    Family History  Problem Relation Age of Onset   Depression Mother    Stroke Mother    Sudden death Mother    Heart disease Mother    Hypertension Mother    Heart attack Mother 110   Cancer Father    Prostate cancer Father    Heart disease Father 64       CAD, STENT   Coronary artery disease Sister        stent x 2   Coronary artery disease Sister 36       stent placed   Colon cancer Maternal Aunt    Heart disease Brother        Myocardial infarction   Sudden death Other    Depression Other    Colon polyps Neg Hx    Esophageal cancer Neg Hx    Rectal cancer Neg Hx    Stomach cancer Neg Hx    Breast cancer Neg Hx     No Known Allergies  Current Outpatient Medications on File Prior to Visit  Medication Sig Dispense Refill   albuterol (VENTOLIN HFA) 108 (90 Base) MCG/ACT inhaler Inhale 2 puffs into the lungs every 6 (six) hours as needed for wheezing or shortness of breath. 8 g 2   amLODipine (NORVASC) 10 MG tablet Take 1  tablet by mouth once daily for blood pressure 90 tablet 2   aspirin EC 81 MG tablet Take 81 mg by mouth daily. Swallow whole.     Cholecalciferol (VITAMIN D3) 1000 UNITS tablet Take 1,000 Units by mouth daily.  docusate sodium (COLACE) 100 MG capsule Take 1 capsule (100 mg total) by mouth 2 (two) times daily as needed for mild constipation or moderate constipation. 30 capsule 2   fluticasone (FLONASE) 50 MCG/ACT nasal spray Place 1 spray into both nostrils 2 (two) times daily as needed for allergies or rhinitis. 16 g 0   loratadine (CLARITIN) 10 MG tablet Take 10 mg by mouth daily.     losartan-hydrochlorothiazide (HYZAAR) 100-25 MG tablet Take 1 tablet by mouth daily. for blood pressure. 90 tablet 3   Melatonin 10 MG TABS Take by mouth as needed.     Potassium Bicarbonate 99 MG CAPS Take 1 capsule (99 mg total) by mouth in the morning and at bedtime. 180 capsule 1   rosuvastatin (CRESTOR) 20 MG tablet Take 1 tablet (20 mg total) by mouth daily. 90 tablet 2   senna (SENNA-TIME) 8.6 MG tablet Take 1 tablet by mouth 2 (two) times daily.     Turmeric 400 MG CAPS Take 400 mg by mouth daily.     No current facility-administered medications on file prior to visit.    BP (!) 142/78   Pulse 70   Temp 97.7 F (36.5 C) (Temporal)   Ht 5' 7.5" (1.715 m)   Wt 257 lb (116.6 kg)   SpO2 98%   BMI 39.66 kg/m  Objective:   Physical Exam Cardiovascular:     Rate and Rhythm: Normal rate and regular rhythm.  Pulmonary:     Effort: Pulmonary effort is normal.     Breath sounds: Normal breath sounds.  Musculoskeletal:     Cervical back: Neck supple.  Skin:    General: Skin is warm and dry.           Assessment & Plan:  Gastroesophageal reflux disease without esophagitis Assessment & Plan: Uncontrolled, also likely the cause for voice hoarseness  Refills provided for omeprazole 20 mg capsules  Discussed that it may take a few weeks for her voice to return. Consider ENT referral if  needed.  She will update..   Orders: -     Omeprazole; Take 1 capsule (20 mg total) by mouth daily. for heartburn.  Dispense: 90 capsule; Refill: 3        Doreene Nest, NP

## 2022-08-06 NOTE — Assessment & Plan Note (Signed)
Uncontrolled, also likely the cause for voice hoarseness  Refills provided for omeprazole 20 mg capsules  Discussed that it may take a few weeks for her voice to return. Consider ENT referral if needed.  She will update.Marland Kitchen

## 2022-08-06 NOTE — Patient Instructions (Signed)
Resume omeprazole 20 mg daily for heartburn and voice changes.  Please update me in a few weeks.  It was a pleasure to see you today!

## 2022-08-19 ENCOUNTER — Encounter: Payer: Medicare Other | Admitting: Rheumatology

## 2022-08-26 ENCOUNTER — Ambulatory Visit (INDEPENDENT_AMBULATORY_CARE_PROVIDER_SITE_OTHER): Payer: Medicare Other | Admitting: Primary Care

## 2022-08-26 ENCOUNTER — Encounter: Payer: Self-pay | Admitting: Primary Care

## 2022-08-26 VITALS — BP 134/80 | HR 80 | Temp 97.3°F | Ht 67.5 in | Wt 258.0 lb

## 2022-08-26 DIAGNOSIS — R490 Dysphonia: Secondary | ICD-10-CM | POA: Diagnosis not present

## 2022-08-26 DIAGNOSIS — K219 Gastro-esophageal reflux disease without esophagitis: Secondary | ICD-10-CM

## 2022-08-26 MED ORDER — OMEPRAZOLE 20 MG PO CPDR
20.0000 mg | DELAYED_RELEASE_CAPSULE | Freq: Two times a day (BID) | ORAL | 0 refills | Status: DC
Start: 2022-08-26 — End: 2022-10-09

## 2022-08-26 NOTE — Assessment & Plan Note (Addendum)
Improving.  Increase omeprazole to 20 mg twice daily. Encouraged voice rest. Continue Zyrtec 10 mg at bedtime  Consider ENT referral if no improvement, especially given prior smoking history. No alarm signs on exam.

## 2022-08-26 NOTE — Progress Notes (Signed)
Subjective:    Patient ID: Paige Hester, female    DOB: 12-May-1955, 66 y.o.   MRN: [id number]  HPI  Paige Hester is a very pleasant 67 y.o. female with a history of hypertension, OSA, GERD, prediabetes who presents today to discuss hoarse voice.  She was last evaluated by me on 08/06/2022 for a 6-week history of voice hoarseness.  She sings quite frequently in her church choir, did sing at a convention prior to her visit in April.  During her last visit we discussed to resume omeprazole 20 mg daily, voice rest.  Since her last visit her voice hoarseness has improved but her voice has not returned to normal.  She is compliant to omeprazole 20 mg daily which has helped. She hasn't been singing as much in her choir.   Upon waking in the morning her voice is the hoarsest. During choir practice she's noticed that she cannot hit certain notes that she typically could hit.   She was a prior smoker, smoked from the age of 67 through 73, quit around the age of 71.   She denies unexplained weight loss, hemoptysis, neck swelling, adenopathy. She does experience post drip, takes Zyrtec 10 mg HS. She alternates between Claritin and Zytrec.   Review of Systems  Constitutional:  Negative for unexpected weight change.  HENT:  Positive for postnasal drip and voice change. Negative for sore throat.   Allergic/Immunologic: Positive for environmental allergies.         Past Medical History:  Diagnosis Date   Allergy    Bilateral swelling of feet and ankles    Gallbladder polyp 2012   GERD (gastroesophageal reflux disease)    Hyperlipidemia    Hypertension    Iron deficiency anemia    Menopausal symptoms    since 2007   Murmur, cardiac    Other fatigue    Prediabetes    Pulmonary embolism (HCC) 06/2020   Shortness of breath    with exertion; patient going to pulmonary rehabilitation   Shortness of breath on exertion    Sleep apnea    Uses CPAP   Vitamin D deficiency      Social History   Socioeconomic History   Marital status: Legally Separated    Spouse name: Not on file   Number of children: 1   Years of education: Not on file   Highest education level: Bachelor's degree (e.g., BA, AB, BS)  Occupational History   Occupation: Retired  Tobacco Use   Smoking status: Former    Packs/day: 1.00    Years: 27.00    Additional pack years: 0.00    Total pack years: 27.00    Types: Cigarettes    Quit date: 1999    Years since quitting: 25.4   Smokeless tobacco: Never  Vaping Use   Vaping Use: Never used  Substance and Sexual Activity   Alcohol use: Yes    Alcohol/week: 7.0 standard drinks of alcohol    Types: 7 Glasses of wine per week    Comment: 1-2 glasses of wine per night   Drug use: No   Sexual activity: Yes  Other Topics Concern   Not on file  Social History Narrative   Married.   1 child, 1 grandchild.   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   Enjoys going to [employer], singing.    Social Determinants of Health   Financial Resource Strain: High Risk (07/02/2022)   Overall  Financial Resource Strain (CARDIA)    Difficulty of Paying Living Expenses: Hard  Food Insecurity: Food Insecurity Present (07/02/2022)   Hunger Vital Sign    Worried About Running Out of Food in the Last Year: Sometimes true    Ran Out of Food in the Last Year: Sometimes true  Transportation Needs: No Transportation Needs (07/02/2022)   PRAPARE - Administrator, Civil Service (Medical): No    Lack of Transportation (Non-Medical): No  Physical Activity: Insufficiently Active (07/02/2022)   Exercise Vital Sign    Days of Exercise per Week: 1 day    Minutes of Exercise per Session: 10 min  Stress: No Stress Concern Present (07/02/2022)   [employer] of Occupational Health - Occupational Stress Questionnaire    Feeling of Stress : Not at all  Social Connections: Moderately Integrated (07/02/2022)   Social  Connection and Isolation Panel [NHANES]    Frequency of Communication with Friends and Family: Three times a week    Frequency of Social Gatherings with Friends and Family: Once a week    Attends Religious Services: More than 4 times per year    Active Member of Clubs or Organizations: Yes    Attends Banker Meetings: More than 4 times per year    Marital Status: Separated  Intimate Partner Violence: Not At Risk (12/31/2021)   Humiliation, Afraid, Rape, and Kick questionnaire    Fear of Current or Ex-Partner: No    Emotionally Abused: No    Physically Abused: No    Sexually Abused: No    Past Surgical History:  Procedure Laterality Date   COLONOSCOPY  2010   2020   HYSTEROSCOPY WITH D & C N/A 07/27/2019   Procedure: DILATATION AND CURETTAGE /HYSTEROSCOPY, Polypectomy;  Surgeon: Tereso Newcomer, MD;  Location: [facility];  Service: Gynecology;  Laterality: N/A;   HYSTEROSCOPY WITH D & C N/A 06/26/2021   Procedure: DILATATION AND CURETTAGE /HYSTEROSCOPY;  Surgeon: Tereso Newcomer, MD;  Location: [facility];  Service: Gynecology;  Laterality: N/A;   INTRAUTERINE DEVICE (IUD) INSERTION N/A 06/26/2021   Procedure: INTRAUTERINE DEVICE (IUD) INSERTION;  Surgeon: Tereso Newcomer, MD;  Location: [facility];  Service: Gynecology;  Laterality: N/A;    Family History  Problem Relation Age of Onset   Depression Mother    Stroke Mother    Sudden death Mother    Heart disease Mother    Hypertension Mother    Heart attack Mother 32   Cancer Father    Prostate cancer Father    Heart disease Father 46       CAD, STENT   Coronary artery disease Sister        stent x 2   Coronary artery disease Sister 28       stent placed   Colon cancer Maternal Aunt    Heart disease Brother        Myocardial infarction   Sudden death Other    Depression Other    Colon polyps Neg Hx    Esophageal cancer Neg Hx    Rectal cancer Neg Hx     Stomach cancer Neg Hx    Breast cancer Neg Hx     No Known Allergies  Current Outpatient Medications on File Prior to Visit  Medication Sig Dispense Refill   albuterol (VENTOLIN HFA) 108 (90 Base) MCG/ACT inhaler Inhale 2 puffs into the lungs every 6 (six) hours as needed for wheezing  or shortness of breath. 8 g 2   amLODipine (NORVASC) 10 MG tablet Take 1 tablet by mouth once daily for blood pressure 90 tablet 2   aspirin EC 81 MG tablet Take 81 mg by mouth daily. Swallow whole.     Cholecalciferol (VITAMIN D3) 1000 UNITS tablet Take 1,000 Units by mouth daily.     docusate sodium (COLACE) 100 MG capsule Take 1 capsule (100 mg total) by mouth 2 (two) times daily as needed for mild constipation or moderate constipation. 30 capsule 2   fluticasone (FLONASE) 50 MCG/ACT nasal spray Place 1 spray into both nostrils 2 (two) times daily as needed for allergies or rhinitis. 16 g 0   loratadine (CLARITIN) 10 MG tablet Take 10 mg by mouth daily.     losartan-hydrochlorothiazide (HYZAAR) 100-25 MG tablet Take 1 tablet by mouth daily. for blood pressure. 90 tablet 3   Melatonin 10 MG TABS Take by mouth as needed.     Potassium Bicarbonate 99 MG CAPS Take 1 capsule (99 mg total) by mouth in the morning and at bedtime. 180 capsule 1   rosuvastatin (CRESTOR) 20 MG tablet Take 1 tablet (20 mg total) by mouth daily. 90 tablet 2   senna (SENNA-TIME) 8.6 MG tablet Take 1 tablet by mouth 2 (two) times daily.     Turmeric 400 MG CAPS Take 400 mg by mouth daily.     No current facility-administered medications on file prior to visit.    BP 134/80   Pulse 80   Temp (!) 97.3 F (36.3 C) (Temporal)   Ht 5' 7.5" (1.715 m)   Wt 258 lb (117 kg)   SpO2 98%   BMI 39.81 kg/m  Objective:   Physical Exam HENT:     Mouth/Throat:     Mouth: Mucous membranes are moist.     Pharynx: Oropharynx is clear. No oropharyngeal exudate or posterior oropharyngeal erythema.     Tonsils: No tonsillar exudate.   Cardiovascular:     Rate and Rhythm: Normal rate.  Pulmonary:     Effort: Pulmonary effort is normal.     Breath sounds: Normal breath sounds.  Neurological:     Mental Status: She is alert.           Assessment & Plan:  Voice hoarseness Assessment & Plan: Improving.  Increase omeprazole to 20 mg twice daily. Encouraged voice rest. Continue Zyrtec 10 mg at bedtime  Consider ENT referral if no improvement, especially given prior smoking history. No alarm signs on exam.   Orders: -     Omeprazole; Take 1 capsule (20 mg total) by mouth 2 (two) times daily before a meal. For heartburn  Dispense: 180 capsule; Refill: 0  Gastroesophageal reflux disease without esophagitis Assessment & Plan: Increasing omeprazole to 20 mg twice daily for 4 voice hoarseness.  Will resume back down to 20 mg daily in the near future.  Orders: -     Omeprazole; Take 1 capsule (20 mg total) by mouth 2 (two) times daily before a meal. For heartburn  Dispense: 180 capsule; Refill: 0        Doreene Nest, NP

## 2022-08-26 NOTE — Patient Instructions (Signed)
We increased the dose of your omeprazole to 20 mg twice daily.  Continue Zyrtec.  Please update me in a few weeks via MyChart.  It was a pleasure to see you today!

## 2022-08-26 NOTE — Assessment & Plan Note (Signed)
Increasing omeprazole to 20 mg twice daily for 4 voice hoarseness.  Will resume back down to 20 mg daily in the near future.

## 2022-09-09 ENCOUNTER — Ambulatory Visit: Payer: Medicare Other | Admitting: Rheumatology

## 2022-10-06 ENCOUNTER — Other Ambulatory Visit: Payer: Self-pay | Admitting: Primary Care

## 2022-10-06 DIAGNOSIS — I10 Essential (primary) hypertension: Secondary | ICD-10-CM

## 2022-10-07 DIAGNOSIS — R490 Dysphonia: Secondary | ICD-10-CM

## 2022-10-07 DIAGNOSIS — K219 Gastro-esophageal reflux disease without esophagitis: Secondary | ICD-10-CM

## 2022-10-09 MED ORDER — OMEPRAZOLE 20 MG PO CPDR
20.0000 mg | DELAYED_RELEASE_CAPSULE | Freq: Every day | ORAL | 0 refills | Status: DC
Start: 2022-10-09 — End: 2023-03-23

## 2022-10-17 ENCOUNTER — Encounter: Payer: Self-pay | Admitting: Nurse Practitioner

## 2022-10-17 ENCOUNTER — Telehealth: Payer: Medicare Other | Admitting: Nurse Practitioner

## 2022-10-17 VITALS — BP 132/62 | HR 64 | Temp 101.0°F | Ht 67.5 in | Wt 252.0 lb

## 2022-10-17 DIAGNOSIS — U071 COVID-19: Secondary | ICD-10-CM | POA: Diagnosis not present

## 2022-10-17 MED ORDER — NIRMATRELVIR/RITONAVIR (PAXLOVID)TABLET: 3.0000 | ORAL_TABLET | Freq: Two times a day (BID) | ORAL | 0 refills | Status: AC

## 2022-10-17 MED ORDER — BENZONATATE 100 MG PO CAPS: 100.0000 mg | ORAL_CAPSULE | Freq: Three times a day (TID) | ORAL | 0 refills | Status: DC | PRN

## 2022-10-17 NOTE — Progress Notes (Signed)
Virtual Visit via Video Note  I connected with Paige Hester on 10/17/22 at 4:05 PM by a video enabled telemedicine application and verified that I am speaking with the correct person using two identifiers.  Patient Location: Home Provider Location: Office/Clinic  I discussed the limitations, risks, security, and privacy concerns of performing an evaluation and management service by video and the availability of in person appointments. I also discussed with the patient that there may be a patient responsible charge related to this service. The patient expressed understanding and agreed to proceed.  Subjective: PCP: Doreene Nest, NP  Chief Complaint  Patient presents with   Covid Positive    Yesterday evening at 6 pm    Fever   Headache   HPI  Patient is seen due to positive COVID home test yesterday. Symptoms started yesterday evening. The associated symptoms include: Fever,Cough, SR, congestion and HA Denise runny nose, sob, chest pain.  Medication used : tylenol  She would like to get started on antiviral medication.   ROS: Per HPI  Current Outpatient Medications:    albuterol (VENTOLIN HFA) 108 (90 Base) MCG/ACT inhaler, Inhale 2 puffs into the lungs every 6 (six) hours as needed for wheezing or shortness of breath., Disp: 8 g, Rfl: 2   amLODipine (NORVASC) 10 MG tablet, Take 1 tablet by mouth once daily for blood pressure, Disp: 90 tablet, Rfl: 0   aspirin EC 81 MG tablet, Take 81 mg by mouth daily. Swallow whole., Disp: , Rfl:    benzonatate (TESSALON PERLES) 100 MG capsule, Take 1 capsule (100 mg total) by mouth 3 (three) times daily as needed., Disp: 30 capsule, Rfl: 0   Cholecalciferol (VITAMIN D3) 1000 UNITS tablet, Take 1,000 Units by mouth daily., Disp: , Rfl:    docusate sodium (COLACE) 100 MG capsule, Take 1 capsule (100 mg total) by mouth 2 (two) times daily as needed for mild constipation or moderate constipation., Disp: 30 capsule, Rfl: 2    fluticasone (FLONASE) 50 MCG/ACT nasal spray, Place 1 spray into both nostrils 2 (two) times daily as needed for allergies or rhinitis., Disp: 16 g, Rfl: 0   loratadine (CLARITIN) 10 MG tablet, Take 10 mg by mouth daily., Disp: , Rfl:    losartan-hydrochlorothiazide (HYZAAR) 100-25 MG tablet, Take 1 tablet by mouth daily. for blood pressure., Disp: 90 tablet, Rfl: 3   Melatonin 10 MG TABS, Take by mouth as needed., Disp: , Rfl:    nirmatrelvir/ritonavir (PAXLOVID) 20 x 150 MG & 10 x 100MG  TABS, Take 3 tablets by mouth 2 (two) times daily for 5 days. (Take nirmatrelvir 150 mg two tablets twice daily for 5 days and ritonavir 100 mg one tablet twice daily for 5 days) Patient GFR is 72.12 on 01/17/22, Disp: 30 tablet, Rfl: 0   omeprazole (PRILOSEC) 20 MG capsule, Take 1 capsule (20 mg total) by mouth daily. For heartburn, Disp: 90 capsule, Rfl: 0   Potassium Bicarbonate 99 MG CAPS, Take 1 capsule (99 mg total) by mouth in the morning and at bedtime., Disp: 180 capsule, Rfl: 1   rosuvastatin (CRESTOR) 20 MG tablet, Take 1 tablet (20 mg total) by mouth daily., Disp: 90 tablet, Rfl: 2   senna (SENNA-TIME) 8.6 MG tablet, Take 1 tablet by mouth 2 (two) times daily., Disp: , Rfl:    Turmeric 400 MG CAPS, Take 400 mg by mouth daily., Disp: , Rfl:   Observations/Objective: Today's Vitals   10/17/22 1557  BP: 132/62  Pulse: 64  Temp: (!) 101 F (38.3 C)  Weight: 252 lb (114.3 kg)  Height: 5' 7.5" (1.715 m)   Physical Exam Constitutional:      General: She is not in acute distress.    Appearance: Normal appearance.  Eyes:     Conjunctiva/sclera: Conjunctivae normal.  Pulmonary:     Effort: No respiratory distress.  Neurological:     Mental Status: She is alert.  Psychiatric:        Mood and Affect: Mood normal.        Behavior: Behavior normal.        Thought Content: Thought content normal.        Judgment: Judgment normal.     Assessment and Plan: COVID-19 Assessment & Plan: Will treat  with Paxlovid and Tessalon Perles Advised to use Flonase nasal spray and OTC plain Mucinex for congestion. Advised to use salt water gargles and honey with tea, warm water. Continue Tylenol for pain and fever control. Increase fluid intake and rest. Side effect of antiviral discussed.     Other orders -     Benzonatate; Take 1 capsule (100 mg total) by mouth 3 (three) times daily as needed.  Dispense: 30 capsule; Refill: 0 -     nirmatrelvir/ritonavir; Take 3 tablets by mouth 2 (two) times daily for 5 days. (Take nirmatrelvir 150 mg two tablets twice daily for 5 days and ritonavir 100 mg one tablet twice daily for 5 days) Patient GFR is 72.12 on 01/17/22  Dispense: 30 tablet; Refill: 0    Follow Up Instructions: No follow-ups on file.   I discussed the assessment and treatment plan with the patient. The patient was provided an opportunity to ask questions, and all were answered. The patient agreed with the plan and demonstrated an understanding of the instructions.   The patient was advised to call back or seek an in-person evaluation if the symptoms worsen or if the condition fails to improve as anticipated.  The above assessment and management plan was discussed with the patient. The patient verbalized understanding of and has agreed to the management plan.   Kara Dies, NP

## 2022-10-17 NOTE — Assessment & Plan Note (Addendum)
Will treat with Paxlovid and Tessalon Perles Advised to use Flonase nasal spray and OTC plain Mucinex for congestion. Advised to use salt water gargles and honey with tea, warm water. Continue Tylenol for pain and fever control. Increase fluid intake and rest. Side effect of antiviral discussed.

## 2022-10-30 ENCOUNTER — Encounter (INDEPENDENT_AMBULATORY_CARE_PROVIDER_SITE_OTHER): Payer: Self-pay

## 2022-11-12 ENCOUNTER — Encounter: Payer: Medicare Other | Admitting: Primary Care

## 2022-11-18 ENCOUNTER — Encounter: Payer: Self-pay | Admitting: Primary Care

## 2022-11-18 ENCOUNTER — Ambulatory Visit (INDEPENDENT_AMBULATORY_CARE_PROVIDER_SITE_OTHER): Payer: Medicare Other | Admitting: Primary Care

## 2022-11-18 VITALS — BP 108/54 | HR 73 | Temp 97.3°F | Ht 67.5 in | Wt 247.0 lb

## 2022-11-18 DIAGNOSIS — I10 Essential (primary) hypertension: Secondary | ICD-10-CM

## 2022-11-18 DIAGNOSIS — G4733 Obstructive sleep apnea (adult) (pediatric): Secondary | ICD-10-CM

## 2022-11-18 DIAGNOSIS — Z0000 Encounter for general adult medical examination without abnormal findings: Secondary | ICD-10-CM

## 2022-11-18 DIAGNOSIS — Z1231 Encounter for screening mammogram for malignant neoplasm of breast: Secondary | ICD-10-CM | POA: Diagnosis not present

## 2022-11-18 DIAGNOSIS — I6523 Occlusion and stenosis of bilateral carotid arteries: Secondary | ICD-10-CM

## 2022-11-18 DIAGNOSIS — E782 Mixed hyperlipidemia: Secondary | ICD-10-CM | POA: Diagnosis not present

## 2022-11-18 DIAGNOSIS — R7303 Prediabetes: Secondary | ICD-10-CM

## 2022-11-18 DIAGNOSIS — R490 Dysphonia: Secondary | ICD-10-CM

## 2022-11-18 DIAGNOSIS — E2839 Other primary ovarian failure: Secondary | ICD-10-CM

## 2022-11-18 DIAGNOSIS — I700 Atherosclerosis of aorta: Secondary | ICD-10-CM

## 2022-11-18 DIAGNOSIS — K219 Gastro-esophageal reflux disease without esophagitis: Secondary | ICD-10-CM

## 2022-11-18 NOTE — Assessment & Plan Note (Signed)
Controlled.  Continue omeprazole 20 mg PRN 

## 2022-11-18 NOTE — Assessment & Plan Note (Signed)
Repeat A1C pending.  Discussed the importance of a healthy diet and regular exercise in order for weight loss, and to reduce the risk of further co-morbidity.  

## 2022-11-18 NOTE — Assessment & Plan Note (Signed)
Immunizations UTD. Mammogram UTD. Bone density scan due later this year. Colonoscopy UTD, due 2027  Discussed the importance of a healthy diet and regular exercise in order for weight loss, and to reduce the risk of further co-morbidity.  Exam stable. Labs pending.  Follow up in 1 year for repeat physical.

## 2022-11-18 NOTE — Assessment & Plan Note (Signed)
Continue CPAP nightly. Following with pulmonology.

## 2022-11-18 NOTE — Assessment & Plan Note (Signed)
Significant improvement. Continue omeprazole 20 mg every other day

## 2022-11-18 NOTE — Assessment & Plan Note (Signed)
Repeat lipid panel pending.  Discussed the importance of a healthy diet and regular exercise in order for weight loss, and to reduce the risk of further co-morbidity. Continue rosuvastatin 20 mg daily.  

## 2022-11-18 NOTE — Progress Notes (Signed)
Subjective:    Patient ID: Paige Hester, female    DOB: 02-Jul-1955, 67 y.o.   MRN: [id number]  HPI  Paige Hester is a very pleasant 67 y.o. female who presents today for complete physical and follow up of chronic conditions.  Immunizations: -Tetanus: Completed in 2014 -Influenza: Declines  -Shingles: Completed Shingrix series -Pneumonia: Completed Prevnar 20 2022, Pneumovax 23 in 2023  Diet: Fair diet.  Exercise: No regular exercise.  Eye exam: Completes annually  Dental exam: Completes semi-annually    Mammogram: Completed in December 2023 Bone Density Scan: Completed in November 2022  Colonoscopy: Completed in 2020, due 2027  BP Readings from Last 3 Encounters:  11/18/22 (!) 108/54  10/17/22 132/62  08/26/22 134/80         Review of Systems  Constitutional:  Negative for unexpected weight change.  HENT:  Negative for rhinorrhea.   Respiratory:  Negative for cough and shortness of breath.   Cardiovascular:  Negative for chest pain.  Gastrointestinal:  Negative for constipation and diarrhea.  Genitourinary:  Negative for difficulty urinating.  Musculoskeletal:  Negative for arthralgias and myalgias.  Skin:  Negative for rash.  Allergic/Immunologic: Negative for environmental allergies.  Neurological:  Negative for dizziness, numbness and headaches.  Psychiatric/Behavioral:  The patient is not nervous/anxious.          Past Medical History:  Diagnosis Date   Allergy    Bilateral swelling of feet and ankles    Gallbladder polyp 2012   GERD (gastroesophageal reflux disease)    Hyperlipidemia    Hypertension    Iron deficiency anemia    Menopausal symptoms    since 2007   Murmur, cardiac    Other fatigue    Prediabetes    Pulmonary embolism (HCC) 06/2020   Shortness of breath    with exertion; patient going to pulmonary rehabilitation   Shortness of breath on exertion    Sleep apnea    Uses CPAP   Vitamin D deficiency     Social  History   Socioeconomic History   Marital status: Legally Separated    Spouse name: Not on file   Number of children: 1   Years of education: Not on file   Highest education level: Bachelor's degree (e.g., BA, AB, BS)  Occupational History   Occupation: Retired  Tobacco Use   Smoking status: Former    Current packs/day: 0.00    Average packs/day: 1 pack/day for 27.0 years (27.0 ttl pk-yrs)    Types: Cigarettes    Start date: 70    Quit date: 1999    Years since quitting: 25.6   Smokeless tobacco: Never  Vaping Use   Vaping status: Never Used  Substance and Sexual Activity   Alcohol use: Yes    Alcohol/week: 7.0 standard drinks of alcohol    Types: 7 Glasses of wine per week    Comment: 1-2 glasses of wine per night   Drug use: No   Sexual activity: Yes  Other Topics Concern   Not on file  Social History Narrative   Married.   1 child, 1 grandchild.   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   Enjoys going to [employer], singing.    Social Determinants of Health   Financial Resource Strain: High Risk (07/02/2022)   Overall Financial Resource Strain (CARDIA)    Difficulty of Paying Living Expenses: Hard  Food Insecurity: Food Insecurity Present (07/02/2022)   Hunger Vital Sign  Worried About Programme researcher, broadcasting/film/video in the Last Year: Sometimes true    [employer] of Food in the Last Year: Sometimes true  Transportation Needs: No Transportation Needs (07/02/2022)   PRAPARE - Administrator, Civil Service (Medical): No    Lack of Transportation (Non-Medical): No  Physical Activity: Insufficiently Active (07/02/2022)   Exercise Vital Sign    Days of Exercise per Week: 1 day    Minutes of Exercise per Session: 10 min  Stress: No Stress Concern Present (07/02/2022)   [employer] of Occupational Health - Occupational Stress Questionnaire    Feeling of Stress : Not at all  Social Connections: Moderately Integrated (07/02/2022)    Social Connection and Isolation Panel [NHANES]    Frequency of Communication with Friends and Family: Three times a week    Frequency of Social Gatherings with Friends and Family: Once a week    Attends Religious Services: More than 4 times per year    Active Member of Clubs or Organizations: Yes    Attends Banker Meetings: More than 4 times per year    Marital Status: Separated  Intimate Partner Violence: Not At Risk (12/31/2021)   Humiliation, Afraid, Rape, and Kick questionnaire    Fear of Current or Ex-Partner: No    Emotionally Abused: No    Physically Abused: No    Sexually Abused: No    Past Surgical History:  Procedure Laterality Date   COLONOSCOPY  2010   2020   HYSTEROSCOPY WITH D & C N/A 07/27/2019   Procedure: DILATATION AND CURETTAGE /HYSTEROSCOPY, Polypectomy;  Surgeon: Tereso Newcomer, MD;  Location: [facility];  Service: Gynecology;  Laterality: N/A;   HYSTEROSCOPY WITH D & C N/A 06/26/2021   Procedure: DILATATION AND CURETTAGE /HYSTEROSCOPY;  Surgeon: Tereso Newcomer, MD;  Location: [facility];  Service: Gynecology;  Laterality: N/A;   INTRAUTERINE DEVICE (IUD) INSERTION N/A 06/26/2021   Procedure: INTRAUTERINE DEVICE (IUD) INSERTION;  Surgeon: Tereso Newcomer, MD;  Location:  SUR[facility]ERY [facility];  Service: Gynecology;  Laterality: N/A;    Family History  Problem Relation Age of Onset   Depression Mother    Stroke Mother    Sudden death Mother    Heart disease Mother    Hypertension Mother    Heart attack Mother 37   Cancer Father    Prostate cancer Father    Heart disease Father 49       CAD, STENT   Coronary artery disease Sister        stent x 2   Coronary artery disease Sister 32       stent placed   Hypertension Sister    Hypertension Sister    Heart disease Brother        Myocardial infarction   Colon cancer Maternal Aunt    Sudden death Other    Depression Other    Colon polyps Neg Hx     Esophageal cancer Neg Hx    Rectal cancer Neg Hx    Stomach cancer Neg Hx    Breast cancer Neg Hx     No Known Allergies  Current Outpatient Medications on File Prior to Visit  Medication Sig Dispense Refill   albuterol (VENTOLIN HFA) 108 (90 Base) MCG/ACT inhaler Inhale 2 puffs into the lungs every 6 (six) hours as needed for wheezing or shortness of breath. 8 g 2   amLODipine (NORVASC) 10 MG tablet Take 1 tablet by  mouth once daily for blood pressure 90 tablet 0   aspirin EC 81 MG tablet Take 81 mg by mouth daily. Swallow whole.     Cholecalciferol (VITAMIN D3) 1000 UNITS tablet Take 1,000 Units by mouth daily.     fluticasone (FLONASE) 50 MCG/ACT nasal spray Place 1 spray into both nostrils 2 (two) times daily as needed for allergies or rhinitis. 16 g 0   loratadine (CLARITIN) 10 MG tablet Take 10 mg by mouth daily.     losartan-hydrochlorothiazide (HYZAAR) 100-25 MG tablet Take 1 tablet by mouth daily. for blood pressure. 90 tablet 3   Melatonin 10 MG TABS Take by mouth as needed.     omeprazole (PRILOSEC) 20 MG capsule Take 1 capsule (20 mg total) by mouth daily. For heartburn 90 capsule 0   Potassium Bicarbonate 99 MG CAPS Take 1 capsule (99 mg total) by mouth in the morning and at bedtime. 180 capsule 1   rosuvastatin (CRESTOR) 20 MG tablet Take 1 tablet (20 mg total) by mouth daily. 90 tablet 2   senna (SENNA-TIME) 8.6 MG tablet Take 1 tablet by mouth 2 (two) times daily.     Turmeric 400 MG CAPS Take 400 mg by mouth daily.     benzonatate (TESSALON PERLES) 100 MG capsule Take 1 capsule (100 mg total) by mouth 3 (three) times daily as needed. (Patient not taking: Reported on 11/18/2022) 30 capsule 0   docusate sodium (COLACE) 100 MG capsule Take 1 capsule (100 mg total) by mouth 2 (two) times daily as needed for mild constipation or moderate constipation. (Patient not taking: Reported on 11/18/2022) 30 capsule 2   No current facility-administered medications on file prior to visit.     BP (!) 108/54   Pulse 73   Temp (!) 97.3 F (36.3 C) (Temporal)   Ht 5' 7.5" (1.715 m)   Wt 247 lb (112 kg)   SpO2 99%   BMI 38.11 kg/m  Objective:   Physical Exam HENT:     Right Ear: Tympanic membrane and ear canal normal.     Left Ear: Tympanic membrane and ear canal normal.     Nose: Nose normal.  Eyes:     Conjunctiva/sclera: Conjunctivae normal.     Pupils: Pupils are equal, round, and reactive to light.  Neck:     Thyroid: No thyromegaly.  Cardiovascular:     Rate and Rhythm: Normal rate and regular rhythm.     Heart sounds: No murmur heard. Pulmonary:     Effort: Pulmonary effort is normal.     Breath sounds: Normal breath sounds. No rales.  Abdominal:     General: Bowel sounds are normal.     Palpations: Abdomen is soft.     Tenderness: There is no abdominal tenderness.  Musculoskeletal:        General: Normal range of motion.     Cervical back: Neck supple.  Lymphadenopathy:     Cervical: No cervical adenopathy.  Skin:    General: Skin is warm and dry.     Findings: No rash.  Neurological:     Mental Status: She is alert and oriented to person, place, and time.     Cranial Nerves: No cranial nerve deficit.     Deep Tendon Reflexes: Reflexes are normal and symmetric.  Psychiatric:        Mood and Affect: Mood normal.           Assessment & Plan:  Preventative health care Assessment & Plan:  Immunizations UTD. Mammogram UTD. Bone density scan due later this year. Colonoscopy UTD, due 2027  Discussed the importance of a healthy diet and regular exercise in order for weight loss, and to reduce the risk of further co-morbidity.  Exam stable. Labs pending.  Follow up in 1 year for repeat physical.    Screening mammogram for breast cancer -     3D Screening Mammogram, Left and Right; Future  Estrogen deficiency -     DG Bone Density; Future  Aortic atherosclerosis (HCC) Assessment & Plan: Repeat lipid panel pending.  Discussed the  importance of a healthy diet and regular exercise in order for weight loss, and to reduce the risk of further co-morbidity. Continue rosuvastatin 20 mg daily   Essential hypertension Assessment & Plan: Controlled, low end of normal an asymptomatic. Home readings are higher.  Continue amlodipine 10 mg daily for now. Consider reducing to 5 mg daily.  She will monitor BP at home. Continue losartan-hydrochlorothiazide 100-25 mg daily.  CMP pending.   OSA (obstructive sleep apnea) Assessment & Plan: Continue CPAP nightly. Following with pulmonology    Gastroesophageal reflux disease without esophagitis Assessment & Plan: Controlled.  Continue omeprazole 20 mg PRN   Mixed hyperlipidemia Assessment & Plan: Repeat lipid panel pending.  Discussed the importance of a healthy diet and regular exercise in order for weight loss, and to reduce the risk of further co-morbidity. Continue rosuvastatin 20 mg daily.  Orders: -     Lipid panel -     Comprehensive metabolic panel  Prediabetes Assessment & Plan: Repeat A1C pending.  Discussed the importance of a healthy diet and regular exercise in order for weight loss, and to reduce the risk of further co-morbidity.   Orders: -     Hemoglobin A1c  Voice hoarseness Assessment & Plan: Significant improvement. Continue omeprazole 20 mg every other day   Bilateral carotid artery stenosis Assessment & Plan: Reviewed [place] from 2022. Repeat carotid US ordered and pending.  Orders: -     US Carotid Bilateral; Future        Doreene Nest, NP

## 2022-11-18 NOTE — Assessment & Plan Note (Addendum)
Controlled, low end of normal an asymptomatic. Home readings are higher.  Continue amlodipine 10 mg daily for now. Consider reducing to 5 mg daily.  She will monitor BP at home. Continue losartan-hydrochlorothiazide 100-25 mg daily.  CMP pending.

## 2022-11-18 NOTE — Patient Instructions (Addendum)
Stop by the lab prior to leaving today. I will notify you of your results once received.   Call [facility] to schedule your mammogram and bone density scan for December 2024.  Continue to monitor your blood pressure at home, notify me if readings remain in the low 100's/50's.   You will either be contacted via phone regarding your carotid ultrasound, or you may receive a letter on your MyChart portal from our referral team with instructions for scheduling an appointment. Please let us know if you have not been contacted by anyone within two weeks.  It was a pleasure to see you today!

## 2022-11-18 NOTE — Assessment & Plan Note (Signed)
Reviewed [place] from 2022. Repeat carotid US ordered and pending.

## 2022-11-19 LAB — COMPREHENSIVE METABOLIC PANEL
ALT: 10 U/L (ref 0–35)
AST: 18 U/L (ref 0–37)
Albumin: 3.9 g/dL (ref 3.5–5.2)
Alkaline Phosphatase: 74 U/L (ref 39–117)
BUN: 12 mg/dL (ref 6–23)
CO2: 32 meq/L (ref 19–32)
Calcium: 10.4 mg/dL (ref 8.4–10.5)
Chloride: 100 meq/L (ref 96–112)
Creatinine, Ser: 0.86 mg/dL (ref 0.40–1.20)
GFR: 69.7 mL/min (ref >60.00)
Glucose, Bld: 88 mg/dL (ref 70–99)
Potassium: 3.7 meq/L (ref 3.5–5.1)
Sodium: 141 meq/L (ref 135–145)
Total Bilirubin: 0.5 mg/dL (ref 0.2–1.2)
Total Protein: 7.2 g/dL (ref 6.0–8.3)

## 2022-11-19 LAB — LIPID PANEL
Cholesterol: 118 mg/dL (ref 0–200)
HDL: 43.8 mg/dL (ref >39.00)
LDL Cholesterol: 61 mg/dL (ref 0–99)
NonHDL: 73.98
Total CHOL/HDL Ratio: 3
Triglycerides: 66 mg/dL (ref 0.0–149.0)
VLDL: 13.2 mg/dL (ref 0.0–40.0)

## 2022-11-19 LAB — HEMOGLOBIN A1C: Hgb A1c MFr Bld: 6.4 % (ref 4.6–6.5)

## 2022-12-29 ENCOUNTER — Other Ambulatory Visit: Payer: Self-pay | Admitting: Primary Care

## 2022-12-29 DIAGNOSIS — I10 Essential (primary) hypertension: Secondary | ICD-10-CM

## 2023-01-02 ENCOUNTER — Other Ambulatory Visit: Payer: Self-pay | Admitting: Primary Care

## 2023-01-02 DIAGNOSIS — I10 Essential (primary) hypertension: Secondary | ICD-10-CM

## 2023-01-16 ENCOUNTER — Other Ambulatory Visit: Payer: Self-pay | Admitting: Primary Care

## 2023-01-16 DIAGNOSIS — R7303 Prediabetes: Secondary | ICD-10-CM

## 2023-01-19 DIAGNOSIS — E782 Mixed hyperlipidemia: Secondary | ICD-10-CM

## 2023-01-19 DIAGNOSIS — K219 Gastro-esophageal reflux disease without esophagitis: Secondary | ICD-10-CM

## 2023-01-19 DIAGNOSIS — I10 Essential (primary) hypertension: Secondary | ICD-10-CM

## 2023-01-19 MED ORDER — ROSUVASTATIN CALCIUM 20 MG PO TABS
20.0000 mg | ORAL_TABLET | Freq: Every day | ORAL | 0 refills | Status: DC
Start: 2023-01-19 — End: 2023-01-22

## 2023-01-19 MED ORDER — LOSARTAN POTASSIUM-HCTZ 100-25 MG PO TABS
1.0000 | ORAL_TABLET | Freq: Every day | ORAL | 0 refills | Status: DC
Start: 2023-01-19 — End: 2023-01-22

## 2023-01-19 MED ORDER — AMLODIPINE BESYLATE 10 MG PO TABS
10.0000 mg | ORAL_TABLET | Freq: Every day | ORAL | 0 refills | Status: DC
Start: 2023-01-19 — End: 2023-01-22

## 2023-01-19 MED ORDER — OMEPRAZOLE 20 MG PO CPDR
20.0000 mg | DELAYED_RELEASE_CAPSULE | Freq: Every day | ORAL | 0 refills | Status: DC
Start: 2023-01-19 — End: 2023-01-22

## 2023-01-22 DIAGNOSIS — K219 Gastro-esophageal reflux disease without esophagitis: Secondary | ICD-10-CM

## 2023-01-22 DIAGNOSIS — I10 Essential (primary) hypertension: Secondary | ICD-10-CM

## 2023-01-22 DIAGNOSIS — E782 Mixed hyperlipidemia: Secondary | ICD-10-CM

## 2023-01-22 MED ORDER — LOSARTAN POTASSIUM-HCTZ 100-25 MG PO TABS
1.0000 | ORAL_TABLET | Freq: Every day | ORAL | 0 refills | Status: DC
Start: 2023-01-22 — End: 2023-01-23

## 2023-01-22 MED ORDER — AMLODIPINE BESYLATE 10 MG PO TABS: 10.0000 mg | ORAL_TABLET | Freq: Every day | ORAL | 0 refills | Status: DC

## 2023-01-22 MED ORDER — ROSUVASTATIN CALCIUM 20 MG PO TABS: 20.0000 mg | ORAL_TABLET | Freq: Every day | ORAL | 0 refills | Status: DC

## 2023-01-22 MED ORDER — OMEPRAZOLE 20 MG PO CPDR: 20.0000 mg | DELAYED_RELEASE_CAPSULE | Freq: Every day | ORAL | 0 refills | Status: DC

## 2023-01-23 DIAGNOSIS — K219 Gastro-esophageal reflux disease without esophagitis: Secondary | ICD-10-CM

## 2023-01-23 DIAGNOSIS — E782 Mixed hyperlipidemia: Secondary | ICD-10-CM

## 2023-01-23 DIAGNOSIS — I10 Essential (primary) hypertension: Secondary | ICD-10-CM

## 2023-01-23 MED ORDER — ROSUVASTATIN CALCIUM 20 MG PO TABS
20.0000 mg | ORAL_TABLET | Freq: Every day | ORAL | 0 refills | Status: DC
Start: 2023-01-23 — End: 2023-06-30

## 2023-01-23 MED ORDER — OMEPRAZOLE 20 MG PO CPDR: 20.0000 mg | DELAYED_RELEASE_CAPSULE | Freq: Every day | ORAL | 0 refills | Status: DC

## 2023-01-23 MED ORDER — AMLODIPINE BESYLATE 10 MG PO TABS: 10.0000 mg | ORAL_TABLET | Freq: Every day | ORAL | 0 refills | Status: DC

## 2023-01-23 MED ORDER — LOSARTAN POTASSIUM-HCTZ 100-25 MG PO TABS: 1.0000 | ORAL_TABLET | Freq: Every day | ORAL | 0 refills | Status: DC

## 2023-01-27 ENCOUNTER — Other Ambulatory Visit: Payer: Medicare Other

## 2023-01-27 ENCOUNTER — Ambulatory Visit: Payer: Medicare Other | Admitting: Primary Care

## 2023-02-09 ENCOUNTER — Other Ambulatory Visit (INDEPENDENT_AMBULATORY_CARE_PROVIDER_SITE_OTHER): Payer: Medicare Other

## 2023-02-09 DIAGNOSIS — R7303 Prediabetes: Secondary | ICD-10-CM

## 2023-02-09 LAB — POCT GLYCOSYLATED HEMOGLOBIN (HGB A1C): Hemoglobin A1C: 6 % — AB (ref 4.0–5.6)

## 2023-02-11 ENCOUNTER — Encounter: Payer: Medicare Other | Admitting: Primary Care

## 2023-02-11 NOTE — Progress Notes (Signed)
Error. Patient not seen.

## 2023-02-11 NOTE — Progress Notes (Deleted)
Subjective:    Patient ID: Paige Hester, female    DOB: 13-May-1955, 67 y.o.   MRN: [id number]  HPI  Paige Hester is a very pleasant 67 y.o. female who presents today for complete physical and follow up of chronic conditions.  Immunizations: -Tetanus: Completed in 2014 -Influenza: Completed this season  -Shingles: Completed Shingrix series -Pneumonia: Completed Prevnar 20 in 2022, Pneumovax 23 in 2023  Diet: Fair diet.  Exercise: No regular exercise.  Eye exam: Completes annually  Dental exam: Completes semi-annually    Mammogram: Bone Density Scan:  Colonoscopy: Completed in 2020, due       Review of Systems       Past Medical History:  Diagnosis Date   Allergy    Bilateral swelling of feet and ankles    Gallbladder polyp 2012   GERD (gastroesophageal reflux disease)    Hyperlipidemia    Hypertension    Iron deficiency anemia    Menopausal symptoms    since 2007   Murmur, cardiac    Other fatigue    Prediabetes    Pulmonary embolism (HCC) 06/2020   Shortness of breath    with exertion; patient going to pulmonary rehabilitation   Shortness of breath on exertion    Sleep apnea    Uses CPAP   Vitamin D deficiency     Social History   Socioeconomic History   Marital status: Legally Separated    Spouse name: Not on file   Number of children: 1   Years of education: Not on file   Highest education level: Bachelor's degree (e.g., BA, AB, BS)  Occupational History   Occupation: Retired  Tobacco Use   Smoking status: Former    Current packs/day: 0.00    Average packs/day: 1 pack/day for 27.0 years (27.0 ttl pk-yrs)    Types: Cigarettes    Start date: 46    Quit date: 1999    Years since quitting: 25.9   Smokeless tobacco: Never  Vaping Use   Vaping status: Never Used  Substance and Sexual Activity   Alcohol use: Yes    Alcohol/week: 7.0 standard drinks of alcohol    Types: 7 Glasses of wine per week    Comment: 1-2 glasses of  wine per night   Drug use: No   Sexual activity: Yes  Other Topics Concern   Not on file  Social History Narrative   Married.   1 child, 1 grandchild.   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   Enjoys going to [employer], singing.    Social Determinants of Health   Financial Resource Strain: High Risk (07/02/2022)   Overall Financial Resource Strain (CARDIA)    Difficulty of Paying Living Expenses: Hard  Food Insecurity: Food Insecurity Present (07/02/2022)   Hunger Vital Sign    Worried About Running Out of Food in the Last Year: Sometimes true    Ran Out of Food in the Last Year: Sometimes true  Transportation Needs: No Transportation Needs (07/02/2022)   PRAPARE - Administrator, Civil Service (Medical): No    Lack of Transportation (Non-Medical): No  Physical Activity: Insufficiently Active (07/02/2022)   Exercise Vital Sign    Days of Exercise per Week: 1 day    Minutes of Exercise per Session: 10 min  Stress: No Stress Concern Present (07/02/2022)   [employer] of Occupational Health - Occupational Stress Questionnaire    Feeling of Stress : Not  at all  Social Connections: Moderately Integrated (07/02/2022)   Social Connection and Isolation Panel [NHANES]    Frequency of Communication with Friends and Family: Three times a week    Frequency of Social Gatherings with Friends and Family: Once a week    Attends Religious Services: More than 4 times per year    Active Member of Clubs or Organizations: Yes    Attends Banker Meetings: More than 4 times per year    Marital Status: Separated  Intimate Partner Violence: Not At Risk (12/31/2021)   Humiliation, Afraid, Rape, and Kick questionnaire    Fear of Current or Ex-Partner: No    Emotionally Abused: No    Physically Abused: No    Sexually Abused: No    Past Surgical History:  Procedure Laterality Date   COLONOSCOPY  2010   2020   HYSTEROSCOPY WITH D & C  N/A 07/27/2019   Procedure: DILATATION AND CURETTAGE /HYSTEROSCOPY, Polypectomy;  Surgeon: Tereso Newcomer, MD;  Location: [facility];  Service: Gynecology;  Laterality: N/A;   HYSTEROSCOPY WITH D & C N/A 06/26/2021   Procedure: DILATATION AND CURETTAGE /HYSTEROSCOPY;  Surgeon: Tereso Newcomer, MD;  Location: [facility];  Service: Gynecology;  Laterality: N/A;   INTRAUTERINE DEVICE (IUD) INSERTION N/A 06/26/2021   Procedure: INTRAUTERINE DEVICE (IUD) INSERTION;  Surgeon: Tereso Newcomer, MD;  Location: [facility];  Service: Gynecology;  Laterality: N/A;    Family History  Problem Relation Age of Onset   Depression Mother    Stroke Mother    Sudden death Mother    Heart disease Mother    Hypertension Mother    Heart attack Mother 19   Cancer Father    Prostate cancer Father    Heart disease Father 19       CAD, STENT   Coronary artery disease Sister        stent x 2   Coronary artery disease Sister 18       stent placed   Hypertension Sister    Hypertension Sister    Heart disease Brother        Myocardial infarction   Colon cancer Maternal Aunt    Sudden death Other    Depression Other    Colon polyps Neg Hx    Esophageal cancer Neg Hx    Rectal cancer Neg Hx    Stomach cancer Neg Hx    Breast cancer Neg Hx     No Known Allergies  Current Outpatient Medications on File Prior to Visit  Medication Sig Dispense Refill   albuterol (VENTOLIN HFA) 108 (90 Base) MCG/ACT inhaler Inhale 2 puffs into the lungs every 6 (six) hours as needed for wheezing or shortness of breath. 8 g 2   amLODipine (NORVASC) 10 MG tablet Take 1 tablet by mouth once daily for blood pressure 90 tablet 2   amLODipine (NORVASC) 10 MG tablet Take 1 tablet (10 mg total) by mouth daily. for blood pressure. 14 tablet 0   aspirin EC 81 MG tablet Take 81 mg by mouth daily. Swallow whole.     Cholecalciferol (VITAMIN D3) 1000 UNITS tablet Take 1,000 Units by  mouth daily.     fluticasone (FLONASE) 50 MCG/ACT nasal spray Place 1 spray into both nostrils 2 (two) times daily as needed for allergies or rhinitis. 16 g 0   loratadine (CLARITIN) 10 MG tablet Take 10 mg by mouth daily.     losartan-hydrochlorothiazide (HYZAAR)  100-25 MG tablet Take 1 tablet by mouth once daily for blood pressure 90 tablet 2   losartan-hydrochlorothiazide (HYZAAR) 100-25 MG tablet Take 1 tablet by mouth daily. .for blood pressure. 14 tablet 0   Melatonin 10 MG TABS Take by mouth as needed.     omeprazole (PRILOSEC) 20 MG capsule Take 1 capsule (20 mg total) by mouth daily. For heartburn 90 capsule 0   omeprazole (PRILOSEC) 20 MG capsule Take 1 capsule (20 mg total) by mouth daily. for heartburn. 14 capsule 0   Potassium Bicarbonate 99 MG CAPS Take 1 capsule (99 mg total) by mouth in the morning and at bedtime. 180 capsule 1   rosuvastatin (CRESTOR) 20 MG tablet Take 1 tablet (20 mg total) by mouth daily. 90 tablet 2   rosuvastatin (CRESTOR) 20 MG tablet Take 1 tablet (20 mg total) by mouth daily. for cholesterol. 14 tablet 0   senna (SENNA-TIME) 8.6 MG tablet Take 1 tablet by mouth 2 (two) times daily.     Turmeric 400 MG CAPS Take 400 mg by mouth daily.     No current facility-administered medications on file prior to visit.    There were no vitals taken for this visit. Objective:   Physical Exam        Assessment & Plan:  There are no diagnoses linked to this encounter.      Doreene Nest, NP

## 2023-02-24 ENCOUNTER — Ambulatory Visit
Admission: RE | Admit: 2023-02-24 | Discharge: 2023-02-24 | Disposition: A | Discharge status: Home / Self Care | Payer: Medicare Other | Source: Ambulatory Visit | Attending: Primary Care | Admitting: Primary Care

## 2023-02-24 DIAGNOSIS — E2839 Other primary ovarian failure: Secondary | ICD-10-CM

## 2023-02-24 DIAGNOSIS — Z1231 Encounter for screening mammogram for malignant neoplasm of breast: Secondary | ICD-10-CM | POA: Insufficient documentation

## 2023-02-26 ENCOUNTER — Other Ambulatory Visit: Payer: Self-pay | Admitting: Primary Care

## 2023-02-26 DIAGNOSIS — R928 Other abnormal and inconclusive findings on diagnostic imaging of breast: Secondary | ICD-10-CM

## 2023-03-02 ENCOUNTER — Ambulatory Visit: Payer: Medicare Other

## 2023-03-04 ENCOUNTER — Ambulatory Visit
Admission: RE | Admit: 2023-03-04 | Discharge: 2023-03-04 | Disposition: A | Discharge status: Home / Self Care | Payer: Medicare Other | Source: Ambulatory Visit | Attending: Primary Care | Admitting: Primary Care

## 2023-03-04 DIAGNOSIS — R928 Other abnormal and inconclusive findings on diagnostic imaging of breast: Secondary | ICD-10-CM | POA: Insufficient documentation

## 2023-03-05 ENCOUNTER — Other Ambulatory Visit: Payer: Self-pay | Admitting: Primary Care

## 2023-03-05 DIAGNOSIS — N63 Unspecified lump in unspecified breast: Secondary | ICD-10-CM

## 2023-03-05 DIAGNOSIS — N6489 Other specified disorders of breast: Secondary | ICD-10-CM

## 2023-03-05 DIAGNOSIS — R928 Other abnormal and inconclusive findings on diagnostic imaging of breast: Secondary | ICD-10-CM

## 2023-03-09 ENCOUNTER — Encounter: Payer: Self-pay | Admitting: Primary Care

## 2023-03-09 ENCOUNTER — Other Ambulatory Visit: Payer: Self-pay | Admitting: Primary Care

## 2023-03-09 DIAGNOSIS — R928 Other abnormal and inconclusive findings on diagnostic imaging of breast: Secondary | ICD-10-CM

## 2023-03-09 DIAGNOSIS — N6489 Other specified disorders of breast: Secondary | ICD-10-CM

## 2023-03-09 DIAGNOSIS — N63 Unspecified lump in unspecified breast: Secondary | ICD-10-CM

## 2023-03-11 IMAGING — MG MM DIGITAL DIAGNOSTIC UNILAT*L* W/ TOMO W/ CAD
8 series · 8 of 24 positions shown · non-contrast
Comparison: Previous exam(s).

CLINICAL DATA: Recall from screening mammography, possible subtle
architectural distortion in the subareolar LEFT breast. The patient
has longstanding inversion of the LEFT nipple

EXAM:
DIGITAL DIAGNOSTIC UNILATERAL LEFT MAMMOGRAM WITH TOMOSYNTHESIS AND
CAD
TECHNIQUE: Left digital diagnostic mammography and breast tomosynthesis was
performed. The images were evaluated with computer-aided detection.

[L CC synth-2D (1 of 2)]
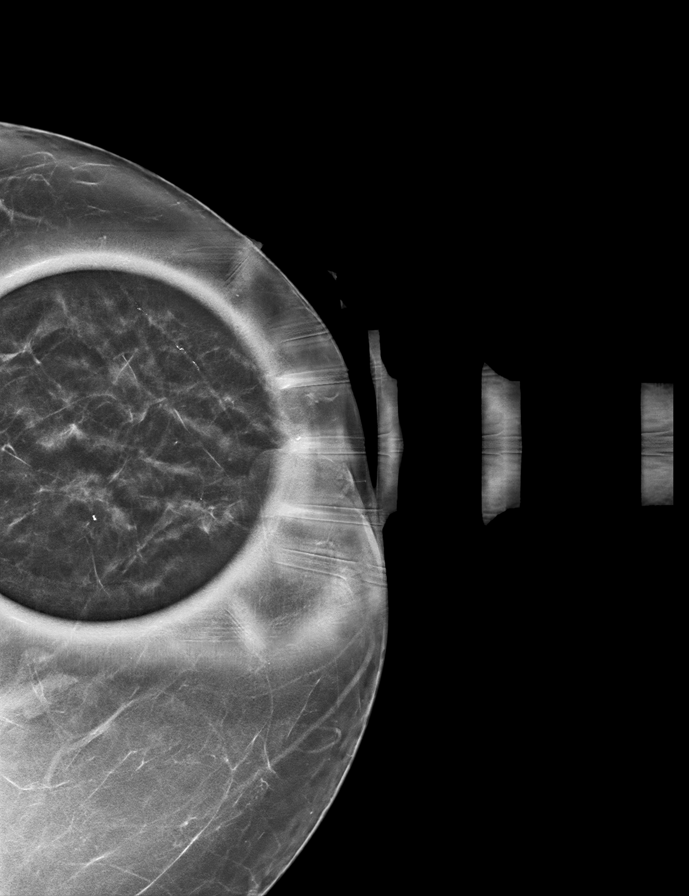

[L MLO synth-2D (1 of 2)]
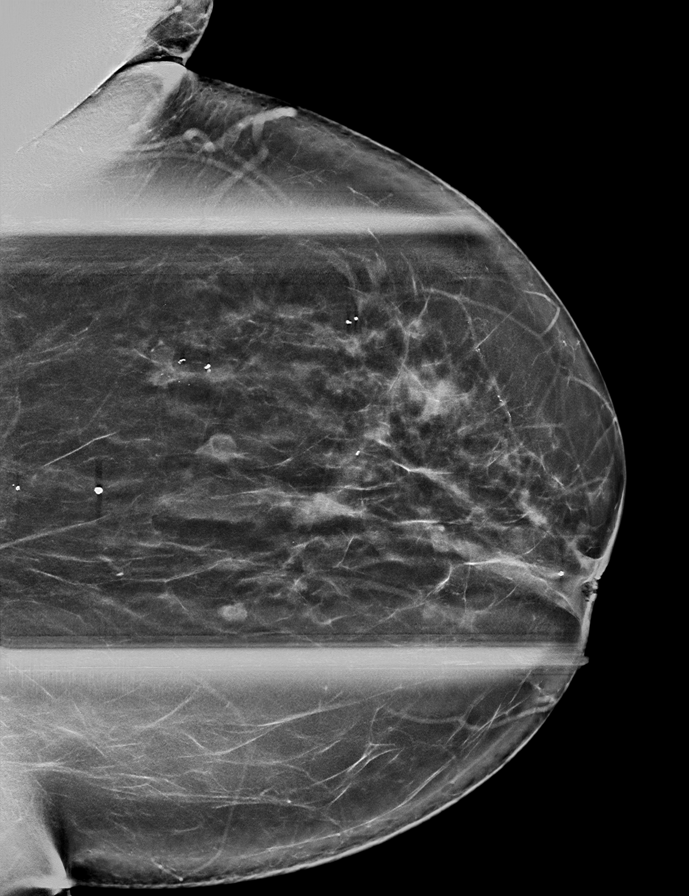

[L CC synth-2D (2 of 2)]
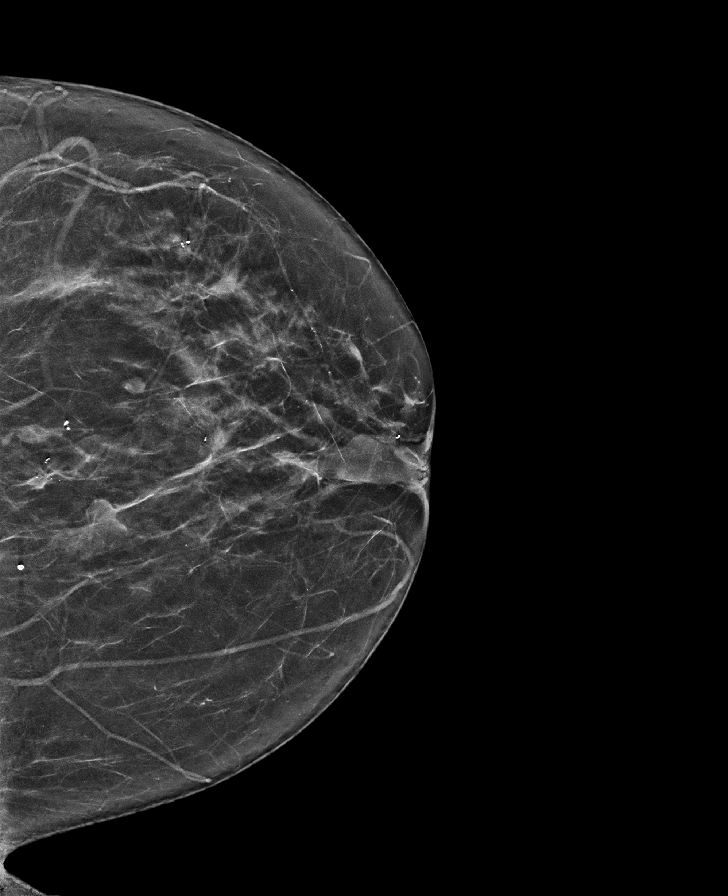

[L MLO synth-2D (2 of 2)]
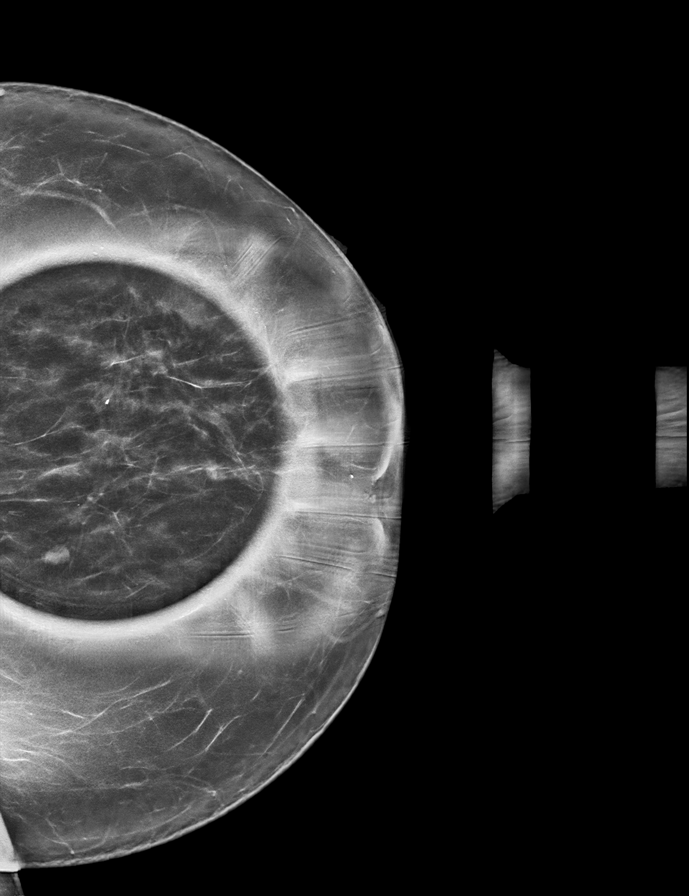

[L MLO tomo (1 of 2) · tomo slice 37/74.0]
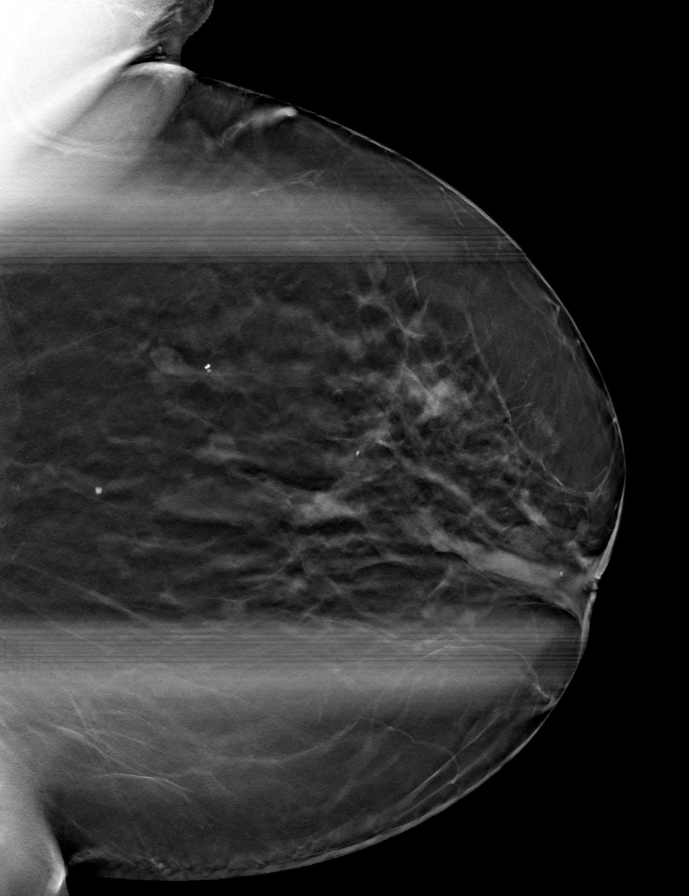

[L CC tomo (1 of 2) · tomo slice 31/60.0]
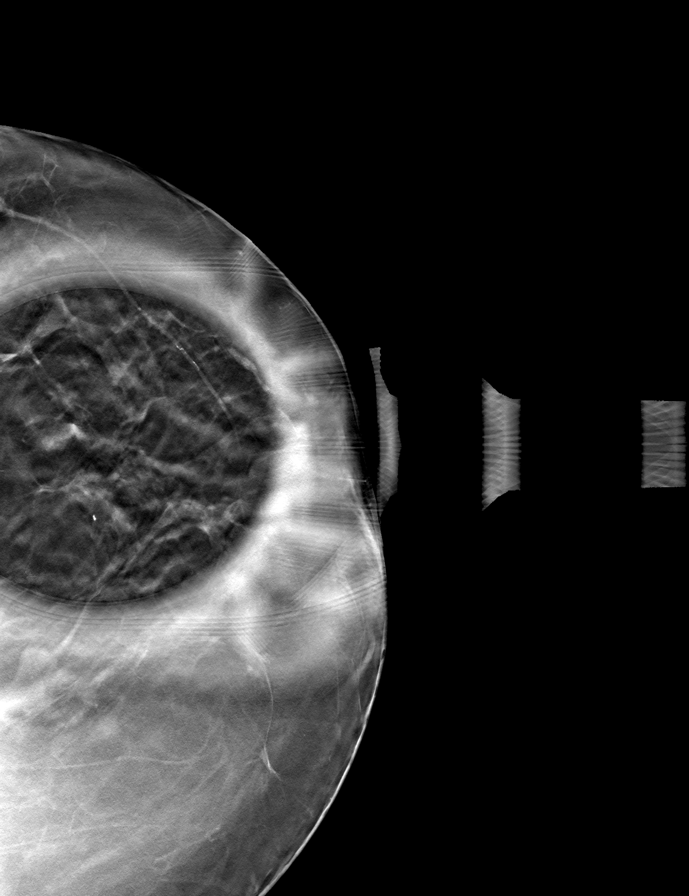

[L CC tomo (2 of 2) · tomo slice 37/72.0]
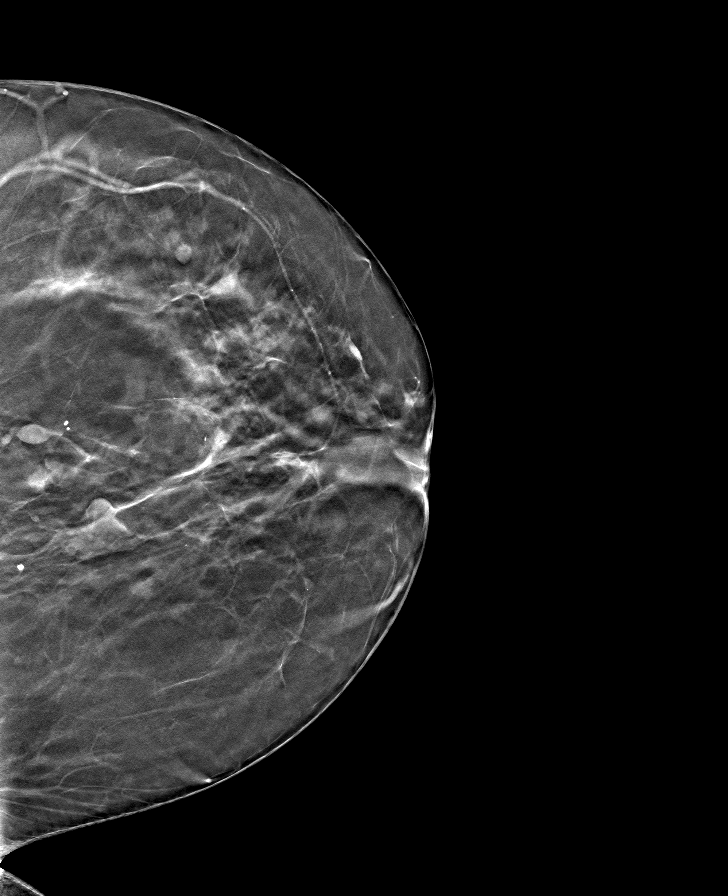

[L MLO tomo (2 of 2) · tomo slice 35/70.0]
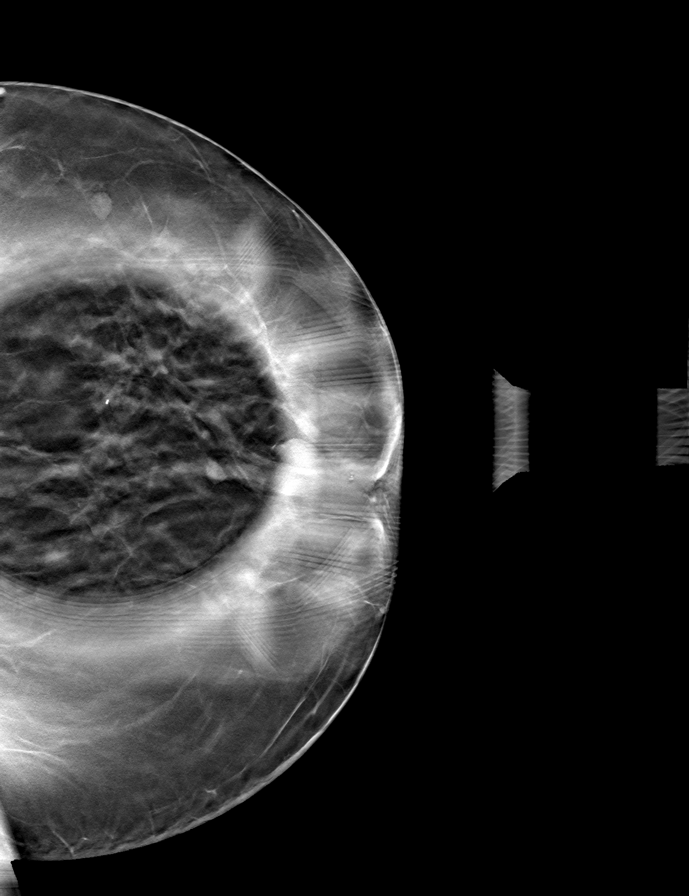

[8 of 24 positions shown; findings below may reference images not displayed]

ACR Breast Density Category b: There are scattered areas of
fibroglandular density.
FINDINGS: Spot-compression CC and MLO views of the area of concern and a
repeat full field CC view with the nipple in profile were obtained.

No persistent architectural distortion on the spot compression views
as questioned on screening mammography. The inverted nipple is
unchanged in appearance dating back to the 9195 mammogram. Scattered
circumscribed isodense masses throughout the breast are unchanged
since the mammogram in January 2020, and have waxed and waned in
size compared to the 9195 mammogram, confirming benignity.

No findings suspicious for malignancy.
IMPRESSION: No mammographic evidence of malignancy involving the LEFT breast.

RECOMMENDATION:
Screening mammogram in one year.(Code:QI-8-9O9)

I have discussed the findings and recommendations with the patient.
If applicable, a reminder letter will be sent to the patient
regarding the next appointment.

BI-RADS CATEGORY  2: Benign.

## 2023-03-13 ENCOUNTER — Other Ambulatory Visit: Payer: Medicare Other

## 2023-03-16 ENCOUNTER — Ambulatory Visit (INDEPENDENT_AMBULATORY_CARE_PROVIDER_SITE_OTHER): Payer: Medicare Other

## 2023-03-16 VITALS — Ht 67.5 in | Wt 247.0 lb

## 2023-03-16 DIAGNOSIS — Z0000 Encounter for general adult medical examination without abnormal findings: Secondary | ICD-10-CM | POA: Diagnosis not present

## 2023-03-16 NOTE — Progress Notes (Signed)
Subjective:   Paige Hester is a 67 y.o. female who presents for Medicare Annual (Subsequent) preventive examination.  Visit Complete: Virtual I connected with  ASUNCION LUDLUM on 03/16/23 by a audio enabled telemedicine application and verified that I am speaking with the correct person using two identifiers.  Patient Location: Home  Provider Location: Office/Clinic  I discussed the limitations of evaluation and management by telemedicine. The patient expressed understanding and agreed to proceed.  Vital Signs: Because this visit was a virtual/telehealth visit, some criteria may be missing or patient reported. Any vitals not documented were not able to be obtained and vitals that have been documented are patient reported.  Patient Medicare AWV questionnaire was completed by the patient on 03/12/2023; I have confirmed that all information answered by patient is correct and no changes since this date.  Cardiac Risk Factors include: advanced age (>8men, >56 women);dyslipidemia;hypertension;obesity (BMI >30kg/m2);sedentary lifestyle    Objective:    Today's Vitals   03/12/23 1157 03/16/23 1028  Weight:  247 lb (112 kg)  Height:  5' 7.5" (1.715 m)  PainSc: 8     Body mass index is 38.11 kg/m.     03/16/2023   10:38 AM 12/31/2021   11:05 AM 06/26/2021   11:42 AM 02/25/2021    3:55 PM 12/13/2020    5:57 PM 07/11/2020   11:00 PM 07/11/2020    5:46 PM  Advanced Directives  Does Patient Have a Medical Advance Directive? No No No Yes No No No  Does patient want to make changes to medical advance directive?    No - Patient declined     Would patient like information on creating a medical advance directive?  No - Patient declined No - Patient declined  No - Patient declined Yes (Inpatient - patient requests chaplain consult to create a medical advance directive)     Current Medications (verified) Outpatient Encounter Medications as of 03/16/2023  Medication Sig    albuterol (VENTOLIN HFA) 108 (90 Base) MCG/ACT inhaler Inhale 2 puffs into the lungs every 6 (six) hours as needed for wheezing or shortness of breath.   amLODipine (NORVASC) 10 MG tablet Take 1 tablet by mouth once daily for blood pressure   amLODipine (NORVASC) 10 MG tablet Take 1 tablet (10 mg total) by mouth daily. for blood pressure.   aspirin EC 81 MG tablet Take 81 mg by mouth daily. Swallow whole.   Cholecalciferol (VITAMIN D3) 1000 UNITS tablet Take 1,000 Units by mouth daily.   fluticasone (FLONASE) 50 MCG/ACT nasal spray Place 1 spray into both nostrils 2 (two) times daily as needed for allergies or rhinitis.   loratadine (CLARITIN) 10 MG tablet Take 10 mg by mouth daily.   losartan-hydrochlorothiazide (HYZAAR) 100-25 MG tablet Take 1 tablet by mouth once daily for blood pressure   losartan-hydrochlorothiazide (HYZAAR) 100-25 MG tablet Take 1 tablet by mouth daily. .for blood pressure.   Melatonin 10 MG TABS Take by mouth as needed.   omeprazole (PRILOSEC) 20 MG capsule Take 1 capsule (20 mg total) by mouth daily. For heartburn   omeprazole (PRILOSEC) 20 MG capsule Take 1 capsule (20 mg total) by mouth daily. for heartburn.   Potassium Bicarbonate 99 MG CAPS Take 1 capsule (99 mg total) by mouth in the morning and at bedtime.   rosuvastatin (CRESTOR) 20 MG tablet Take 1 tablet (20 mg total) by mouth daily.   rosuvastatin (CRESTOR) 20 MG tablet Take 1 tablet (20 mg total) by mouth daily. for  cholesterol.   senna (SENNA-TIME) 8.6 MG tablet Take 1 tablet by mouth 2 (two) times daily.   Turmeric 400 MG CAPS Take 400 mg by mouth daily.   No facility-administered encounter medications on file as of 03/16/2023.    Allergies (verified) Patient has no known allergies.   History: Past Medical History:  Diagnosis Date   Allergy    Bilateral swelling of feet and ankles    Gallbladder polyp 2012   GERD (gastroesophageal reflux disease)    Hyperlipidemia    Hypertension    Iron  deficiency anemia    Menopausal symptoms    since 2007   Murmur, cardiac    Other fatigue    Prediabetes    Pulmonary embolism (HCC) 06/2020   Shortness of breath    with exertion; patient going to pulmonary rehabilitation   Shortness of breath on exertion    Sleep apnea    Uses CPAP   Vitamin D deficiency    Past Surgical History:  Procedure Laterality Date   COLONOSCOPY  2010   2020   HYSTEROSCOPY WITH D & C N/A 07/27/2019   Procedure: DILATATION AND CURETTAGE /HYSTEROSCOPY, Polypectomy;  Surgeon: Tereso Newcomer, MD;  Location: [facility];  Service: Gynecology;  Laterality: N/A;   HYSTEROSCOPY WITH D & C N/A 06/26/2021   Procedure: DILATATION AND CURETTAGE /HYSTEROSCOPY;  Surgeon: Tereso Newcomer, MD;  Location: [facility];  Service: Gynecology;  Laterality: N/A;   INTRAUTERINE DEVICE (IUD) INSERTION N/A 06/26/2021   Procedure: INTRAUTERINE DEVICE (IUD) INSERTION;  Surgeon: Tereso Newcomer, MD;  Location: [facility];  Service: Gynecology;  Laterality: N/A;   Family History  Problem Relation Age of Onset   Depression Mother    Stroke Mother    Sudden death Mother    Heart disease Mother    Hypertension Mother    Heart attack Mother 54   Cancer Father    Prostate cancer Father    Heart disease Father 70       CAD, STENT   Coronary artery disease Sister        stent x 2   Coronary artery disease Sister 59       stent placed   Hypertension Sister    Hypertension Sister    Heart disease Brother        Myocardial infarction   Colon cancer Maternal Aunt    Sudden death Other    Depression Other    Colon polyps Neg Hx    Esophageal cancer Neg Hx    Rectal cancer Neg Hx    Stomach cancer Neg Hx    Breast cancer Neg Hx    Social History   Socioeconomic History   Marital status: Legally Separated    Spouse name: Not on file   Number of children: 1   Years of education: Not on file   Highest education level:  Bachelor's degree (e.g., BA, AB, BS)  Occupational History   Occupation: Retired  Tobacco Use   Smoking status: Former    Current packs/day: 0.00    Average packs/day: 1 pack/day for 27.0 years (27.0 ttl pk-yrs)    Types: Cigarettes    Start date: 22    Quit date: 1999    Years since quitting: 26.0   Smokeless tobacco: Never  Vaping Use   Vaping status: Never Used  Substance and Sexual Activity   Alcohol use: Yes    Alcohol/week: 7.0 standard drinks of alcohol  Types: 7 Glasses of wine per week    Comment: 1-2 glasses of wine per night   Drug use: No   Sexual activity: Yes  Other Topics Concern   Not on file  Social History Narrative   Married.   1 child, 1 grandchild.   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   Enjoys going to [employer], singing.    Social Drivers of Corporate investment banker Strain: Low Risk  (03/16/2023)   Overall Financial Resource Strain (CARDIA)    Difficulty of Paying Living Expenses: Not hard at all  Food Insecurity: No Food Insecurity (03/16/2023)   Hunger Vital Sign    Worried About Running Out of Food in the Last Year: Never true    Ran Out of Food in the Last Year: Never true  Transportation Needs: No Transportation Needs (03/16/2023)   PRAPARE - Administrator, Civil Service (Medical): No    Lack of Transportation (Non-Medical): No  Physical Activity: Insufficiently Active (03/16/2023)   Exercise Vital Sign    Days of Exercise per Week: 1 day    Minutes of Exercise per Session: 10 min  Stress: No Stress Concern Present (03/16/2023)   [employer] of Occupational Health - Occupational Stress Questionnaire    Feeling of Stress : Not at all  Social Connections: Moderately Integrated (03/16/2023)   Social Connection and Isolation Panel [NHANES]    Frequency of Communication with Friends and Family: More than three times a week    Frequency of Social Gatherings with Friends and  Family: Twice a week    Attends Religious Services: More than 4 times per year    Active Member of [employer] or Organizations: Yes    Attends Engineer, structural: More than 4 times per year    Marital Status: Separated    Tobacco Counseling Counseling given: Not Answered  Clinical Intake:  Pre-visit preparation completed: No  Pain : 0-10 Pain Score: 8  Pain Type: Chronic pain Pain Location: Finger (Comment which one) (ring finger"trigger finger) Pain Orientation: Left Pain Descriptors / Indicators: Aching Pain Onset: More than a month ago Pain Frequency: Constant Pain Relieving Factors: nothing helps  Pain Relieving Factors: nothing helps  BMI - recorded: 38.11 Nutritional Status: BMI > 30  Obese Nutritional Risks: None Diabetes: No  How often do you need to have someone help you when you read instructions, pamphlets, or other written materials from your doctor or pharmacy?: 1 - Never  Interpreter Needed?: No  Comments: lives alone Information entered by :: B.Bentlee Benningfield,LPN   Activities of Daily Living    03/12/2023   11:57 AM  In your present state of health, do you have any difficulty performing the following activities:  Hearing? 0  Vision? 0  Difficulty concentrating or making decisions? 0  Walking or climbing stairs? 0  Dressing or bathing? 0  Doing errands, shopping? 0  Preparing Food and eating ? N  Using the Toilet? N  In the past six months, have you accidently leaked urine? Y  Do you have problems with loss of bowel control? N  Managing your Medications? N  Managing your Finances? N  Housekeeping or managing your Housekeeping? N    Patient Care Team: Doreene Nest, NP as PCP - General (Nurse Practitioner)  Indicate any recent Medical Services you may have received from other than [facility] providers in the past year (date may be approximate).     Assessment:  This is a routine wellness examination for Vanya.  Hearing/Vision  screen Hearing Screening - Comments:: Pt says she hears good Vision Screening - Comments:: Pt says she wears glasses and sees well  [employer]   Goals Addressed               This Visit's Progress     Increase physical activity (pt-stated)   Not on track     Weight loss goal 250lb Increase physical activity with Senior Sneaker program        Depression Screen    03/16/2023   10:35 AM 11/18/2022    2:18 PM 08/06/2022   11:38 AM 07/15/2022   12:17 PM 12/31/2021   11:08 AM 10/30/2021    2:16 PM 09/02/2021    3:40 PM  PHQ 2/9 Scores  PHQ - 2 Score 0 1 0 1 0 0 0  PHQ- 9 Score   1 3  0 5    Fall Risk    03/12/2023   11:57 AM 11/18/2022    2:18 PM 08/06/2022   11:38 AM 07/15/2022   12:17 PM 07/03/2022   11:22 AM  Fall Risk   Falls in the past year? 0 0 0 0 0  Number falls in past yr:  0 0 0 0  Injury with Fall?  0 0 0 0  Risk for fall due to : No Fall Risks No Fall Risks No Fall Risks No Fall Risks No Fall Risks  Follow up Education provided;Falls prevention discussed Falls evaluation completed Falls evaluation completed Falls evaluation completed Falls evaluation completed    MEDICARE RISK AT HOME: Medicare Risk at Home Any stairs in or around the home?: (Patient-Rptd) Yes If so, are there any without handrails?: (Patient-Rptd) Yes Home free of loose throw rugs in walkways, pet beds, electrical cords, etc?: (Patient-Rptd) Yes Adequate lighting in your home to reduce risk of falls?: (Patient-Rptd) Yes Life alert?: (Patient-Rptd) No Use of a cane, walker or w/c?: (Patient-Rptd) No Grab bars in the bathroom?: (Patient-Rptd) Yes Shower chair or bench in shower?: (Patient-Rptd) Yes Elevated toilet seat or a handicapped toilet?: (Patient-Rptd) Yes  TIMED UP AND GO:  Was the test performed?  No    Cognitive Function:        03/16/2023   10:39 AM 12/31/2021   11:18 AM  6CIT Screen  What Year? 0 points 0 points  What month? 0 points 0 points   What time? 0 points 0 points  Count back from 20 0 points 0 points  Months in reverse 0 points 0 points  Repeat phrase 0 points 0 points  Total Score 0 points 0 points    Immunizations Immunization History  Administered Date(s) Administered   Fluad Quad(high Dose 65+) 12/27/2020, 12/17/2021   Influenza Whole 04/01/2011   Influenza,inj,Quad PF,6+ Mos 11/30/2012, 12/22/2018   Influenza-Unspecified 12/25/2022   Moderna Sars-Covid-2 Vaccination 11/17/2019, 12/15/2019, 06/06/2020   PNEUMOCOCCAL CONJUGATE-20 10/18/2020   Pneumococcal Polysaccharide-23 10/31/2021   Tdap 05/31/2012   Zoster Recombinant(Shingrix) 10/12/2018, 12/22/2018    TDAP status: Up to date  Flu Vaccine status: Up to date  Pneumococcal vaccine status: Up to date  Covid-19 vaccine status: Completed vaccines  Qualifies for Shingles Vaccine? Yes   Zostavax completed Yes   Shingrix Completed?: Yes  Screening Tests Health Maintenance  Topic Date Due   Medicare Annual Wellness (AWV)  03/15/2024   MAMMOGRAM  02/23/2025   Colonoscopy  09/23/2025   Pneumonia Vaccine 54+ Years old  Completed  INFLUENZA VACCINE  Completed   DEXA SCAN  Completed   Hepatitis C Screening  Completed   Zoster Vaccines- Shingrix  Completed   HPV VACCINES  Aged Out   DTaP/Tdap/Td  Discontinued   COVID-19 Vaccine  Discontinued    Health Maintenance  There are no preventive care reminders to display for this patient.   Colorectal cancer screening: Type of screening: Colonoscopy. Completed 09/24/2018. Repeat every 7 years  Mammogram status: Completed 02/24/23. Repeat every year  Bone Density status: Completed 02/24/2023. Results reflect: Bone density results: NORMAL. Repeat every 5 years.  Lung Cancer Screening: (Low Dose CT Chest recommended if Age 72-80 years, 20 pack-year currently smoking OR have quit w/in 15years.) does not qualify.   Lung Cancer Screening Referral: no  Additional Screening:  Hepatitis C Screening:  does not qualify; Completed 08/27/2018  Vision Screening: Recommended annual ophthalmology exams for early detection of glaucoma and other disorders of the eye. Is the patient up to date with their annual eye exam?  Yes  Who is the provider or what is the name of the office in which the patient attends annual eye exams? [facility] If pt is not established with a provider, would they like to be referred to a provider to establish care? No .   Dental Screening: Recommended annual dental exams for proper oral hygiene  Diabetic Foot Exam: n/a  Community Resource Referral / Chronic Care Management: CRR required this visit?  No   CCM required this visit?  Appt made w/Dr Copland   Plan:     I have personally reviewed and noted the following in the patient's chart:   Medical and social history Use of alcohol, tobacco or illicit drugs  Current medications and supplements including opioid prescriptions. Patient is not currently taking opioid prescriptions. Functional ability and status Nutritional status Physical activity Advanced directives List of other physicians Hospitalizations, surgeries, and ER visits in previous 12 months Vitals Screenings to include cognitive, depression, and falls Referrals and appointments  In addition, I have reviewed and discussed with patient certain preventive protocols, quality metrics, and best practice recommendations. A written personalized care plan for preventive services as well as general preventive health recommendations were provided to patient.    Sue Lush, LPN   16/12/9602   After Visit Summary: (MyChart) Due to this being a telephonic visit, the after visit summary with patients personalized plan was offered to patient via MyChart   Nurse Notes: Pt says she is doing well other than managing pain from a trigger at left ring finger. Appt made w/ortho provider. Pt says she has a lft breast bx tomorrow and carotid u/s next week.

## 2023-03-16 NOTE — Patient Instructions (Signed)
[name] , Thank you for taking time to come for your Medicare Wellness Visit. I appreciate your ongoing commitment to your health goals. Please review the following plan we discussed and let me know if I can assist you in the future.   Referrals/Orders/Follow-Ups/Clinician Recommendations: none  This is a list of the screening recommended for you and due dates:  Health Maintenance  Topic Date Due   Medicare Annual Wellness Visit  03/15/2024   Mammogram  02/23/2025   Colon Cancer Screening  09/23/2025   Pneumonia Vaccine  Completed   Flu Shot  Completed   DEXA scan (bone density measurement)  Completed   Hepatitis C Screening  Completed   Zoster (Shingles) Vaccine  Completed   HPV Vaccine  Aged Out   DTaP/Tdap/Td vaccine  Discontinued   COVID-19 Vaccine  Discontinued    Advanced directives: (Declined) Advance directive discussed with you today. Even though you declined this today, please call our office should you change your mind, and we can give you the proper paperwork for you to fill out.  Next Medicare Annual Wellness Visit scheduled for next year: Yes 03/16/2024 @ 1pm televisit

## 2023-03-17 ENCOUNTER — Other Ambulatory Visit: Payer: Self-pay | Admitting: Primary Care

## 2023-03-17 ENCOUNTER — Ambulatory Visit
Admission: RE | Admit: 2023-03-17 | Discharge: 2023-03-17 | Disposition: A | Discharge status: Home / Self Care | Payer: Medicare Other | Source: Ambulatory Visit | Attending: Primary Care | Admitting: Primary Care

## 2023-03-17 DIAGNOSIS — R928 Other abnormal and inconclusive findings on diagnostic imaging of breast: Secondary | ICD-10-CM | POA: Insufficient documentation

## 2023-03-17 DIAGNOSIS — C50412 Malignant neoplasm of upper-outer quadrant of left female breast: Secondary | ICD-10-CM | POA: Insufficient documentation

## 2023-03-17 HISTORY — PX: BREAST BIOPSY: SHX20

## 2023-03-17 MED ORDER — LIDOCAINE 1 % OPTIME INJ - NO CHARGE
5.0000 mL | Freq: Once | INTRAMUSCULAR | Status: AC
  Administered 2023-03-17: 5 mL
  Filled 2023-03-17: qty 6

## 2023-03-17 MED ORDER — LIDOCAINE HCL 1 % IJ SOLN
10.0000 mL | Freq: Once | INTRAMUSCULAR | Status: DC
Start: 2023-03-17 — End: 2023-03-18
  Filled 2023-03-17: qty 10

## 2023-03-17 MED ORDER — LIDOCAINE HCL (PF) 2 % IJ SOLN
5.0000 mL | Freq: Once | INTRAMUSCULAR | Status: AC
  Administered 2023-03-17: 5 mL
  Filled 2023-03-17: qty 5

## 2023-03-17 MED ORDER — LIDOCAINE-EPINEPHRINE 2 %-1:100000 IJ SOLN
10.0000 mL | Freq: Once | INTRAMUSCULAR | Status: AC
  Administered 2023-03-17: 10 mL
  Filled 2023-03-17: qty 10.2

## 2023-03-19 ENCOUNTER — Ambulatory Visit: Payer: Medicare Other | Attending: Primary Care

## 2023-03-19 DIAGNOSIS — I6523 Occlusion and stenosis of bilateral carotid arteries: Secondary | ICD-10-CM | POA: Diagnosis present

## 2023-03-20 ENCOUNTER — Encounter: Payer: Self-pay | Admitting: Primary Care

## 2023-03-20 LAB — SURGICAL PATHOLOGY

## 2023-03-22 NOTE — Progress Notes (Signed)
 Paige Diffley T. Kailey Esquilin, MD, CAQ Sports Medicine [facility] at [facility] [address] [place], [address]  Phone: [phone]  FAX: [phone]  Paige Hester - 68 y.o. female  MRN [id number]  Date of Birth: November 25, 1955  Date: 03/23/2023  PCP: Paige Comer POUR, NP  Referral: Paige Comer POUR, NP  Chief Complaint  Patient presents with   Hand Pain    Left Ring Finger   Subjective:   Paige Hester is a 68 y.o. very pleasant female patient with Body mass index is 39.7 kg/m. who presents with the following:  Patient presents with left sided fourth digit stiffness and tenderness.  She does not have a history of prior traumatic injury or fracture.  I did see her in April 2024 with a trigger finger on the left fourth digit.  At this point predominantly she has had some restriction of motion at the DIP joint and to lesser extent the PIP joint, and she is not able to make a full composite fist with the affected finger.  Review of Systems is noted in the HPI, as appropriate  Objective:   BP (!) 144/80 (BP Location: Right Arm, Patient Position: Sitting, Cuff Size: Large)   Pulse 79   Temp 98.8 F (37.1 C) (Temporal)   Ht 5' 7.5 (1.715 m)   Wt 257 lb 4 oz (116.7 kg)   SpO2 96%   BMI 39.70 kg/m   GEN: No acute distress; alert,appropriate. PULM: Breathing comfortably in no respiratory distress PSYCH: Normally interactive.   All bony anatomy of the hand and wrist is entirely normal and nontender. Minimal to no tenderness at the DIP and PIP joints as well as the MCP joint on the 4th digit.  At the fourth digit she has some restriction of flexion, more notable in the DIP joint, and she is unable to make a full composite fist with the fourth digit.  She has some mild tenderness along the flexor tendon sheath and mild, but still noticeable around the metacarpal head   Laboratory and Imaging Data:  Assessment and Plan:     ICD-10-CM   1.  Finger stiffness, left  M25.642     2. Finger pain, left  M79.645      I am the primary issue here is the stiffness at the DIP greater than PIP joint.  I am going to give her a round of steroids to try to calm down swelling or tenosynovitis, however I am asked her to work on her motion multiple times a day, upwards of 8 times a day if possible.  She is going to let me know how she is doing in a few weeks, and if she is still having issues then I think hand therapy referral would be most appropriate.  Medication Management during today's office visit: Meds ordered this encounter  Medications   predniSONE  (DELTASONE ) 20 MG tablet    Sig: 2 tabs po daily for 5 days, then 1 tab po daily for 5 days    Dispense:  15 tablet    Refill:  0   Medications Discontinued During This Encounter  Medication Reason   senna (SENNA-TIME) 8.6 MG tablet Duplicate   Turmeric 400 MG CAPS Duplicate   rosuvastatin  (CRESTOR ) 20 MG tablet Duplicate   omeprazole  (PRILOSEC) 20 MG capsule Duplicate   amLODipine  (NORVASC ) 10 MG tablet Duplicate   losartan -hydrochlorothiazide  (HYZAAR) 100-25 MG tablet Duplicate    Orders placed today for conditions managed today: No  orders of the defined types were placed in this encounter.   Disposition: No follow-ups on file.  [employer] speech-to-text software was used for transcription in this dictation.  Possible transcriptional errors can occur using Animal nutritionist.   Signed,  Paige Hester. Paige Chaikin, MD   Outpatient Encounter Medications as of 03/23/2023  Medication Sig   albuterol  (VENTOLIN  HFA) 108 (90 Base) MCG/ACT inhaler Inhale 2 puffs into the lungs every 6 (six) hours as needed for wheezing or shortness of breath.   amLODipine  (NORVASC ) 10 MG tablet Take 1 tablet (10 mg total) by mouth daily. for blood pressure.   aspirin  EC 81 MG tablet Take 81 mg by mouth daily. Swallow whole.   Cholecalciferol (VITAMIN D3) 1000 UNITS tablet Take 1,000 Units by mouth  daily.   fluticasone  (FLONASE ) 50 MCG/ACT nasal spray Place 1 spray into both nostrils 2 (two) times daily as needed for allergies or rhinitis.   loratadine (CLARITIN) 10 MG tablet Take 10 mg by mouth daily.   losartan -hydrochlorothiazide  (HYZAAR) 100-25 MG tablet Take 1 tablet by mouth daily. .for blood pressure.   Melatonin 10 MG TABS Take by mouth as needed.   omeprazole  (PRILOSEC) 20 MG capsule Take 1 capsule (20 mg total) by mouth daily. for heartburn.   Potassium Bicarbonate  99 MG CAPS Take 1 capsule (99 mg total) by mouth in the morning and at bedtime.   predniSONE  (DELTASONE ) 20 MG tablet 2 tabs po daily for 5 days, then 1 tab po daily for 5 days   rosuvastatin  (CRESTOR ) 20 MG tablet Take 1 tablet (20 mg total) by mouth daily. for cholesterol.   [DISCONTINUED] amLODipine  (NORVASC ) 10 MG tablet Take 1 tablet by mouth once daily for blood pressure   [DISCONTINUED] losartan -hydrochlorothiazide  (HYZAAR) 100-25 MG tablet Take 1 tablet by mouth once daily for blood pressure   [DISCONTINUED] omeprazole  (PRILOSEC) 20 MG capsule Take 1 capsule (20 mg total) by mouth daily. For heartburn   [DISCONTINUED] rosuvastatin  (CRESTOR ) 20 MG tablet Take 1 tablet (20 mg total) by mouth daily.   [DISCONTINUED] senna (SENNA-TIME) 8.6 MG tablet Take 1 tablet by mouth 2 (two) times daily.   [DISCONTINUED] Turmeric 400 MG CAPS Take 400 mg by mouth daily.   No facility-administered encounter medications on file as of 03/23/2023.

## 2023-03-23 ENCOUNTER — Encounter: Payer: Self-pay | Admitting: *Deleted

## 2023-03-23 ENCOUNTER — Ambulatory Visit (INDEPENDENT_AMBULATORY_CARE_PROVIDER_SITE_OTHER): Payer: Medicare Other | Admitting: Family Medicine

## 2023-03-23 VITALS — BP 144/80 | HR 79 | Temp 98.8°F | Ht 67.5 in | Wt 257.2 lb

## 2023-03-23 DIAGNOSIS — M79645 Pain in left finger(s): Secondary | ICD-10-CM

## 2023-03-23 DIAGNOSIS — M25642 Stiffness of left hand, not elsewhere classified: Secondary | ICD-10-CM | POA: Diagnosis not present

## 2023-03-23 DIAGNOSIS — M65342 Trigger finger, left ring finger: Secondary | ICD-10-CM

## 2023-03-23 MED ORDER — PREDNISONE 20 MG PO TABS: ORAL_TABLET | ORAL | 0 refills | Status: DC

## 2023-03-23 NOTE — Progress Notes (Signed)
 Received referral for newly diagnosed breast cancer from [facility].  Navigation initiated.  I spoke with Ms. Shrode and gave her the names of surgeons and medical oncologists in the area.   She would like to discuss with her PCP and will give me a call back with which providers she would like to see.

## 2023-03-24 ENCOUNTER — Encounter: Payer: Self-pay | Admitting: Family Medicine

## 2023-03-24 ENCOUNTER — Encounter: Payer: Self-pay | Admitting: *Deleted

## 2023-03-24 DIAGNOSIS — C50919 Malignant neoplasm of unspecified site of unspecified female breast: Secondary | ICD-10-CM

## 2023-03-24 NOTE — Progress Notes (Signed)
 Ms. Zegarra has decided she would like to see Dr. Cathie Hoops.  She is scheduled for 1/9 at 3:00.   Referral sent to [facility] surgery for either Dr. Bedelia Person or Dr. Donell Beers per patient preference.

## 2023-03-26 ENCOUNTER — Encounter: Payer: Self-pay | Admitting: *Deleted

## 2023-03-26 ENCOUNTER — Inpatient Hospital Stay: Payer: Medicare Other | Attending: Oncology | Admitting: Oncology

## 2023-03-26 ENCOUNTER — Encounter: Payer: Self-pay | Admitting: Oncology

## 2023-03-26 ENCOUNTER — Inpatient Hospital Stay: Payer: Medicare Other

## 2023-03-26 ENCOUNTER — Encounter: Payer: Self-pay | Admitting: Genetic Counselor

## 2023-03-26 VITALS — BP 148/68 | HR 68 | Temp 97.1°F | Resp 18 | Wt 258.0 lb

## 2023-03-26 DIAGNOSIS — Z7952 Long term (current) use of systemic steroids: Secondary | ICD-10-CM | POA: Insufficient documentation

## 2023-03-26 DIAGNOSIS — Z86711 Personal history of pulmonary embolism: Secondary | ICD-10-CM

## 2023-03-26 DIAGNOSIS — K219 Gastro-esophageal reflux disease without esophagitis: Secondary | ICD-10-CM | POA: Diagnosis not present

## 2023-03-26 DIAGNOSIS — D509 Iron deficiency anemia, unspecified: Secondary | ICD-10-CM | POA: Insufficient documentation

## 2023-03-26 DIAGNOSIS — C50919 Malignant neoplasm of unspecified site of unspecified female breast: Secondary | ICD-10-CM

## 2023-03-26 DIAGNOSIS — Z87891 Personal history of nicotine dependence: Secondary | ICD-10-CM | POA: Insufficient documentation

## 2023-03-26 DIAGNOSIS — E785 Hyperlipidemia, unspecified: Secondary | ICD-10-CM | POA: Insufficient documentation

## 2023-03-26 DIAGNOSIS — C50212 Malignant neoplasm of upper-inner quadrant of left female breast: Secondary | ICD-10-CM | POA: Diagnosis present

## 2023-03-26 DIAGNOSIS — Z7189 Other specified counseling: Secondary | ICD-10-CM

## 2023-03-26 DIAGNOSIS — E559 Vitamin D deficiency, unspecified: Secondary | ICD-10-CM | POA: Insufficient documentation

## 2023-03-26 DIAGNOSIS — Z7982 Long term (current) use of aspirin: Secondary | ICD-10-CM | POA: Diagnosis not present

## 2023-03-26 DIAGNOSIS — G4730 Sleep apnea, unspecified: Secondary | ICD-10-CM | POA: Diagnosis not present

## 2023-03-26 DIAGNOSIS — Z79899 Other long term (current) drug therapy: Secondary | ICD-10-CM | POA: Insufficient documentation

## 2023-03-26 DIAGNOSIS — Z800 Family history of malignant neoplasm of digestive organs: Secondary | ICD-10-CM | POA: Diagnosis not present

## 2023-03-26 DIAGNOSIS — Z8619 Personal history of other infectious and parasitic diseases: Secondary | ICD-10-CM

## 2023-03-26 DIAGNOSIS — I10 Essential (primary) hypertension: Secondary | ICD-10-CM | POA: Insufficient documentation

## 2023-03-26 DIAGNOSIS — Z8042 Family history of malignant neoplasm of prostate: Secondary | ICD-10-CM | POA: Diagnosis not present

## 2023-03-26 DIAGNOSIS — D500 Iron deficiency anemia secondary to blood loss (chronic): Secondary | ICD-10-CM

## 2023-03-26 DIAGNOSIS — Z809 Family history of malignant neoplasm, unspecified: Secondary | ICD-10-CM | POA: Insufficient documentation

## 2023-03-26 DIAGNOSIS — Z9189 Other specified personal risk factors, not elsewhere classified: Secondary | ICD-10-CM

## 2023-03-26 DIAGNOSIS — Z170 Estrogen receptor positive status [ER+]: Secondary | ICD-10-CM | POA: Insufficient documentation

## 2023-03-26 LAB — COMPREHENSIVE METABOLIC PANEL
ALT: 14 U/L (ref 0–44)
AST: 19 U/L (ref 15–41)
Albumin: 4 g/dL (ref 3.5–5.0)
Alkaline Phosphatase: 68 U/L (ref 38–126)
Anion gap: 8 (ref 5–15)
BUN: 15 mg/dL (ref 8–23)
CO2: 27 mmol/L (ref 22–32)
Calcium: 9.9 mg/dL (ref 8.9–10.3)
Chloride: 105 mmol/L (ref 98–111)
Creatinine, Ser: 0.68 mg/dL (ref 0.44–1.00)
GFR, Estimated: >60 mL/min (ref >60)
Glucose, Bld: 100 mg/dL — ABNORMAL HIGH (ref 70–99)
Potassium: 3.3 mmol/L — ABNORMAL LOW (ref 3.5–5.1)
Sodium: 140 mmol/L (ref 135–145)
Total Bilirubin: 0.5 mg/dL (ref 0.0–1.2)
Total Protein: 7.9 g/dL (ref 6.5–8.1)

## 2023-03-26 LAB — CBC WITH DIFFERENTIAL/PLATELET
Abs Immature Granulocytes: 0.05 10*3/uL (ref 0.00–0.07)
Basophils Absolute: 0.1 10*3/uL (ref 0.0–0.1)
Basophils Relative: 1 %
Eosinophils Absolute: 0.1 10*3/uL (ref 0.0–0.5)
Eosinophils Relative: 1 %
HCT: 33.9 % — ABNORMAL LOW (ref 36.0–46.0)
Hemoglobin: 10.6 g/dL — ABNORMAL LOW (ref 12.0–15.0)
Immature Granulocytes: 1 %
Lymphocytes Relative: 31 %
Lymphs Abs: 3.4 10*3/uL (ref 0.7–4.0)
MCH: 24.5 pg — ABNORMAL LOW (ref 26.0–34.0)
MCHC: 31.3 g/dL (ref 30.0–36.0)
MCV: 78.5 fL — ABNORMAL LOW (ref 80.0–100.0)
Monocytes Absolute: 1.1 10*3/uL — ABNORMAL HIGH (ref 0.1–1.0)
Monocytes Relative: 10 %
Neutro Abs: 6.3 10*3/uL (ref 1.7–7.7)
Neutrophils Relative %: 56 %
Platelets: 437 10*3/uL — ABNORMAL HIGH (ref 150–400)
RBC: 4.32 MIL/uL (ref 3.87–5.11)
RDW: 14.9 % (ref 11.5–15.5)
WBC: 11 10*3/uL — ABNORMAL HIGH (ref 4.0–10.5)
nRBC: 0 % (ref 0.0–0.2)

## 2023-03-26 NOTE — Assessment & Plan Note (Signed)
 Patient is interested in obtaining genetic testing.  Refer to Dentist.

## 2023-03-26 NOTE — Progress Notes (Addendum)
 Hematology/Oncology Consult Note [phone] [phone]     REFERRING PROVIDER: Gretta Comer POUR, NP    CHIEF COMPLAINTS/PURPOSE OF CONSULTATION:  Left breast invasive ductal carcinoma.   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  Pathology and radiology counseling: Discussed with the patient, the details of pathology including the type of breast cancer,the clinical staging, the significance of ER, PR and HER-2/neu receptors and the implications for treatment. After reviewing the pathology in detail, we proceeded to discuss the different treatment options between surgery, radiation, chemotherapy, antiestrogen therapies.  Left axillary lymph node status was not reported.  Will contact radiology to clarify- addendum - left axillary LN negative.  Add ER/PR/HER2 profile on block 2   - addendum ER/PR +, HER2 negative.   Treatment plan: 1.  Breast conserving surgery with sentinel lymph node biopsy 2. Oncotype DX testing on the surgical specimen to determine if she would benefit from adjuvant chemo 3.  Adjuvant radiation 4.  Adjuvant antiestrogen therapy  Return to clinic based upon surgery pathology and Oncotype DX test result   Family history of cancer Patient is interested in obtaining genetic testing.  Refer to dentist.  Goals of care, counseling/discussion Curative intent.  History of pulmonary embolism Provoked by OCP use.  Currently not on anticoagulation.  Iron  deficiency anemia Microcytic anemia, history of iron  deficiency. Will add iron  TIBC ferritin  Addendum  Lab Results  Component Value Date   HGB 10.6 (L) 03/26/2023   TIBC 482 (H) 03/26/2023   IRONPCTSAT 5 (L) 03/26/2023   FERRITIN 14 03/26/2023    Recommend patient to take Vitron C 1 tab daily.    Orders Placed This Encounter  Procedures   CBC with Differential/Platelet     Standing Status:   Future    Number of Occurrences:   1    Expected Date:   03/26/2023    Expiration Date:   03/25/2024   Comprehensive metabolic panel    Standing Status:   Future    Number of Occurrences:   1    Expected Date:   03/26/2023    Expiration Date:   03/25/2024   Cancer antigen 15-3    Standing Status:   Future    Number of Occurrences:   1    Expected Date:   03/26/2023    Expiration Date:   03/25/2024   Cancer antigen 27.29    Standing Status:   Future    Number of Occurrences:   1    Expected Date:   03/26/2023    Expiration Date:   03/25/2024   Follow-up to be determined. All questions were answered. The patient knows to call the clinic with any problems, questions or concerns.  Zelphia Cap, MD, PhD [facility] Hematology Oncology 03/26/2023    HISTORY OF PRESENTING ILLNESS:  Paige Hester 68 y.o. female presents to establish care for left breast cancer I have reviewed her chart and materials related to her cancer extensively and collaborated history with the patient. Summary of oncologic history is as follows: Oncology History  Invasive carcinoma of breast (HCC)  02/26/2023 Mammogram   Bilateral screening mammogram  In the left breast, possible distortion warrants further evaluation. In the right breast, no findings suspicious for malignancy.     03/04/2023 Mammogram   Bilateral diagnostic mammogram showed  1. Left breast 7 mm irregular hypoechoic mass in the 12 o'clock position is suspicious for  malignancy. Recommend further assessment with ultrasound-guided biopsy. 2. Additional focal asymmetry with subtle distortion in the upper inner left breast posterior depth, located approximately 5.0 cm posterior to the mass, is also suspicious for malignancy. Recommend further assessment with stereotactic guided biopsy. 3. Incidentally visualized benign cyst in the left breast 11 o'clock position.   RECOMMENDATION: 1. Left breast ultrasound-guided biopsy (1  site). 2. Left breast stereotactic guided biopsy (1 site).   03/17/2023 Initial Diagnosis   Invasive carcinoma of breast [facility])  Patient underwent left breast biopsy 1. Breast, left, needle core biopsy, 12 o'clock, 7cmfn (ribbon clip) :      - INVASIVE DUCTAL CARCINOMA, SEE NOTE      - TUBULE FORMATION: SCORE 2      - NUCLEAR PLEOMORPHISM: SCORE 2      - MITOTIC COUNT: SCORE 1      - TOTAL SCORE: 5      - OVERALL GRADE: 1      - LYMPHOVASCULAR INVASION: NOT IDENTIFIED      - CANCER LENGTH: 5.5 MM      - CALCIFICATIONS: PRESENT       2. Breast, left, needle core biopsy, 11 o'clock, 15cmfn (coil clip) :      - INVASIVE DUCTAL CARCINOMA, SEE NOTE      - TUBULE FORMATION: SCORE 2      - NUCLEAR PLEOMORPHISM: SCORE 2      - MITOTIC COUNT: SCORE 1      - TOTAL SCORE: 5      - OVERALL GRADE: 1      - LYMPHOVASCULAR INVASION: NOT IDENTIFIED      - CANCER LENGTH: 4 MM      - CALCIFICATIONS: NOT IDENTIFIED       Diagnosis Note : - 2.Dr.Patrick reviewed the case and concurs with the      interpretations.Immunohistochemical staining for myoepithelial markers      (calponin, smooth muscle myosin and p63) is performed on blocks 1 and 2.The      myoepithelial markers are focally lost which is consistent with the above      diagnoses.A breast prognostic profile (ER, PR and HER2) is performed on block 1        ER 99% positive, PR 40% positive, HER2 IHC 2+, FISH negative.    03/26/2023 Cancer Staging   Staging form: Breast, AJCC 8th Edition - Clinical stage from 03/26/2023: Stage IA (cT1b, cN0, cM0, G1, ER+, PR+, HER2-) - Signed by Babara Call, MD on 03/26/2023 Stage prefix: Initial diagnosis Histologic grading system: 3 grade system    Menarche at age of 43 First live birth at age of 55 OCP use: >5 years.  She has IUD placed in May 2023. History of hysterectomy: No Menopausal status: Postmenopausal History of HRT use: No History of chest radiation: No Number of previous breast biopsies:  Unusual No family history of breast cancer.  Father with prostate cancer in maternal aunt with colon cancer.  Today patient was accompanied by sister to discuss management plan.    MEDICAL HISTORY:  Past Medical History:  Diagnosis Date   Allergy    Bilateral swelling of feet and ankles    Gallbladder polyp 2012   GERD (gastroesophageal reflux disease)    Hyperlipidemia    Hypertension    Iron  deficiency anemia    Menopausal symptoms    since 2007   Murmur, cardiac    Other fatigue    Prediabetes    Pulmonary embolism (HCC) 06/2020  Shortness of breath    with exertion; patient going to pulmonary rehabilitation   Shortness of breath on exertion    Sleep apnea    Uses CPAP   Vitamin D  deficiency     SURGICAL HISTORY: Past Surgical History:  Procedure Laterality Date   BREAST BIOPSY Left 03/17/2023   US  bx,Ribbon Clip, path pending   BREAST BIOPSY Left 03/17/2023   US  Bx, Coil Clip,path pending   BREAST BIOPSY Left 03/17/2023   US  LT BREAST BX W LOC DEV 1ST LESION IMG BX SPEC US  GUIDE 03/17/2023 [facility]   BREAST BIOPSY Left 03/17/2023   US  LT BREAST BX W LOC DEV EA ADD LESION IMG BX SPEC US  GUIDE 03/17/2023 [facility]   COLONOSCOPY  2010   2020   HYSTEROSCOPY WITH D & C N/A 07/27/2019   Procedure: DILATATION AND CURETTAGE /HYSTEROSCOPY, Polypectomy;  Surgeon: Herchel Gloris LABOR, MD;  Location: [facility];  Service: Gynecology;  Laterality: N/A;   HYSTEROSCOPY WITH D & C N/A 06/26/2021   Procedure: DILATATION AND CURETTAGE /HYSTEROSCOPY;  Surgeon: Herchel Gloris LABOR, MD;  Location: [facility];  Service: Gynecology;  Laterality: N/A;   INTRAUTERINE DEVICE (IUD) INSERTION N/A 06/26/2021   Procedure: INTRAUTERINE DEVICE (IUD) INSERTION;  Surgeon: Herchel Gloris LABOR, MD;  Location: [facility];  Service: Gynecology;  Laterality: N/A;    SOCIAL HISTORY: Social History   Socioeconomic History   Marital status:  Legally Separated    Spouse name: Not on file   Number of children: 1   Years of education: Not on file   Highest education level: Bachelor's degree (e.g., BA, AB, BS)  Occupational History   Occupation: Retired  Tobacco Use   Smoking status: Former    Current packs/day: 0.00    Average packs/day: 1 pack/day for 27.0 years (27.0 ttl pk-yrs)    Types: Cigarettes    Start date: 18    Quit date: 1999    Years since quitting: 26.0   Smokeless tobacco: Never  Vaping Use   Vaping status: Never Used  Substance and Sexual Activity   Alcohol use: Yes    Alcohol/week: 7.0 standard drinks of alcohol    Types: 7 Glasses of wine per week    Comment: 1-2 glasses of wine per night   Drug use: No   Sexual activity: Yes  Other Topics Concern   Not on file  Social History Narrative   Married.   1 child, 1 grandchild.   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   Enjoys going to [employer], singing.    Social Drivers of Health   Financial Resource Strain: High Risk (03/19/2023)   Overall Financial Resource Strain (CARDIA)    Difficulty of Paying Living Expenses: Hard  Food Insecurity: Food Insecurity Present (03/19/2023)   Hunger Vital Sign    Worried About Running Out of Food in the Last Year: Sometimes true    Ran Out of Food in the Last Year: Sometimes true  Transportation Needs: No Transportation Needs (03/19/2023)   PRAPARE - Administrator, Civil Service (Medical): No    Lack of Transportation (Non-Medical): No  Physical Activity: Inactive (03/19/2023)   Exercise Vital Sign    Days of Exercise per Week: 0 days    Minutes of Exercise per Session: 10 min  Stress: No Stress Concern Present (03/19/2023)   [employer] of Occupational Health - Occupational Stress Questionnaire    Feeling of  Stress : Not at all  Social Connections: Moderately Integrated (03/19/2023)   Social Connection and Isolation Panel [NHANES]    Frequency of  Communication with Friends and Family: More than three times a week    Frequency of Social Gatherings with Friends and Family: Three times a week    Attends Religious Services: More than 4 times per year    Active Member of Clubs or Organizations: Yes    Attends Banker Meetings: More than 4 times per year    Marital Status: Separated  Intimate Partner Violence: Not At Risk (03/16/2023)   Humiliation, Afraid, Rape, and Kick questionnaire    Fear of Current or Ex-Partner: No    Emotionally Abused: No    Physically Abused: No    Sexually Abused: No    FAMILY HISTORY: Family History  Problem Relation Age of Onset   Depression Mother    Stroke Mother    Sudden death Mother    Heart disease Mother    Hypertension Mother    Heart attack Mother 15   Cancer Father    Prostate cancer Father    Heart disease Father 21       CAD, STENT   Coronary artery disease Sister        stent x 2   Coronary artery disease Sister 42       stent placed   Hypertension Sister    Hypertension Sister    Heart disease Brother        Myocardial infarction   Colon cancer Maternal Aunt    Sudden death Other    Depression Other    Colon polyps Neg Hx    Esophageal cancer Neg Hx    Rectal cancer Neg Hx    Stomach cancer Neg Hx    Breast cancer Neg Hx     ALLERGIES:  has no known allergies.  MEDICATIONS:  Current Outpatient Medications  Medication Sig Dispense Refill   albuterol  (VENTOLIN  HFA) 108 (90 Base) MCG/ACT inhaler Inhale 2 puffs into the lungs every 6 (six) hours as needed for wheezing or shortness of breath. 8 g 2   amLODipine  (NORVASC ) 10 MG tablet Take 1 tablet (10 mg total) by mouth daily. for blood pressure. 14 tablet 0   aspirin  EC 81 MG tablet Take 81 mg by mouth daily. Swallow whole.     Cholecalciferol (VITAMIN D3) 1000 UNITS tablet Take 1,000 Units by mouth daily.     fluticasone  (FLONASE ) 50 MCG/ACT nasal spray Place 1 spray into both nostrils 2 (two) times daily as  needed for allergies or rhinitis. 16 g 0   loratadine (CLARITIN) 10 MG tablet Take 10 mg by mouth daily.     losartan -hydrochlorothiazide  (HYZAAR) 100-25 MG tablet Take 1 tablet by mouth daily. .for blood pressure. 14 tablet 0   Melatonin 10 MG TABS Take by mouth as needed.     omeprazole  (PRILOSEC) 20 MG capsule Take 1 capsule (20 mg total) by mouth daily. for heartburn. 14 capsule 0   Potassium Bicarbonate  99 MG CAPS Take 1 capsule (99 mg total) by mouth in the morning and at bedtime. 180 capsule 1   predniSONE  (DELTASONE ) 20 MG tablet 2 tabs po daily for 5 days, then 1 tab po daily for 5 days 15 tablet 0   rosuvastatin  (CRESTOR ) 20 MG tablet Take 1 tablet (20 mg total) by mouth daily. for cholesterol. 14 tablet 0   No current facility-administered medications for this visit.    Review  of Systems  Constitutional:  Negative for appetite change, chills, fatigue and fever.  HENT:   Negative for hearing loss and voice change.   Eyes:  Negative for eye problems.  Respiratory:  Negative for chest tightness and cough.   Cardiovascular:  Negative for chest pain.  Gastrointestinal:  Negative for abdominal distention, abdominal pain and blood in stool.  Endocrine: Negative for hot flashes.  Genitourinary:  Negative for difficulty urinating and frequency.   Musculoskeletal:  Negative for arthralgias.  Skin:  Negative for itching and rash.  Neurological:  Negative for extremity weakness.  Hematological:  Negative for adenopathy.  Psychiatric/Behavioral:  Negative for confusion.      PHYSICAL EXAMINATION: ECOG PERFORMANCE STATUS: 0 - Asymptomatic  Vitals:   03/26/23 1521  BP: (!) 148/68  Pulse: 68  Resp: 18  Temp: (!) 97.1 F (36.2 C)  SpO2: 100%   Filed Weights   03/26/23 1521  Weight: 258 lb (117 kg)    Physical Exam Constitutional:      General: She is not in acute distress.    Appearance: She is obese. She is not diaphoretic.  HENT:     Head: Normocephalic and atraumatic.      Nose: Nose normal.  Eyes:     General: No scleral icterus. Cardiovascular:     Rate and Rhythm: Normal rate and regular rhythm.     Heart sounds: Murmur heard.  Pulmonary:     Effort: Pulmonary effort is normal. No respiratory distress.     Breath sounds: No wheezing.  Abdominal:     General: There is no distension.     Palpations: Abdomen is soft.     Tenderness: There is no abdominal tenderness.  Musculoskeletal:        General: Normal range of motion.     Cervical back: Normal range of motion and neck supple.  Skin:    General: Skin is warm and dry.     Findings: No erythema.  Neurological:     Mental Status: She is alert and oriented to person, place, and time. Mental status is at baseline.     Motor: No abnormal muscle tone.  Psychiatric:        Mood and Affect: Mood and affect normal.     Breast exam was performed in seated and lying down position. Patient is status post left breast mass biopsy with focal tissue swelling/tenderness. No palpable breast masses bilaterally.  No palpable axillary adenopathy bilaterally.   LABORATORY DATA:  I have reviewed the data as listed    Latest Ref Rng & Units 03/26/2023    4:15 PM 06/26/2021   12:15 PM 06/24/2021   12:20 PM  CBC  WBC 4.0 - 10.5 K/uL 11.0   4.9   Hemoglobin 12.0 - 15.0 g/dL 89.3  87.3  87.7   Hematocrit 36.0 - 46.0 % 33.9  37.0  37.4   Platelets 150 - 400 K/uL 437   294       Latest Ref Rng & Units 03/26/2023    4:15 PM 11/18/2022    3:06 PM 01/17/2022   11:19 AM  CMP  Glucose 70 - 99 mg/dL 899  88  98   BUN 8 - 23 mg/dL 15  12  13    Creatinine 0.44 - 1.00 mg/dL 9.31  9.13  9.15   Sodium 135 - 145 mmol/L 140  141  140   Potassium 3.5 - 5.1 mmol/L 3.3  3.7  3.7   Chloride 98 -  111 mmol/L 105  100  102   CO2 22 - 32 mmol/L 27  32  31   Calcium  8.9 - 10.3 mg/dL 9.9  89.5  89.8   Total Protein 6.5 - 8.1 g/dL 7.9  7.2    Total Bilirubin 0.0 - 1.2 mg/dL 0.5  0.5    Alkaline Phos 38 - 126 U/L 68  74    AST 15  - 41 U/L 19  18    ALT 0 - 44 U/L 14  10       RADIOGRAPHIC STUDIES: I have personally reviewed the radiological images as listed and agreed with the findings in the report. VAS US  CAROTID Result Date: 03/19/2023 Carotid Arterial Duplex Study Patient Name:  SKYANN GANIM [facility]  Date of Exam:   03/19/2023 Medical Rec #: [id number]            Accession #:    [id number] Date of Birth: [birth year]            Patient Gender: F Patient Age:   68 years Exam Location:  [place] Procedure:      VAS US  CAROTID Referring Phys: KATHERINE CLARK --------------------------------------------------------------------------------  Risk Factors:      Hypertension, hyperlipidemia, Diabetes, past history of                    smoking, PAD. Other Factors:     Patient denied symptoms. Comparison Study:  Carotid duplex on 08/22/20 showed RICA velocities of 88/24 cm/s                    and LICA velocities of 104/23 cm/s. Bidirectional flow in the                    right vertebral artery Performing Technologist: Arley Pac RDMS, RVT, RDCS  Examination Guidelines: A complete evaluation includes B-mode imaging, spectral Doppler, color Doppler, and power Doppler as needed of all accessible portions of each vessel. Bilateral testing is considered an integral part of a complete examination. Limited examinations for reoccurring indications may be performed as noted.  Right Carotid Findings: +----------+--------+--------+--------+-------------------+--------+           PSV cm/sEDV cm/sStenosisPlaque Description Comments +----------+--------+--------+--------+-------------------+--------+ CCA Prox  110     17                                          +----------+--------+--------+--------+-------------------+--------+ CCA Distal67      14      <50%    smooth and calcific         +----------+--------+--------+--------+-------------------+--------+ ICA Prox  65      8       1-39%   smooth and calcific          +----------+--------+--------+--------+-------------------+--------+ ICA Mid   89      22                                          +----------+--------+--------+--------+-------------------+--------+ ICA Distal113     29                                          +----------+--------+--------+--------+-------------------+--------+ ECA  187             >50%                                +----------+--------+--------+--------+-------------------+--------+ +----------+--------+-------+--------+-------------------+           PSV cm/sEDV cmsDescribeArm Pressure (mmHG) +----------+--------+-------+--------+-------------------+ Subclavian270            Stenotic                    +----------+--------+-------+--------+-------------------+ +---------+--------+--------+---------------+ VertebralPSV cm/sEDV cm/sBi- directional +---------+--------+--------+---------------+ Brachial artery waveform was biphasic and rounded Left Carotid Findings: +----------+--------+--------+--------+----------------------+--------+           PSV cm/sEDV cm/sStenosisPlaque Description    Comments +----------+--------+--------+--------+----------------------+--------+ CCA Prox  93      13                                             +----------+--------+--------+--------+----------------------+--------+ CCA Distal82      15      <50%    smooth and calcific            +----------+--------+--------+--------+----------------------+--------+ ICA Prox  61      18                                             +----------+--------+--------+--------+----------------------+--------+ ICA Mid   118     28                                             +----------+--------+--------+--------+----------------------+--------+ ICA Distal134     33                                             +----------+--------+--------+--------+----------------------+--------+ ECA       211              >50%    calcific and irregular         +----------+--------+--------+--------+----------------------+--------+ +----------+--------+--------+--------+-------------------+           PSV cm/sEDV cm/sDescribeArm Pressure (mmHG) +----------+--------+--------+--------+-------------------+ [id number]             Stenotic                    +----------+--------+--------+--------+-------------------+ +---------+--------+--+--------+--+---------+ VertebralPSV cm/s83EDV cm/s18Antegrade +---------+--------+--+--------+--+---------+ Brachial artery waveform was brisk and triphasic  Summary: Right Carotid: Velocities in the right ICA are consistent with a 1-39% stenosis.                The ECA appears >50% stenosed. Left Carotid: Velocities in the left ICA are consistent with a 1-39% stenosis.               The ECA appears >50% stenosed. Vertebrals:  Left vertebral artery demonstrates antegrade flow. Right vertebral              artery demonstrates bidirectional flow. Subclavians: Bilateral subclavian arteries were stenotic. *See table(s) above for measurements and observations. Suggest follow up study in 12 months. Electronically signed by Maude Emmer  MD on 03/19/2023 at 4:50:32 PM.    Final    MM CLIP PLACEMENT LEFT Result Date: 03/17/2023 CLINICAL DATA:  Status post 2-site ultrasound-guided biopsy for 2 suspicious masses in the left breast from 11-12 o'clock. EXAM: 3D DIAGNOSTIC LEFT MAMMOGRAM POST ULTRASOUND BIOPSY COMPARISON:  Previous exam(s). FINDINGS: 3D Mammographic images were obtained following ultrasound guided biopsy of 2 masses in the left breast. The RIBBON shaped biopsy marking clip is in expected position at the site of biopsy in the left breast 12 o'clock position 7 cm from the nipple. The COIL shaped biopsy marking clip is in expected position at the site of biopsy in the left breast 11 o'clock position 15 cm from the nipple. Notably, this area corresponds with the previously  described focal asymmetry with subtle distortion in the upper inner left breast posterior depth, for which stereotactic guided biopsy had been recommended. Given the mammographic/sonographic correlation, a stereotactic guided biopsy will not be performed. IMPRESSION: Appropriate positioning of the ribbon and coil shaped biopsy marking clips at the sites of biopsy in the left breast upper inner quadrant. Final Assessment: Post Procedure Mammograms for Marker Placement Electronically Signed   By: Dirk Arrant M.D.   On: 03/17/2023 10:17   US  LT BREAST BX W LOC DEV 1ST LESION IMG BX SPEC US  GUIDE Result Date: 03/17/2023 CLINICAL DATA:  68 year old female with suspicious mass in the left breast 12 o'clock position. Of note, on real-time ultrasound prior to the planned biopsy, an additional suspicious mass was noted at 11 o'clock 15 cm from the nipple. Therefore, a 2-site ultrasound biopsy was performed. EXAM: ULTRASOUND GUIDED LEFT BREAST CORE NEEDLE BIOPSY COMPARISON:  Previous exam(s). PROCEDURE: During the pre-procedure ultrasound assessment, an additional irregular hypoechoic mass was noted at 11 o'clock 15 cm from the nipple, measuring 3 x 3 x 3 mm. This is favored to correspond with a component of the additional focal asymmetry in the upper inner left breast posterior depth, for which stereotactic guided biopsy had been recommended. Therefore, the decision was made to proceed with ultrasound-guided biopsy of this mass, with attention to post-procedure mammogram. Additionally, targeted left axillary ultrasound demonstrates morphologically benign-appearing lymph nodes. No lymphadenopathy is identified. I met with the patient and we discussed the procedure of ultrasound-guided biopsy, including benefits and alternatives. We discussed the high likelihood of a successful procedure. We discussed the risks of the procedure, including infection, bleeding, tissue injury, clip migration, and inadequate sampling.  Informed written consent was given. The usual time-out protocol was performed immediately prior to the procedure. SITE 1: Left breast mass in the 12 o'clock position 7 cm from the nipple (ribbon clip) Lesion quadrant: Upper inner quadrant Using sterile technique and 1% Lidocaine  as local anesthetic, under direct ultrasound visualization, a 14 gauge spring-loaded device was used to perform biopsy of a mass in the left breast 12 o'clock position 7 cm from the nipple using a lateral approach. At the conclusion of the procedure, a ribbon shaped tissue marker clip was deployed into the biopsy cavity. SITE 2: Left breast mass in the 11 o'clock position 15 cm from the nipple (coil clip) Lesion quadrant: Upper inner quadrant Using sterile technique and 1% Lidocaine  as local anesthetic, under direct ultrasound visualization, a 14 gauge spring-loaded device was used to perform biopsy of a mass in the left breast 11 o'clock position 15 cm from the nipple using a lateral approach. At the conclusion of the procedure, a coil shaped tissue marker clip was deployed into the biopsy cavity.  Follow up 2 view mammogram was performed and dictated separately. IMPRESSION: 1. Ultrasound guided biopsy of a mass in the left breast 12 o'clock position 7 cm from the nipple (ribbon clip). No apparent complications. 2. Ultrasound-guided biopsy of a mass in the left breast 11 o'clock position 15 cm from the nipple (coil clip). No apparent complications. 3. No left axillary lymphadenopathy noted on targeted left axillary ultrasound. Electronically Signed   By: Dirk Arrant M.D.   On: 03/17/2023 10:07   US  LT BREAST BX W LOC DEV EA ADD LESION IMG BX SPEC US  GUIDE Result Date: 03/17/2023 CLINICAL DATA:  68 year old female with suspicious mass in the left breast 12 o'clock position. Of note, on real-time ultrasound prior to the planned biopsy, an additional suspicious mass was noted at 11 o'clock 15 cm from the nipple. Therefore, a 2-site  ultrasound biopsy was performed. EXAM: ULTRASOUND GUIDED LEFT BREAST CORE NEEDLE BIOPSY COMPARISON:  Previous exam(s). PROCEDURE: During the pre-procedure ultrasound assessment, an additional irregular hypoechoic mass was noted at 11 o'clock 15 cm from the nipple, measuring 3 x 3 x 3 mm. This is favored to correspond with a component of the additional focal asymmetry in the upper inner left breast posterior depth, for which stereotactic guided biopsy had been recommended. Therefore, the decision was made to proceed with ultrasound-guided biopsy of this mass, with attention to post-procedure mammogram. Additionally, targeted left axillary ultrasound demonstrates morphologically benign-appearing lymph nodes. No lymphadenopathy is identified. I met with the patient and we discussed the procedure of ultrasound-guided biopsy, including benefits and alternatives. We discussed the high likelihood of a successful procedure. We discussed the risks of the procedure, including infection, bleeding, tissue injury, clip migration, and inadequate sampling. Informed written consent was given. The usual time-out protocol was performed immediately prior to the procedure. SITE 1: Left breast mass in the 12 o'clock position 7 cm from the nipple (ribbon clip) Lesion quadrant: Upper inner quadrant Using sterile technique and 1% Lidocaine  as local anesthetic, under direct ultrasound visualization, a 14 gauge spring-loaded device was used to perform biopsy of a mass in the left breast 12 o'clock position 7 cm from the nipple using a lateral approach. At the conclusion of the procedure, a ribbon shaped tissue marker clip was deployed into the biopsy cavity. SITE 2: Left breast mass in the 11 o'clock position 15 cm from the nipple (coil clip) Lesion quadrant: Upper inner quadrant Using sterile technique and 1% Lidocaine  as local anesthetic, under direct ultrasound visualization, a 14 gauge spring-loaded device was used to perform biopsy of a  mass in the left breast 11 o'clock position 15 cm from the nipple using a lateral approach. At the conclusion of the procedure, a coil shaped tissue marker clip was deployed into the biopsy cavity. Follow up 2 view mammogram was performed and dictated separately. IMPRESSION: 1. Ultrasound guided biopsy of a mass in the left breast 12 o'clock position 7 cm from the nipple (ribbon clip). No apparent complications. 2. Ultrasound-guided biopsy of a mass in the left breast 11 o'clock position 15 cm from the nipple (coil clip). No apparent complications. 3. No left axillary lymphadenopathy noted on targeted left axillary ultrasound. Electronically Signed   By: Dirk Arrant M.D.   On: 03/17/2023 10:07   MM 3D DIAGNOSTIC MAMMOGRAM UNILATERAL LEFT BREAST Result Date: 03/04/2023 CLINICAL DATA:  Screening recall for possible left breast distortion. EXAM: DIGITAL DIAGNOSTIC UNILATERAL LEFT MAMMOGRAM WITH TOMOSYNTHESIS AND CAD; ULTRASOUND LEFT BREAST LIMITED TECHNIQUE: Left digital diagnostic mammography and  breast tomosynthesis was performed. The images were evaluated with computer-aided detection. ; Targeted ultrasound examination of the left breast was performed. COMPARISON:  Previous exam(s). ACR Breast Density Category b: There are scattered areas of fibroglandular density. FINDINGS: MAMMOGRAM: Diagnostic views demonstrate a persistent area of architectural distortion in the upper slightly inner left breast middle depth (spot CC 2 of 3 image 24/49, spot MLO image 24/58, ML image 28/69). There are associated microcalcifications, which are not definitely stable compared to prior exams. This corresponds with the screening mammogram finding. Incidentally visualized additional focal asymmetry with associated subtle distortion and microcalcifications in the upper inner left breast posterior depth (ML image 31/69, spot ML 31/81, spot CC 3 of 3 image 25/53), which is new compared to prior mammograms. This is located  approximately 5.0 cm posterior to the area of distortion described above. Additionally, incidentally visualized 15 mm oval circumscribed mass in the upper-outer left breast middle depth (ML image 27/69). ULTRASOUND: On physical exam, there is a firm immobile subcentimeter mass in the upper inner left breast. Targeted left breast ultrasound was performed by the sonographer and the physician: At 12 o'clock 7 cm from the nipple, there is an irregular hypoechoic mass, with moderate internal vascularity. It measures 7 x 5 x 4 mm. This corresponds with the architectural distortion in the upper inner left breast middle depth. On real-time examination, no sonographic correlate for the additional focal asymmetry in the upper inner left breast posterior depth is identified. At 11 o'clock 8 cm from the nipple, there is an oval circumscribed anechoic mass, without internal vascularity. It measures 16 x 4 x 14 mm. This is consistent with a benign cyst and corresponds with the incidentally visualized mass seen in the upper-outer left breast middle depth. IMPRESSION: 1. Left breast 7 mm irregular hypoechoic mass in the 12 o'clock position is suspicious for malignancy. Recommend further assessment with ultrasound-guided biopsy. 2. Additional focal asymmetry with subtle distortion in the upper inner left breast posterior depth, located approximately 5.0 cm posterior to the mass, is also suspicious for malignancy. Recommend further assessment with stereotactic guided biopsy. 3. Incidentally visualized benign cyst in the left breast 11 o'clock position. RECOMMENDATION: 1. Left breast ultrasound-guided biopsy (1 site). 2. Left breast stereotactic guided biopsy (1 site). I have discussed the findings and recommendations with the patient. The biopsy procedure was discussed with the patient and questions were answered. Patient expressed their understanding of the biopsy recommendation. Patient will be scheduled for biopsy at her earliest  convenience by the schedulers. Ordering provider will be notified. If applicable, a reminder letter will be sent to the patient regarding the next appointment. BI-RADS CATEGORY  4: Suspicious. Electronically Signed   By: Dirk Arrant M.D.   On: 03/04/2023 16:58   US  LIMITED ULTRASOUND INCLUDING AXILLA LEFT BREAST  Result Date: 03/04/2023 CLINICAL DATA:  Screening recall for possible left breast distortion. EXAM: DIGITAL DIAGNOSTIC UNILATERAL LEFT MAMMOGRAM WITH TOMOSYNTHESIS AND CAD; ULTRASOUND LEFT BREAST LIMITED TECHNIQUE: Left digital diagnostic mammography and breast tomosynthesis was performed. The images were evaluated with computer-aided detection. ; Targeted ultrasound examination of the left breast was performed. COMPARISON:  Previous exam(s). ACR Breast Density Category b: There are scattered areas of fibroglandular density. FINDINGS: MAMMOGRAM: Diagnostic views demonstrate a persistent area of architectural distortion in the upper slightly inner left breast middle depth (spot CC 2 of 3 image 24/49, spot MLO image 24/58, ML image 28/69). There are associated microcalcifications, which are not definitely stable compared to prior exams. This corresponds  with the screening mammogram finding. Incidentally visualized additional focal asymmetry with associated subtle distortion and microcalcifications in the upper inner left breast posterior depth (ML image 31/69, spot ML 31/81, spot CC 3 of 3 image 25/53), which is new compared to prior mammograms. This is located approximately 5.0 cm posterior to the area of distortion described above. Additionally, incidentally visualized 15 mm oval circumscribed mass in the upper-outer left breast middle depth (ML image 27/69). ULTRASOUND: On physical exam, there is a firm immobile subcentimeter mass in the upper inner left breast. Targeted left breast ultrasound was performed by the sonographer and the physician: At 12 o'clock 7 cm from the nipple, there is an  irregular hypoechoic mass, with moderate internal vascularity. It measures 7 x 5 x 4 mm. This corresponds with the architectural distortion in the upper inner left breast middle depth. On real-time examination, no sonographic correlate for the additional focal asymmetry in the upper inner left breast posterior depth is identified. At 11 o'clock 8 cm from the nipple, there is an oval circumscribed anechoic mass, without internal vascularity. It measures 16 x 4 x 14 mm. This is consistent with a benign cyst and corresponds with the incidentally visualized mass seen in the upper-outer left breast middle depth. IMPRESSION: 1. Left breast 7 mm irregular hypoechoic mass in the 12 o'clock position is suspicious for malignancy. Recommend further assessment with ultrasound-guided biopsy. 2. Additional focal asymmetry with subtle distortion in the upper inner left breast posterior depth, located approximately 5.0 cm posterior to the mass, is also suspicious for malignancy. Recommend further assessment with stereotactic guided biopsy. 3. Incidentally visualized benign cyst in the left breast 11 o'clock position. RECOMMENDATION: 1. Left breast ultrasound-guided biopsy (1 site). 2. Left breast stereotactic guided biopsy (1 site). I have discussed the findings and recommendations with the patient. The biopsy procedure was discussed with the patient and questions were answered. Patient expressed their understanding of the biopsy recommendation. Patient will be scheduled for biopsy at her earliest convenience by the schedulers. Ordering provider will be notified. If applicable, a reminder letter will be sent to the patient regarding the next appointment. BI-RADS CATEGORY  4: Suspicious. Electronically Signed   By: Dirk Arrant M.D.   On: 03/04/2023 16:58

## 2023-03-26 NOTE — Progress Notes (Signed)
 Accompanied patient and family to initial medical oncology appointment.   Reviewed Breast Cancer treatment handbook.   Care plan summary given to patient.   Reviewed outreach programs and cancer center services.

## 2023-03-26 NOTE — Assessment & Plan Note (Addendum)
 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  Pathology and radiology counseling: Discussed with the patient, the details of pathology including the type of breast cancer,the clinical staging, the significance of ER, PR and HER-2/neu receptors and the implications for treatment. After reviewing the pathology in detail, we proceeded to discuss the different treatment options between surgery, radiation, chemotherapy, antiestrogen therapies.  Left axillary lymph node status was not reported.  Will contact radiology to clarify- addendum - left axillary LN negative.  Add ER/PR/HER2 profile on block 2   - addendum ER/PR +, HER2 negative.   Treatment plan: 1.  Breast conserving surgery with sentinel lymph node biopsy 2. Oncotype DX testing on the surgical specimen to determine if she would benefit from adjuvant chemo 3.  Adjuvant radiation 4.  Adjuvant antiestrogen therapy  Return to clinic based upon surgery pathology and Oncotype DX test result

## 2023-03-26 NOTE — Assessment & Plan Note (Signed)
Curative intent.

## 2023-03-26 NOTE — Assessment & Plan Note (Addendum)
 Microcytic anemia, history of iron  deficiency. Will add iron  TIBC ferritin  Addendum  Lab Results  Component Value Date   HGB 10.6 (L) 03/26/2023   TIBC 482 (H) 03/26/2023   IRONPCTSAT 5 (L) 03/26/2023   FERRITIN 14 03/26/2023    Recommend patient to take Vitron C 1 tab daily.

## 2023-03-26 NOTE — Progress Notes (Signed)
 Received message from Irving Shows, RN about drawing [employer].  TRF submitted to [facility] STAT panel with reflex to Common Hereditary Cancers +RNA Panel.

## 2023-03-26 NOTE — Assessment & Plan Note (Signed)
 Provoked by OCP use.  Currently not on anticoagulation.

## 2023-03-27 ENCOUNTER — Encounter: Payer: Self-pay | Admitting: *Deleted

## 2023-03-27 ENCOUNTER — Other Ambulatory Visit: Payer: Self-pay

## 2023-03-27 ENCOUNTER — Telehealth: Payer: Self-pay | Admitting: Genetic Counselor

## 2023-03-27 DIAGNOSIS — C50919 Malignant neoplasm of unspecified site of unspecified female breast: Secondary | ICD-10-CM

## 2023-03-27 LAB — IRON AND TIBC
Iron: 23 ug/dL — ABNORMAL LOW (ref 28–170)
Saturation Ratios: 5 % — ABNORMAL LOW (ref 10.4–31.8)
TIBC: 482 ug/dL — ABNORMAL HIGH (ref 250–450)
UIBC: 459 ug/dL

## 2023-03-27 LAB — CANCER ANTIGEN 27.29: CA 27.29: 12.9 U/mL (ref 0.0–38.6)

## 2023-03-27 LAB — FERRITIN: Ferritin: 14 ng/mL (ref 11–307)

## 2023-03-27 NOTE — Progress Notes (Signed)
 Called [facility] and faxed order to add prognostics ER/PR/HER2 to 2nd biopsy per Dr. Cathie Hoops.   [facility] said they would discuss with Dr. Loreli Dollar.

## 2023-03-27 NOTE — Telephone Encounter (Signed)
 Called patient in attempt to scheduled urgent genetics appt.  LVM requesting call back to schedule.

## 2023-03-28 LAB — CANCER ANTIGEN 15-3: CA 15-3: 10.9 U/mL (ref 0.0–25.0)

## 2023-03-31 MED ORDER — IRON-VITAMIN C 65-125 MG PO TABS: 1.0000 | ORAL_TABLET | Freq: Every day | ORAL | 2 refills | Status: AC

## 2023-03-31 NOTE — Addendum Note (Signed)
 Addended by: Rickard Patience on: 03/31/2023 09:43 PM   Modules accepted: Orders

## 2023-04-01 ENCOUNTER — Inpatient Hospital Stay: Payer: Medicare Other | Admitting: Genetic Counselor

## 2023-04-01 ENCOUNTER — Encounter: Payer: Self-pay | Admitting: Genetic Counselor

## 2023-04-01 ENCOUNTER — Telehealth: Payer: Self-pay

## 2023-04-01 DIAGNOSIS — Z800 Family history of malignant neoplasm of digestive organs: Secondary | ICD-10-CM | POA: Insufficient documentation

## 2023-04-01 DIAGNOSIS — Z8042 Family history of malignant neoplasm of prostate: Secondary | ICD-10-CM | POA: Insufficient documentation

## 2023-04-01 NOTE — Telephone Encounter (Signed)
-----   Message from Timmy Forbes sent at 03/31/2023  9:43 PM EST ----- Iron  level is low. Recommend pt to take vitron c 1 tab daily.

## 2023-04-01 NOTE — Telephone Encounter (Signed)
 Called and left message with patient that her iron  level is low and that Dr. Wilhelmenia Harada would like for her to take Vitron C 1 tab daily. Informed patient that Rx has been sent to her pharmacy.

## 2023-04-01 NOTE — Progress Notes (Signed)
 REFERRING PROVIDER: Timmy Forbes, MD [address] [place],  [place] [address]  PRIMARY PROVIDER:  Gabriel John, NP  PRIMARY REASON FOR VISIT:  1. Family history of prostate cancer   2. Family history of colon cancer      HISTORY OF PRESENT ILLNESS:  I connected with  Ms. Conly on 04/01/2023 at 1 PM EDT by MyChart video conference and verified that I am speaking with the correct person using two identifiers.   Patient location: Home Provider location: [facility]   Ms. Gutkin, a 68 y.o. female, was seen for a [facility] cancer genetics consultation at the request of Dr. Wilhelmenia Harada due to a personal and family history of cancer.  Ms. Staheli presents to clinic today to discuss the possibility of a hereditary predisposition to cancer, genetic testing, and to further clarify her future cancer risks, as well as potential cancer risks for family members.   In December 2024, at the age of 23, Ms. Simington was diagnosed with cancer of the left breast. The treatment plan will most likely include a lumpectomy.       CANCER HISTORY:  Oncology History  Invasive carcinoma of breast (HCC)  02/26/2023 Mammogram   Bilateral screening mammogram  In the left breast, possible distortion warrants further evaluation. In the right breast, no findings suspicious for malignancy.     03/04/2023 Mammogram   Bilateral diagnostic mammogram showed  1. Left breast 7 mm irregular hypoechoic mass in the 12 o'clock position is suspicious for malignancy. Recommend further assessment with ultrasound-guided biopsy. 2. Additional focal asymmetry with subtle distortion in the upper inner left breast posterior depth, located approximately 5.0 cm posterior to the mass, is also suspicious for malignancy. Recommend further assessment with stereotactic guided biopsy. 3. Incidentally visualized benign cyst in the left breast 11 o'clock position.   RECOMMENDATION: 1. Left breast ultrasound-guided biopsy (1  site). 2. Left breast stereotactic guided biopsy (1 site).   03/17/2023 Initial Diagnosis   Invasive carcinoma of breast [facility])  Patient underwent left breast biopsy 1. Breast, left, needle core biopsy, 12 o'clock, 7cmfn (ribbon clip) :      - INVASIVE DUCTAL CARCINOMA, SEE NOTE      - TUBULE FORMATION: SCORE 2      - NUCLEAR PLEOMORPHISM: SCORE 2      - MITOTIC COUNT: SCORE 1      - TOTAL SCORE: 5      - OVERALL GRADE: 1      - LYMPHOVASCULAR INVASION: NOT IDENTIFIED      - CANCER LENGTH: 5.5 MM      - CALCIFICATIONS: PRESENT       2. Breast, left, needle core biopsy, 11 o'clock, 15cmfn (coil clip) :      - INVASIVE DUCTAL CARCINOMA, SEE NOTE      - TUBULE FORMATION: SCORE 2      - NUCLEAR PLEOMORPHISM: SCORE 2      - MITOTIC COUNT: SCORE 1      - TOTAL SCORE: 5      - OVERALL GRADE: 1      - LYMPHOVASCULAR INVASION: NOT IDENTIFIED      - CANCER LENGTH: 4 MM      - CALCIFICATIONS: NOT IDENTIFIED       Diagnosis Note : - 2.Dr.Patrick reviewed the case and concurs with the      interpretations.Immunohistochemical staining for myoepithelial markers      (calponin, smooth muscle myosin and p63) is performed on blocks 1 and  2.The      myoepithelial markers are focally lost which is consistent with the above      diagnoses.       Block 1 ER 99% positive, PR 40% positive, HER2 IHC 2+, FISH negative.        Block 2 ER 90% positive, PR 70% positive, HER2 IHC 2+, FISH negative [result in image view]     03/26/2023 Cancer Staging   Staging form: Breast, AJCC 8th Edition - Clinical stage from 03/26/2023: Stage IA (cT1b, cN0, cM0, G1, ER+, PR+, HER2-) - Signed by Timmy Forbes, MD on 03/26/2023 Stage prefix: Initial diagnosis Histologic grading system: 3 grade system      RISK FACTORS:  Menarche was at age 44.  First live birth at age 76.   Ovaries intact: yes.  Hysterectomy: no.  Menopausal status: postmenopausal.  HRT use: 0 years. Colonoscopy: yes;  5 polyps . Mammogram within the  last year: yes. Number of breast biopsies: 1. Any excessive radiation exposure in the past: no  Past Medical History:  Diagnosis Date   Allergy    Bilateral swelling of feet and ankles    Family history of colon cancer    Family history of prostate cancer    Gallbladder polyp 2012   GERD (gastroesophageal reflux disease)    Hyperlipidemia    Hypertension    Iron  deficiency anemia    Menopausal symptoms    since 2007   Murmur, cardiac    Other fatigue    Prediabetes    Pulmonary embolism (HCC) 06/2020   Shortness of breath    with exertion; patient going to pulmonary rehabilitation   Shortness of breath on exertion    Sleep apnea    Uses CPAP   Vitamin D  deficiency     Past Surgical History:  Procedure Laterality Date   BREAST BIOPSY Left 03/17/2023   US  bx,Ribbon Clip, path pending   BREAST BIOPSY Left 03/17/2023   US  Bx, Coil Clip,path pending   BREAST BIOPSY Left 03/17/2023   US  LT BREAST BX W LOC DEV 1ST LESION IMG BX SPEC US  GUIDE 03/17/2023 [facility]   BREAST BIOPSY Left 03/17/2023   US  LT BREAST BX W LOC DEV EA ADD LESION IMG BX SPEC US  GUIDE 03/17/2023 [facility]   COLONOSCOPY  2010   2020   HYSTEROSCOPY WITH D & C N/A 07/27/2019   Procedure: DILATATION AND CURETTAGE /HYSTEROSCOPY, Polypectomy;  Surgeon: Julianne Octave, MD;  Location: [facility];  Service: Gynecology;  Laterality: N/A;   HYSTEROSCOPY WITH D & C N/A 06/26/2021   Procedure: DILATATION AND CURETTAGE /HYSTEROSCOPY;  Surgeon: Julianne Octave, MD;  Location: [facility];  Service: Gynecology;  Laterality: N/A;   INTRAUTERINE DEVICE (IUD) INSERTION N/A 06/26/2021   Procedure: INTRAUTERINE DEVICE (IUD) INSERTION;  Surgeon: Julianne Octave, MD;  Location: [facility];  Service: Gynecology;  Laterality: N/A;    Social History   Socioeconomic History   Marital status: Legally Separated    Spouse name: Not on file   Number of children: 1    Years of education: Not on file   Highest education level: Bachelor's degree (e.g., BA, AB, BS)  Occupational History   Occupation: Retired  Tobacco Use   Smoking status: Former    Current packs/day: 0.00    Average packs/day: 1 pack/day for 27.0 years (27.0 ttl pk-yrs)    Types: Cigarettes    Start date: 38    Quit date: 1999  Years since quitting: 26.0   Smokeless tobacco: Never  Vaping Use   Vaping status: Never Used  Substance and Sexual Activity   Alcohol use: Yes    Alcohol/week: 7.0 standard drinks of alcohol    Types: 7 Glasses of wine per week    Comment: 1-2 glasses of wine per night   Drug use: No   Sexual activity: Yes  Other Topics Concern   Not on file  Social History Narrative   Married.   1 child, 1 grandchild.   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   Enjoys going to [employer], singing.    Social Drivers of Health   Financial Resource Strain: High Risk (03/19/2023)   Overall Financial Resource Strain (CARDIA)    Difficulty of Paying Living Expenses: Hard  Food Insecurity: Food Insecurity Present (03/19/2023)   Hunger Vital Sign    Worried About Running Out of Food in the Last Year: Sometimes true    Ran Out of Food in the Last Year: Sometimes true  Transportation Needs: No Transportation Needs (03/19/2023)   PRAPARE - Administrator, Civil Service (Medical): No    Lack of Transportation (Non-Medical): No  Physical Activity: Inactive (03/19/2023)   Exercise Vital Sign    Days of Exercise per Week: 0 days    Minutes of Exercise per Session: 10 min  Stress: No Stress Concern Present (03/19/2023)   [employer] of Occupational Health - Occupational Stress Questionnaire    Feeling of Stress : Not at all  Social Connections: Moderately Integrated (03/19/2023)   Social Connection and Isolation Panel [NHANES]    Frequency of Communication with Friends and Family: More than three times a week    Frequency of  Social Gatherings with Friends and Family: Three times a week    Attends Religious Services: More than 4 times per year    Active Member of Clubs or Organizations: Yes    Attends Engineer, structural: More than 4 times per year    Marital Status: Separated     FAMILY HISTORY:  We obtained a detailed, 4-generation family history.  Significant diagnoses are listed below: Family History  Problem Relation Age of Onset   Depression Mother    Stroke Mother    Sudden death Mother    Heart disease Mother    Hypertension Mother    Heart attack Mother 52   Cancer Father    Prostate cancer Father        metastatic   Heart disease Father 51       CAD, STENT   Coronary artery disease Sister        stent x 2   Coronary artery disease Sister 104       stent placed   Hypertension Sister    Hypertension Sister    Heart disease Brother        Myocardial infarction   Colon cancer Maternal Aunt    Sudden death Other    Depression Other    Colon polyps Neg Hx    Esophageal cancer Neg Hx    Rectal cancer Neg Hx    Stomach cancer Neg Hx    Breast cancer Neg Hx      The patient has one son who is cancer free.  She has five sisters and a brother who are cancer free.  Both parents are deceased.  The patient's father died of metastatic prostate cancer at 59.  He had  eight siblings, the patient does not know if there was anyone with cancer.    The patient's mother died of a heart attack at 43.  She had a brother and sister, the sister had colon cancer.  There was no other reported family history of cancer.  Ms. Merisier is unaware of previous family history of genetic testing for hereditary cancer risks. There is no reported Ashkenazi Jewish ancestry. There is no known consanguinity.  GENETIC COUNSELING ASSESSMENT: Ms. Mckibbin is a 68 y.o. female with a personal and family history of cancer which is somewhat suggestive of a hereditary cancer syndrome and predisposition to cancer given  her diagnosis of breast cancer and her father's metastatic disease. We, therefore, discussed and recommended the following at today's visit.   DISCUSSION: We discussed that, in general, most cancer is not inherited in families, but instead is sporadic or familial. Sporadic cancers occur by chance and typically happen at older ages (>50 years) as this type of cancer is caused by genetic changes acquired during an individual's lifetime. Some families have more cancers than would be expected by chance; however, the ages or types of cancer are not consistent with a known genetic mutation or known genetic mutations have been ruled out. This type of familial cancer is thought to be due to a combination of multiple genetic, environmental, hormonal, and lifestyle factors. While this combination of factors likely increases the risk of cancer, the exact source of this risk is not currently identifiable or testable.  We discussed that 5 - 10% of breast cancer is hereditary, with most cases associated with BRCA mutations.  There are other genes that can be associated with hereditary breast cancer syndromes.  These include ATM, CHEK2 and PALB2.  We discussed that testing is beneficial for several reasons including knowing how to follow individuals after completing their treatment, identifying whether potential treatment options such as PARP inhibitors would be beneficial, and understand if other family members could be at risk for cancer and allow them to undergo genetic testing.   We reviewed the characteristics, features and inheritance patterns of hereditary cancer syndromes. We also discussed genetic testing, including the appropriate family members to test, the process of testing, insurance coverage and turn-around-time for results. We discussed the implications of a negative, positive and/or variant of uncertain significant result. In order to get genetic test results in a timely manner so that Ms. Demler can use  these genetic test results for surgical decisions, we recommended Ms. Ferrone pursue genetic testing for the STAT panel. Once complete, we recommend Ms. Mccune pursue reflex genetic testing to the Common Hereditary Cancer + RNA gene panel.   The Common Hereditary Gene Panel offered by [facility] includes sequencing and/or deletion duplication testing of the following 48 genes: APC, ATM, AXIN2, BAP1, BARD1, BMPR1A, BRCA1, BRCA2, BRIP1, CDH1, CDK4, CDKN2A (p14ARF), CDKN2A (p16INK4a), CHEK2, CTNNA1, DICER1, EPCAM (Deletion/duplication testing only), GREM1 (promoter region deletion/duplication testing only), KIT, MEN1, MLH1, MSH2, MSH3, MSH6, MUTYH, NBN, NF1, NHTL1, PALB2, PDGFRA, PMS2, POLD1, POLE, PTEN, RAD50, RAD51C, RAD51D, SDHB, SDHC, SDHD, SMAD4, SMARCA4. STK11, TP53, TSC1, TSC2, and VHL.  The following genes were evaluated for sequence changes only: SDHA and HOXB13 c.251G>A variant only.   Based on Ms. Urias's personal and family history of cancer, she meets medical criteria for genetic testing. Despite that she meets criteria, she may still have an out of pocket cost.   We discussed that some people do not want to undergo genetic testing due to fear of genetic  discrimination.  The Genetic Information Nondiscrimination Act (GINA) was signed into federal law in 2008. GINA prohibits health insurers and most employers from discriminating against individuals based on genetic information (including the results of genetic tests and family history information). According to GINA, health insurance companies cannot consider genetic information to be a preexisting condition, nor can they use it to make decisions regarding coverage or rates. GINA also makes it illegal for most employers to use genetic information in making decisions about hiring, firing, promotion, or terms of employment. It is important to note that GINA does not offer protections for life insurance, disability insurance, or long-term care  insurance. GINA does not apply to those in the [employer], those who work for companies with less than 15 employees, and new life insurance or long-term disability insurance policies.  Health status due to a cancer diagnosis is not protected under GINA. More information about GINA can be found by visiting [email or web address].  PLAN: After considering the risks, benefits, and limitations, Ms. Afolabi provided informed consent to pursue genetic testing and the blood sample was sent to [facility] for analysis of the Common Hereditary Cancer + RNA panel. Results should be available within approximately 2-3 weeks' time, at which point they will be disclosed by telephone to Ms. Snedden, as will any additional recommendations warranted by these results. Ms. Flash will receive a summary of her genetic counseling visit and a copy of her results once available. This information will also be available in Epic.   Lastly, we encouraged Ms. Mclucas to remain in contact with cancer genetics annually so that we can continuously update the family history and inform her of any changes in cancer genetics and testing that may be of benefit for this family.   Ms. Yasin's questions were answered to her satisfaction today. Our contact information was provided should additional questions or concerns arise. Thank you for the referral and allowing us  to share in the care of your patient.   Beatrice Sehgal P. [place], MS, CGC Licensed, Patent attorney [email or web address] phone: [phone]  46 minutes were spent on the date of the encounter in service to the patient including preparation, face-to-face consultation, documentation and care coordination.  The patient was seen alone.  Drs. Johnna Nakai, and/or Gudena were available for questions, if needed..    _______________________________________________________________________ For Office Staff:  Number of people involved in session: 1 Was  an Intern/ student involved with case: no

## 2023-04-03 ENCOUNTER — Telehealth: Payer: Self-pay | Admitting: Genetic Counselor

## 2023-04-03 ENCOUNTER — Encounter: Payer: Self-pay | Admitting: Genetic Counselor

## 2023-04-03 DIAGNOSIS — Z1379 Encounter for other screening for genetic and chromosomal anomalies: Secondary | ICD-10-CM | POA: Insufficient documentation

## 2023-04-03 NOTE — Telephone Encounter (Signed)
LM on VM that results were back and to please call.  Left CB instructions. 

## 2023-04-06 ENCOUNTER — Telehealth: Payer: Self-pay | Admitting: Genetic Counselor

## 2023-04-06 NOTE — Telephone Encounter (Signed)
LM on VM that results were normal.  I will upload a copy of the result to her patient portal and she can call if there are questions.  I will follow up when the remainder of testing is back.

## 2023-04-07 ENCOUNTER — Other Ambulatory Visit: Payer: Self-pay | Admitting: General Surgery

## 2023-04-07 ENCOUNTER — Telehealth: Payer: Self-pay | Admitting: Radiation Oncology

## 2023-04-07 DIAGNOSIS — C50212 Malignant neoplasm of upper-inner quadrant of left female breast: Secondary | ICD-10-CM

## 2023-04-07 DIAGNOSIS — Z171 Estrogen receptor negative status [ER-]: Secondary | ICD-10-CM

## 2023-04-07 NOTE — Telephone Encounter (Signed)
Called patient to schedule a consultation w. Dr, Mitzi Hansen. No answer, LVM for a return call.

## 2023-04-08 ENCOUNTER — Telehealth: Payer: Self-pay | Admitting: *Deleted

## 2023-04-08 ENCOUNTER — Ambulatory Visit: Payer: Medicare Other | Attending: Oncology | Admitting: Occupational Therapy

## 2023-04-08 ENCOUNTER — Encounter: Payer: Self-pay | Admitting: Occupational Therapy

## 2023-04-08 DIAGNOSIS — R293 Abnormal posture: Secondary | ICD-10-CM | POA: Insufficient documentation

## 2023-04-08 NOTE — Telephone Encounter (Signed)
The patient called today and said that Dr. Cathie Hoops had sent a medicine for her and when she went to [facility] they said that they do not have new Rx for her.  I looked into the computer with the prescriptions and she did send it in but it is an over-the-counter medicine, I told her that it is Vitron-C tablet every day.  She can also do the and generic iron-vitamin C with 65-125 mg and you take it once a day.  Also if she was to go to [place] and just talk to a pharmacist they would be able to see it in the system and they can help her so that they would get the right one the pharmacist can help her.  She is okay with that and she will go down there and work on that

## 2023-04-08 NOTE — Therapy (Signed)
OUTPATIENT OCCUPATIONAL THERAPY BREAST CANCER BASELINE EVALUATION   Patient Name: Paige Hester MRN: [id number] DOB:Jun 15, 1955, 68 y.o., female Today's Date: 04/08/2023  END OF SESSION:  OT End of Session - 04/08/23 1754     Visit Number 1    Number of Visits 4    Date for OT Re-Evaluation 07/02/23    OT Start Time 1500    OT Stop Time 1531    OT Time Calculation (min) 31 min    Activity Tolerance Patient tolerated treatment well    Behavior During Therapy [facility] for tasks assessed/performed             Past Medical History:  Diagnosis Date   Allergy    Bilateral swelling of feet and ankles    Family history of colon cancer    Family history of prostate cancer    Gallbladder polyp 2012   GERD (gastroesophageal reflux disease)    Hyperlipidemia    Hypertension    Iron deficiency anemia    Menopausal symptoms    since 2007   Murmur, cardiac    Other fatigue    Prediabetes    Pulmonary embolism (HCC) 06/2020   Shortness of breath    with exertion; patient going to pulmonary rehabilitation   Shortness of breath on exertion    Sleep apnea    Uses CPAP   Vitamin D deficiency    Past Surgical History:  Procedure Laterality Date   BREAST BIOPSY Left 03/17/2023   [place] bx,Ribbon Clip, path pending   BREAST BIOPSY Left 03/17/2023   [place] Bx, Coil Clip,path pending   BREAST BIOPSY Left 03/17/2023   [place] LT BREAST BX W LOC DEV 1ST LESION IMG BX SPEC US GUIDE 03/17/2023 [facility]   BREAST BIOPSY Left 03/17/2023   [place] LT BREAST BX W LOC DEV EA ADD LESION IMG BX SPEC US GUIDE 03/17/2023 [facility]   COLONOSCOPY  2010   2020   HYSTEROSCOPY WITH D & C N/A 07/27/2019   Procedure: DILATATION AND CURETTAGE /HYSTEROSCOPY, Polypectomy;  Surgeon: Tereso Newcomer, MD;  Location: [facility];  Service: Gynecology;  Laterality: N/A;   HYSTEROSCOPY WITH D & C N/A 06/26/2021   Procedure: DILATATION AND CURETTAGE /HYSTEROSCOPY;  Surgeon: Tereso Newcomer,  MD;  Location: [facility];  Service: Gynecology;  Laterality: N/A;   INTRAUTERINE DEVICE (IUD) INSERTION N/A 06/26/2021   Procedure: INTRAUTERINE DEVICE (IUD) INSERTION;  Surgeon: Tereso Newcomer, MD;  Location: [facility];  Service: Gynecology;  Laterality: N/A;   Patient Active Problem List   Diagnosis Date Noted   Genetic testing 04/03/2023   Family history of prostate cancer    Family history of colon cancer    Invasive carcinoma of breast (HCC) 03/26/2023   Family history of cancer 03/26/2023   Goals of care, counseling/discussion 03/26/2023   History of pediculosis 03/26/2023   Bilateral carotid artery stenosis 11/18/2022   Carpal tunnel syndrome of right wrist 01/31/2022   Bilateral hand pain 01/31/2022   Thickened endometrium    OSA (obstructive sleep apnea) 02/12/2021   Pulmonary hypertension (HCC) 07/19/2020   Aortic atherosclerosis (HCC) 07/19/2020   Hyperlipidemia 07/19/2020   History of pulmonary embolism 07/11/2020   Cardiomegaly 07/11/2020   Endometrial thickness of 18 mm and associated bleeding in postmenopausal patient 07/27/2019   Mild dysplasia of cervix (CIN I) 10/12/2018   Prediabetes 11/04/2016   Preventative health care 08/30/2014   Morbid obesity (HCC) 08/10/2013   GERD (gastroesophageal reflux disease)  05/31/2012   Hypokalemia 01/17/2011   MURMUR, CARDIAC, UNDIAGNOSED 09/20/2008   Iron deficiency anemia 08/28/2008   Essential hypertension 12/19/2006    PCP: Chestine Spore NP  REFERRING PROVIDER: Dr Cathie Hoops  REFERRING DIAG: L breast Cancer  THERAPY DIAG:  Abnormal posture  Rationale for Evaluation and Treatment: Rehabilitation  ONSET DATE: 03/26/23  SUBJECTIVE:                                                                                                                                                                                           SUBJECTIVE STATEMENT: Patient reports she is here today after being refer by one  of her medical team for her newly diagnosed left breast cancer.   PERTINENT HISTORY:  Patient was diagnosed with left  breast cancer - plan is to have L lumpectomy -- await results of MRI tomorrow - will than be schedule.   PATIENT GOALS:   reduce lymphedema risk and learn post op HEP.   PAIN:  Are you having pain? No  PRECAUTIONS: Active CA      HAND DOMINANCE: right  WEIGHT BEARING RESTRICTIONS: No  FALLS:  Has patient fallen in last 6 months? No  LIVING ENVIRONMENT: Patient lives with: Live alone   OCCUPATION and LEISURE: pt is retired Consulting civil engineer -and likes to be at home - reading and cross word puzzle -and travel to visit her son and family 4 x year in [place]   OBJECTIVE:  COGNITION: Overall cognitive status: Within functional limits for tasks assessed    POSTURE:  Forward head and rounded shoulders posture- pt had shot in neck about year ago - and doing well pain and ROM wise  UPPER EXTREMITY AROM/PROM: Bilateral shoulder AROM WNL    UPPER EXTREMITY STRENGTH: 5/5 in all planes for shoulder in all planes   LYMPHEDEMA ASSESSMENTS:   LANDMARK RIGHT   eval  10 cm proximal to olecranon process 41  Olecranon process 30.5  10 cm proximal to ulnar styloid process 26  Just proximal to ulnar styloid process 17  Across hand at thumb web space   At base of 2nd digit   (Blank rows = not tested)  LANDMARK LEFT   eval  10 cm proximal to olecranon process 42  Olecranon process 31  10 cm proximal to ulnar styloid process 24.5  Just proximal to ulnar styloid process 17  Across hand at thumb web space   At base of 2nd digit   (Blank rows = not tested)  L-DEX LYMPHEDEMA SCREENING:  The patient was assessed using the L-Dex machine today to produce a lymphedema index baseline score. The patient will be  reassessed on a regular basis (typically every 3 months) to obtain new L-Dex scores. If the score is > 6.5 points away from his/her baseline score indicating onset of  subclinical lymphedema, it will be recommended to wear a compression garment for 4 weeks, 12 hours per day and then be reassessed. If the score continues to be > 6.5 points from baseline at reassessment, we will initiate lymphedema treatment. Assessing in this manner has a 95% rate of preventing clinically significant lymphedema.   L-DEX FLOWSHEETS - 04/08/23 1800       L-DEX LYMPHEDEMA SCREENING   Measurement Type Unilateral    L-DEX MEASUREMENT EXTREMITY Upper Extremity    POSITION  Standing    DOMINANT SIDE Right    At Risk Side Left    BASELINE SCORE (UNILATERAL) 4.4              PATIENT EDUCATION:  Education details: Lymphedema risk reduction and post op shoulder/posture HEP Person educated: Patient Education method: Explanation, Demonstration, Handout Education comprehension: Patient verbalized understanding and returned demonstration  HOME EXERCISE PROGRAM: Patient was instructed today in a home exercise program today for post op shoulder range of motion. These included active assist shoulder flexion in standing/supine, scapular retraction, wall walking/slides with shoulder abduction, and hands behind head external rotation in supine.  She was encouraged to do these 2-3 x day, holding 3 seconds and repeating 10 times when permitted by her physician/surgeon.   ASSESSMENT:  CLINICAL IMPRESSION: Her multidisciplinary medical team has met to assess and determine a recommended treatment plan. She is planning to have L Lumpectomy and radiation - await results from MRI tomorrow. She will benefit from a post op OT reassessment to determine needs and from L-Dex screens every 3 months for 2 years to detect subclinical lymphedema. She done not exercise last couple of years - done around 2022 cardiac or pulmonary rehab and interested in CARE program in the near future. Will request referral from her MD when appropriate in future.   Pt will benefit from skilled therapeutic intervention  to improve on the following deficits: Decreased knowledge of precautions and lymphedema education, impaired UE functional use, pain, decreased ROM, postural dysfunction.   OT treatment/interventions: ADL/self-care home management, pt/family education, therapeutic exercise,manual therapy  REHAB POTENTIAL: Good  CLINICAL DECISION MAKING: Stable/uncomplicated  EVALUATION COMPLEXITY: Low   GOALS: Goals reviewed with patient? YES  LONG TERM GOALS: (STG=LTG)    Name Target Date Goal status  1 Pt will be able to verbalize understanding of pertinent lymphedema risk reduction practices relevant to her dx specifically related to skin care.  Baseline:  No knowledge 12 wks Initiated   2 Pt will be able to return demo and/or verbalize understanding of the post op HEP related to regaining shoulder ROM. Baseline:  No knowledge Today Achieved at eval       4 Pt will demo she has regained full shoulder ROM and function post operatively compared to baselines.  Baseline: See objective measurements taken today. 12 wks Initial    PLAN:  OT FREQUENCY/DURATION: EVAL and 3 follow up appointment as needed  PLAN FOR NEXT SESSION: will reassess 3-4 weeks post op to determine needs.   Patient will follow up at outpatient cancer rehab 3-4 weeks following surgery.  If the patient requires occupational therapy at that time, a specific plan will be dictated and sent to the referring physician for approval. T Occupational Therapy Information for After Breast Cancer Surgery/Treatment:  Lymphedema is a swelling condition that you  may be at risk for in your arm if you have lymph nodes removed from the armpit area.  After a sentinel node biopsy, the risk is approximately 5-9% and is higher after an axillary node dissection.  There is treatment available for this condition and it is not life-threatening.  Contact your physician or occupational therapist with concerns. You may begin the 4 shoulder/posture exercises  (see additional sheet) when permitted by your physician (typically a week after surgery).  If you have drains, you may need to wait until those are removed before beginning range of motion exercises.  A general recommendation is to not lift your arms above shoulder height until drains are removed.  These exercises should be done to your tolerance and gently.  This is not a "no pain/no gain" type of recovery so listen to your body and stretch into the range of motion that you can tolerate, stopping if you have pain.  If you are having immediate reconstruction, ask your plastic surgeon about doing exercises as he or she may want you to wait. .  While undergoing any medical procedure or treatment, try to avoid blood pressure being taken or needle sticks from occurring on the arm on the side of cancer.   This recommendation begins after surgery and continues for the rest of your life.  This may help reduce your risk of getting lymphedema (swelling in your arm). An excellent resource for those seeking information on lymphedema is the National Lymphedema Network's web site. It can be accessed at www.lymphnet.org If you notice swelling in your hand, arm or breast at any time following surgery (even if it is many years from now), please contact your doctor or occupational therapist to discuss this.  Lymphedema can be treated at any time but it is easier for you if it is treated early on.  If you feel like your shoulder motion is not returning to normal in a reasonable amount of time, please contact your surgeon or occupational therapist.  [facility] [phone]. [address], [place], [place] [address]  Patient was instructed today in a home exercise program today for post op shoulder range of motion. These included active assist shoulder flexion in standing/supine, scapular retraction, wall walking/slides with shoulder abduction, and hands behind head external rotation in supine.   She was encouraged to do these 2-3 x day, holding 3 seconds and repeating 10 times when permitted by her physician/surgeon      Oletta Cohn, OTR/L,CLT 04/08/2023, 6:02 PM

## 2023-04-09 ENCOUNTER — Other Ambulatory Visit: Payer: Self-pay | Admitting: General Surgery

## 2023-04-09 ENCOUNTER — Telehealth: Payer: Self-pay

## 2023-04-09 ENCOUNTER — Ambulatory Visit
Admission: RE | Admit: 2023-04-09 | Discharge: 2023-04-09 | Disposition: A | Discharge status: Home / Self Care | Payer: Medicare Other | Source: Ambulatory Visit | Attending: General Surgery | Admitting: General Surgery

## 2023-04-09 DIAGNOSIS — R928 Other abnormal and inconclusive findings on diagnostic imaging of breast: Secondary | ICD-10-CM

## 2023-04-09 DIAGNOSIS — C50212 Malignant neoplasm of upper-inner quadrant of left female breast: Secondary | ICD-10-CM

## 2023-04-09 MED ORDER — GADOPICLENOL 0.5 MMOL/ML IV SOLN
10.0000 mL | Freq: Once | INTRAVENOUS | Status: AC | PRN
  Administered 2023-04-09: 10 mL via INTRAVENOUS

## 2023-04-09 NOTE — Telephone Encounter (Signed)
Please call patient:  Notify patient that I reviewed the recent MRI.  I am sorry that she is going through all of this and that we are here to help however we can.  She is in good hands with the surgical oncology team.

## 2023-04-09 NOTE — Telephone Encounter (Signed)
Copied from CRM [id number]. Topic: General - Other >> Apr 09, 2023  2:22 PM Adele Barthel wrote: Reason for CRM: Patient called in to verify if provider has access to her records. She would like provider to look at most recent imaging from today. She had an MR BREAST BILAT WWO CONTRAS-GI at [facility], with 2 additional findings that will require biopsy.

## 2023-04-10 ENCOUNTER — Ambulatory Visit: Payer: Medicare Other | Admitting: Radiation Oncology

## 2023-04-10 ENCOUNTER — Ambulatory Visit: Payer: Medicare Other

## 2023-04-10 NOTE — Telephone Encounter (Signed)
Unable to reach patient. Left voicemail to return call to our office.

## 2023-04-10 NOTE — Telephone Encounter (Signed)
Patient called back in, reviewed all information.

## 2023-04-13 ENCOUNTER — Ambulatory Visit: Payer: Self-pay | Admitting: Genetic Counselor

## 2023-04-13 DIAGNOSIS — Z1379 Encounter for other screening for genetic and chromosomal anomalies: Secondary | ICD-10-CM

## 2023-04-13 NOTE — Progress Notes (Signed)
New Breast Cancer Diagnosis: Left Breast  Biopsy 04/16/2023   Histology per Pathology Report: grade 1, Invasive Ductal Carcinoma  Receptor Status: ER(positive), PR (positive), Her2-neu (negative), Ki-(%)   Surgeon and surgical plan, if any:  Dr. Donell Beers -Lumpectomy x2 vs bracketed- Unscheduled at this time. -Genetics- done, negative -Referral to PT.   Medical oncologist, treatment if any:   Dr. Cathie Hoops 03/26/2023 Treatment plan: 1.  Breast conserving surgery with sentinel lymph node biopsy 2. Oncotype DX testing on the surgical specimen to determine if she would benefit from adjuvant chemo 3.  Adjuvant radiation 4.  Adjuvant antiestrogen therapy   Family History of Breast/Ovarian/Prostate Cancer: Dad had prostate cancer.  Lymphedema issues, if any:  No    Pain issues, if any: none     SAFETY ISSUES: Prior radiation? No Pacemaker/ICD? No Possible current pregnancy? Postmenopausal Is the patient on methotrexate? No  Current Complaints / other details:

## 2023-04-13 NOTE — Progress Notes (Signed)
HPI:  Ms. Mowrer was previously seen in the [facility] due to a personal and family history of cancer and concerns regarding a hereditary predisposition to cancer. Please refer to our prior cancer genetics clinic note for more information regarding our discussion, assessment and recommendations, at the time. Ms. Klipfel's recent genetic test results were disclosed to her, as were recommendations warranted by these results. These results and recommendations are discussed in more detail below.  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 In the right breast, no findings suspicious for malignancy.     03/04/2023 Mammogram   Bilateral diagnostic mammogram showed  1. Left breast 7 mm irregular hypoechoic mass in the 12 o'clock position is suspicious for malignancy. Recommend further assessment with ultrasound-guided biopsy. 2. Additional focal asymmetry with subtle distortion in the upper inner left breast posterior depth, located approximately 5.0 cm posterior to the mass, is also suspicious for malignancy. Recommend further assessment with stereotactic guided biopsy. 3. Incidentally visualized benign cyst in the left breast 11 o'clock position.   RECOMMENDATION: 1. Left breast ultrasound-guided biopsy (1 site). 2. Left breast stereotactic guided biopsy (1 site).   03/17/2023 Initial Diagnosis   Invasive carcinoma of breast [facility])  Patient underwent left breast biopsy 1. Breast, left, needle core biopsy, 12 o'clock, 7cmfn (ribbon clip) :      - INVASIVE DUCTAL CARCINOMA, SEE NOTE      - TUBULE FORMATION: SCORE 2      - NUCLEAR PLEOMORPHISM: SCORE 2      - MITOTIC COUNT: SCORE 1      - TOTAL SCORE: 5      - OVERALL GRADE: 1      - LYMPHOVASCULAR INVASION: NOT  IDENTIFIED      - CANCER LENGTH: 5.5 MM      - CALCIFICATIONS: PRESENT       2. Breast, left, needle core biopsy, 11 o'clock, 15cmfn (coil clip) :      - INVASIVE DUCTAL CARCINOMA, SEE NOTE      - TUBULE FORMATION: SCORE 2      - NUCLEAR PLEOMORPHISM: SCORE 2      - MITOTIC COUNT: SCORE 1      - TOTAL SCORE: 5      - OVERALL GRADE: 1      - LYMPHOVASCULAR INVASION: NOT IDENTIFIED      - CANCER LENGTH: 4 MM      - CALCIFICATIONS: NOT IDENTIFIED       Diagnosis Note : - 2.Dr.Patrick reviewed the case and concurs with the      interpretations.Immunohistochemical staining for myoepithelial markers      (calponin, smooth muscle myosin and p63) is performed on blocks 1 and 2.The      myoepithelial markers are focally lost which is consistent with the above      diagnoses.       Block 1 ER 99% positive, PR 40% positive, HER2 IHC 2+, FISH negative.        Block 2 ER 90% positive, PR 70% positive, HER2 IHC 2+, FISH negative [result in image view]     03/26/2023 Cancer Staging   Staging form: Breast, AJCC 8th Edition - Clinical stage from 03/26/2023: Stage IA (cT1b, cN0, cM0, G1, ER+, PR+, HER2-) - Signed by Rickard Patience, MD on 03/26/2023 Stage prefix: Initial  diagnosis Histologic grading system: 3 grade system   04/03/2023 Genetic Testing   Negative genetic testing on the BRCA STAT Panel through [facility]. The Common Hereditary Cancer + RNA panel was also negative. The report dates are 04/03/2023 and April 08, 2023  The STAT Breast cancer panel offered by [facility] includes sequencing and rearrangement analysis for the following 9 genes:  ATM, BRCA1, BRCA2, CDH1, CHEK2, PALB2, PTEN, STK11 and TP53.   The Common Hereditary Gene Panel offered by [facility] includes sequencing and/or deletion duplication testing of the following 48 genes: APC, ATM, AXIN2, BAP1, BARD1, BMPR1A, BRCA1, BRCA2, BRIP1, CDH1, CDK4, CDKN2A (p14ARF), CDKN2A (p16INK4a), CHEK2, CTNNA1, DICER1, EPCAM (Deletion/duplication testing only),  GREM1 (promoter region deletion/duplication testing only), KIT, MEN1, MLH1, MSH2, MSH3, MSH6, MUTYH, NBN, NF1, NHTL1, PALB2, PDGFRA, PMS2, POLD1, POLE, PTEN, RAD50, RAD51C, RAD51D, SDHB, SDHC, SDHD, SMAD4, SMARCA4. STK11, TP53, TSC1, TSC2, and VHL.  The following genes were evaluated for sequence changes only: SDHA and HOXB13 c.251G>A variant only.       FAMILY HISTORY:  We obtained a detailed, 4-generation family history.  Significant diagnoses are listed below: Family History  Problem Relation Age of Onset   Depression Mother    Stroke Mother    Sudden death Mother    Heart disease Mother    Hypertension Mother    Heart attack Mother 48   Cancer Father    Prostate cancer Father        metastatic   Heart disease Father 86       CAD, STENT   Coronary artery disease Sister        stent x 2   Coronary artery disease Sister 31       stent placed   Hypertension Sister    Hypertension Sister    Heart disease Brother        Myocardial infarction   Colon cancer Maternal Aunt    Sudden death Other    Depression Other    Colon polyps Neg Hx    Esophageal cancer Neg Hx    Rectal cancer Neg Hx    Stomach cancer Neg Hx    Breast cancer Neg Hx        The patient has one son who is cancer free.  She has five sisters and a brother who are cancer free.  Both parents are deceased.   The patient's father died of metastatic prostate cancer at 49.  He had eight siblings, the patient does not know if there was anyone with cancer.     The patient's mother died of a heart attack at 19.  She had a brother and sister, the sister had colon cancer.  There was no other reported family history of cancer.   Ms. Liss is unaware of previous family history of genetic testing for hereditary cancer risks. There is no reported Ashkenazi Jewish ancestry. There is no known consanguinity.  GENETIC TEST RESULTS: Genetic testing reported out on April 08, 2023 through the Common Hereditary cancer  panel+RNA found no pathogenic mutations. The Common Hereditary Gene Panel offered by [facility] includes sequencing and/or deletion duplication testing of the following 48 genes: APC, ATM, AXIN2, BAP1, BARD1, BMPR1A, BRCA1, BRCA2, BRIP1, CDH1, CDK4, CDKN2A (p14ARF), CDKN2A (p16INK4a), CHEK2, CTNNA1, DICER1, EPCAM (Deletion/duplication testing only), GREM1 (promoter region deletion/duplication testing only), KIT, MEN1, MLH1, MSH2, MSH3, MSH6, MUTYH, NBN, NF1, NHTL1, PALB2, PDGFRA, PMS2, POLD1, POLE, PTEN, RAD50, RAD51C, RAD51D, SDHB, SDHC, SDHD, SMAD4, SMARCA4. STK11, TP53, TSC1, TSC2, and VHL.  The following  genes were evaluated for sequence changes only: SDHA and HOXB13 c.251G>A variant only. The test report has been scanned into EPIC and is located under the Molecular Pathology section of the Results Review tab.  A portion of the result report is included below for reference.     We discussed with Ms. Manalang that because current genetic testing is not perfect, it is possible there may be a gene mutation in one of these genes that current testing cannot detect, but that chance is small.  We also discussed, that there could be another gene that has not yet been discovered, or that we have not yet tested, that is responsible for the cancer diagnoses in the family. It is also possible there is a hereditary cause for the cancer in the family that Ms. Bankhead did not inherit and therefore was not identified in her testing.  Therefore, it is important to remain in touch with cancer genetics in the future so that we can continue to offer Ms. Mattox the most up to date genetic testing.   ADDITIONAL GENETIC TESTING: We discussed with Ms. Norby that there are other genes that are associated with increased cancer risk that can be analyzed. Should Ms. Bomkamp wish to pursue additional genetic testing, we are happy to discuss and coordinate this testing, at any time.    CANCER SCREENING RECOMMENDATIONS: Ms.  Harbour's test result is considered negative (normal).  This means that we have not identified a hereditary cause for her personal and family history of cancer at this time. Most cancers happen by chance and this negative test suggests that her personal and family history of cancer may fall into this category.    Possible reasons for Ms. Engh's negative genetic test include:  1. There may be a gene mutation in one of these genes that current testing methods cannot detect but that chance is small.  2. There could be another gene that has not yet been discovered, or that we have not yet tested, that is responsible for the cancer diagnoses in the family.  3.  There may be no hereditary risk for cancer in the family. The cancers in Ms. Molstad and/or her family may be sporadic/familial or due to other genetic and environmental factors. 4. It is also possible there is a hereditary cause for the cancer in the family that Ms. Serres did not inherit.  Therefore, it is recommended she continue to follow the cancer management and screening guidelines provided by her oncology and primary healthcare provider. An individual's cancer risk and medical management are not determined by genetic test results alone. Overall cancer risk assessment incorporates additional factors, including personal medical history, family history, and any available genetic information that may result in a personalized plan for cancer prevention and surveillance  RECOMMENDATIONS FOR FAMILY MEMBERS:   Since she did not inherit a identifiable mutation in a cancer predisposition gene included on this panel, her children could not have inherited a known mutation from her in one of these genes. Individuals in this family might be at some increased risk of developing cancer, over the general population risk, simply due to the family history of cancer.  We recommended women in this family have a yearly mammogram beginning at age 18, or  47 years younger than the earliest onset of cancer, an annual clinical breast exam, and perform monthly breast self-exams. Women in this family should also have a gynecological exam as recommended by their primary provider. All family members should be referred  for colonoscopy starting at age 32, or 75 years younger than the earliest onset of cancer.  FOLLOW-UP: Lastly, we discussed with Ms. Didio that cancer genetics is a rapidly advancing field and it is possible that new genetic tests will be appropriate for her and/or her family members in the future. We encouraged her to remain in contact with cancer genetics on an annual basis so we can update her personal and family histories and let her know of advances in cancer genetics that may benefit this family.   Our contact number was provided. Ms. Mazzoni's questions were answered to her satisfaction, and she knows she is welcome to call us at anytime with additional questions or concerns.   Maylon Cos, MS, [facility] Licensed, Certified Genetic Counselor [email or web address]

## 2023-04-13 NOTE — Progress Notes (Signed)
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   HISTORY OF PRESENT ILLNESS: Paige Hester is a 68 y.o. female seen at the request of Dr. Donell Beers for a new diagnosis of left breast cancer.  The patient presented in December 2024 with screening mammogram showing a possible distortion in the left breast.  Further workup on 03/04/2023 showed persistence of the mass in the left breast and a focal asymmetry with subtle distortion and microcalcifications in the upper inner left breast.  There was an additional 15 mm mass in the upper outer left breast.  By ultrasound at 12:00 there was a 7 mm mass corresponding to the architectural distortion on prior mammogram, at 11:00 a 16 mm mass consistent with a benign cyst and consistent with the incidentally visualized mass on diagnostic mammogram.  Posterior to the mass by 5 cm there was additional focal asymmetry.  2 biopsies were recommended.  The axillary nodes were not mentioned in the report.  She underwent biopsies on 03/17/2023.  The 12:00 biopsy showed a grade 1 invasive ductal carcinoma, and the 11:00 biopsy showed grade 1 invasive ductal carcinoma.  The 12:00 biopsy showed that the tumor was ER/PR positive, HER2 negative, and the 11:00 specimen was ER/PR positive HER2 negative as well.  She met with Dr. Donell Beers and plans to undergo lumpectomy with sentinel lymph node biopsy.  She met with Dr. Cathie Hoops who recommends Oncotype DX.  She is seen today to discuss adjuvant radiation at the appropriate time.    PREVIOUS RADIATION THERAPY: {EXAM; YES/NO:19492::"No"}   PAST MEDICAL HISTORY:  Past Medical History:  Diagnosis Date   Allergy     Bilateral swelling of feet and ankles    Family history of colon cancer    Family history of prostate cancer    Gallbladder polyp 2012   GERD (gastroesophageal reflux disease)    Hyperlipidemia    Hypertension    Iron deficiency anemia    Menopausal symptoms    since 2007   Murmur, cardiac    Other fatigue    Prediabetes    Pulmonary embolism (HCC) 06/2020   Shortness of breath    with exertion; patient going to pulmonary rehabilitation   Shortness of breath on exertion    Sleep apnea    Uses CPAP   Vitamin D deficiency        PAST SURGICAL HISTORY: Past Surgical History:  Procedure Laterality Date   BREAST BIOPSY Left 03/17/2023   [place] bx,Ribbon Clip, path pending   BREAST BIOPSY Left 03/17/2023   [place] Bx, Coil Clip,path pending   BREAST BIOPSY Left 03/17/2023   [place] LT BREAST BX W LOC DEV 1ST LESION IMG BX SPEC US GUIDE 03/17/2023 [facility]   BREAST BIOPSY Left 03/17/2023   [place] LT BREAST BX W LOC DEV EA ADD LESION IMG BX SPEC US GUIDE 03/17/2023 [facility]   COLONOSCOPY  2010   2020   HYSTEROSCOPY WITH D & C N/A 07/27/2019   Procedure: DILATATION AND CURETTAGE /HYSTEROSCOPY, Polypectomy;  Surgeon: Tereso Newcomer, MD;  Location: [facility];  Service: Gynecology;  Laterality: N/A;   HYSTEROSCOPY WITH D &  C N/A 06/26/2021   Procedure: DILATATION AND CURETTAGE /HYSTEROSCOPY;  Surgeon: Tereso Newcomer, MD;  Location: [facility];  Service: Gynecology;  Laterality: N/A;   INTRAUTERINE DEVICE (IUD) INSERTION N/A 06/26/2021   Procedure: INTRAUTERINE DEVICE (IUD) INSERTION;  Surgeon: Tereso Newcomer, MD;  Location: [facility];  Service: Gynecology;  Laterality: N/A;     FAMILY HISTORY:  Family History  Problem Relation Age of Onset   Depression Mother    Stroke Mother    Sudden death Mother    Heart disease Mother    Hypertension Mother    Heart attack Mother 42   Cancer Father    Prostate cancer Father         metastatic   Heart disease Father 10       CAD, STENT   Coronary artery disease Sister        stent x 2   Coronary artery disease Sister 33       stent placed   Hypertension Sister    Hypertension Sister    Heart disease Brother        Myocardial infarction   Colon cancer Maternal Aunt    Sudden death Other    Depression Other    Colon polyps Neg Hx    Esophageal cancer Neg Hx    Rectal cancer Neg Hx    Stomach cancer Neg Hx    Breast cancer Neg Hx      SOCIAL HISTORY:  reports that she quit smoking about 26 years ago. Her smoking use included cigarettes. She started smoking about 53 years ago. She has a 27 pack-year smoking history. She has never used smokeless tobacco. She reports current alcohol use of about 7.0 standard drinks of alcohol per week. She reports that she does not use drugs.   ALLERGIES: Patient has no known allergies.   MEDICATIONS:  Current Outpatient Medications  Medication Sig Dispense Refill   albuterol (VENTOLIN HFA) 108 (90 Base) MCG/ACT inhaler Inhale 2 puffs into the lungs every 6 (six) hours as needed for wheezing or shortness of breath. 8 g 2   amLODipine (NORVASC) 10 MG tablet Take 1 tablet (10 mg total) by mouth daily. for blood pressure. 14 tablet 0   aspirin EC 81 MG tablet Take 81 mg by mouth daily. Swallow whole.     Cholecalciferol (VITAMIN D3) 1000 UNITS tablet Take 1,000 Units by mouth daily.     fluticasone (FLONASE) 50 MCG/ACT nasal spray Place 1 spray into both nostrils 2 (two) times daily as needed for allergies or rhinitis. 16 g 0   Iron-Vitamin C 65-125 MG TABS Take 1 tablet by mouth daily. 30 tablet 2   loratadine (CLARITIN) 10 MG tablet Take 10 mg by mouth daily.     losartan-hydrochlorothiazide (HYZAAR) 100-25 MG tablet Take 1 tablet by mouth daily. .for blood pressure. 14 tablet 0   Melatonin 10 MG TABS Take by mouth as needed.     omeprazole (PRILOSEC) 20 MG capsule Take 1 capsule (20 mg total) by mouth daily. for heartburn.  14 capsule 0   Potassium Bicarbonate 99 MG CAPS Take 1 capsule (99 mg total) by mouth in the morning and at bedtime. 180 capsule 1   predniSONE (DELTASONE) 20 MG tablet 2 tabs po daily for 5 days, then 1 tab po daily for 5 days 15 tablet 0   rosuvastatin (CRESTOR) 20 MG tablet Take 1 tablet (20 mg total) by mouth daily. for cholesterol. 14 tablet  0   No current facility-administered medications for this visit.     REVIEW OF SYSTEMS: On review of systems, the patient reports that she is doing ***     PHYSICAL EXAM:  Wt Readings from Last 3 Encounters:  03/26/23 258 lb (117 kg)  03/23/23 257 lb 4 oz (116.7 kg)  03/16/23 247 lb (112 kg)   Temp Readings from Last 3 Encounters:  03/26/23 (!) 97.1 F (36.2 C) (Tympanic)  03/23/23 98.8 F (37.1 C) (Temporal)  11/18/22 (!) 97.3 F (36.3 C) (Temporal)   BP Readings from Last 3 Encounters:  03/26/23 (!) 148/68  03/23/23 (!) 144/80  11/18/22 (!) 108/54   Pulse Readings from Last 3 Encounters:  03/26/23 68  03/23/23 79  11/18/22 73    In general this is a well appearing *** female in no acute distress. She's alert and oriented x4 and appropriate throughout the examination. Cardiopulmonary assessment is negative for acute distress and she exhibits normal effort. Bilateral breast exam is deferred.    ECOG = ***  0 - Asymptomatic (Fully active, able to carry on all predisease activities without restriction)  1 - Symptomatic but completely ambulatory (Restricted in physically strenuous activity but ambulatory and able to carry out work of a light or sedentary nature. For example, light housework, office work)  2 - Symptomatic, <50% in bed during the day (Ambulatory and capable of all self care but unable to carry out any work activities. Up and about more than 50% of waking hours)  3 - Symptomatic, >50% in bed, but not bedbound (Capable of only limited self-care, confined to bed or chair 50% or more of waking hours)  4 - Bedbound  (Completely disabled. Cannot carry on any self-care. Totally confined to bed or chair)  5 - Death   Santiago Glad MM, Creech RH, Tormey DC, et al. [phone]). "Toxicity and response criteria of the [facility]". Am. Evlyn Clines. Oncol. 5 (6): 649-55    LABORATORY DATA:  Lab Results  Component Value Date   WBC 11.0 (H) 03/26/2023   HGB 10.6 (L) 03/26/2023   HCT 33.9 (L) 03/26/2023   MCV 78.5 (L) 03/26/2023   PLT 437 (H) 03/26/2023   Lab Results  Component Value Date   NA 140 03/26/2023   K 3.3 (L) 03/26/2023   CL 105 03/26/2023   CO2 27 03/26/2023   Lab Results  Component Value Date   ALT 14 03/26/2023   AST 19 03/26/2023   ALKPHOS 68 03/26/2023   BILITOT 0.5 03/26/2023      RADIOGRAPHY: MR BREAST BILATERAL W WO CONTRAST INC CAD Result Date: 04/09/2023 CLINICAL DATA:  68 year old female with recently diagnosed invasive ductal carcinoma at 2 sites in the left breast post ultrasound-guided core biopsy of a mass at 11 o'clock 15 cm from nipple at site of coil shaped biopsy marking clip and ultrasound-guided core biopsy of a mass at the 12 o'clock position 7 cm from the nipple at site of ribbon shaped biopsy marking clip. EXAM: BILATERAL BREAST MRI WITH AND WITHOUT CONTRAST TECHNIQUE: Multiplanar, multisequence MR images of both breasts were obtained prior to and following the intravenous administration of 10 ml of Vueway Three-dimensional MR images were rendered by post-processing of the original MR data on an independent workstation. The three-dimensional MR images were interpreted, and findings are reported in the following complete MRI report for this study. Three dimensional images were evaluated at the independent interpreting workstation using the DynaCAD thin client. COMPARISON:  None  available. FINDINGS: Breast composition: c. Heterogeneous fibroglandular tissue. Background parenchymal enhancement: Moderate. Right breast: No mass or abnormal enhancement. Left breast:  Biopsy-proven malignancy in the upper central left breast at the approximate 12 o'clock position with associated biopsy clip(postcontrast image 61) measures 1.2 x 0.9 x 0.7 cm. There is an additional suspicious irregular enhancing mass located approximately 2.5 cm inferior to this mass (image 79) measuring 1.4 x 0.5 x 0.7 cm. There is irregular enhancement/enhancing masses surrounding biopsy clip artifact at the 11 o'clock position in the upper inner posterior left breast, with the enhancement at the superior margin of the clip measuring 1 cm (image 64) and 2 small 0.4 cm enhancing masses along the anterior margin of the biopsy marking clip (image 72). All together this area spans approximately 1.5 cm. There are 2 round enhancing masses each measuring approximately 0.4 cm in the upper central left breast anterior to mid depth (image 84). Lymph nodes: No abnormal appearing lymph nodes. Ancillary findings:  None. IMPRESSION: 1. Two sites of biopsy proven malignancy in the left breast, with the mass at the 12 o'clock position measuring 1.2 x 0.9 x 0.7 cm and the mass/enhancement at the 11 o'clock position posterior depth measuring up to 1.5 cm. 2. Additional 1.4 cm suspicious mass in the upper central left breast (image 79) located inferior to the biopsy proven malignancy at the 12 o'clock position. 3. Two 0.4 cm indeterminate masses in the upper central left breast anterior to mid depth (image 84). RECOMMENDATION: 1. Recommend MRI guided biopsy of the 1.4 cm suspicious mass in the upper central left breast (image 79). 2. Recommend MRI guided biopsy of 1 of the two 0.4 cm masses in the upper central left breast anterior to mid depth (image 84). BI-RADS CATEGORY  4: Suspicious. Electronically Signed   By: Edwin Cap M.D.   On: 04/09/2023 12:51   [place] LT BREAST BX W LOC DEV 1ST LESION IMG BX SPEC US GUIDE Addendum Date: 03/31/2023 ADDENDUM REPORT: 03/31/2023 09:28 ADDENDUM: PATHOLOGY revealed: Site 1. Breast,  left, needle core biopsy, 12 o'clock, 7 cmfn (ribbon clip) : - INVASIVE DUCTAL CARCINOMA. - OVERALL GRADE: 1 - LYMPHOVASCULAR INVASION: NOT IDENTIFIED - CANCER LENGTH: 5.5 MM - CALCIFICATIONS: PRESENT. Pathology results are CONCORDANT with imaging findings, per Dr. Jacob Moores. PATHOLOGY revealed: Site 2. Breast, left, needle core biopsy, 11 o'clock, 15 cmfn (coil clip) :- INVASIVE DUCTAL CARCINOMA, - OVERALL GRADE: 1 - LYMPHOVASCULAR INVASION: NOT IDENTIFIED - CANCER LENGTH: 4 MM- CALCIFICATIONS: NOT IDENTIFIED. Pathology results are CONCORDANT with imaging findings, per Dr. Jacob Moores. Pathology results and recommendations below were discussed with patient by telephone on 03/20/2023. Patient reported biopsy site within normal limits with slight tenderness at the site. Post biopsy care instructions were reviewed, questions were answered and my direct phone number was provided to patient. Patient was instructed to call [facility] if any concerns or questions arise related to the biopsy. RECOMMENDATIONS: 1. Surgical and oncological consultation. Request for surgical and oncological consultation relayed to Irving Shows RN at [facility] by Randa Lynn RN on 03/23/2023. Pathology results reported by Randa Lynn RN on 03/23/2023. Electronically Signed   By: Jacob Moores M.D.   On: 03/31/2023 09:28   Result Date: 03/31/2023 CLINICAL DATA:  68 year old female with suspicious mass in the left breast 12 o'clock position. Of note, on real-time ultrasound prior to the planned biopsy, an additional suspicious mass was noted at 11 o'clock 15 cm from the nipple. Therefore, a 2-site  ultrasound biopsy was performed. EXAM: ULTRASOUND GUIDED LEFT BREAST CORE NEEDLE BIOPSY COMPARISON:  Previous exam(s). PROCEDURE: During the pre-procedure ultrasound assessment, an additional irregular hypoechoic mass was noted at 11 o'clock 15 cm from the nipple, measuring 3 x 3 x 3 mm. This is favored to  correspond with a component of the additional focal asymmetry in the upper inner left breast posterior depth, for which stereotactic guided biopsy had been recommended. Therefore, the decision was made to proceed with ultrasound-guided biopsy of this mass, with attention to post-procedure mammogram. Additionally, targeted left axillary ultrasound demonstrates morphologically benign-appearing lymph nodes. No lymphadenopathy is identified. I met with the patient and we discussed the procedure of ultrasound-guided biopsy, including benefits and alternatives. We discussed the high likelihood of a successful procedure. We discussed the risks of the procedure, including infection, bleeding, tissue injury, clip migration, and inadequate sampling. Informed written consent was given. The usual time-out protocol was performed immediately prior to the procedure. SITE 1: Left breast mass in the 12 o'clock position 7 cm from the nipple (ribbon clip) Lesion quadrant: Upper inner quadrant Using sterile technique and 1% Lidocaine as local anesthetic, under direct ultrasound visualization, a 14 gauge spring-loaded device was used to perform biopsy of a mass in the left breast 12 o'clock position 7 cm from the nipple using a lateral approach. At the conclusion of the procedure, a ribbon shaped tissue marker clip was deployed into the biopsy cavity. SITE 2: Left breast mass in the 11 o'clock position 15 cm from the nipple (coil clip) Lesion quadrant: Upper inner quadrant Using sterile technique and 1% Lidocaine as local anesthetic, under direct ultrasound visualization, a 14 gauge spring-loaded device was used to perform biopsy of a mass in the left breast 11 o'clock position 15 cm from the nipple using a lateral approach. At the conclusion of the procedure, a coil shaped tissue marker clip was deployed into the biopsy cavity. Follow up 2 view mammogram was performed and dictated separately. IMPRESSION: 1. Ultrasound guided biopsy of  a mass in the left breast 12 o'clock position 7 cm from the nipple (ribbon clip). No apparent complications. 2. Ultrasound-guided biopsy of a mass in the left breast 11 o'clock position 15 cm from the nipple (coil clip). No apparent complications. 3. No left axillary lymphadenopathy noted on targeted left axillary ultrasound. Electronically Signed: By: Jacob Moores M.D. On: 03/17/2023 10:07   [place] LT BREAST BX W LOC DEV EA ADD LESION IMG BX SPEC US GUIDE Addendum Date: 03/31/2023 ADDENDUM REPORT: 03/31/2023 09:28 ADDENDUM: PATHOLOGY revealed: Site 1. Breast, left, needle core biopsy, 12 o'clock, 7 cmfn (ribbon clip) : - INVASIVE DUCTAL CARCINOMA. - OVERALL GRADE: 1 - LYMPHOVASCULAR INVASION: NOT IDENTIFIED - CANCER LENGTH: 5.5 MM - CALCIFICATIONS: PRESENT. Pathology results are CONCORDANT with imaging findings, per Dr. Jacob Moores. PATHOLOGY revealed: Site 2. Breast, left, needle core biopsy, 11 o'clock, 15 cmfn (coil clip) :- INVASIVE DUCTAL CARCINOMA, - OVERALL GRADE: 1 - LYMPHOVASCULAR INVASION: NOT IDENTIFIED - CANCER LENGTH: 4 MM- CALCIFICATIONS: NOT IDENTIFIED. Pathology results are CONCORDANT with imaging findings, per Dr. Jacob Moores. Pathology results and recommendations below were discussed with patient by telephone on 03/20/2023. Patient reported biopsy site within normal limits with slight tenderness at the site. Post biopsy care instructions were reviewed, questions were answered and my direct phone number was provided to patient. Patient was instructed to call [facility] if any concerns or questions arise related to the biopsy. RECOMMENDATIONS: 1. Surgical and oncological consultation. Request for surgical  and oncological consultation relayed to Irving Shows RN at [facility] by Randa Lynn RN on 03/23/2023. Pathology results reported by Randa Lynn RN on 03/23/2023. Electronically Signed   By: Jacob Moores M.D.   On: 03/31/2023 09:28   Result Date:  03/31/2023 CLINICAL DATA:  68 year old female with suspicious mass in the left breast 12 o'clock position. Of note, on real-time ultrasound prior to the planned biopsy, an additional suspicious mass was noted at 11 o'clock 15 cm from the nipple. Therefore, a 2-site ultrasound biopsy was performed. EXAM: ULTRASOUND GUIDED LEFT BREAST CORE NEEDLE BIOPSY COMPARISON:  Previous exam(s). PROCEDURE: During the pre-procedure ultrasound assessment, an additional irregular hypoechoic mass was noted at 11 o'clock 15 cm from the nipple, measuring 3 x 3 x 3 mm. This is favored to correspond with a component of the additional focal asymmetry in the upper inner left breast posterior depth, for which stereotactic guided biopsy had been recommended. Therefore, the decision was made to proceed with ultrasound-guided biopsy of this mass, with attention to post-procedure mammogram. Additionally, targeted left axillary ultrasound demonstrates morphologically benign-appearing lymph nodes. No lymphadenopathy is identified. I met with the patient and we discussed the procedure of ultrasound-guided biopsy, including benefits and alternatives. We discussed the high likelihood of a successful procedure. We discussed the risks of the procedure, including infection, bleeding, tissue injury, clip migration, and inadequate sampling. Informed written consent was given. The usual time-out protocol was performed immediately prior to the procedure. SITE 1: Left breast mass in the 12 o'clock position 7 cm from the nipple (ribbon clip) Lesion quadrant: Upper inner quadrant Using sterile technique and 1% Lidocaine as local anesthetic, under direct ultrasound visualization, a 14 gauge spring-loaded device was used to perform biopsy of a mass in the left breast 12 o'clock position 7 cm from the nipple using a lateral approach. At the conclusion of the procedure, a ribbon shaped tissue marker clip was deployed into the biopsy cavity. SITE 2: Left breast  mass in the 11 o'clock position 15 cm from the nipple (coil clip) Lesion quadrant: Upper inner quadrant Using sterile technique and 1% Lidocaine as local anesthetic, under direct ultrasound visualization, a 14 gauge spring-loaded device was used to perform biopsy of a mass in the left breast 11 o'clock position 15 cm from the nipple using a lateral approach. At the conclusion of the procedure, a coil shaped tissue marker clip was deployed into the biopsy cavity. Follow up 2 view mammogram was performed and dictated separately. IMPRESSION: 1. Ultrasound guided biopsy of a mass in the left breast 12 o'clock position 7 cm from the nipple (ribbon clip). No apparent complications. 2. Ultrasound-guided biopsy of a mass in the left breast 11 o'clock position 15 cm from the nipple (coil clip). No apparent complications. 3. No left axillary lymphadenopathy noted on targeted left axillary ultrasound. Electronically Signed: By: Jacob Moores M.D. On: 03/17/2023 10:07   VAS US CAROTID Result Date: 03/19/2023 Carotid Arterial Duplex Study Patient Name:  AKIRA PERUSSE [facility]  Date of Exam:   03/19/2023 Medical Rec #: [id number]            Accession #:    [id number] Date of Birth: [birth year]            Patient Gender: F Patient Age:   69 years Exam Location:  [place] Procedure:      VAS US CAROTID Referring Phys: Vernona Rieger --------------------------------------------------------------------------------  Risk Factors:      Hypertension, hyperlipidemia, Diabetes, past history of  smoking, PAD. Other Factors:     Patient denied symptoms. Comparison Study:  Carotid duplex on 08/22/20 showed RICA velocities of 88/24 cm/s                    and LICA velocities of 104/23 cm/s. Bidirectional flow in the                    right vertebral artery Performing Technologist: Quentin Ore RDMS, RVT, RDCS  Examination Guidelines: A complete evaluation includes B-mode imaging, spectral Doppler, color Doppler, and power  Doppler as needed of all accessible portions of each vessel. Bilateral testing is considered an integral part of a complete examination. Limited examinations for reoccurring indications may be performed as noted.  Right Carotid Findings: +----------+--------+--------+--------+-------------------+--------+           PSV cm/sEDV cm/sStenosisPlaque Description Comments +----------+--------+--------+--------+-------------------+--------+ CCA Prox  110     17                                          +----------+--------+--------+--------+-------------------+--------+ CCA Distal67      14      <50%    smooth and calcific         +----------+--------+--------+--------+-------------------+--------+ ICA Prox  65      8       1-39%   smooth and calcific         +----------+--------+--------+--------+-------------------+--------+ ICA Mid   89      22                                          +----------+--------+--------+--------+-------------------+--------+ ICA Distal113     29                                          +----------+--------+--------+--------+-------------------+--------+ ECA       187             >50%                                +----------+--------+--------+--------+-------------------+--------+ +----------+--------+-------+--------+-------------------+           PSV cm/sEDV cmsDescribeArm Pressure (mmHG) +----------+--------+-------+--------+-------------------+ Subclavian270            Stenotic                    +----------+--------+-------+--------+-------------------+ +---------+--------+--------+---------------+ VertebralPSV cm/sEDV cm/sBi- directional +---------+--------+--------+---------------+ Brachial artery waveform was biphasic and rounded Left Carotid Findings: +----------+--------+--------+--------+----------------------+--------+           PSV cm/sEDV cm/sStenosisPlaque Description    Comments  +----------+--------+--------+--------+----------------------+--------+ CCA Prox  93      13                                             +----------+--------+--------+--------+----------------------+--------+ CCA Distal82      15      <50%    smooth and calcific            +----------+--------+--------+--------+----------------------+--------+ ICA Prox  61      18                                             +----------+--------+--------+--------+----------------------+--------+  ICA Mid   118     28                                             +----------+--------+--------+--------+----------------------+--------+ ICA Distal134     33                                             +----------+--------+--------+--------+----------------------+--------+ ECA       211             >50%    calcific and irregular         +----------+--------+--------+--------+----------------------+--------+ +----------+--------+--------+--------+-------------------+           PSV cm/sEDV cm/sDescribeArm Pressure (mmHG) +----------+--------+--------+--------+-------------------+ [id number]             Stenotic                    +----------+--------+--------+--------+-------------------+ +---------+--------+--+--------+--+---------+ VertebralPSV cm/s83EDV cm/s18Antegrade +---------+--------+--+--------+--+---------+ Brachial artery waveform was brisk and triphasic  Summary: Right Carotid: Velocities in the right ICA are consistent with a 1-39% stenosis.                The ECA appears >50% stenosed. Left Carotid: Velocities in the left ICA are consistent with a 1-39% stenosis.               The ECA appears >50% stenosed. Vertebrals:  Left vertebral artery demonstrates antegrade flow. Right vertebral              artery demonstrates bidirectional flow. Subclavians: Bilateral subclavian arteries were stenotic. *See table(s) above for measurements and observations. Suggest follow up  study in 12 months. Electronically signed by Charlton Haws MD on 03/19/2023 at 4:50:32 PM.    Final    MM CLIP PLACEMENT LEFT Result Date: 03/17/2023 CLINICAL DATA:  Status post 2-site ultrasound-guided biopsy for 2 suspicious masses in the left breast from 11-12 o'clock. EXAM: 3D DIAGNOSTIC LEFT MAMMOGRAM POST ULTRASOUND BIOPSY COMPARISON:  Previous exam(s). FINDINGS: 3D Mammographic images were obtained following ultrasound guided biopsy of 2 masses in the left breast. The RIBBON shaped biopsy marking clip is in expected position at the site of biopsy in the left breast 12 o'clock position 7 cm from the nipple. The COIL shaped biopsy marking clip is in expected position at the site of biopsy in the left breast 11 o'clock position 15 cm from the nipple. Notably, this area corresponds with the previously described focal asymmetry with subtle distortion in the upper inner left breast posterior depth, for which stereotactic guided biopsy had been recommended. Given the mammographic/sonographic correlation, a stereotactic guided biopsy will not be performed. IMPRESSION: Appropriate positioning of the ribbon and coil shaped biopsy marking clips at the sites of biopsy in the left breast upper inner quadrant. Final Assessment: Post Procedure Mammograms for Marker Placement Electronically Signed   By: Jacob Moores M.D.   On: 03/17/2023 10:17       IMPRESSION/PLAN: 1. 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 Dr. Mitzi Hansen discusses the pathology findings and reviews the nature of *** breast disease. The consensus from the breast conference includes breast conservation with lumpectomy with *** sentinel node biopsy. Depending on the size of the final tumor measurements rendered by pathology, the  tumor may be tested for Oncotype Dx score to determine a role for systemic therapy. Provided that chemotherapy is not indicated, the patient's course would then be followed by  external radiotherapy to the breast  to reduce risks of local recurrence. Dr. Marland Kitchen anticipates adjuvant antiestrogen therapy to follow. We discussed the risks, benefits, short, and long term effects of radiotherapy, as well as the curative intent, and the patient is interested in proceeding. Dr. Mitzi Hansen discusses the delivery and logistics of radiotherapy and anticipates a course of *** weeks of radiotherapy. We will see her back a few weeks after surgery to discuss the simulation process and anticipate we starting radiotherapy about 4-6 weeks after surgery.   2. Possible genetic predisposition to malignancy. The patient has met with genetics and is awaiting her testing results.  In a visit lasting *** minutes, greater than 50% of the time was spent face to face reviewing her case, as well as in preparation of, discussing, and coordinating the patient's care.  The above documentation reflects my direct findings during this shared patient visit. Please see the separate note by Dr. Mitzi Hansen on this date for the remainder of the patient's plan of care.    Osker Mason, [facility]    **Disclaimer: This note was dictated with voice recognition software. Similar sounding words can inadvertently be transcribed and this note may contain transcription errors which may not have been corrected upon publication of note.**

## 2023-04-14 ENCOUNTER — Encounter: Payer: Self-pay | Admitting: Radiation Oncology

## 2023-04-14 ENCOUNTER — Ambulatory Visit
Admission: RE | Admit: 2023-04-14 | Discharge: 2023-04-14 | Disposition: A | Discharge status: Home / Self Care | Payer: Medicare Other | Source: Ambulatory Visit | Attending: Radiation Oncology | Admitting: Radiation Oncology

## 2023-04-14 ENCOUNTER — Ambulatory Visit
Admission: RE | Admit: 2023-04-14 | Discharge: 2023-04-14 | Discharge status: Home / Self Care | Payer: Medicare Other | Source: Ambulatory Visit | Attending: Radiation Oncology | Admitting: Radiation Oncology

## 2023-04-14 ENCOUNTER — Encounter: Payer: Self-pay | Admitting: Nutrition

## 2023-04-14 VITALS — BP 167/83 | HR 73 | Temp 97.8°F | Resp 20 | Ht 67.5 in | Wt 257.6 lb

## 2023-04-14 DIAGNOSIS — Z170 Estrogen receptor positive status [ER+]: Secondary | ICD-10-CM | POA: Insufficient documentation

## 2023-04-14 DIAGNOSIS — E559 Vitamin D deficiency, unspecified: Secondary | ICD-10-CM | POA: Insufficient documentation

## 2023-04-14 DIAGNOSIS — D509 Iron deficiency anemia, unspecified: Secondary | ICD-10-CM | POA: Insufficient documentation

## 2023-04-14 DIAGNOSIS — Z8042 Family history of malignant neoplasm of prostate: Secondary | ICD-10-CM | POA: Insufficient documentation

## 2023-04-14 DIAGNOSIS — E785 Hyperlipidemia, unspecified: Secondary | ICD-10-CM | POA: Diagnosis not present

## 2023-04-14 DIAGNOSIS — I10 Essential (primary) hypertension: Secondary | ICD-10-CM | POA: Insufficient documentation

## 2023-04-14 DIAGNOSIS — G4730 Sleep apnea, unspecified: Secondary | ICD-10-CM | POA: Diagnosis not present

## 2023-04-14 DIAGNOSIS — Z86711 Personal history of pulmonary embolism: Secondary | ICD-10-CM | POA: Insufficient documentation

## 2023-04-14 DIAGNOSIS — Z800 Family history of malignant neoplasm of digestive organs: Secondary | ICD-10-CM | POA: Diagnosis not present

## 2023-04-14 DIAGNOSIS — C50212 Malignant neoplasm of upper-inner quadrant of left female breast: Secondary | ICD-10-CM

## 2023-04-14 DIAGNOSIS — K219 Gastro-esophageal reflux disease without esophagitis: Secondary | ICD-10-CM | POA: Insufficient documentation

## 2023-04-14 DIAGNOSIS — R011 Cardiac murmur, unspecified: Secondary | ICD-10-CM | POA: Diagnosis not present

## 2023-04-16 ENCOUNTER — Ambulatory Visit
Admission: RE | Admit: 2023-04-16 | Discharge: 2023-04-16 | Discharge status: Home / Self Care | Payer: Medicare Other | Source: Ambulatory Visit | Attending: General Surgery | Admitting: General Surgery

## 2023-04-16 ENCOUNTER — Other Ambulatory Visit: Payer: Self-pay | Admitting: Body Imaging

## 2023-04-16 ENCOUNTER — Ambulatory Visit
Admission: RE | Admit: 2023-04-16 | Discharge: 2023-04-16 | Disposition: A | Discharge status: Home / Self Care | Payer: Medicare Other | Source: Ambulatory Visit | Attending: General Surgery | Admitting: General Surgery

## 2023-04-16 DIAGNOSIS — N6322 Unspecified lump in the left breast, upper inner quadrant: Secondary | ICD-10-CM

## 2023-04-16 DIAGNOSIS — R928 Other abnormal and inconclusive findings on diagnostic imaging of breast: Secondary | ICD-10-CM

## 2023-04-16 MED ORDER — GADOPICLENOL 0.5 MMOL/ML IV SOLN
10.0000 mL | Freq: Once | INTRAVENOUS | Status: AC | PRN
  Administered 2023-04-16: 10 mL via INTRAVENOUS

## 2023-04-17 ENCOUNTER — Telehealth: Payer: Self-pay

## 2023-04-17 LAB — SURGICAL PATHOLOGY

## 2023-04-17 NOTE — Telephone Encounter (Signed)
Clinical Social Work was referred by medical provider, Saddie Benders, for assessment of psychosocial needs.  CSW attempted to contact patient by phone.  Left voicemail with contact information and request for return call.

## 2023-04-20 ENCOUNTER — Inpatient Hospital Stay: Payer: Medicare Other | Attending: Oncology

## 2023-04-20 ENCOUNTER — Other Ambulatory Visit: Payer: Self-pay | Admitting: General Surgery

## 2023-04-20 NOTE — Progress Notes (Signed)
[facility] Clinical Social Work  Initial Assessment   Paige Hester is a 68 y.o. year old female contacted by phone. Clinical Social Work was referred by medical provider, Saddie Benders, for assessment of psychosocial needs.   SDOH (Social Determinants of Health) assessments performed: Yes SDOH Interventions    Flowsheet Row CONSULT from 04/14/2023 in [facility] Radiation Oncology Clinical Support from 03/16/2023 in [facility] Visit from 08/06/2022 in [facility] [place] [facility] Office Visit from 07/15/2022 in [facility] [place] Telephone from 02/28/2022 in [facility] Care Coordination Clinical Support from 12/31/2021 in [facility] [place] [facility] at [place]  SDOH Interventions        Food Insecurity Interventions Other (Comment)  [Food Bag given today.] Intervention Not Indicated -- -- Intervention Not Indicated --  Housing Interventions -- Intervention Not Indicated -- -- Intervention Not Indicated --  Transportation Interventions -- Intervention Not Indicated -- -- Intervention Not Indicated --  Utilities Interventions -- Intervention Not Indicated -- -- -- Other (Comment)  [Notes thinking about future concerns as winter months approach. Further follow up at this time declined.]  Alcohol Usage Interventions -- Intervention Not Indicated (Score <7) -- -- -- --  Depression Interventions/Treatment  -- -- Counseling Counseling -- --  Financial Strain Interventions -- Intervention Not Indicated -- -- -- Other (Comment)  [Basic need of having enough food. Meals on wheels referral placed per patient request.]  Physical Activity Interventions -- Intervention Not Indicated -- -- -- --  Stress Interventions -- Intervention Not Indicated -- -- -- --  [Declines further follow up at this time.]  Social Connections Interventions -- Intervention Not Indicated -- -- --  Intervention Not Indicated  [Lives next door to sister. Not socially isolated.]  Health Literacy Interventions -- Intervention Not Indicated -- -- -- --       SDOH Screenings   Food Insecurity: Food Insecurity Present (04/14/2023)  Housing: Low Risk  (04/14/2023)  Transportation Needs: No Transportation Needs (04/14/2023)  Utilities: At Risk (04/14/2023)  Alcohol Screen: Low Risk  (03/19/2023)  Depression (PHQ2-9): Low Risk  (04/14/2023)  Financial Resource Strain: High Risk (03/19/2023)  Physical Activity: Inactive (03/19/2023)  Social Connections: Moderately Integrated (03/19/2023)  Stress: No Stress Concern Present (03/19/2023)  Tobacco Use: Medium Risk (04/20/2023)   Received from [facility]  Health Literacy: Adequate Health Literacy (03/16/2023)     Distress Screen completed: No     No data to display            Family/Social Information:  Housing Arrangement: patient lives alone.  Her sister lives next door. Family members/support persons in your life? Family and Church.  Patient's son lives in [place]. Transportation concerns: no  Employment: Retired  Income source: Actor concerns: Yes, due to illness and/or loss of work during treatment Type of concern: Chief of Staff access concerns: yes Religious or spiritual practice: Yes-Patient attends church. Advanced directives: No Services Currently in place:  Medicare  Coping/ Adjustment to diagnosis: Patient understands treatment plan and what happens next? 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 Current coping skills/ strengths: Active sense of humor , Average or above average intelligence , Capable of independent living , Communication skills , General fund of knowledge , Motivation for treatment/growth , and  Supportive family/friends     SUMMARY: Current SDOH Barriers:  Financial constraints related to fixed income.  Clinical Social Work Clinical Goal(s):  Scientist, research (life sciences) options for unmet needs related to:  Corporate treasurer  and Food Insecurity   Interventions: Discussed common feeling and emotions when being diagnosed with cancer, and the importance of support during treatment Informed patient of the support team roles and support services at [facility] Provided CSW contact information and encouraged patient to call with any questions or concerns Provided patient with information about [employer] assistance program.  Securely emailed her food bank information for both [place] and [place].   Follow Up Plan: CSW will follow-up with patient by phone  Patient verbalizes understanding of plan: Yes    Dorothey Baseman, LCSW Clinical Social Worker [facility]

## 2023-04-21 ENCOUNTER — Encounter: Payer: Self-pay | Admitting: *Deleted

## 2023-04-21 NOTE — Progress Notes (Signed)
Order faxed and [facility] called to add breast prognostics ER/PR/HER2 to most recent biopsy.

## 2023-04-22 ENCOUNTER — Inpatient Hospital Stay: Payer: Medicare Other

## 2023-04-22 DIAGNOSIS — C50919 Malignant neoplasm of unspecified site of unspecified female breast: Secondary | ICD-10-CM

## 2023-04-22 NOTE — Progress Notes (Signed)
 Multidisciplinary Oncology Council Documentation  Paige Hester was presented by our [facility] on 04/22/2023, which included representatives from:  Palliative Care Dietitian  Physical/Occupational Therapist Nurse Navigator Genetics Social work Survivorship RN Financial Navigator Research RN   Paige Hester currently presents with history of breast cancer  We reviewed previous medical and familial history, history of present illness, and recent lab results along with all available histopathologic and imaging studies. The MOC considered available treatment options and made the following recommendations/referrals:  Rehab screening, SW  The MOC is a meeting of clinicians from various specialty areas who evaluate and discuss patients for whom a multidisciplinary approach is being considered. Final determinations in the plan of care are those of the provider(s).   Today's extended care, comprehensive team conference, Paige Hester was not present for the discussion and was not examined.

## 2023-04-27 ENCOUNTER — Telehealth: Payer: Self-pay

## 2023-04-27 NOTE — Telephone Encounter (Signed)
 Copied from CRM [id number]. Topic: Clinical - Medication Question >> Apr 27, 2023 12:05 PM Earnestine Goes B wrote: Reason for CRM: pt called to speak with her provider regarding her breast cancer surgery. Please call pt back at [phone]

## 2023-04-27 NOTE — Telephone Encounter (Signed)
 Can we find any specific information she is requesting?  If she would like to speak with me, then I will try to reach her after hours sometimes week.

## 2023-04-28 ENCOUNTER — Encounter: Payer: Self-pay | Admitting: *Deleted

## 2023-04-28 NOTE — Telephone Encounter (Signed)
Called patient and reviewed all information. Patient verbalized understanding. Will call if any further questions.

## 2023-04-28 NOTE — Telephone Encounter (Signed)
Yes, I can see where she met with Dr. Hedwig Morton and the decision was for surgery. Please tell her thank you for the update as well!

## 2023-04-28 NOTE — Telephone Encounter (Signed)
Unable to reach patient. Left voicemail to return call to our office.

## 2023-04-28 NOTE — Telephone Encounter (Signed)
Copied from CRM [id number]. Topic: Clinical - Medical Advice >> Apr 28, 2023 10:21 AM Ernst Spell wrote: Reason for CRM: pt called to return the nurse's call. She explained that she just wanted to confirm if the clinic received information that she will be having breast cancer surgery on March 6th. Please advise.

## 2023-04-28 NOTE — Progress Notes (Signed)
Paige Hester is going to have surgery in [place].   She wanted to wait for about 5 weeks after surgery before seeing Dr. Cathie Hoops and Marisue Humble.    She has been scheduled for 4/16.

## 2023-05-07 ENCOUNTER — Ambulatory Visit: Payer: Medicare Other | Admitting: Primary Care

## 2023-05-11 ENCOUNTER — Encounter: Payer: Self-pay | Admitting: Primary Care

## 2023-05-11 ENCOUNTER — Ambulatory Visit (INDEPENDENT_AMBULATORY_CARE_PROVIDER_SITE_OTHER): Payer: Medicare Other | Admitting: Primary Care

## 2023-05-11 VITALS — BP 136/68 | HR 70 | Temp 98.1°F | Ht 67.5 in | Wt 254.0 lb

## 2023-05-11 DIAGNOSIS — Z01818 Encounter for other preprocedural examination: Secondary | ICD-10-CM

## 2023-05-11 DIAGNOSIS — R7303 Prediabetes: Secondary | ICD-10-CM

## 2023-05-11 NOTE — Patient Instructions (Signed)
 Stop by the lab prior to leaving today. I will notify you of your results once received.   It was a pleasure to see you today!

## 2023-05-11 NOTE — Progress Notes (Signed)
 Subjective:    Patient ID: Paige Hester, female    DOB: May 23, 1955, 68 y.o.   MRN: [id number]  HPI  Paige Hester is a very pleasant 68 y.o. female with a history of hypertension, cardiomegaly, OSA, iron deficiency anemia, prediabetes, hyperlipidemia, breast cancer who presents today for preoperative clearance.  She is pending lumpectomy and reduction mammoplasty of the left breast due to malignant neoplasm of overlapping sites of left breast and reduction of the right breast to match the left.  Following with plastic surgery, Dr. Hedwig Morton from [place] [facility] [place].  Following with Dr. Debara Pickett for [facility]  She is scheduled for May 21, 2023.   BP Readings from Last 3 Encounters:  05/11/23 136/68  04/14/23 (!) 167/83  03/26/23 (!) 148/68      Review of Systems  Constitutional:  Negative for unexpected weight change.  HENT:  Negative for rhinorrhea.   Respiratory:  Negative for cough and shortness of breath.   Cardiovascular:  Negative for chest pain.  Gastrointestinal:  Negative for constipation and diarrhea.  Genitourinary:  Negative for difficulty urinating.  Skin:  Negative for rash.  Allergic/Immunologic: Negative for environmental allergies.  Neurological:  Negative for dizziness and headaches.         Past Medical History:  Diagnosis Date   Allergy    Bilateral swelling of feet and ankles    Family history of colon cancer    Family history of prostate cancer    Gallbladder polyp 2012   GERD (gastroesophageal reflux disease)    Hyperlipidemia    Hypertension    Iron deficiency anemia    Menopausal symptoms    since 2007   Murmur, cardiac    Other fatigue    Prediabetes    Pulmonary embolism (HCC) 06/2020   Shortness of breath    with exertion; patient going to pulmonary rehabilitation   Shortness of breath on exertion    Sleep apnea    Uses CPAP   Vitamin D deficiency     Social History    Socioeconomic History   Marital status: Legally Separated    Spouse name: Not on file   Number of children: 1   Years of education: Not on file   Highest education level: Bachelor's degree (e.g., BA, AB, BS)  Occupational History   Occupation: Retired  Tobacco Use   Smoking status: Former    Current packs/day: 0.00    Average packs/day: 1 pack/day for 27.0 years (27.0 ttl pk-yrs)    Types: Cigarettes    Start date: 71    Quit date: 1999    Years since quitting: 26.1   Smokeless tobacco: Never  Vaping Use   Vaping status: Never Used  Substance and Sexual Activity   Alcohol use: Yes    Alcohol/week: 7.0 standard drinks of alcohol    Types: 7 Glasses of wine per week    Comment: 1-2 glasses of wine per night   Drug use: No   Sexual activity: Yes  Other Topics Concern   Not on file  Social History Narrative   Married.   1 child, 1 grandchild.   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   Enjoys going to [employer], singing.    Social Drivers of Corporate investment banker Strain: High Risk (03/19/2023)   Overall Financial Resource Strain (CARDIA)    Difficulty of Paying Living Expenses: Hard  Food Insecurity: Food Insecurity Present (  04/14/2023)   Hunger Vital Sign    Worried About Running Out of Food in the Last Year: Sometimes true    Ran Out of Food in the Last Year: Sometimes true  Transportation Needs: No Transportation Needs (04/14/2023)   PRAPARE - Administrator, Civil Service (Medical): No    Lack of Transportation (Non-Medical): No  Physical Activity: Inactive (03/19/2023)   Exercise Vital Sign    Days of Exercise per Week: 0 days    Minutes of Exercise per Session: 10 min  Stress: No Stress Concern Present (03/19/2023)   [employer] of Occupational Health - Occupational Stress Questionnaire    Feeling of Stress : Not at all  Social Connections: Moderately Integrated (03/19/2023)   Social Connection and  Isolation Panel [NHANES]    Frequency of Communication with Friends and Family: More than three times a week    Frequency of Social Gatherings with Friends and Family: Three times a week    Attends Religious Services: More than 4 times per year    Active Member of Clubs or Organizations: Yes    Attends Banker Meetings: More than 4 times per year    Marital Status: Separated  Intimate Partner Violence: Not At Risk (04/14/2023)   Humiliation, Afraid, Rape, and Kick questionnaire    Fear of Current or Ex-Partner: No    Emotionally Abused: No    Physically Abused: No    Sexually Abused: No    Past Surgical History:  Procedure Laterality Date   BREAST BIOPSY Left 03/17/2023   [place] bx,Ribbon Clip, path pending   BREAST BIOPSY Left 03/17/2023   [place] Bx, Coil Clip,path pending   BREAST BIOPSY Left 03/17/2023   [place] LT BREAST BX W LOC DEV 1ST LESION IMG BX SPEC US GUIDE 03/17/2023 [facility]   BREAST BIOPSY Left 03/17/2023   [place] LT BREAST BX W LOC DEV EA ADD LESION IMG BX SPEC US GUIDE 03/17/2023 [facility]   COLONOSCOPY  2010   2020   HYSTEROSCOPY WITH D & C N/A 07/27/2019   Procedure: DILATATION AND CURETTAGE /HYSTEROSCOPY, Polypectomy;  Surgeon: Tereso Newcomer, MD;  Location: [facility];  Service: Gynecology;  Laterality: N/A;   HYSTEROSCOPY WITH D & C N/A 06/26/2021   Procedure: DILATATION AND CURETTAGE /HYSTEROSCOPY;  Surgeon: Tereso Newcomer, MD;  Location: [facility];  Service: Gynecology;  Laterality: N/A;   INTRAUTERINE DEVICE (IUD) INSERTION N/A 06/26/2021   Procedure: INTRAUTERINE DEVICE (IUD) INSERTION;  Surgeon: Tereso Newcomer, MD;  Location: [facility];  Service: Gynecology;  Laterality: N/A;    Family History  Problem Relation Age of Onset   Depression Mother    Stroke Mother    Sudden death Mother    Heart disease Mother    Hypertension Mother    Heart attack Mother 23   Cancer Father     Prostate cancer Father        metastatic   Heart disease Father 32       CAD, STENT   Coronary artery disease Sister        stent x 2   Coronary artery disease Sister 26       stent placed   Hypertension Sister    Hypertension Sister    Heart disease Brother        Myocardial infarction   Colon cancer Maternal Aunt    Sudden death Other    Depression Other    Colon polyps  Neg Hx    Esophageal cancer Neg Hx    Rectal cancer Neg Hx    Stomach cancer Neg Hx    Breast cancer Neg Hx     No Known Allergies  Current Outpatient Medications on File Prior to Visit  Medication Sig Dispense Refill   albuterol (VENTOLIN HFA) 108 (90 Base) MCG/ACT inhaler Inhale 2 puffs into the lungs every 6 (six) hours as needed for wheezing or shortness of breath. 8 g 2   amLODipine (NORVASC) 10 MG tablet Take 1 tablet (10 mg total) by mouth daily. for blood pressure. 14 tablet 0   aspirin EC 81 MG tablet Take 81 mg by mouth daily. Swallow whole.     Cholecalciferol (VITAMIN D3) 1000 UNITS tablet Take 1,000 Units by mouth daily.     fluticasone (FLONASE) 50 MCG/ACT nasal spray Place 1 spray into both nostrils 2 (two) times daily as needed for allergies or rhinitis. 16 g 0   Iron-Vitamin C 65-125 MG TABS Take 1 tablet by mouth daily. 30 tablet 2   losartan-hydrochlorothiazide (HYZAAR) 100-25 MG tablet Take 1 tablet by mouth daily. .for blood pressure. 14 tablet 0   Melatonin 10 MG TABS Take by mouth as needed.     omeprazole (PRILOSEC) 20 MG capsule Take 1 capsule (20 mg total) by mouth daily. for heartburn. 14 capsule 0   Oyster Shell (OYSTER CALCIUM) 500 MG TABS tablet Take 600 mg of elemental calcium by mouth 2 (two) times daily.     Potassium Bicarbonate 99 MG CAPS Take 1 capsule (99 mg total) by mouth in the morning and at bedtime. 180 capsule 1   rosuvastatin (CRESTOR) 20 MG tablet Take 1 tablet (20 mg total) by mouth daily. for cholesterol. 14 tablet 0   loratadine (CLARITIN) 10 MG tablet Take 10 mg  by mouth daily. (Patient not taking: Reported on 05/11/2023)     No current facility-administered medications on file prior to visit.    BP 136/68   Pulse 70   Temp 98.1 F (36.7 C) (Temporal)   Ht 5' 7.5" (1.715 m)   Wt 254 lb (115.2 kg)   SpO2 98%   BMI 39.19 kg/m  Objective:   Physical Exam HENT:     Right Ear: Tympanic membrane and ear canal normal.     Left Ear: Tympanic membrane and ear canal normal.  Eyes:     Pupils: Pupils are equal, round, and reactive to light.  Cardiovascular:     Rate and Rhythm: Normal rate and regular rhythm.     Heart sounds: Murmur heard.  Pulmonary:     Effort: Pulmonary effort is normal.     Breath sounds: Normal breath sounds.  Abdominal:     General: Bowel sounds are normal.     Palpations: Abdomen is soft.     Tenderness: There is no abdominal tenderness.  Musculoskeletal:        General: Normal range of motion.     Cervical back: Neck supple.  Skin:    General: Skin is warm and dry.  Neurological:     Mental Status: She is alert and oriented to person, place, and time.     Cranial Nerves: No cranial nerve deficit.     Deep Tendon Reflexes:     Reflex Scores:      Patellar reflexes are 2+ on the right side and 2+ on the left side. Psychiatric:        Mood and Affect: Mood normal.  Assessment & Plan:  Preoperative clearance Assessment & Plan: Review of systems and examination today stable.  Labs pending today including BMP, A1c, CBC. She will obtain cardiac clearance from her cardiologist this week.  This will include ECG.  Await lab results.       Orders: -     Hemoglobin A1c -     Basic metabolic panel -     CBC  Prediabetes Assessment & Plan: Repeat A1C pending.    Orders: -     Hemoglobin A1c        Doreene Nest, NP

## 2023-05-11 NOTE — Assessment & Plan Note (Signed)
 Repeat A1C pending.

## 2023-05-11 NOTE — Assessment & Plan Note (Signed)
 Review of systems and examination today stable.  Labs pending today including BMP, A1c, CBC. She will obtain cardiac clearance from her cardiologist this week.  This will include ECG.  Await lab results.

## 2023-05-12 LAB — BASIC METABOLIC PANEL
BUN: 10 mg/dL (ref 6–23)
CO2: 32 meq/L (ref 19–32)
Calcium: 10.3 mg/dL (ref 8.4–10.5)
Chloride: 102 meq/L (ref 96–112)
Creatinine, Ser: 0.75 mg/dL (ref 0.40–1.20)
GFR: 81.87 mL/min (ref >60.00)
Glucose, Bld: 93 mg/dL (ref 70–99)
Potassium: 3.8 meq/L (ref 3.5–5.1)
Sodium: 143 meq/L (ref 135–145)

## 2023-05-12 LAB — CBC
HCT: 36.6 % (ref 36.0–46.0)
Hemoglobin: 11.7 g/dL — ABNORMAL LOW (ref 12.0–15.0)
MCHC: 32.1 g/dL (ref 30.0–36.0)
MCV: 78 fL (ref 78.0–100.0)
Platelets: 331 10*3/uL (ref 150.0–400.0)
RBC: 4.69 Mil/uL (ref 3.87–5.11)
RDW: 17.8 % — ABNORMAL HIGH (ref 11.5–15.5)
WBC: 4.7 10*3/uL (ref 4.0–10.5)

## 2023-05-12 LAB — HEMOGLOBIN A1C: Hgb A1c MFr Bld: 6 % (ref 4.6–6.5)

## 2023-05-13 ENCOUNTER — Encounter: Payer: Self-pay | Admitting: Primary Care

## 2023-05-14 NOTE — Progress Notes (Addendum)
 Cardiology Clinic Note   Date: 05/15/2023 ID: Concetta, Guion 20-Dec-1955, MRN [id number]  Primary Cardiologist:  Julien Nordmann, MD  Chief Complaint   Paige Hester is a 68 y.o. female who presents to the clinic today for routine follow up.   Patient Profile   Paige Hester is followed by Dr. Mariah Milling for the history outlined below.      Past medical history significant for: Cardiac murmur. Echo 07/12/2020: EF 60 to 65%.  No RWMA.  Grade II DD.  Normal RV size/function.  Mild LAE. Carotid artery stenosis. Carotid duplex 03/19/2023: Bilateral ICA 1 to 39%.  Bilateral ECA > 50%.  Bilateral subclavian arteries stenotic.  Right vertebral artery demonstrates bidirectional flow. Hypertension. Hyperlipidemia. Lipid panel 11/18/2022: LDL 61, HDL 44, TG 66, total 118. OSA. GERD. Prediabetes. PE/DVT.  In summary, patient was previously seen by Dr. Tenny Craw in October 2014 for cardiac risk stratification.  Carotid bruit was heard on exam.  She underwent carotid ultrasound which showed mild carotid plaque bilaterally.  Echo showed EF 65 to 70%, no RWMA, Grade II DD, mild MR.  Patient establish care with Dr. Mariah Milling on 08/16/2020 for shortness of breath.  Patient was diagnosed with PE and April 2022 after long car drive to [place].  Echo April 2022 showed normal LV/RV function as detailed above.  It was recommended patient utilize compression hose and take Eliquis 2.5 twice daily prophylactically for long car trips or plane rides.  Patient was last seen in the office by Dr. Mariah Milling on 05/14/2022 for routine follow-up.  She was doing well at that time and no changes were made.     History of Present Illness    Today, patient is doing well. Patient denies  orthopnea or PND. She has occasional lower extremity edema that is at its best in the morning and progresses throughout the day. She watches her sodium intake and does not add salt to her food.  No chest pain, pressure, or tightness. No  palpitations.  She has stable dyspnea ever since her PE. She typically notices it with heavier exertion. None with day to day activities. She likes to walk for exercise but has not been participating in that activity for a while. She does perform light to moderate household activities with no difficulty.   Unfortunately, at the beginning of the year she was found to have breast cancer. Thankfully it is stage I. She is undergoing a lumpectomy in [place] on 3/6. Her surgeon requested she have an echo prior to the surgery.     ROS: All other systems reviewed and are otherwise negative except as noted in History of Present Illness.  EKGs/Labs Reviewed    EKG Interpretation Date/Time:  Friday May 15 2023 15:16:50 EST Ventricular Rate:  64 PR Interval:  262 QRS Duration:  70 QT Interval:  440 QTC Calculation: 453 R Axis:   -5  Text Interpretation: Sinus rhythm with 1st degree A-V block Low voltage QRS Septal infarct (cited on or before 11-Jul-2020) When compared with ECG of 05/14/2022 (not in [facility]) No significant change Confirmed by Carlos Levering [id number]) on 05/15/2023 3:28:00 PM   03/26/2023: ALT 14; AST 19 05/11/2023: BUN 10; Creatinine, Ser 0.75; Potassium 3.8; Sodium 143   05/11/2023: Hemoglobin 11.7; WBC 4.7     Physical Exam    VS:  BP 138/60   Pulse 64   Ht 5\' 5"  (1.651 m)   Wt 251 lb 12.8 oz (114.2 kg)   SpO2 99%  BMI 41.90 kg/m  , BMI Body mass index is 41.9 kg/m.  GEN: Well nourished, well developed, in no acute distress. Neck: No JVD or carotid bruits. Cardiac:  RRR. 2/6 systolic murmur radiating into carotids. No rubs or gallops.   Respiratory:  Respirations regular and unlabored. Clear to auscultation without rales, wheezing or rhonchi. GI: Soft, nontender, nondistended. Extremities: Radials/DP/PT 2+ and equal bilaterally. No clubbing or cyanosis. No edema.  Skin: Warm and dry, no rash. Neuro: Strength intact.  Assessment & Plan   Cardiac murmur Echo  April 2022 showed normal LV/RV function, no RWMA, Grade II DD, mild LAE.  Patient reports stabled dyspnea with heavier exertion  but none with day to day activities. She denies lightheadedness, dizziness, near syncope or syncope. 2/6 systolic murmur radiating into carotids.  -Repeat echo.  Carotid artery stenosis/bilateral subclavian artery stenosis Carotid duplex January 2025 showed bilateral ICA 1 to 39%, bilateral ECA > 50%, bilateral subclavian artery stenosis.  Patient denies lightheadedness, dizziness, presyncope or syncope.  -Repeat carotid ultrasound annually recommended.  Hypertension BP today 138/60. No headaches or dizziness reported.  -Continue amlodipine, Hyzaar.  Hyperlipidemia LDL September 2024 61, at goal. -Continue rosuvastatin. -Continue to follow with PCP.  Preoperative cardiovascular risk assessment Patient is pending lumpectomy to be performed in [place] on 3/6. Per patient surgeon is requesting she have an echo prior to surgery. According to the RCRI, patient has a 0.9% risk of MACE. Patient reports activity equivalent to 4.0 METS (performs light to moderate household activities without difficulty).  -Based on ACC/AHA guidelines, Paige Hester would be at acceptable risk for the planned procedure without further cardiovascular testing.  -Will order echo as requested by surgeon (per patient). This has been scheduled for Monday 3/3.   Disposition: Echo. Return in 1 year or sooner as needed.   ADDENDUM 05/18/2023: Echo today showed EF >75%, no RWMA, severe LVH, grade II DD, normal RV size/function, severe LAE, mild to moderate mitral stenosis with trivial MR. This was reviewed by Dr. Mariah Milling who feels patient is safe to proceed with surgery. Patient will be notified.          Signed, Etta Grandchild. Casy Brunetto, DNP, NP-C

## 2023-05-15 ENCOUNTER — Ambulatory Visit: Payer: Medicare Other | Attending: Student | Admitting: Student

## 2023-05-15 ENCOUNTER — Encounter: Payer: Self-pay | Admitting: Student

## 2023-05-15 VITALS — BP 138/60 | HR 64 | Ht 65.0 in | Wt 251.8 lb

## 2023-05-15 DIAGNOSIS — I6523 Occlusion and stenosis of bilateral carotid arteries: Secondary | ICD-10-CM | POA: Diagnosis present

## 2023-05-15 DIAGNOSIS — I771 Stricture of artery: Secondary | ICD-10-CM | POA: Insufficient documentation

## 2023-05-15 DIAGNOSIS — Z01810 Encounter for preprocedural cardiovascular examination: Secondary | ICD-10-CM | POA: Diagnosis present

## 2023-05-15 DIAGNOSIS — E785 Hyperlipidemia, unspecified: Secondary | ICD-10-CM | POA: Diagnosis present

## 2023-05-15 DIAGNOSIS — R011 Cardiac murmur, unspecified: Secondary | ICD-10-CM | POA: Insufficient documentation

## 2023-05-15 DIAGNOSIS — I10 Essential (primary) hypertension: Secondary | ICD-10-CM | POA: Insufficient documentation

## 2023-05-15 NOTE — Patient Instructions (Signed)
 Medication Instructions:  Your Physician recommend you continue on your current medication as directed.    *If you need a refill on your cardiac medications before your next appointment, please call your pharmacy*  Lab Work: None ordered at this time   Testing/Procedures: Your physician has requested that you have an echocardiogram. Echocardiography is a painless test that uses sound waves to create images of your heart. It provides your doctor with information about the size and shape of your heart and how well your heart's chambers and valves are working.   You may receive an ultrasound enhancing agent through an IV if needed to better visualize your heart during the echo. This procedure takes approximately one hour.  There are no restrictions for this procedure.  This will take place at [address]) [address], [place] [address]  Please note: We ask at that you not bring children with you during ultrasound (echo/ vascular) testing. Due to room size and safety concerns, children are not allowed in the ultrasound rooms during exams. Our front office staff cannot provide observation of children in our lobby area while testing is being conducted. An adult accompanying a patient to their appointment will only be allowed in the ultrasound room at the discretion of the ultrasound technician under special circumstances. We apologize for any inconvenience.  Follow-Up: At [facility], you and your health needs are our priority.  As part of our continuing mission to provide you with exceptional heart care, we have created designated Provider Care Teams.  These Care Teams include your primary Cardiologist (physician) and Advanced Practice Providers (APPs -  Physician Assistants and Nurse Practitioners) who all work together to provide you with the care you need, when you need it.  Your next appointment:   1 year(s)  Provider:   You may see Julien Nordmann, MD or  Carlos Levering, NP

## 2023-05-18 ENCOUNTER — Telehealth: Payer: Self-pay | Admitting: Cardiovascular Disease

## 2023-05-18 ENCOUNTER — Encounter (HOSPITAL_COMMUNITY): Payer: Self-pay

## 2023-05-18 ENCOUNTER — Ambulatory Visit (HOSPITAL_COMMUNITY): Payer: Medicare Other | Attending: Student

## 2023-05-18 ENCOUNTER — Ambulatory Visit (HOSPITAL_COMMUNITY)
Admission: RE | Admit: 2023-05-18 | Discharge: 2023-05-18 | Disposition: A | Discharge status: Home / Self Care | Source: Ambulatory Visit | Attending: Student | Admitting: Student

## 2023-05-18 DIAGNOSIS — R011 Cardiac murmur, unspecified: Secondary | ICD-10-CM | POA: Diagnosis not present

## 2023-05-18 LAB — ECHOCARDIOGRAM COMPLETE
AR max vel: 2.2 cm2
AV Area VTI: 2.24 cm2
AV Area mean vel: 2.25 cm2
AV Mean grad: 10.7 mmHg
AV Peak grad: 19.4 mmHg
Ao pk vel: 2.2 m/s
Area-P 1/2: 1.53 cm2
Est EF: >75
MV VTI: 1.76 cm2
S' Lateral: 2.1 cm

## 2023-05-18 NOTE — Progress Notes (Signed)
*  PRELIMINARY RESULTS* Echocardiogram 2D Echocardiogram has been performed.  Paige Hester 05/18/2023, 12:51 PM

## 2023-05-18 NOTE — Telephone Encounter (Signed)
 Please results note on this same day.  Forwarded to pt's MyChart

## 2023-05-18 NOTE — Telephone Encounter (Signed)
 Patient requested for [place] to send the Echo results with the Clearance to the office preforming her procedure. Patient stated Dr. Marca Ancona in [place], [place] is preforming the procedure. Please advise.

## 2023-05-19 ENCOUNTER — Telehealth: Payer: Self-pay | Admitting: Cardiovascular Disease

## 2023-05-19 NOTE — Telephone Encounter (Signed)
 Caller Kendal Hymen) wants to get a copy of patient's last EKG tracing results faxed to fax# [phone].  Caller stated patient is having surgery on Thursday, 3/6.

## 2023-05-19 NOTE — Telephone Encounter (Signed)
  Patient is requesting that her last EKG and her Echo results be faxed over to [facility] to the [facility] at [phone] Attn: Araceli Bouche

## 2023-05-20 NOTE — Telephone Encounter (Signed)
 Paige Hester calling to check on status of request, still has not received EKG, please review, call and update

## 2023-05-21 NOTE — Telephone Encounter (Signed)
 Priscille Loveless, NT  You20 hours ago (5:02 PM)    REQUESTED RECEIVED, REQUESTED RECORDS FAXED 05/20/23

## 2023-05-21 NOTE — Telephone Encounter (Signed)
   Patient Name: Paige Hester  DOB: 1955-09-01 MRN: [id number]  Primary Cardiologist: Julien Nordmann, MD  Chart reviewed as part of pre-operative protocol coverage. Given past medical history and time since last visit, based on ACC/AHA guidelines, Paige Hester is at acceptable risk for the planned procedure without further cardiovascular testing.   Per Dr. Mariah Milling after reviewing latest echo report patient is acceptable risk for procedure with no further cardiovascular testing necessary.  -Please see enclosed copy of echo report with cardiovascular clearance note.  The patient was advised that if she develops new symptoms prior to surgery to contact our office to arrange for a follow-up visit, and she verbalized understanding.  I will route this recommendation to the requesting party via Epic fax function and remove from pre-op pool.  Please call with questions.  Napoleon Form, Leodis Rains, NP 05/21/2023, 7:10 AM

## 2023-06-17 ENCOUNTER — Telehealth: Payer: Self-pay | Admitting: Radiation Oncology

## 2023-06-17 ENCOUNTER — Encounter: Payer: Self-pay | Admitting: *Deleted

## 2023-06-17 NOTE — Telephone Encounter (Signed)
 4/2 @ 10:00 am Received call from patient, would like a follow up consult with Dr. Mitzi Hansen, after her surgery she will have in [place].  No referral received.  Ask patient to reach out to her doctor to send referral and his notes also have recent images push to powershare, before schedule follow up.  Waiting on referral.

## 2023-06-17 NOTE — Progress Notes (Signed)
 Ms. Flesch called and she is back in [place].   They are still waiting on the oncotype results.   She is going to see Dr. Cathie Hoops and Marisue Humble on 4/16.   She had previously met with Dr. Mitzi Hansen at [facility] and would like to have radiation there.   She will call his office to set up the appointment to see Dr. Mitzi Hansen and move forward with radiation planning.

## 2023-06-20 NOTE — Progress Notes (Unsigned)
 Cardiology Clinic Note   Date: 06/20/2023 ID: SAM WUNSCHEL, DOB 29-May-1955, MRN [id number]  Primary Cardiologist:  Julien Nordmann, MD  Chief Complaint   Paige Hester is a 68 y.o. female who presents to the clinic today for ***  Patient Profile   Paige Hester is followed by *** for the history outlined below.       Past medical history significant for: Mitral stenosis. Echo 07/12/2020: EF 60 to 65%.  No RWMA.  Grade II DD.  Normal RV size/function.  Mild LAE.*** Echo 05/18/2023: EF > 75%.  No RWMA.  Severe LVH.  Grade II DD.  Normal RV size/function.  Severe LAE.  Mild to moderate mitral stenosis, mean gradient 5 mmHg.  Severe MAC.  Increased flow velocities may be secondary to anemia, thyrotoxicosis, hyperdynamic or high flow state. Carotid artery stenosis. Carotid duplex 03/19/2023: Bilateral ICA 1 to 39%.  Bilateral ECA > 50%.  Bilateral subclavian arteries stenotic.  Right vertebral artery demonstrates bidirectional flow. Hypertension. Hyperlipidemia. Lipid panel 11/18/2022: LDL 61, HDL 44, TG 66, total 118. OSA. GERD. Prediabetes. PE/DVT. Breast cancer. S/p lumpectomy March 2025.  In summary, patient was previously seen by Dr. Tenny Craw in October 2014 for cardiac risk stratification.  Carotid bruit was heard on exam.  She underwent carotid ultrasound which showed mild carotid plaque bilaterally.  Echo showed EF 65 to 70%, no RWMA, Grade II DD, mild MR.  Patient establish care with Dr. Mariah Milling on 08/16/2020 for shortness of breath.  Patient was diagnosed with PE and April 2022 after long car drive to [place].  Echo April 2022 showed normal LV/RV function as detailed above.  It was recommended patient utilize compression hose and take Eliquis 2.5 twice daily prophylactically for long car trips or plane rides.  Patient was last seen in the office by Dr. Mariah Milling on 05/14/2022 for routine follow-up.  She was doing well at that time and no changes were made.   Patient was last  seen in the office by me on 05/15/2023 for routine follow-up and preoperative risk assessment prior to lumpectomy.  She was doing well at that time.  She reported occasional lower extremity edema best in the morning and progressing throughout the day.  She noted stable dyspnea particularly with heavier exertion ever since PE.  Her echo was updated which showed hyperdynamic LV function as detailed above.     History of Present Illness    Today, patient ***  Mitral stenosis Echo March 2025 showed EF >75%, no RWMA, severe LVH, grade II DD, normal RV size/function, severe LAE, mild to moderate mitral stenosis with trivial MR.  Patient reports stabled dyspnea with heavier exertion  but none with day to day activities.***She denies lightheadedness, dizziness, near syncope or syncope. 2/6 systolic murmur radiating into carotids.*** -***   Carotid artery stenosis/bilateral subclavian artery stenosis Carotid duplex January 2025 showed bilateral ICA 1 to 39%, bilateral ECA > 50%, bilateral subclavian artery stenosis.  Patient denies lightheadedness, dizziness, presyncope or syncope.  -Repeat carotid ultrasound annually recommended.***   Hypertension BP today***. No headaches or dizziness reported.  -Continue amlodipine, Hyzaar.   Hyperlipidemia LDL September 2024 61, at goal. -Continue rosuvastatin. -Continue to follow with PCP.  ROS: All other systems reviewed and are otherwise negative except as noted in History of Present Illness.  EKGs/Labs Reviewed        03/26/2023: ALT 14; AST 19 05/11/2023: BUN 10; Creatinine, Ser 0.75; Potassium 3.8; Sodium 143   05/11/2023: Hemoglobin 11.7; WBC 4.7  No results found for requested labs within last 365 days.   No results found for requested labs within last 365 days.  ***  Risk Assessment/Calculations    {Does this patient have ATRIAL FIBRILLATION?:[phone]} No BP recorded.  {Refresh Note OR Click here to enter BP  :1}***        Physical  Exam    VS:  There were no vitals taken for this visit. , BMI There is no height or weight on file to calculate BMI.  GEN: Well nourished, well developed, in no acute distress. Neck: No JVD or carotid bruits. Cardiac: *** RRR. No murmurs. No rubs or gallops.   Respiratory:  Respirations regular and unlabored. Clear to auscultation without rales, wheezing or rhonchi. GI: Soft, nontender, nondistended. Extremities: Radials/DP/PT 2+ and equal bilaterally. No clubbing or cyanosis. No edema ***  Skin: Warm and dry, no rash. Neuro: Strength intact.  Assessment & Plan   ***  Disposition: ***     {Are you ordering a CV Procedure (e.g. stress test, cath, DCCV, TEE, etc)?   Press F2        :[id number]}   Signed, Etta Grandchild. Deasha Clendenin, DNP, NP-C

## 2023-06-23 ENCOUNTER — Telehealth: Admitting: Obstetrics & Gynecology

## 2023-06-23 DIAGNOSIS — Z975 Presence of (intrauterine) contraceptive device: Secondary | ICD-10-CM | POA: Diagnosis not present

## 2023-06-23 NOTE — Progress Notes (Signed)
 GYNECOLOGY VIRTUAL VISIT ENCOUNTER NOTE  Provider location: Center for [facility] at [facility]   Patient location: Home  I connected with Paige Hester on 06/23/23 at  1:10 PM EDT by MyChart Video Encounter and verified that I am speaking with the correct person using two identifiers.   I discussed the limitations, risks, security and privacy concerns of performing an evaluation and management service virtually and the availability of in person appointments. I also discussed with the patient that there may be a patient responsible charge related to this service. The patient expressed understanding and agreed to proceed.   History:  Paige Hester is a 68 y.o. [phone] female being evaluated today after diagnosis of recurrent hormone positive breast cancer.  She is getting ready for radiation and anti-estrogen therapy, and her oncologist had some concerns about her Mirena, the progestin IUD that has been in place since 2023 for her postmenopausal bleeding and thickened endometrium.  She denies any current abnormal vaginal discharge, bleeding, pelvic pain or other concerns.     Past Medical History:  Diagnosis Date   Allergy    Bilateral swelling of feet and ankles    Family history of colon cancer    Family history of prostate cancer    Gallbladder polyp 2012   GERD (gastroesophageal reflux disease)    Hyperlipidemia    Hypertension    Iron deficiency anemia    Menopausal symptoms    since 2007   Murmur, cardiac    Other fatigue    Prediabetes    Pulmonary embolism (HCC) 06/2020   Shortness of breath    with exertion; patient going to pulmonary rehabilitation   Shortness of breath on exertion    Sleep apnea    Uses CPAP   Vitamin D deficiency    Past Surgical History:  Procedure Laterality Date   BREAST BIOPSY Left 03/17/2023   [place] bx,Ribbon Clip, path pending   BREAST BIOPSY Left 03/17/2023   [place] Bx, Coil Clip,path pending   BREAST BIOPSY Left  03/17/2023   [place] LT BREAST BX W LOC DEV 1ST LESION IMG BX SPEC US GUIDE 03/17/2023 [facility]   BREAST BIOPSY Left 03/17/2023   [place] LT BREAST BX W LOC DEV EA ADD LESION IMG BX SPEC US GUIDE 03/17/2023 [facility]   COLONOSCOPY  2010   2020   HYSTEROSCOPY WITH D & C N/A 07/27/2019   Procedure: DILATATION AND CURETTAGE /HYSTEROSCOPY, Polypectomy;  Surgeon: Tereso Newcomer, MD;  Location: [facility];  Service: Gynecology;  Laterality: N/A;   HYSTEROSCOPY WITH D & C N/A 06/26/2021   Procedure: DILATATION AND CURETTAGE /HYSTEROSCOPY;  Surgeon: Tereso Newcomer, MD;  Location: [facility];  Service: Gynecology;  Laterality: N/A;   INTRAUTERINE DEVICE (IUD) INSERTION N/A 06/26/2021   Procedure: INTRAUTERINE DEVICE (IUD) INSERTION;  Surgeon: Tereso Newcomer, MD;  Location: [facility];  Service: Gynecology;  Laterality: N/A;   The following portions of the patient's history were reviewed and updated as appropriate: allergies, current medications, past family history, past medical history, past social history, past surgical history and problem list.   Health Maintenance:  Normal pap and negative HRHPV on 04/24/2021.   Review of Systems:  Pertinent items noted in HPI and remainder of comprehensive ROS otherwise negative.  Physical Exam:   General:  Alert, oriented and cooperative. Patient appears to be in no acute distress.  Mental Status: Normal mood and affect. Normal behavior. Normal judgment and  thought content.   Respiratory: Normal respiratory effort, no problems with respiration noted  Rest of physical exam deferred due to type of encounter     Assessment and Plan:     1. Mirena IUD (intrauterine device) in place since 06/26/2021 (Primary) Discussed that there was no direct evidence that Mirena IUD causes or worsens recurrence of her breast cancer.  Also reassured that it will not affect her radiation therapy course.  Mirena acts more  locally and has and minimizes systemic levels progesterone.  It is protective against endometrial abnormal cells, which can result from some anti-estrogen therapy used for breast cancer. Given her postmenopausal bleeding and thickened endometrium, I am hesitant to remove the IUD but will follow up her oncologist recommendations regarding this issue. Patient also does not want recurrent of her bleeding and desires the uteroprotective effect of the Mirena.   The only other medication that managed her bleeding iun the past was Megace, but this will lead to higher systemic levels of progesterone and is not recommended.  Patient also does not want to undergo the stress of surgery (hysterectomy) to deal with any recurrent bleeding.   Will follow up Oncology recommendations.   Mirena IUD will remain in place for now.       I discussed the assessment and treatment plan with the patient. The patient was provided an opportunity to ask questions and all were answered. The patient agreed with the plan and demonstrated an understanding of the instructions.   The patient was advised to call back or seek an in-person evaluation/go to the ED if the symptoms worsen or if the condition fails to improve as anticipated.  I provided 25 minutes of face-to-face time during this encounter. I also spent 15 minutes dedicated to the care of this patient including pre-visit review of records, post visit ordering of medications and appropriate tests or procedures, coordinating care and documenting this visit encounter.    Jaynie Collins, MD Center for [facility], [facility]'

## 2023-06-24 NOTE — Progress Notes (Signed)
 Apt rescheduled

## 2023-06-24 NOTE — Progress Notes (Signed)
 Nursing interview for a diagnosis of LT breast cancer.   Patient identity verified x2.   Patient reports: No problems   Pain: None ROM (LT arm): Full Cardiac/Lungs: No issue   Breast surgery- Yes- LT breast healing well from lumpectomy on 05/21/2023.   Patient denies any related issues at this time.   Possible current pregnancy? No- Postmenopausal   Meaningful use complete.   Vitals- ***   This concludes the interaction.  Ruel Favors, LPN

## 2023-06-25 ENCOUNTER — Ambulatory Visit
Admission: RE | Admit: 2023-06-25 | Discharge: 2023-06-25 | Disposition: A | Discharge status: Home / Self Care | Source: Ambulatory Visit | Attending: Radiation Oncology | Admitting: Radiation Oncology

## 2023-06-25 ENCOUNTER — Ambulatory Visit

## 2023-06-25 NOTE — Progress Notes (Incomplete)
 Nursing interview for a diagnosis of LT breast cancer.   Patient identity verified x2.   Patient reports: Doing well.   Pain: No ROM (LT arm): Full Cardiac/Lungs: Yes- Mild SOB   Breast surgery- Yes- LT breast healing well from lumpectomy on 05/21/2023, by Dr. Debara Pickett at [facility].   All related body systems reviewed.  Patient denies any other related issues at this time.  Possible current pregnancy? No- Postmenopausal   Meaningful use complete.   Vitals- BP (!) 189/65 (BP Location: Right Arm, Patient Position: Sitting, Cuff Size: Normal)   Pulse 63   Temp 97.8 F (36.6 C) (Temporal)   Resp 18   Ht 5\' 5"  (1.651 m)   Wt 251 lb (113.9 kg)   SpO2 100%   BMI 41.77 kg/m     This concludes the interaction.  Ruel Favors, LPN

## 2023-06-26 ENCOUNTER — Encounter: Payer: Self-pay | Admitting: Radiology

## 2023-06-26 ENCOUNTER — Ambulatory Visit
Admission: RE | Admit: 2023-06-26 | Discharge: 2023-06-26 | Disposition: A | Discharge status: Home / Self Care | Source: Ambulatory Visit | Attending: Radiation Oncology | Admitting: Radiation Oncology

## 2023-06-26 ENCOUNTER — Ambulatory Visit
Admission: RE | Admit: 2023-06-26 | Discharge: 2023-06-26 | Disposition: A | Discharge status: Home / Self Care | Source: Ambulatory Visit | Attending: Radiology | Admitting: Radiology

## 2023-06-26 ENCOUNTER — Encounter: Payer: Self-pay | Admitting: Student

## 2023-06-26 ENCOUNTER — Ambulatory Visit (INDEPENDENT_AMBULATORY_CARE_PROVIDER_SITE_OTHER): Admitting: Student

## 2023-06-26 ENCOUNTER — Ambulatory Visit: Admitting: Student

## 2023-06-26 VITALS — BP 189/65 | HR 63 | Temp 97.8°F | Resp 18 | Ht 65.0 in | Wt 251.0 lb

## 2023-06-26 VITALS — BP 124/64 | HR 72 | Ht 65.0 in | Wt 251.0 lb

## 2023-06-26 DIAGNOSIS — D509 Iron deficiency anemia, unspecified: Secondary | ICD-10-CM | POA: Insufficient documentation

## 2023-06-26 DIAGNOSIS — G4730 Sleep apnea, unspecified: Secondary | ICD-10-CM | POA: Insufficient documentation

## 2023-06-26 DIAGNOSIS — Z800 Family history of malignant neoplasm of digestive organs: Secondary | ICD-10-CM | POA: Insufficient documentation

## 2023-06-26 DIAGNOSIS — Z87891 Personal history of nicotine dependence: Secondary | ICD-10-CM | POA: Diagnosis not present

## 2023-06-26 DIAGNOSIS — I252 Old myocardial infarction: Secondary | ICD-10-CM | POA: Insufficient documentation

## 2023-06-26 DIAGNOSIS — I2510 Atherosclerotic heart disease of native coronary artery without angina pectoris: Secondary | ICD-10-CM | POA: Insufficient documentation

## 2023-06-26 DIAGNOSIS — I342 Nonrheumatic mitral (valve) stenosis: Secondary | ICD-10-CM | POA: Insufficient documentation

## 2023-06-26 DIAGNOSIS — E559 Vitamin D deficiency, unspecified: Secondary | ICD-10-CM | POA: Insufficient documentation

## 2023-06-26 DIAGNOSIS — C50212 Malignant neoplasm of upper-inner quadrant of left female breast: Secondary | ICD-10-CM | POA: Insufficient documentation

## 2023-06-26 DIAGNOSIS — Z510 Encounter for antineoplastic radiation therapy: Secondary | ICD-10-CM | POA: Diagnosis present

## 2023-06-26 DIAGNOSIS — N6489 Other specified disorders of breast: Secondary | ICD-10-CM | POA: Diagnosis not present

## 2023-06-26 DIAGNOSIS — Z7982 Long term (current) use of aspirin: Secondary | ICD-10-CM | POA: Insufficient documentation

## 2023-06-26 DIAGNOSIS — E785 Hyperlipidemia, unspecified: Secondary | ICD-10-CM | POA: Diagnosis not present

## 2023-06-26 DIAGNOSIS — I771 Stricture of artery: Secondary | ICD-10-CM

## 2023-06-26 DIAGNOSIS — Z79899 Other long term (current) drug therapy: Secondary | ICD-10-CM | POA: Insufficient documentation

## 2023-06-26 DIAGNOSIS — Z170 Estrogen receptor positive status [ER+]: Secondary | ICD-10-CM | POA: Insufficient documentation

## 2023-06-26 DIAGNOSIS — I6523 Occlusion and stenosis of bilateral carotid arteries: Secondary | ICD-10-CM

## 2023-06-26 DIAGNOSIS — I10 Essential (primary) hypertension: Secondary | ICD-10-CM | POA: Insufficient documentation

## 2023-06-26 DIAGNOSIS — Z86711 Personal history of pulmonary embolism: Secondary | ICD-10-CM | POA: Insufficient documentation

## 2023-06-26 NOTE — Addendum Note (Signed)
 Encounter addended by: Erven Colla, PA-C on: 06/26/2023 2:37 PM  Actions taken: Clinical Note Signed

## 2023-06-26 NOTE — Patient Instructions (Signed)
 Medication Instructions:   Your Physician recommend you continue on your current medication as directed.     *If you need a refill on your cardiac medications before your next appointment, please call your pharmacy*  Lab Work:  No labs ordered today   Testing/Procedures: No test ordered today   Follow-Up: At [facility], you and your health needs are our priority.  As part of our continuing mission to provide you with exceptional heart care, our providers are all part of one team.  This team includes your primary Cardiologist (physician) and Advanced Practice Providers or APPs (Physician Assistants and Nurse Practitioners) who all work together to provide you with the care you need, when you need it.  Your next appointment:   6 month(s)  Provider:   You may see Julien Nordmann, MD or one of the following Advanced Practice Providers on your designated Care Team:   Carlos Levering, NP    We recommend signing up for the patient portal called "MyChart".  Sign up information is provided on this After Visit Summary.  MyChart is used to connect with patients for Virtual Visits (Telemedicine).  Patients are able to view lab/test results, encounter notes, upcoming appointments, etc.  Non-urgent messages can be sent to your provider as well.   To learn more about what you can do with MyChart, go to [email or web address].

## 2023-06-26 NOTE — Progress Notes (Addendum)
 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 female seen for follow-up of her left breast cancer. Since she was last seen in our clinic on 04/14/2023, she again met with Dr. Donell Beers on 04/20/2023. Given that her separate tumors spread throughout the central region of the breast, Dr. Donell Beers recommended proceeding with a mastectomy. The patient received second opinion in [place] from Dr. Huntley Dec Fogarty on 04/23/2023. Dr. Debara Pickett offered the patient a lumpectomy with sentinel lymph node biopsy.  The patient decided to proceed with breast conserving surgery with SLN biopsy with reduction mammoplasty under the care of Dr. Debara Pickett and Dr. Noralee Stain on 05/21/2023. Pathology revealed tumor #1 to be 6 mm in size, low grade IDC; tumor #2 to be 8 mm in greatest dimension, also low grade IDC; tumor #3 to be 18 mm in greatest dimension, intermediate grade IDC; tumor #4 to be 2.2 mm in greatest dimension low grade IDC. All tumors were ER/PR+, HER2-, with a Ki-67 of  5-10%. All true margins were negative. A single SLN contained isolated tumor cells.   OncotypeDX was sent on 06/08/2023. Results of this are pending at this time. She will follow-up with Dr. Cathie Hoops for management of her antiestrogen therapy.     HPI from consult on 04/14/2023:  The patient presented in December 2024 with screening mammogram showing a possible distortion in the left breast.  Further workup on 03/04/2023 showed persistence of the mass in the left breast and a focal asymmetry with subtle distortion and microcalcifications in the upper inner left breast.  There was an additional 15 mm mass in the upper outer left breast.  By ultrasound at 12:00 there was a 7 mm mass corresponding to the architectural distortion on prior mammogram, at 11:00 a 16 mm mass consistent with a benign cyst and consistent with the incidentally visualized mass on diagnostic mammogram.  Posterior to the mass by 5 cm there was additional focal asymmetry.  2 biopsies were recommended.  The axillary nodes were not mentioned in the report.  She underwent biopsies on 03/17/2023.  The 12:00 biopsy showed a grade 1 invasive ductal carcinoma, and the 11:00 biopsy showed grade 1 invasive ductal carcinoma.  The 12:00 biopsy showed that the tumor was ER/PR positive, HER2 negative, and the 11:00 specimen was ER/PR positive HER2 negative as well.  She met with Dr. Donell Beers and plans to undergo lumpectomy with sentinel lymph node biopsy.  An MRI on 04/09/23 showed the known biopsy sites in the left breast at 12 and 11 o'clock,  but also a meass in the 12:00 position inferior to the other biopsy site measuring 1.5 cm, and two 4 mm indeterminate masses in the upper central left breast. It is recommended that she have biopsy of these separate areas. She met with Dr. Cathie Hoops who recommends Oncotype DX.  She is seen today to discuss adjuvant radiation at the appropriate time.    PREVIOUS RADIATION THERAPY: No   PAST MEDICAL HISTORY:  Past Medical  History:  Diagnosis Date   Allergy    Bilateral swelling of feet and ankles    Family history of colon cancer    Family history of prostate cancer    Gallbladder polyp 2012   GERD (gastroesophageal reflux disease)    Hyperlipidemia    Hypertension    Iron deficiency anemia    Menopausal symptoms    since 2007   Murmur, cardiac    Other fatigue    Prediabetes    Pulmonary embolism (HCC) 06/2020   Shortness of breath    with exertion; patient going to pulmonary rehabilitation   Shortness of breath on exertion    Sleep apnea    Uses CPAP   Vitamin D deficiency        PAST SURGICAL HISTORY: Past Surgical History:  Procedure Laterality Date   BREAST BIOPSY Left 03/17/2023   [place] bx,Ribbon Clip, path pending   BREAST BIOPSY Left 03/17/2023   [place] Bx, Coil Clip,path pending   BREAST BIOPSY Left 03/17/2023   [place] LT BREAST BX W LOC DEV 1ST LESION IMG BX SPEC US GUIDE 03/17/2023 [facility]   BREAST BIOPSY Left 03/17/2023   [place] LT BREAST BX W LOC DEV EA ADD LESION IMG BX SPEC US GUIDE 03/17/2023 [facility]   COLONOSCOPY  2010   2020   HYSTEROSCOPY WITH D & C N/A 07/27/2019   Procedure: DILATATION AND CURETTAGE /HYSTEROSCOPY, Polypectomy;  Surgeon: Tereso Newcomer, MD;  Location: [facility];  Service: Gynecology;  Laterality: N/A;   HYSTEROSCOPY WITH D & C N/A 06/26/2021   Procedure: DILATATION AND CURETTAGE /HYSTEROSCOPY;  Surgeon: Tereso Newcomer, MD;  Location: [facility];  Service: Gynecology;  Laterality: N/A;   INTRAUTERINE DEVICE (IUD) INSERTION N/A 06/26/2021   Procedure: INTRAUTERINE DEVICE (IUD) INSERTION;  Surgeon: Tereso Newcomer, MD;  Location: [facility];  Service: Gynecology;  Laterality: N/A;     FAMILY HISTORY:  Family History  Problem Relation Age of Onset   Depression Mother    Stroke Mother    Sudden death Mother    Heart disease Mother    Hypertension Mother    Heart attack Mother 80   Cancer  Father    Prostate cancer Father        metastatic   Heart disease Father 70       CAD, STENT   Coronary artery disease Sister        stent x 2   Coronary artery disease Sister 11       stent placed   Hypertension Sister    Hypertension Sister    Heart disease Brother        Myocardial infarction   Colon cancer Maternal Aunt    Sudden death Other    Depression Other    Colon polyps Neg Hx    Esophageal cancer Neg Hx    Rectal cancer Neg Hx    Stomach cancer Neg Hx    Breast cancer Neg Hx      SOCIAL HISTORY:  reports that she quit smoking about 26 years ago. Her smoking use included cigarettes. She started smoking about 53 years ago. She has a 27 pack-year smoking history. She has never used smokeless tobacco. She reports current alcohol use of about 7.0 standard drinks of alcohol per week. She reports that she does not use drugs. The patient is separated and lives in [place]. She is a retired Dietitian. She has a son that lives in [place].    ALLERGIES: Patient has no known allergies.   MEDICATIONS:  Current Outpatient Medications  Medication Sig Dispense Refill   albuterol (VENTOLIN HFA) 108 (90 Base) MCG/ACT inhaler Inhale 2 puffs into the lungs every 6 (six) hours as needed for wheezing or shortness of breath. 8 g 2   amLODipine (NORVASC) 10 MG tablet Take 1 tablet (10 mg total) by mouth daily. for blood pressure. 14 tablet 0   aspirin EC 81 MG tablet Take 81 mg by mouth daily. Swallow whole.     Cholecalciferol (VITAMIN D3) 1000 UNITS tablet Take 1,000 Units by mouth daily.     fluticasone (FLONASE) 50 MCG/ACT nasal spray Place 1 spray into both nostrils 2 (two) times daily as needed for allergies or rhinitis. 16 g 0   Iron-Vitamin C 65-125 MG TABS Take 1 tablet by mouth daily. 30 tablet 2   loratadine (CLARITIN) 10 MG tablet Take 10 mg by mouth daily.     losartan-hydrochlorothiazide (HYZAAR) 100-25 MG tablet Take 1 tablet by mouth daily. .for blood pressure. 14 tablet 0    Melatonin 10 MG TABS Take by mouth as needed.     omeprazole (PRILOSEC) 20 MG capsule Take 1 capsule (20 mg total) by mouth daily. for heartburn. 14 capsule 0   Oyster Shell (OYSTER CALCIUM) 500 MG TABS tablet Take 600 mg of elemental calcium by mouth 2 (two) times daily.     Potassium Bicarbonate 99 MG CAPS Take 1 capsule (99 mg total) by mouth in the morning and at bedtime. 180 capsule 1   rosuvastatin (CRESTOR) 20 MG tablet Take 1 tablet (20 mg total) by mouth daily. for cholesterol. 14 tablet 0   No current facility-administered medications for this encounter.     REVIEW OF SYSTEMS: On review of systems, the patient reports that she is doing well overall. She is experiencing "twinges" of breast pain, but otherwise denies any breast pain, arm swelling, or issues with range of motion in her left shoulder.     PHYSICAL EXAM:  Wt Readings from Last 3 Encounters:  06/26/23 251 lb (113.9 kg)  06/26/23 251 lb (113.9 kg)  05/15/23 251 lb 12.8 oz (114.2 kg)   Temp Readings from Last 3 Encounters:  06/26/23 97.8 F (36.6 C) (Temporal)  05/11/23 98.1 F (36.7 C) (Temporal)  04/14/23 97.8 F (36.6 C)   BP Readings from Last 3 Encounters:  06/26/23 (!) 189/65  06/26/23 124/64  05/15/23 138/60   Pulse Readings from Last 3 Encounters:  06/26/23 63  06/26/23 72  05/15/23 64    In general this is a well appearing African American female in no acute distress. She's alert and oriented x4 and appropriate throughout the examination. Cardiopulmonary assessment is negative for acute distress and she exhibits normal effort. Full ROM in her left shoulder.   Left breast: Lumpectomy incision with well-approximated edges and no signs of infection or delayed wound healing. Scar tissue palpated beneath. Mammoplasty incisions also show no sign of delayed healing.      ECOG = 0  0 - Asymptomatic (Fully active, able to carry on all predisease activities without restriction)  1 - Symptomatic but  completely ambulatory (Restricted in physically strenuous activity but ambulatory and able to carry out work of a light or sedentary nature. For example, light housework, office work)  2 - Symptomatic, <50% in bed during the day (Ambulatory and capable of all self care but unable to carry out any work activities. Up and about more than 50% of waking hours)  3 - Symptomatic, >50% in bed, but not bedbound (Capable of only limited self-care, confined to bed or chair 50% or more of waking hours)  4 - Bedbound (Completely disabled. Cannot carry on any self-care. Totally confined to bed or chair)  5 - Death   Santiago Glad MM, Creech RH, Tormey DC, et al. [phone]). "Toxicity and response criteria of the [facility] Group". Am. Evlyn Clines. Oncol. 5 (6): 649-55    LABORATORY DATA:  Lab Results  Component Value Date   WBC 4.7 05/11/2023   HGB 11.7 (L) 05/11/2023   HCT 36.6 05/11/2023   MCV 78.0 05/11/2023   PLT 331.0 05/11/2023   Lab Results  Component Value Date   NA 143 05/11/2023   K 3.8 05/11/2023   CL 102 05/11/2023   CO2 32 05/11/2023   Lab Results  Component Value Date   ALT 14 03/26/2023   AST 19 03/26/2023   ALKPHOS 68 03/26/2023   BILITOT 0.5 03/26/2023      RADIOGRAPHY: No results found.      IMPRESSION/PLAN: 1. 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   Patient appears to be healing well from her lumpectomy. The results of her OncotypeDX testing are currently pending. She is scheduled for her CT simulation later today and would like to proceed. She understands that if her OncotypeDX indicate a role for chemotherapy, we would need to redo the simulation.   We have reviewed surgical pathology and Dr. Mitzi Hansen recommends a 6.5-week course of radiation to the left breast and surrounding lymph nodes to reduce the risk of locoregional disease recurrence. Today we discussed the natural course of early stage breast  disease. We highlighted the role of radiotherapy in the management and focused on the details of logistics and delivery. We reviewed the anticipated acute and late sequelae associated with radiation in this setting. The patient was encouraged to ask questions that I answered to the best of my ability. Patient expressed readiness to proceed with treatment. A consent form was reviewed and signed today.    Plan:  Patient is scheduled for CT simulation later today and would like to proceed. She understands that if her OncotypeDX indicate a role for chemotherapy, we will need to postpone treatment which may result in having to redo her simulation. She will call us when she has been made aware of her OncotypeDX results so we can proceed with treatment accordingly.    In a visit lasting 30 minutes, greater than 50% of the time was spent face to face reviewing her case, as well as in preparation of, discussing, and coordinating the patient's care.  The above documentation reflects my direct findings during this shared patient visit. Please see the separate note by Dr. Mitzi Hansen on this date for the remainder of the patient's plan of care.    Bryan Lemma, PA-C     **Disclaimer: This note was dictated with voice recognition software. Similar sounding words can inadvertently be transcribed  and this note may contain transcription errors which may not have been corrected upon publication of note.**

## 2023-06-30 ENCOUNTER — Other Ambulatory Visit: Payer: Self-pay | Admitting: Cardiovascular Disease

## 2023-06-30 DIAGNOSIS — E782 Mixed hyperlipidemia: Secondary | ICD-10-CM

## 2023-07-01 ENCOUNTER — Inpatient Hospital Stay: Payer: Medicare Other | Admitting: Occupational Therapy

## 2023-07-01 ENCOUNTER — Encounter: Payer: Self-pay | Admitting: Oncology

## 2023-07-01 ENCOUNTER — Inpatient Hospital Stay: Payer: Medicare Other | Attending: Oncology | Admitting: Oncology

## 2023-07-01 VITALS — BP 134/54 | HR 67 | Temp 96.9°F | Resp 18 | Wt 250.1 lb

## 2023-07-01 DIAGNOSIS — C50212 Malignant neoplasm of upper-inner quadrant of left female breast: Secondary | ICD-10-CM | POA: Diagnosis present

## 2023-07-01 DIAGNOSIS — Z800 Family history of malignant neoplasm of digestive organs: Secondary | ICD-10-CM | POA: Insufficient documentation

## 2023-07-01 DIAGNOSIS — C50919 Malignant neoplasm of unspecified site of unspecified female breast: Secondary | ICD-10-CM

## 2023-07-01 DIAGNOSIS — M8580 Other specified disorders of bone density and structure, unspecified site: Secondary | ICD-10-CM | POA: Diagnosis not present

## 2023-07-01 DIAGNOSIS — Z8042 Family history of malignant neoplasm of prostate: Secondary | ICD-10-CM | POA: Insufficient documentation

## 2023-07-01 DIAGNOSIS — Z87891 Personal history of nicotine dependence: Secondary | ICD-10-CM | POA: Diagnosis not present

## 2023-07-01 DIAGNOSIS — Z1732 Human epidermal growth factor receptor 2 negative status: Secondary | ICD-10-CM | POA: Diagnosis not present

## 2023-07-01 DIAGNOSIS — Z86711 Personal history of pulmonary embolism: Secondary | ICD-10-CM | POA: Diagnosis not present

## 2023-07-01 DIAGNOSIS — Z170 Estrogen receptor positive status [ER+]: Secondary | ICD-10-CM | POA: Insufficient documentation

## 2023-07-01 DIAGNOSIS — Z1721 Progesterone receptor positive status: Secondary | ICD-10-CM | POA: Insufficient documentation

## 2023-07-01 NOTE — Assessment & Plan Note (Signed)
 Recommend calcium and vitamin D supplementation.

## 2023-07-01 NOTE — Assessment & Plan Note (Signed)
 Provoked by OCP use.  Currently not on anticoagulation.

## 2023-07-01 NOTE — Progress Notes (Signed)
 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 Status post left breast lumpectomy and bilateral breast reduction. Oncotype DX sent on largest breast mass came back at 9. Assuming pathological morphology characteristics are similar among 4 breast masses. There is no benefit for adjuvant chemotherapy. Patient has an appointment with oncologist in [facility] Dr. Achilles Holes for further discussion about her surgical pathology results.  I will defer the decision of adding additional Oncotype DX on other breast masses to Dr. Achilles Holes.  Recommend adjuvant radiation followed by adjuvant endocrine therapy with aromatase inhibitor.     History of pulmonary embolism Provoked by OCP use.  Currently not on anticoagulation.  Osteopenia Recommend calcium and vitamin D supplementation.    Orders Placed This Encounter  Procedures   CBC with Differential (Cancer Center Only)    Standing Status:   Future    Expected Date:   09/16/2023    Expiration Date:   06/30/2024   CMP (Cancer Center only)    Standing Status:   Future    Expected Date:   09/16/2023    Expiration Date:   06/30/2024   Recommend patient to return 1 to 2 weeks after she  finishes radiation.  All questions were answered. The patient knows to call the clinic with any problems, questions or concerns.  Timmy Forbes, MD, PhD [facility] Hematology Oncology 07/01/2023    HISTORY OF PRESENTING ILLNESS:  Paige Hester 68 y.o. female presents to establish care for left breast cancer I have reviewed her chart and materials related to her cancer extensively and collaborated history with the patient. Summary of oncologic history is as follows: Oncology History  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)  02/26/2023 Mammogram   Bilateral screening mammogram  In the left breast, possible distortion warrants further evaluation. In the right breast, no findings suspicious for malignancy.     03/04/2023 Mammogram   Bilateral diagnostic mammogram showed  1. Left breast 7 mm irregular hypoechoic mass in the 12 o'clock position is suspicious for malignancy. Recommend further assessment with ultrasound-guided biopsy. 2. Additional focal asymmetry with subtle distortion in the upper inner left breast posterior depth, located approximately 5.0 cm posterior to the mass, is also suspicious for malignancy. Recommend further assessment with stereotactic guided biopsy. 3. Incidentally visualized benign cyst in the left breast 11 o'clock position.   RECOMMENDATION: 1. Left breast ultrasound-guided biopsy (1 site). 2. Left breast stereotactic guided biopsy (1 site).   03/17/2023 Initial Diagnosis   Invasive carcinoma of breast [facility])  Patient underwent left breast biopsy 1. Breast, left, needle core biopsy, 12 o'clock, 7cmfn (ribbon clip) :      - INVASIVE DUCTAL CARCINOMA, SEE NOTE      - TUBULE FORMATION: SCORE  2      - NUCLEAR PLEOMORPHISM: SCORE 2      - MITOTIC COUNT: SCORE 1      - TOTAL SCORE: 5      - OVERALL GRADE: 1      - LYMPHOVASCULAR INVASION: NOT IDENTIFIED      - CANCER LENGTH: 5.5 MM      - CALCIFICATIONS:  PRESENT       2. Breast, left, needle core biopsy, 11 o'clock, 15cmfn (coil clip) :      - INVASIVE DUCTAL CARCINOMA, SEE NOTE      - TUBULE FORMATION: SCORE 2      - NUCLEAR PLEOMORPHISM: SCORE 2      - MITOTIC COUNT: SCORE 1      - TOTAL SCORE: 5      - OVERALL GRADE: 1      - LYMPHOVASCULAR INVASION: NOT IDENTIFIED      - CANCER LENGTH: 4 MM      - CALCIFICATIONS: NOT IDENTIFIED       Diagnosis Note : - 2.Dr.Patrick reviewed the case and concurs with the      interpretations.Immunohistochemical staining for myoepithelial markers      (calponin, smooth muscle myosin and p63) is performed on blocks 1 and 2.The      myoepithelial markers are focally lost which is consistent with the above      diagnoses.       Block 1 ER 99% positive, PR 40% positive, HER2 IHC 2+, FISH negative.        Block 2 ER 90% positive, PR 70% positive, HER2 IHC 2+, FISH negative [result in image view]     03/26/2023 Cancer Staging   Staging form: Breast, AJCC 8th Edition - Clinical stage from 03/26/2023: Stage IA (cT1b, cN0, cM0, G1, ER+, PR+, HER2-) - Signed by Timmy Forbes, MD on 03/26/2023 Stage prefix: Initial diagnosis Histologic grading system: 3 grade system   03/30/2023 Procedure   MRI guided core needle biopsy of the left breast showed1. Breast, left, needle core biopsy, superior central (barbell clip) :      INVASIVE MAMMARY CARCINOMA, SEE NOTE      TUBULE FORMATION: SCORE 3      NUCLEAR PLEOMORPHISM: SCORE 2      MITOTIC COUNT: SCORE 1      TOTAL SCORE: 6      OVERALL GRADE: 2      LYMPHOVASCULAR INVASION: NOT IDENTIFIED      CANCER LENGTH: 0.6 CM      CALCIFICATIONS: NOT IDENTIFIED      OTHER FINDINGS: NONE      ER 100%, PR 2% HER2 1+ negative.        2. Breast, left, needle core biopsy, superior anterior (cylinder clip) :      INVASIVE MAMMARY CARCINOMA, SEE NOTE      TUBULE FORMATION: SCORE 3      NUCLEAR PLEOMORPHISM: SCORE 2      MITOTIC COUNT: SCORE 1      TOTAL SCORE: 6      OVERALL  GRADE: 2      LYMPHOVASCULAR INVASION: NOT IDENTIFIED      CANCER LENGTH: 0.4      CALCIFICATIONS: NOT IDENTIFIED      OTHER FINDINGS: NONE       ER 95%, PR 30% HER2 1+ negative.     04/03/2023 Genetic Testing   Negative genetic testing on the BRCA STAT Panel through [facility]. The Common Hereditary Cancer + RNA  panel was also negative. The report dates are 04/03/2023 and April 08, 2023  The STAT Breast cancer panel offered by [facility] includes sequencing and rearrangement analysis for the following 9 genes:  ATM, BRCA1, BRCA2, CDH1, CHEK2, PALB2, PTEN, STK11 and TP53.   The Common Hereditary Gene Panel offered by [facility] includes sequencing and/or deletion duplication testing of the following 48 genes: APC, ATM, AXIN2, BAP1, BARD1, BMPR1A, BRCA1, BRCA2, BRIP1, CDH1, CDK4, CDKN2A (p14ARF), CDKN2A (p16INK4a), CHEK2, CTNNA1, DICER1, EPCAM (Deletion/duplication testing only), GREM1 (promoter region deletion/duplication testing only), KIT, MEN1, MLH1, MSH2, MSH3, MSH6, MUTYH, NBN, NF1, NHTL1, PALB2, PDGFRA, PMS2, POLD1, POLE, PTEN, RAD50, RAD51C, RAD51D, SDHB, SDHC, SDHD, SMAD4, SMARCA4. STK11, TP53, TSC1, TSC2, and VHL.  The following genes were evaluated for sequence changes only: SDHA and HOXB13 c.251G>A variant only.     04/09/2023 Imaging   MRI breast with and without contrast showed 1. Two sites of biopsy proven malignancy in the left breast, with the mass at the 12 o'clock position measuring 1.2 x 0.9 x 0.7 cm and the mass/enhancement at the 11 o'clock position posterior depth measuring up to 1.5 cm.   2. Additional 1.4 cm suspicious mass in the upper central left breast (image 79) located inferior to the biopsy proven malignancy at the 12 o'clock position.   3. Two 0.4 cm indeterminate masses in the upper central left breast anterior to mid depth (image 84).   05/21/2023 Surgery   Patient underwent left lumpectomy and sentinel lymph node biopsy.   05/21/2023 Surgery   Patient went to great  [facility] for second opinion for breast surgery and the preferred breast preserving surgery.  Patient underwent left lumpectomy and sentinel lymph node biopsy on 05/21/2023. Tumor 1: 6 mm Grade 1 ER+ (>75%, strong), PR+ (>75%, strong), HER2 negative (IHC 1+) invasive ductal carcinoma. Ki-67 5% Tumor 2: 8 mm Grade 1 ER+ (>75%, strong), PR+ (>75%, strong), HER2 negative (IHC 2+ and FISH negative) invasive ductal carcinoma. Ki-6 5-10% Tumor 3: 18 mm Grade 2 ER+ (>75%, strong), PR+ (1-25%, weak) HER2 negative (IHC 1+) invasive lobular carcinoma. Ki-67 5-10% Tumor 4: 2.2 mm Grade 1 ER+ (>75%, strong), PR+ (>75%, strong) HER2 negative (IHC 2+ and FISH negative) invasive ductal carcinoma. Ki-67 5-10%  Associated Grade 2 DCIS. All true margins negative, No LVI, Single SLN involved, contains isolated tumor cells (0.16 mm). No extra nodal extention.   OncotypeDX has been sent on the 18 mm tumor and is 9    Today patient presents for post surgery evaluation. She is going to start adjuvant radiation at [facility] She reports recovering well from surgery.  Denies any fever, chills.    MEDICAL HISTORY:  Past Medical History:  Diagnosis Date   Allergy    Bilateral swelling of feet and ankles    Family history of colon cancer    Family history of prostate cancer    Gallbladder polyp 2012   GERD (gastroesophageal reflux disease)    Hyperlipidemia    Hypertension    Iron deficiency anemia    Menopausal symptoms    since 2007   Murmur, cardiac    Other fatigue    Prediabetes    Pulmonary embolism (HCC) 06/2020   Shortness of breath    with exertion; patient going to pulmonary rehabilitation   Shortness of breath on exertion    Sleep apnea    Uses CPAP   Vitamin D deficiency     SURGICAL HISTORY: Past Surgical History:  Procedure Laterality Date  BREAST BIOPSY Left 03/17/2023   [place] bx,Ribbon Clip, path pending   BREAST BIOPSY Left 03/17/2023   [place] Bx, Coil Clip,path pending    BREAST BIOPSY Left 03/17/2023   [place] LT BREAST BX W LOC DEV 1ST LESION IMG BX SPEC US GUIDE 03/17/2023 [facility]   BREAST BIOPSY Left 03/17/2023   [place] LT BREAST BX W LOC DEV EA ADD LESION IMG BX SPEC US GUIDE 03/17/2023 [facility]   COLONOSCOPY  2010   2020   HYSTEROSCOPY WITH D & C N/A 07/27/2019   Procedure: DILATATION AND CURETTAGE /HYSTEROSCOPY, Polypectomy;  Surgeon: Tereso Newcomer, MD;  Location: [facility];  Service: Gynecology;  Laterality: N/A;   HYSTEROSCOPY WITH D & C N/A 06/26/2021   Procedure: DILATATION AND CURETTAGE /HYSTEROSCOPY;  Surgeon: Tereso Newcomer, MD;  Location: [facility];  Service: Gynecology;  Laterality: N/A;   INTRAUTERINE DEVICE (IUD) INSERTION N/A 06/26/2021   Procedure: INTRAUTERINE DEVICE (IUD) INSERTION;  Surgeon: Tereso Newcomer, MD;  Location: [facility];  Service: Gynecology;  Laterality: N/A;    SOCIAL HISTORY: Social History   Socioeconomic History   Marital status: Legally Separated    Spouse name: Not on file   Number of children: 1   Years of education: Not on file   Highest education level: Bachelor's degree (e.g., BA, AB, BS)  Occupational History   Occupation: Retired  Tobacco Use   Smoking status: Former    Current packs/day: 0.00    Average packs/day: 1 pack/day for 27.0 years (27.0 ttl pk-yrs)    Types: Cigarettes    Start date: 65    Quit date: 1999    Years since quitting: 26.3   Smokeless tobacco: Never  Vaping Use   Vaping status: Never Used  Substance and Sexual Activity   Alcohol use: Yes    Alcohol/week: 7.0 standard drinks of alcohol    Types: 7 Glasses of wine per week    Comment: 1-2 glasses of wine per night   Drug use: No   Sexual activity: Yes  Other Topics Concern   Not on file  Social History Narrative   Married.   1 child, 1 grandchild.   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   Enjoys going to [employer], singing.    Social Drivers of Health   Financial Resource Strain: High Risk (03/19/2023)   Overall Financial Resource Strain (CARDIA)    Difficulty of Paying Living Expenses: Hard  Food Insecurity: Food Insecurity Present (04/14/2023)   Hunger Vital Sign    Worried About Running Out of Food in the Last Year: Sometimes true    Ran Out of Food in the Last Year: Sometimes true  Transportation Needs: No Transportation Needs (04/14/2023)   PRAPARE - Administrator, Civil Service (Medical): No    Lack of Transportation (Non-Medical): No  Physical Activity: Inactive (03/19/2023)   Exercise Vital Sign    Days of Exercise per Week: 0 days    Minutes of Exercise per Session: 10 min  Stress: No Stress Concern Present (03/19/2023)   [employer] of Occupational Health - Occupational Stress Questionnaire    Feeling of Stress : Not at all  Social Connections: Moderately Integrated (03/19/2023)   Social Connection and Isolation Panel [NHANES]    Frequency of Communication with Friends and Family: More than three times a week    Frequency of Social Gatherings with Friends and Family: Three times a  week    Attends Religious Services: More than 4 times per year    Active Member of Clubs or Organizations: Yes    Attends Banker Meetings: More than 4 times per year    Marital Status: Separated  Intimate Partner Violence: Not At Risk (04/14/2023)   Humiliation, Afraid, Rape, and Kick questionnaire    Fear of Current or Ex-Partner: No    Emotionally Abused: No    Physically Abused: No    Sexually Abused: No    FAMILY HISTORY: Family History  Problem Relation Age of Onset   Depression Mother    Stroke Mother    Sudden death Mother    Heart disease Mother    Hypertension Mother    Heart attack Mother 25   Cancer Father    Prostate cancer Father        metastatic   Heart disease Father 65       CAD, STENT   Coronary artery disease Sister        stent x 2    Coronary artery disease Sister 54       stent placed   Hypertension Sister    Hypertension Sister    Heart disease Brother        Myocardial infarction   Colon cancer Maternal Aunt    Sudden death Other    Depression Other    Colon polyps Neg Hx    Esophageal cancer Neg Hx    Rectal cancer Neg Hx    Stomach cancer Neg Hx    Breast cancer Neg Hx     ALLERGIES:  has no known allergies.  MEDICATIONS:  Current Outpatient Medications  Medication Sig Dispense Refill   albuterol (VENTOLIN HFA) 108 (90 Base) MCG/ACT inhaler Inhale 2 puffs into the lungs every 6 (six) hours as needed for wheezing or shortness of breath. 8 g 2   amLODipine (NORVASC) 10 MG tablet Take 1 tablet (10 mg total) by mouth daily. for blood pressure. 14 tablet 0   aspirin EC 81 MG tablet Take 81 mg by mouth daily. Swallow whole.     Cholecalciferol (VITAMIN D3) 1000 UNITS tablet Take 1,000 Units by mouth daily.     Iron-Vitamin C 65-125 MG TABS Take 1 tablet by mouth daily. 30 tablet 2   loratadine (CLARITIN) 10 MG tablet Take 10 mg by mouth daily.     losartan-hydrochlorothiazide (HYZAAR) 100-25 MG tablet Take 1 tablet by mouth daily. .for blood pressure. 14 tablet 0   Melatonin 10 MG TABS Take by mouth as needed.     omeprazole (PRILOSEC) 20 MG capsule Take 1 capsule (20 mg total) by mouth daily. for heartburn. 14 capsule 0   Oyster Shell (OYSTER CALCIUM) 500 MG TABS tablet Take 600 mg of elemental calcium by mouth 2 (two) times daily.     Potassium Bicarbonate 99 MG CAPS Take 1 capsule (99 mg total) by mouth in the morning and at bedtime. 180 capsule 1   rosuvastatin (CRESTOR) 20 MG tablet Take 1 tablet by mouth once daily 90 tablet 1   fluticasone (FLONASE) 50 MCG/ACT nasal spray Place 1 spray into both nostrils 2 (two) times daily as needed for allergies or rhinitis. (Patient not taking: Reported on 07/01/2023) 16 g 0   No current facility-administered medications for this visit.    Review of Systems   Constitutional:  Negative for appetite change, chills, fatigue and fever.  HENT:   Negative for hearing loss and voice change.  Eyes:  Negative for eye problems.  Respiratory:  Negative for chest tightness and cough.   Cardiovascular:  Negative for chest pain.  Gastrointestinal:  Negative for abdominal distention, abdominal pain and blood in stool.  Endocrine: Negative for hot flashes.  Genitourinary:  Negative for difficulty urinating and frequency.   Musculoskeletal:  Negative for arthralgias.  Skin:  Negative for itching and rash.  Neurological:  Negative for extremity weakness.  Hematological:  Negative for adenopathy.  Psychiatric/Behavioral:  Negative for confusion.      PHYSICAL EXAMINATION: Vitals:   07/01/23 1416  BP: (!) 134/54  Pulse: 67  Resp: 18  Temp: (!) 96.9 F (36.1 C)   Filed Weights   07/01/23 1416  Weight: 250 lb 1.6 oz (113.4 kg)    Physical Exam Constitutional:      General: She is not in acute distress.    Appearance: She is obese. She is not diaphoretic.  HENT:     Head: Normocephalic and atraumatic.  Eyes:     General: No scleral icterus. Cardiovascular:     Rate and Rhythm: Normal rate and regular rhythm.     Heart sounds: Murmur heard.  Pulmonary:     Effort: Pulmonary effort is normal. No respiratory distress.  Abdominal:     General: Bowel sounds are normal. There is no distension.     Palpations: Abdomen is soft.     Tenderness: There is no abdominal tenderness.  Musculoskeletal:        General: Normal range of motion.     Cervical back: Normal range of motion and neck supple.  Skin:    Findings: No erythema.  Neurological:     Mental Status: She is alert and oriented to person, place, and time. Mental status is at baseline.     Motor: No abnormal muscle tone.  Psychiatric:        Mood and Affect: Mood and affect normal.     Left breast status postlumpectomy with well-healed scar. Status post bilateral breast  reduction  LABORATORY DATA:  I have reviewed the data as listed    Latest Ref Rng & Units 05/11/2023    3:27 PM 03/26/2023    4:15 PM 06/26/2021   12:15 PM  CBC  WBC 4.0 - 10.5 K/uL 4.7  11.0    Hemoglobin 12.0 - 15.0 g/dL 40.9  81.1  91.4   Hematocrit 36.0 - 46.0 % 36.6  33.9  37.0   Platelets 150.0 - 400.0 K/uL 331.0  437        Latest Ref Rng & Units 05/11/2023    3:27 PM 03/26/2023    4:15 PM 11/18/2022    3:06 PM  CMP  Glucose 70 - 99 mg/dL 93  782  88   BUN 6 - 23 mg/dL 10  15  12    Creatinine 0.40 - 1.20 mg/dL 9.56  2.13  0.86   Sodium 135 - 145 mEq/L 143  140  141   Potassium 3.5 - 5.1 mEq/L 3.8  3.3  3.7   Chloride 96 - 112 mEq/L 102  105  100   CO2 19 - 32 mEq/L 32  27  32   Calcium 8.4 - 10.5 mg/dL 57.8  9.9  46.9   Total Protein 6.5 - 8.1 g/dL  7.9  7.2   Total Bilirubin 0.0 - 1.2 mg/dL  0.5  0.5   Alkaline Phos 38 - 126 U/L  68  74   AST 15 - 41 U/L  19  18   ALT 0 - 44 U/L  14  10      RADIOGRAPHIC STUDIES: I have personally reviewed the radiological images as listed and agreed with the findings in the report. No results found.

## 2023-07-01 NOTE — Assessment & Plan Note (Addendum)
 Left multifocal stage I pT1c (m) pN0 (i+)  ER/PR positive HER2 negative invasive ductal carcinoma. Status post left breast lumpectomy and bilateral breast reduction. Oncotype DX sent on largest breast mass came back at 9. Assuming pathological morphology characteristics are similar among 4 breast masses. There is no benefit for adjuvant chemotherapy. Patient has an appointment with oncologist in [facility] Dr. Achilles Holes for further discussion about her surgical pathology results.  I will defer the decision of adding additional Oncotype DX on other breast masses to Dr. Achilles Holes.  Recommend adjuvant radiation followed by adjuvant endocrine therapy with aromatase inhibitor.

## 2023-07-03 ENCOUNTER — Telehealth: Payer: Self-pay | Admitting: *Deleted

## 2023-07-03 NOTE — Telephone Encounter (Signed)
 Robert  MD called and wants to get in touch with Dr. Wilhelmenia Harada about the patient. I told him that she has left for today to see her parents , I gave the telephone number of Dr Wilhelmenia Harada to him. I sent secure chat also to Dr. Wilhelmenia Harada and Amos Balint her his number

## 2023-07-07 ENCOUNTER — Ambulatory Visit: Admitting: Obstetrics & Gynecology

## 2023-07-08 ENCOUNTER — Ambulatory Visit
Admission: RE | Admit: 2023-07-08 | Discharge: 2023-07-08 | Disposition: A | Discharge status: Home / Self Care | Source: Ambulatory Visit | Attending: Radiation Oncology | Admitting: Radiation Oncology

## 2023-07-08 ENCOUNTER — Other Ambulatory Visit: Payer: Self-pay

## 2023-07-08 ENCOUNTER — Inpatient Hospital Stay: Admitting: Occupational Therapy

## 2023-07-08 ENCOUNTER — Telehealth: Payer: Self-pay | Admitting: *Deleted

## 2023-07-08 DIAGNOSIS — Z510 Encounter for antineoplastic radiation therapy: Secondary | ICD-10-CM | POA: Diagnosis not present

## 2023-07-08 LAB — RAD ONC ARIA SESSION SUMMARY

## 2023-07-08 NOTE — Telephone Encounter (Signed)
 I called the patient and we reschedule to next week wed. At 2:30  and she saw it on my chart

## 2023-07-09 ENCOUNTER — Other Ambulatory Visit: Payer: Self-pay

## 2023-07-09 ENCOUNTER — Ambulatory Visit
Admission: RE | Admit: 2023-07-09 | Discharge: 2023-07-09 | Disposition: A | Discharge status: Home / Self Care | Source: Ambulatory Visit | Attending: Radiation Oncology | Admitting: Radiation Oncology

## 2023-07-09 DIAGNOSIS — Z510 Encounter for antineoplastic radiation therapy: Secondary | ICD-10-CM | POA: Diagnosis not present

## 2023-07-09 LAB — RAD ONC ARIA SESSION SUMMARY

## 2023-07-10 ENCOUNTER — Ambulatory Visit
Admission: RE | Admit: 2023-07-10 | Discharge: 2023-07-10 | Disposition: A | Discharge status: Home / Self Care | Source: Ambulatory Visit | Attending: Radiation Oncology | Admitting: Radiation Oncology

## 2023-07-10 ENCOUNTER — Other Ambulatory Visit: Payer: Self-pay

## 2023-07-10 DIAGNOSIS — Z510 Encounter for antineoplastic radiation therapy: Secondary | ICD-10-CM | POA: Diagnosis not present

## 2023-07-10 DIAGNOSIS — C50212 Malignant neoplasm of upper-inner quadrant of left female breast: Secondary | ICD-10-CM

## 2023-07-10 LAB — RAD ONC ARIA SESSION SUMMARY

## 2023-07-10 MED ORDER — RADIAPLEXRX EX GEL: Freq: Once | CUTANEOUS | Status: AC

## 2023-07-10 MED ORDER — ALRA NON-METALLIC DEODORANT (RAD-ONC)
1.0000 | Freq: Once | TOPICAL | Status: AC
  Administered 2023-07-10: 1 via TOPICAL

## 2023-07-13 ENCOUNTER — Ambulatory Visit
Admission: RE | Admit: 2023-07-13 | Discharge: 2023-07-13 | Disposition: A | Discharge status: Home / Self Care | Source: Ambulatory Visit | Attending: Radiation Oncology | Admitting: Radiation Oncology

## 2023-07-13 ENCOUNTER — Other Ambulatory Visit: Payer: Self-pay

## 2023-07-13 DIAGNOSIS — Z510 Encounter for antineoplastic radiation therapy: Secondary | ICD-10-CM | POA: Diagnosis not present

## 2023-07-13 LAB — RAD ONC ARIA SESSION SUMMARY

## 2023-07-14 ENCOUNTER — Ambulatory Visit
Admission: RE | Admit: 2023-07-14 | Discharge: 2023-07-14 | Disposition: A | Discharge status: Home / Self Care | Source: Ambulatory Visit | Attending: Radiation Oncology | Admitting: Radiation Oncology

## 2023-07-14 ENCOUNTER — Other Ambulatory Visit: Payer: Self-pay

## 2023-07-14 DIAGNOSIS — Z510 Encounter for antineoplastic radiation therapy: Secondary | ICD-10-CM | POA: Diagnosis not present

## 2023-07-14 LAB — RAD ONC ARIA SESSION SUMMARY

## 2023-07-15 ENCOUNTER — Ambulatory Visit
Admission: RE | Admit: 2023-07-15 | Discharge: 2023-07-15 | Disposition: A | Discharge status: Home / Self Care | Source: Ambulatory Visit | Attending: Radiation Oncology | Admitting: Radiation Oncology

## 2023-07-15 ENCOUNTER — Telehealth: Payer: Self-pay

## 2023-07-15 ENCOUNTER — Other Ambulatory Visit: Payer: Self-pay

## 2023-07-15 ENCOUNTER — Ambulatory Visit: Attending: Oncology | Admitting: Occupational Therapy

## 2023-07-15 DIAGNOSIS — L905 Scar conditions and fibrosis of skin: Secondary | ICD-10-CM | POA: Diagnosis present

## 2023-07-15 DIAGNOSIS — R293 Abnormal posture: Secondary | ICD-10-CM | POA: Diagnosis present

## 2023-07-15 DIAGNOSIS — Z510 Encounter for antineoplastic radiation therapy: Secondary | ICD-10-CM | POA: Diagnosis not present

## 2023-07-15 LAB — RAD ONC ARIA SESSION SUMMARY
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
## 2023-07-15 NOTE — Telephone Encounter (Signed)
 order has been faxed to [facility] medical supply, for:  compression sleeve and glove for LUE. Office visit notes and copy of ins card also faxed.

## 2023-07-16 ENCOUNTER — Ambulatory Visit
Admission: RE | Admit: 2023-07-16 | Discharge: 2023-07-16 | Disposition: A | Discharge status: Home / Self Care | Source: Ambulatory Visit | Attending: Radiation Oncology | Admitting: Radiation Oncology

## 2023-07-16 ENCOUNTER — Other Ambulatory Visit: Payer: Self-pay

## 2023-07-16 ENCOUNTER — Encounter: Payer: Self-pay | Admitting: *Deleted

## 2023-07-16 DIAGNOSIS — Z510 Encounter for antineoplastic radiation therapy: Secondary | ICD-10-CM | POA: Diagnosis present

## 2023-07-16 DIAGNOSIS — C50212 Malignant neoplasm of upper-inner quadrant of left female breast: Secondary | ICD-10-CM | POA: Diagnosis not present

## 2023-07-16 LAB — RAD ONC ARIA SESSION SUMMARY

## 2023-07-17 ENCOUNTER — Ambulatory Visit
Admission: RE | Admit: 2023-07-17 | Discharge: 2023-07-17 | Disposition: A | Discharge status: Home / Self Care | Source: Ambulatory Visit | Attending: Radiation Oncology | Admitting: Radiation Oncology

## 2023-07-17 ENCOUNTER — Other Ambulatory Visit: Payer: Self-pay

## 2023-07-17 DIAGNOSIS — Z510 Encounter for antineoplastic radiation therapy: Secondary | ICD-10-CM | POA: Diagnosis not present

## 2023-07-17 LAB — RAD ONC ARIA SESSION SUMMARY

## 2023-07-20 ENCOUNTER — Other Ambulatory Visit: Payer: Self-pay

## 2023-07-20 ENCOUNTER — Ambulatory Visit
Admission: RE | Admit: 2023-07-20 | Discharge: 2023-07-20 | Disposition: A | Discharge status: Home / Self Care | Source: Ambulatory Visit | Attending: Radiation Oncology | Admitting: Radiation Oncology

## 2023-07-20 DIAGNOSIS — Z510 Encounter for antineoplastic radiation therapy: Secondary | ICD-10-CM | POA: Diagnosis not present

## 2023-07-20 LAB — RAD ONC ARIA SESSION SUMMARY
Course Elapsed Days: 12
Plan Fractions Treated to Date: 9
Plan Fractions Treated to Date: 9
Plan Prescribed Dose Per Fraction: 1.8 Gy
Plan Prescribed Dose Per Fraction: 1.8 Gy
Plan Total Fractions Prescribed: 28
Plan Total Fractions Prescribed: 28
Plan Total Prescribed Dose: 50.4 Gy
Plan Total Prescribed Dose: 50.4 Gy
Reference Point Dosage Given to Date: 16.2 Gy
Reference Point Dosage Given to Date: 16.2 Gy
Reference Point Session Dosage Given: 1.8 Gy
Reference Point Session Dosage Given: 1.8 Gy
Session Number: 9

## 2023-07-21 ENCOUNTER — Other Ambulatory Visit: Payer: Self-pay

## 2023-07-21 ENCOUNTER — Ambulatory Visit
Admission: RE | Admit: 2023-07-21 | Discharge: 2023-07-21 | Disposition: A | Discharge status: Home / Self Care | Source: Ambulatory Visit | Attending: Radiation Oncology

## 2023-07-21 DIAGNOSIS — Z510 Encounter for antineoplastic radiation therapy: Secondary | ICD-10-CM | POA: Diagnosis not present

## 2023-07-21 LAB — RAD ONC ARIA SESSION SUMMARY

## 2023-07-22 ENCOUNTER — Ambulatory Visit
Admission: RE | Admit: 2023-07-22 | Discharge: 2023-07-22 | Disposition: A | Discharge status: Home / Self Care | Source: Ambulatory Visit | Attending: Radiation Oncology | Admitting: Radiation Oncology

## 2023-07-22 ENCOUNTER — Other Ambulatory Visit: Payer: Self-pay

## 2023-07-22 DIAGNOSIS — Z510 Encounter for antineoplastic radiation therapy: Secondary | ICD-10-CM | POA: Diagnosis not present

## 2023-07-22 LAB — RAD ONC ARIA SESSION SUMMARY

## 2023-07-23 ENCOUNTER — Ambulatory Visit
Admission: RE | Admit: 2023-07-23 | Discharge: 2023-07-23 | Disposition: A | Discharge status: Home / Self Care | Source: Ambulatory Visit | Attending: Radiation Oncology | Admitting: Radiation Oncology

## 2023-07-23 ENCOUNTER — Other Ambulatory Visit: Payer: Self-pay

## 2023-07-23 DIAGNOSIS — Z510 Encounter for antineoplastic radiation therapy: Secondary | ICD-10-CM | POA: Diagnosis not present

## 2023-07-23 LAB — RAD ONC ARIA SESSION SUMMARY
Course Elapsed Days: 15
Plan Fractions Treated to Date: 12
Plan Fractions Treated to Date: 12
Plan Prescribed Dose Per Fraction: 1.8 Gy
Plan Prescribed Dose Per Fraction: 1.8 Gy
Plan Total Fractions Prescribed: 28
Plan Total Fractions Prescribed: 28
Plan Total Prescribed Dose: 50.4 Gy
Plan Total Prescribed Dose: 50.4 Gy
Reference Point Dosage Given to Date: 21.6 Gy
Reference Point Dosage Given to Date: 21.6 Gy
Reference Point Session Dosage Given: 1.8 Gy
Reference Point Session Dosage Given: 1.8 Gy
Session Number: 12

## 2023-07-24 ENCOUNTER — Ambulatory Visit

## 2023-07-24 ENCOUNTER — Telehealth: Payer: Self-pay | Admitting: Radiation Oncology

## 2023-07-24 NOTE — Telephone Encounter (Signed)
 5/9 @ 8:07 am Received voicemail from patient due to still receiving mychart messages that she still have txt appts for 5/9 and 5/12, which she had cancel due to being out of town.  Called pt she is aware xrt appts for those days has been cancel as requested by [employer]. She was grateful for the call back.

## 2023-07-27 ENCOUNTER — Ambulatory Visit

## 2023-07-28 ENCOUNTER — Other Ambulatory Visit: Payer: Self-pay

## 2023-07-28 ENCOUNTER — Ambulatory Visit
Admission: RE | Admit: 2023-07-28 | Discharge: 2023-07-28 | Disposition: A | Discharge status: Home / Self Care | Source: Ambulatory Visit | Attending: Radiation Oncology | Admitting: Radiation Oncology

## 2023-07-28 DIAGNOSIS — Z510 Encounter for antineoplastic radiation therapy: Secondary | ICD-10-CM | POA: Diagnosis not present

## 2023-07-28 LAB — RAD ONC ARIA SESSION SUMMARY
Course Elapsed Days: 20
Plan Fractions Treated to Date: 13
Plan Fractions Treated to Date: 13
Plan Prescribed Dose Per Fraction: 1.8 Gy
Plan Prescribed Dose Per Fraction: 1.8 Gy
Plan Total Fractions Prescribed: 28
Plan Total Fractions Prescribed: 28
Plan Total Prescribed Dose: 50.4 Gy
Plan Total Prescribed Dose: 50.4 Gy
Reference Point Dosage Given to Date: 23.4 Gy
Reference Point Dosage Given to Date: 23.4 Gy
Reference Point Session Dosage Given: 1.8 Gy
Reference Point Session Dosage Given: 1.8 Gy
Session Number: 13

## 2023-07-29 ENCOUNTER — Other Ambulatory Visit: Payer: Self-pay

## 2023-07-29 ENCOUNTER — Ambulatory Visit
Admission: RE | Admit: 2023-07-29 | Discharge: 2023-07-29 | Disposition: A | Discharge status: Home / Self Care | Source: Ambulatory Visit | Attending: Radiation Oncology

## 2023-07-29 DIAGNOSIS — Z510 Encounter for antineoplastic radiation therapy: Secondary | ICD-10-CM | POA: Diagnosis not present

## 2023-07-29 LAB — RAD ONC ARIA SESSION SUMMARY
Course Elapsed Days: 21
Plan Fractions Treated to Date: 14
Plan Fractions Treated to Date: 14
Plan Prescribed Dose Per Fraction: 1.8 Gy
Plan Prescribed Dose Per Fraction: 1.8 Gy
Plan Total Fractions Prescribed: 28
Plan Total Fractions Prescribed: 28
Plan Total Prescribed Dose: 50.4 Gy
Plan Total Prescribed Dose: 50.4 Gy
Reference Point Dosage Given to Date: 25.2 Gy
Reference Point Dosage Given to Date: 25.2 Gy
Reference Point Session Dosage Given: 1.8 Gy
Reference Point Session Dosage Given: 1.8 Gy
Session Number: 14

## 2023-07-30 ENCOUNTER — Ambulatory Visit
Admission: RE | Admit: 2023-07-30 | Discharge: 2023-07-30 | Disposition: A | Discharge status: Home / Self Care | Source: Ambulatory Visit | Attending: Radiation Oncology | Admitting: Radiation Oncology

## 2023-07-30 ENCOUNTER — Other Ambulatory Visit: Payer: Self-pay

## 2023-07-30 DIAGNOSIS — Z510 Encounter for antineoplastic radiation therapy: Secondary | ICD-10-CM | POA: Diagnosis not present

## 2023-07-30 LAB — RAD ONC ARIA SESSION SUMMARY
Course Elapsed Days: 22
Plan Fractions Treated to Date: 15
Plan Fractions Treated to Date: 15
Plan Prescribed Dose Per Fraction: 1.8 Gy
Plan Prescribed Dose Per Fraction: 1.8 Gy
Plan Total Fractions Prescribed: 28
Plan Total Fractions Prescribed: 28
Plan Total Prescribed Dose: 50.4 Gy
Plan Total Prescribed Dose: 50.4 Gy
Reference Point Dosage Given to Date: 27 Gy
Reference Point Dosage Given to Date: 27 Gy
Reference Point Session Dosage Given: 1.8 Gy
Reference Point Session Dosage Given: 1.8 Gy
Session Number: 15

## 2023-07-31 ENCOUNTER — Other Ambulatory Visit: Payer: Self-pay

## 2023-07-31 ENCOUNTER — Ambulatory Visit
Admission: RE | Admit: 2023-07-31 | Discharge: 2023-07-31 | Disposition: A | Discharge status: Home / Self Care | Source: Ambulatory Visit | Attending: Radiation Oncology | Admitting: Radiation Oncology

## 2023-07-31 DIAGNOSIS — Z510 Encounter for antineoplastic radiation therapy: Secondary | ICD-10-CM | POA: Diagnosis not present

## 2023-07-31 LAB — RAD ONC ARIA SESSION SUMMARY
Course Elapsed Days: 23
Plan Fractions Treated to Date: 16
Plan Fractions Treated to Date: 16
Plan Prescribed Dose Per Fraction: 1.8 Gy
Plan Prescribed Dose Per Fraction: 1.8 Gy
Plan Total Fractions Prescribed: 28
Plan Total Fractions Prescribed: 28
Plan Total Prescribed Dose: 50.4 Gy
Plan Total Prescribed Dose: 50.4 Gy
Reference Point Dosage Given to Date: 28.8 Gy
Reference Point Dosage Given to Date: 28.8 Gy
Reference Point Session Dosage Given: 1.8 Gy
Reference Point Session Dosage Given: 1.8 Gy
Session Number: 16

## 2023-08-03 ENCOUNTER — Ambulatory Visit

## 2023-08-04 ENCOUNTER — Other Ambulatory Visit: Payer: Self-pay

## 2023-08-04 ENCOUNTER — Ambulatory Visit
Admission: RE | Admit: 2023-08-04 | Discharge: 2023-08-04 | Disposition: A | Discharge status: Home / Self Care | Source: Ambulatory Visit | Attending: Radiation Oncology | Admitting: Radiation Oncology

## 2023-08-04 DIAGNOSIS — Z510 Encounter for antineoplastic radiation therapy: Secondary | ICD-10-CM | POA: Diagnosis not present

## 2023-08-04 LAB — RAD ONC ARIA SESSION SUMMARY
Course Elapsed Days: 27
Plan Fractions Treated to Date: 17
Plan Fractions Treated to Date: 17
Plan Prescribed Dose Per Fraction: 1.8 Gy
Plan Prescribed Dose Per Fraction: 1.8 Gy
Plan Total Fractions Prescribed: 28
Plan Total Fractions Prescribed: 28
Plan Total Prescribed Dose: 50.4 Gy
Plan Total Prescribed Dose: 50.4 Gy
Reference Point Dosage Given to Date: 30.6 Gy
Reference Point Dosage Given to Date: 30.6 Gy
Reference Point Session Dosage Given: 1.8 Gy
Reference Point Session Dosage Given: 1.8 Gy
Session Number: 17

## 2023-08-05 ENCOUNTER — Ambulatory Visit
Admission: RE | Admit: 2023-08-05 | Discharge: 2023-08-05 | Disposition: A | Discharge status: Home / Self Care | Source: Ambulatory Visit | Attending: Radiation Oncology | Admitting: Radiation Oncology

## 2023-08-05 ENCOUNTER — Ambulatory Visit

## 2023-08-05 ENCOUNTER — Other Ambulatory Visit: Payer: Self-pay

## 2023-08-05 DIAGNOSIS — Z510 Encounter for antineoplastic radiation therapy: Secondary | ICD-10-CM | POA: Diagnosis not present

## 2023-08-05 LAB — RAD ONC ARIA SESSION SUMMARY
Course Elapsed Days: 28
Plan Fractions Treated to Date: 18
Plan Fractions Treated to Date: 18
Plan Prescribed Dose Per Fraction: 1.8 Gy
Plan Prescribed Dose Per Fraction: 1.8 Gy
Plan Total Fractions Prescribed: 28
Plan Total Fractions Prescribed: 28
Plan Total Prescribed Dose: 50.4 Gy
Plan Total Prescribed Dose: 50.4 Gy
Reference Point Dosage Given to Date: 32.4 Gy
Reference Point Dosage Given to Date: 32.4 Gy
Reference Point Session Dosage Given: 1.8 Gy
Reference Point Session Dosage Given: 1.8 Gy
Session Number: 18

## 2023-08-06 ENCOUNTER — Other Ambulatory Visit: Payer: Self-pay

## 2023-08-06 ENCOUNTER — Ambulatory Visit
Admission: RE | Admit: 2023-08-06 | Discharge: 2023-08-06 | Disposition: A | Discharge status: Home / Self Care | Source: Ambulatory Visit | Attending: Radiation Oncology | Admitting: Radiation Oncology

## 2023-08-06 DIAGNOSIS — Z510 Encounter for antineoplastic radiation therapy: Secondary | ICD-10-CM | POA: Diagnosis not present

## 2023-08-06 LAB — RAD ONC ARIA SESSION SUMMARY
Course Elapsed Days: 29
Plan Fractions Treated to Date: 19
Plan Fractions Treated to Date: 19
Plan Prescribed Dose Per Fraction: 1.8 Gy
Plan Prescribed Dose Per Fraction: 1.8 Gy
Plan Total Fractions Prescribed: 28
Plan Total Fractions Prescribed: 28
Plan Total Prescribed Dose: 50.4 Gy
Plan Total Prescribed Dose: 50.4 Gy
Reference Point Dosage Given to Date: 34.2 Gy
Reference Point Dosage Given to Date: 34.2 Gy
Reference Point Session Dosage Given: 1.8 Gy
Reference Point Session Dosage Given: 1.8 Gy
Session Number: 19

## 2023-08-07 ENCOUNTER — Ambulatory Visit
Admission: RE | Admit: 2023-08-07 | Discharge: 2023-08-07 | Disposition: A | Discharge status: Home / Self Care | Source: Ambulatory Visit | Attending: Radiation Oncology | Admitting: Radiation Oncology

## 2023-08-07 ENCOUNTER — Other Ambulatory Visit: Payer: Self-pay

## 2023-08-07 DIAGNOSIS — Z510 Encounter for antineoplastic radiation therapy: Secondary | ICD-10-CM | POA: Diagnosis not present

## 2023-08-07 LAB — RAD ONC ARIA SESSION SUMMARY
Course Elapsed Days: 30
Plan Fractions Treated to Date: 20
Plan Fractions Treated to Date: 20
Plan Prescribed Dose Per Fraction: 1.8 Gy
Plan Prescribed Dose Per Fraction: 1.8 Gy
Plan Total Fractions Prescribed: 28
Plan Total Fractions Prescribed: 28
Plan Total Prescribed Dose: 50.4 Gy
Plan Total Prescribed Dose: 50.4 Gy
Reference Point Dosage Given to Date: 36 Gy
Reference Point Dosage Given to Date: 36 Gy
Reference Point Session Dosage Given: 1.8 Gy
Reference Point Session Dosage Given: 1.8 Gy
Session Number: 20

## 2023-08-11 ENCOUNTER — Ambulatory Visit

## 2023-08-11 ENCOUNTER — Ambulatory Visit
Admission: RE | Admit: 2023-08-11 | Discharge: 2023-08-11 | Disposition: A | Discharge status: Home / Self Care | Source: Ambulatory Visit | Attending: Radiation Oncology | Admitting: Radiation Oncology

## 2023-08-11 ENCOUNTER — Other Ambulatory Visit: Payer: Self-pay

## 2023-08-11 DIAGNOSIS — Z510 Encounter for antineoplastic radiation therapy: Secondary | ICD-10-CM | POA: Diagnosis not present

## 2023-08-11 LAB — RAD ONC ARIA SESSION SUMMARY
Course Elapsed Days: 34
Plan Fractions Treated to Date: 21
Plan Fractions Treated to Date: 21
Plan Prescribed Dose Per Fraction: 1.8 Gy
Plan Prescribed Dose Per Fraction: 1.8 Gy
Plan Total Fractions Prescribed: 28
Plan Total Fractions Prescribed: 28
Plan Total Prescribed Dose: 50.4 Gy
Plan Total Prescribed Dose: 50.4 Gy
Reference Point Dosage Given to Date: 37.8 Gy
Reference Point Dosage Given to Date: 37.8 Gy
Reference Point Session Dosage Given: 1.8 Gy
Reference Point Session Dosage Given: 1.8 Gy
Session Number: 21

## 2023-08-12 ENCOUNTER — Ambulatory Visit
Admission: RE | Admit: 2023-08-12 | Discharge: 2023-08-12 | Disposition: A | Discharge status: Home / Self Care | Source: Ambulatory Visit | Attending: Radiation Oncology

## 2023-08-12 ENCOUNTER — Other Ambulatory Visit: Payer: Self-pay

## 2023-08-12 DIAGNOSIS — Z510 Encounter for antineoplastic radiation therapy: Secondary | ICD-10-CM | POA: Diagnosis not present

## 2023-08-12 LAB — RAD ONC ARIA SESSION SUMMARY
Course Elapsed Days: 35
Plan Fractions Treated to Date: 22
Plan Fractions Treated to Date: 22
Plan Prescribed Dose Per Fraction: 1.8 Gy
Plan Prescribed Dose Per Fraction: 1.8 Gy
Plan Total Fractions Prescribed: 28
Plan Total Fractions Prescribed: 28
Plan Total Prescribed Dose: 50.4 Gy
Plan Total Prescribed Dose: 50.4 Gy
Reference Point Dosage Given to Date: 39.6 Gy
Reference Point Dosage Given to Date: 39.6 Gy
Reference Point Session Dosage Given: 1.8 Gy
Reference Point Session Dosage Given: 1.8 Gy
Session Number: 22

## 2023-08-13 ENCOUNTER — Ambulatory Visit
Admission: RE | Admit: 2023-08-13 | Discharge: 2023-08-13 | Discharge status: Home / Self Care | Source: Ambulatory Visit | Attending: Radiation Oncology

## 2023-08-13 ENCOUNTER — Other Ambulatory Visit: Payer: Self-pay

## 2023-08-13 DIAGNOSIS — Z510 Encounter for antineoplastic radiation therapy: Secondary | ICD-10-CM | POA: Diagnosis not present

## 2023-08-13 LAB — RAD ONC ARIA SESSION SUMMARY
Course Elapsed Days: 36
Plan Fractions Treated to Date: 23
Plan Fractions Treated to Date: 23
Plan Prescribed Dose Per Fraction: 1.8 Gy
Plan Prescribed Dose Per Fraction: 1.8 Gy
Plan Total Fractions Prescribed: 28
Plan Total Fractions Prescribed: 28
Plan Total Prescribed Dose: 50.4 Gy
Plan Total Prescribed Dose: 50.4 Gy
Reference Point Dosage Given to Date: 41.4 Gy
Reference Point Dosage Given to Date: 41.4 Gy
Reference Point Session Dosage Given: 1.8 Gy
Reference Point Session Dosage Given: 1.8 Gy
Session Number: 23

## 2023-08-14 ENCOUNTER — Other Ambulatory Visit: Payer: Self-pay

## 2023-08-14 ENCOUNTER — Ambulatory Visit: Admitting: Radiation Oncology

## 2023-08-14 ENCOUNTER — Ambulatory Visit
Admission: RE | Admit: 2023-08-14 | Discharge: 2023-08-14 | Disposition: A | Discharge status: Home / Self Care | Source: Ambulatory Visit | Attending: Radiation Oncology | Admitting: Radiation Oncology

## 2023-08-14 DIAGNOSIS — Z510 Encounter for antineoplastic radiation therapy: Secondary | ICD-10-CM | POA: Diagnosis not present

## 2023-08-14 LAB — RAD ONC ARIA SESSION SUMMARY
Course Elapsed Days: 37
Plan Fractions Treated to Date: 24
Plan Fractions Treated to Date: 24
Plan Prescribed Dose Per Fraction: 1.8 Gy
Plan Prescribed Dose Per Fraction: 1.8 Gy
Plan Total Fractions Prescribed: 28
Plan Total Fractions Prescribed: 28
Plan Total Prescribed Dose: 50.4 Gy
Plan Total Prescribed Dose: 50.4 Gy
Reference Point Dosage Given to Date: 43.2 Gy
Reference Point Dosage Given to Date: 43.2 Gy
Reference Point Session Dosage Given: 1.8 Gy
Reference Point Session Dosage Given: 1.8 Gy
Session Number: 24

## 2023-08-17 ENCOUNTER — Ambulatory Visit

## 2023-08-18 ENCOUNTER — Ambulatory Visit

## 2023-08-18 ENCOUNTER — Other Ambulatory Visit: Payer: Self-pay

## 2023-08-18 ENCOUNTER — Ambulatory Visit
Admission: RE | Admit: 2023-08-18 | Discharge: 2023-08-18 | Disposition: A | Discharge status: Home / Self Care | Source: Ambulatory Visit | Attending: Radiation Oncology | Admitting: Radiation Oncology

## 2023-08-18 DIAGNOSIS — C50212 Malignant neoplasm of upper-inner quadrant of left female breast: Secondary | ICD-10-CM | POA: Diagnosis not present

## 2023-08-18 DIAGNOSIS — Z170 Estrogen receptor positive status [ER+]: Secondary | ICD-10-CM | POA: Insufficient documentation

## 2023-08-18 DIAGNOSIS — Z510 Encounter for antineoplastic radiation therapy: Secondary | ICD-10-CM | POA: Diagnosis present

## 2023-08-18 LAB — RAD ONC ARIA SESSION SUMMARY
Course Elapsed Days: 41
Plan Fractions Treated to Date: 25
Plan Fractions Treated to Date: 25
Plan Prescribed Dose Per Fraction: 1.8 Gy
Plan Prescribed Dose Per Fraction: 1.8 Gy
Plan Total Fractions Prescribed: 28
Plan Total Fractions Prescribed: 28
Plan Total Prescribed Dose: 50.4 Gy
Plan Total Prescribed Dose: 50.4 Gy
Reference Point Dosage Given to Date: 45 Gy
Reference Point Dosage Given to Date: 45 Gy
Reference Point Session Dosage Given: 1.8 Gy
Reference Point Session Dosage Given: 1.8 Gy
Session Number: 25

## 2023-08-19 ENCOUNTER — Other Ambulatory Visit: Payer: Self-pay

## 2023-08-19 ENCOUNTER — Ambulatory Visit
Admission: RE | Admit: 2023-08-19 | Discharge: 2023-08-19 | Discharge status: Home / Self Care | Source: Ambulatory Visit | Attending: Radiation Oncology

## 2023-08-19 DIAGNOSIS — Z510 Encounter for antineoplastic radiation therapy: Secondary | ICD-10-CM | POA: Diagnosis not present

## 2023-08-19 LAB — RAD ONC ARIA SESSION SUMMARY
Course Elapsed Days: 42
Plan Fractions Treated to Date: 26
Plan Fractions Treated to Date: 26
Plan Prescribed Dose Per Fraction: 1.8 Gy
Plan Prescribed Dose Per Fraction: 1.8 Gy
Plan Total Fractions Prescribed: 28
Plan Total Fractions Prescribed: 28
Plan Total Prescribed Dose: 50.4 Gy
Plan Total Prescribed Dose: 50.4 Gy
Reference Point Dosage Given to Date: 46.8 Gy
Reference Point Dosage Given to Date: 46.8 Gy
Reference Point Session Dosage Given: 1.8 Gy
Reference Point Session Dosage Given: 1.8 Gy
Session Number: 26

## 2023-08-20 ENCOUNTER — Ambulatory Visit

## 2023-08-20 ENCOUNTER — Ambulatory Visit
Admission: RE | Admit: 2023-08-20 | Discharge: 2023-08-20 | Discharge status: Home / Self Care | Source: Ambulatory Visit | Attending: Radiation Oncology

## 2023-08-20 ENCOUNTER — Other Ambulatory Visit: Payer: Self-pay

## 2023-08-20 DIAGNOSIS — Z510 Encounter for antineoplastic radiation therapy: Secondary | ICD-10-CM | POA: Diagnosis not present

## 2023-08-20 LAB — RAD ONC ARIA SESSION SUMMARY
Course Elapsed Days: 43
Plan Fractions Treated to Date: 27
Plan Fractions Treated to Date: 27
Plan Prescribed Dose Per Fraction: 1.8 Gy
Plan Prescribed Dose Per Fraction: 1.8 Gy
Plan Total Fractions Prescribed: 28
Plan Total Fractions Prescribed: 28
Plan Total Prescribed Dose: 50.4 Gy
Plan Total Prescribed Dose: 50.4 Gy
Reference Point Dosage Given to Date: 48.6 Gy
Reference Point Dosage Given to Date: 48.6 Gy
Reference Point Session Dosage Given: 1.8 Gy
Reference Point Session Dosage Given: 1.8 Gy
Session Number: 27

## 2023-08-21 ENCOUNTER — Other Ambulatory Visit: Payer: Self-pay

## 2023-08-21 ENCOUNTER — Ambulatory Visit
Admission: RE | Admit: 2023-08-21 | Discharge: 2023-08-21 | Disposition: A | Discharge status: Home / Self Care | Source: Ambulatory Visit | Attending: Radiation Oncology | Admitting: Radiation Oncology

## 2023-08-21 DIAGNOSIS — Z510 Encounter for antineoplastic radiation therapy: Secondary | ICD-10-CM | POA: Diagnosis not present

## 2023-08-21 LAB — RAD ONC ARIA SESSION SUMMARY
Course Elapsed Days: 44
Plan Fractions Treated to Date: 28
Plan Fractions Treated to Date: 28
Plan Prescribed Dose Per Fraction: 1.8 Gy
Plan Prescribed Dose Per Fraction: 1.8 Gy
Plan Total Fractions Prescribed: 28
Plan Total Fractions Prescribed: 28
Plan Total Prescribed Dose: 50.4 Gy
Plan Total Prescribed Dose: 50.4 Gy
Reference Point Dosage Given to Date: 50.4 Gy
Reference Point Dosage Given to Date: 50.4 Gy
Reference Point Session Dosage Given: 1.8 Gy
Reference Point Session Dosage Given: 1.8 Gy
Session Number: 28

## 2023-08-24 ENCOUNTER — Ambulatory Visit

## 2023-08-24 ENCOUNTER — Other Ambulatory Visit: Payer: Self-pay

## 2023-08-24 ENCOUNTER — Ambulatory Visit
Admission: RE | Admit: 2023-08-24 | Discharge: 2023-08-24 | Disposition: A | Discharge status: Home / Self Care | Source: Ambulatory Visit | Attending: Radiation Oncology | Admitting: Radiation Oncology

## 2023-08-24 DIAGNOSIS — Z510 Encounter for antineoplastic radiation therapy: Secondary | ICD-10-CM | POA: Diagnosis not present

## 2023-08-24 LAB — RAD ONC ARIA SESSION SUMMARY
Course Elapsed Days: 47
Plan Fractions Treated to Date: 1
Plan Prescribed Dose Per Fraction: 2 Gy
Plan Total Fractions Prescribed: 5
Plan Total Prescribed Dose: 10 Gy
Reference Point Dosage Given to Date: 2 Gy
Reference Point Session Dosage Given: 2 Gy
Session Number: 29

## 2023-08-25 ENCOUNTER — Ambulatory Visit

## 2023-08-25 ENCOUNTER — Other Ambulatory Visit: Payer: Self-pay

## 2023-08-25 ENCOUNTER — Ambulatory Visit
Admission: RE | Admit: 2023-08-25 | Discharge: 2023-08-25 | Disposition: A | Discharge status: Home / Self Care | Source: Ambulatory Visit | Attending: Radiation Oncology

## 2023-08-25 DIAGNOSIS — Z510 Encounter for antineoplastic radiation therapy: Secondary | ICD-10-CM | POA: Diagnosis not present

## 2023-08-25 LAB — RAD ONC ARIA SESSION SUMMARY
Course Elapsed Days: 48
Plan Fractions Treated to Date: 2
Plan Prescribed Dose Per Fraction: 2 Gy
Plan Total Fractions Prescribed: 5
Plan Total Prescribed Dose: 10 Gy
Reference Point Dosage Given to Date: 4 Gy
Reference Point Session Dosage Given: 2 Gy
Session Number: 30

## 2023-08-26 ENCOUNTER — Other Ambulatory Visit: Payer: Self-pay

## 2023-08-26 ENCOUNTER — Ambulatory Visit

## 2023-08-26 ENCOUNTER — Ambulatory Visit
Admission: RE | Admit: 2023-08-26 | Discharge: 2023-08-26 | Discharge status: Home / Self Care | Source: Ambulatory Visit | Attending: Radiation Oncology

## 2023-08-26 DIAGNOSIS — Z510 Encounter for antineoplastic radiation therapy: Secondary | ICD-10-CM | POA: Diagnosis not present

## 2023-08-26 LAB — RAD ONC ARIA SESSION SUMMARY
Course Elapsed Days: 49
Plan Fractions Treated to Date: 3
Plan Prescribed Dose Per Fraction: 2 Gy
Plan Total Fractions Prescribed: 5
Plan Total Prescribed Dose: 10 Gy
Reference Point Dosage Given to Date: 6 Gy
Reference Point Session Dosage Given: 2 Gy
Session Number: 31

## 2023-08-27 ENCOUNTER — Ambulatory Visit
Admission: RE | Admit: 2023-08-27 | Discharge: 2023-08-27 | Disposition: A | Discharge status: Home / Self Care | Source: Ambulatory Visit | Attending: Radiation Oncology | Admitting: Radiation Oncology

## 2023-08-27 ENCOUNTER — Ambulatory Visit

## 2023-08-27 ENCOUNTER — Other Ambulatory Visit: Payer: Self-pay

## 2023-08-27 DIAGNOSIS — Z510 Encounter for antineoplastic radiation therapy: Secondary | ICD-10-CM | POA: Diagnosis not present

## 2023-08-27 LAB — RAD ONC ARIA SESSION SUMMARY
Course Elapsed Days: 50
Plan Fractions Treated to Date: 4
Plan Prescribed Dose Per Fraction: 2 Gy
Plan Total Fractions Prescribed: 5
Plan Total Prescribed Dose: 10 Gy
Reference Point Dosage Given to Date: 8 Gy
Reference Point Session Dosage Given: 2 Gy
Session Number: 32

## 2023-08-28 ENCOUNTER — Other Ambulatory Visit: Payer: Self-pay

## 2023-08-28 ENCOUNTER — Ambulatory Visit
Admission: RE | Admit: 2023-08-28 | Discharge: 2023-08-28 | Disposition: A | Discharge status: Home / Self Care | Source: Ambulatory Visit | Attending: Radiation Oncology | Admitting: Radiation Oncology

## 2023-08-28 ENCOUNTER — Encounter: Payer: Self-pay | Admitting: *Deleted

## 2023-08-28 DIAGNOSIS — Z510 Encounter for antineoplastic radiation therapy: Secondary | ICD-10-CM | POA: Diagnosis not present

## 2023-08-28 LAB — RAD ONC ARIA SESSION SUMMARY
Course Elapsed Days: 51
Plan Fractions Treated to Date: 5
Plan Prescribed Dose Per Fraction: 2 Gy
Plan Total Fractions Prescribed: 5
Plan Total Prescribed Dose: 10 Gy
Reference Point Dosage Given to Date: 10 Gy
Reference Point Session Dosage Given: 2 Gy
Session Number: 33

## 2023-08-31 NOTE — Radiation Completion Notes (Signed)
  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   Intent: Curative     ==========DELIVERED PLANS==========  First Treatment Date: 2023-07-08 Last Treatment Date: 2023-08-28   Plan Name: Breast_L_BH Site: Breast, Left Technique: 3D Mode: Photon Dose Per Fraction: 1.8 Gy Prescribed Dose (Delivered / Prescribed): 50.4 Gy / 50.4 Gy Prescribed Fxs (Delivered / Prescribed): 28 / 28   Plan Name: SCV_L_PAB_BH Site: Sclav-LT Technique: 3D Mode: Photon Dose Per Fraction: 1.8 Gy Prescribed Dose (Delivered / Prescribed): 50.4 Gy / 50.4 Gy Prescribed Fxs (Delivered / Prescribed): 28 / 28   Plan Name: Brst_L_Bst_BH Site: Breast, Left Technique: 3D Mode: Photon Dose Per Fraction: 2 Gy Prescribed Dose (Delivered / Prescribed): 10 Gy / 10 Gy Prescribed Fxs (Delivered / Prescribed): 5 / 5     ==========ON TREATMENT VISIT DATES========== 2023-07-10, 2023-07-17, 2023-07-23, 2023-07-31, 2023-08-07, 2023-08-14, 2023-08-21, 2023-08-28    See weekly On Treatment Notes in Epic for details in the Media tab (listed as Progress notes on the On Treatment Visit Dates listed above). The patient tolerated radiation. She developed fatigue and anticipated skin changes in the treatment field.   The patient will receive a call in about one month from the radiation oncology department. She will continue follow up with Dr. Wilhelmenia Harada as well.      Shelvia Dick, PAC

## 2023-09-10 ENCOUNTER — Encounter: Payer: Self-pay | Admitting: Oncology

## 2023-09-10 ENCOUNTER — Inpatient Hospital Stay (HOSPITAL_BASED_OUTPATIENT_CLINIC_OR_DEPARTMENT_OTHER): Admitting: Oncology

## 2023-09-10 ENCOUNTER — Inpatient Hospital Stay: Attending: Oncology

## 2023-09-10 ENCOUNTER — Inpatient Hospital Stay: Admitting: *Deleted

## 2023-09-10 VITALS — BP 126/59 | HR 65 | Temp 97.8°F | Resp 20 | Wt 253.2 lb

## 2023-09-10 DIAGNOSIS — Z1732 Human epidermal growth factor receptor 2 negative status: Secondary | ICD-10-CM | POA: Insufficient documentation

## 2023-09-10 DIAGNOSIS — Z86711 Personal history of pulmonary embolism: Secondary | ICD-10-CM | POA: Insufficient documentation

## 2023-09-10 DIAGNOSIS — C50212 Malignant neoplasm of upper-inner quadrant of left female breast: Secondary | ICD-10-CM

## 2023-09-10 DIAGNOSIS — Z170 Estrogen receptor positive status [ER+]: Secondary | ICD-10-CM | POA: Insufficient documentation

## 2023-09-10 DIAGNOSIS — Z79899 Other long term (current) drug therapy: Secondary | ICD-10-CM | POA: Diagnosis not present

## 2023-09-10 DIAGNOSIS — Z79811 Long term (current) use of aromatase inhibitors: Secondary | ICD-10-CM | POA: Diagnosis not present

## 2023-09-10 DIAGNOSIS — Z9189 Other specified personal risk factors, not elsewhere classified: Secondary | ICD-10-CM

## 2023-09-10 DIAGNOSIS — D509 Iron deficiency anemia, unspecified: Secondary | ICD-10-CM | POA: Insufficient documentation

## 2023-09-10 DIAGNOSIS — Z809 Family history of malignant neoplasm, unspecified: Secondary | ICD-10-CM | POA: Diagnosis not present

## 2023-09-10 DIAGNOSIS — Z1721 Progesterone receptor positive status: Secondary | ICD-10-CM | POA: Diagnosis not present

## 2023-09-10 DIAGNOSIS — D500 Iron deficiency anemia secondary to blood loss (chronic): Secondary | ICD-10-CM

## 2023-09-10 DIAGNOSIS — Z7982 Long term (current) use of aspirin: Secondary | ICD-10-CM | POA: Diagnosis not present

## 2023-09-10 DIAGNOSIS — Z87891 Personal history of nicotine dependence: Secondary | ICD-10-CM | POA: Insufficient documentation

## 2023-09-10 DIAGNOSIS — C50919 Malignant neoplasm of unspecified site of unspecified female breast: Secondary | ICD-10-CM

## 2023-09-10 DIAGNOSIS — M8580 Other specified disorders of bone density and structure, unspecified site: Secondary | ICD-10-CM

## 2023-09-10 DIAGNOSIS — E876 Hypokalemia: Secondary | ICD-10-CM

## 2023-09-10 LAB — CMP (CANCER CENTER ONLY)
ALT: 13 U/L (ref 0–44)
AST: 19 U/L (ref 15–41)
Albumin: 3.6 g/dL (ref 3.5–5.0)
Alkaline Phosphatase: 59 U/L (ref 38–126)
Anion gap: 8 (ref 5–15)
BUN: 13 mg/dL (ref 8–23)
CO2: 28 mmol/L (ref 22–32)
Calcium: 9.8 mg/dL (ref 8.9–10.3)
Chloride: 102 mmol/L (ref 98–111)
Creatinine: 0.83 mg/dL (ref 0.44–1.00)
GFR, Estimated: >60 mL/min (ref >60)
Glucose, Bld: 107 mg/dL — ABNORMAL HIGH (ref 70–99)
Potassium: 3.3 mmol/L — ABNORMAL LOW (ref 3.5–5.1)
Sodium: 138 mmol/L (ref 135–145)
Total Bilirubin: 0.6 mg/dL (ref 0.0–1.2)
Total Protein: 7.2 g/dL (ref 6.5–8.1)

## 2023-09-10 LAB — CBC WITH DIFFERENTIAL (CANCER CENTER ONLY)
Abs Immature Granulocytes: 0.01 10*3/uL (ref 0.00–0.07)
Basophils Absolute: 0 10*3/uL (ref 0.0–0.1)
Basophils Relative: 1 %
Eosinophils Absolute: 0.3 10*3/uL (ref 0.0–0.5)
Eosinophils Relative: 6 %
HCT: 36.2 % (ref 36.0–46.0)
Hemoglobin: 11.8 g/dL — ABNORMAL LOW (ref 12.0–15.0)
Immature Granulocytes: 0 %
Lymphocytes Relative: 18 %
Lymphs Abs: 0.8 10*3/uL (ref 0.7–4.0)
MCH: 27.3 pg (ref 26.0–34.0)
MCHC: 32.6 g/dL (ref 30.0–36.0)
MCV: 83.8 fL (ref 80.0–100.0)
Monocytes Absolute: 0.6 10*3/uL (ref 0.1–1.0)
Monocytes Relative: 15 %
Neutro Abs: 2.5 10*3/uL (ref 1.7–7.7)
Neutrophils Relative %: 60 %
Platelet Count: 222 10*3/uL (ref 150–400)
RBC: 4.32 MIL/uL (ref 3.87–5.11)
RDW: 14.4 % (ref 11.5–15.5)
WBC Count: 4.1 10*3/uL (ref 4.0–10.5)
nRBC: 0 % (ref 0.0–0.2)

## 2023-09-10 MED ORDER — ANASTROZOLE 1 MG PO TABS: 1.0000 mg | ORAL_TABLET | Freq: Every day | ORAL | 3 refills | Status: DC

## 2023-09-10 NOTE — Progress Notes (Signed)
 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 Status post left breast lumpectomy and bilateral breast reduction. Oncotype DX sent on largest breast mass came back at 9. no benefit for adjuvant chemotherapy. S/p adjuvant radiation followed  recommend adjuvant endocrine therapy with aromatase inhibitor. Rationale of using aromatase inhibitor -Arimidex  discussed with patient.  Side effects of Arimidex including but not limited to hot flash, muscle and joint pain, fatigue, mood swing, vaginal dryness/inching, loss of bone mineral density,hair thinning, heart problem, etc were discussed with patient.  Patient voices understanding and willing to proceed.       Family history of cancer Patient is interested in obtaining genetic testing.  Refer to Dentist.  History of pulmonary embolism Provoked by OCP use.  Currently not on anticoagulation.  Iron  deficiency anemia Lab Results  Component Value Date   HGB 11.8 (L) 09/10/2023   TIBC 482 (H) 03/26/2023   IRONPCTSAT 5 (L) 03/26/2023   FERRITIN 14 03/26/2023    Recommend patient to take Vitron C 1 tab daily.   Osteopenia Recommend calcium  and vitamin D   supplementation.  Hypokalemia Intermittent hypokalemia, this is a chronic issue for her.  She is on calcium  supplementation.  Managed by primary care provider    Orders Placed This Encounter  Procedures   CBC with Differential (Cancer Center Only)    Standing Status:   Future    Expected Date:   12/11/2023    Expiration Date:   03/10/2024   CMP (Cancer Center only)    Standing Status:   Future    Expected Date:   12/11/2023    Expiration Date:   03/10/2024   Iron  and TIBC    Standing Status:   Future    Expected Date:   12/11/2023    Expiration Date:   03/10/2024   Ferritin    Standing Status:   Future    Expected Date:   12/11/2023    Expiration Date:   03/10/2024   Retic Panel    Standing Status:   Future    Expected Date:   12/11/2023    Expiration Date:   03/10/2024    Follow-up in 3 months All questions were answered. The patient knows to call the clinic with any problems, questions or concerns.  Call Babara, MD, PhD [facility] Hematology Oncology 09/10/2023    HISTORY OF PRESENTING ILLNESS:  Paige Hester 68 y.o. female presents to establish care for left breast cancer I have reviewed her chart and materials related to her cancer extensively and collaborated history with the patient. Summary of oncologic history is as follows: Oncology History  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)  02/26/2023 Mammogram   Bilateral screening  mammogram  In the left breast, possible distortion warrants further evaluation. In the right breast, no findings suspicious for malignancy.     03/04/2023 Mammogram   Bilateral diagnostic mammogram showed  1. Left breast 7 mm irregular hypoechoic mass in the 12 o'clock position is suspicious for malignancy. Recommend further assessment with ultrasound-guided biopsy. 2. Additional focal asymmetry with subtle distortion in the upper inner left breast posterior depth, located approximately 5.0  cm posterior to the mass, is also suspicious for malignancy. Recommend further assessment with stereotactic guided biopsy. 3. Incidentally visualized benign cyst in the left breast 11 o'clock position.   RECOMMENDATION: 1. Left breast ultrasound-guided biopsy (1 site). 2. Left breast stereotactic guided biopsy (1 site).   03/17/2023 Initial Diagnosis   Invasive carcinoma of breast [facility])  Patient underwent left breast biopsy 1. Breast, left, needle core biopsy, 12 o'clock, 7cmfn (ribbon clip) :      - INVASIVE DUCTAL CARCINOMA, SEE NOTE      - TUBULE FORMATION: SCORE 2      - NUCLEAR PLEOMORPHISM: SCORE 2      - MITOTIC COUNT: SCORE 1      - TOTAL SCORE: 5      - OVERALL GRADE: 1      - LYMPHOVASCULAR INVASION: NOT IDENTIFIED      - CANCER LENGTH: 5.5 MM      - CALCIFICATIONS: PRESENT       2. Breast, left, needle core biopsy, 11 o'clock, 15cmfn (coil clip) :      - INVASIVE DUCTAL CARCINOMA, SEE NOTE      - TUBULE FORMATION: SCORE 2      - NUCLEAR PLEOMORPHISM: SCORE 2      - MITOTIC COUNT: SCORE 1      - TOTAL SCORE: 5      - OVERALL GRADE: 1      - LYMPHOVASCULAR INVASION: NOT IDENTIFIED      - CANCER LENGTH: 4 MM      - CALCIFICATIONS: NOT IDENTIFIED       Diagnosis Note : - 2.Dr.Patrick reviewed the case and concurs with the      interpretations.Immunohistochemical staining for myoepithelial markers      (calponin, smooth muscle myosin and p63) is performed on blocks 1 and 2.The      myoepithelial markers are focally lost which is consistent with the above      diagnoses.       Block 1 ER 99% positive, PR 40% positive, HER2 IHC 2+, FISH negative.        Block 2 ER 90% positive, PR 70% positive, HER2 IHC 2+, FISH negative [result in image view]     03/26/2023 Cancer Staging   Staging form: Breast, AJCC 8th Edition - Clinical stage from 03/26/2023: Stage IA (cT1b, cN0, cM0, G1, ER+, PR+, HER2-) - Signed by Babara Call, MD on 03/26/2023 Stage prefix: Initial  diagnosis Histologic grading system: 3 grade system   03/30/2023 Procedure   MRI guided core needle biopsy of the left breast showed1. Breast, left, needle core biopsy, superior central (barbell clip) :      INVASIVE MAMMARY CARCINOMA, SEE NOTE      TUBULE FORMATION: SCORE 3      NUCLEAR PLEOMORPHISM: SCORE 2      MITOTIC COUNT: SCORE 1      TOTAL SCORE: 6      OVERALL GRADE: 2      LYMPHOVASCULAR INVASION: NOT IDENTIFIED      CANCER LENGTH: 0.6  CM      CALCIFICATIONS: NOT IDENTIFIED      OTHER FINDINGS: NONE      ER 100%, PR 2% HER2 1+ negative.        2. Breast, left, needle core biopsy, superior anterior (cylinder clip) :      INVASIVE MAMMARY CARCINOMA, SEE NOTE      TUBULE FORMATION: SCORE 3      NUCLEAR PLEOMORPHISM: SCORE 2      MITOTIC COUNT: SCORE 1      TOTAL SCORE: 6      OVERALL GRADE: 2      LYMPHOVASCULAR INVASION: NOT IDENTIFIED      CANCER LENGTH: 0.4      CALCIFICATIONS: NOT IDENTIFIED      OTHER FINDINGS: NONE       ER 95%, PR 30% HER2 1+ negative.     04/03/2023 Genetic Testing   Negative genetic testing on the BRCA STAT Panel through [facility]. The Common Hereditary Cancer + RNA panel was also negative. The report dates are 04/03/2023 and April 08, 2023  The STAT Breast cancer panel offered by [facility] includes sequencing and rearrangement analysis for the following 9 genes:  ATM, BRCA1, BRCA2, CDH1, CHEK2, PALB2, PTEN, STK11 and TP53.   The Common Hereditary Gene Panel offered by [facility] includes sequencing and/or deletion duplication testing of the following 48 genes: APC, ATM, AXIN2, BAP1, BARD1, BMPR1A, BRCA1, BRCA2, BRIP1, CDH1, CDK4, CDKN2A (p14ARF), CDKN2A (p16INK4a), CHEK2, CTNNA1, DICER1, EPCAM (Deletion/duplication testing only), GREM1 (promoter region deletion/duplication testing only), KIT, MEN1, MLH1, MSH2, MSH3, MSH6, MUTYH, NBN, NF1, NHTL1, PALB2, PDGFRA, PMS2, POLD1, POLE, PTEN, RAD50, RAD51C, RAD51D, SDHB, SDHC, SDHD, SMAD4, SMARCA4. STK11, TP53,  TSC1, TSC2, and VHL.  The following genes were evaluated for sequence changes only: SDHA and HOXB13 c.251G>A variant only.     04/09/2023 Imaging   MRI breast with and without contrast showed 1. Two sites of biopsy proven malignancy in the left breast, with the mass at the 12 o'clock position measuring 1.2 x 0.9 x 0.7 cm and the mass/enhancement at the 11 o'clock position posterior depth measuring up to 1.5 cm.   2. Additional 1.4 cm suspicious mass in the upper central left breast (image 79) located inferior to the biopsy proven malignancy at the 12 o'clock position.   3. Two 0.4 cm indeterminate masses in the upper central left breast anterior to mid depth (image 84).   05/21/2023 Surgery   Patient underwent left lumpectomy and sentinel lymph node biopsy.   05/21/2023 Surgery   Patient went to [facility] for [facility] opinion for breast surgery and the preferred breast preserving surgery.  Patient underwent left lumpectomy and sentinel lymph node biopsy on 05/21/2023. Tumor 1: 6 mm Grade 1 ER+ (>75%, strong), PR+ (>75%, strong), HER2 negative (IHC 1+) invasive ductal carcinoma. Ki-67 5% Tumor 2: 8 mm Grade 1 ER+ (>75%, strong), PR+ (>75%, strong), HER2 negative (IHC 2+ and FISH negative) invasive ductal carcinoma. Ki-6 5-10% Tumor 3: 18 mm Grade 2 ER+ (>75%, strong), PR+ (1-25%, weak) HER2 negative (IHC 1+) invasive lobular carcinoma. Ki-67 5-10% Tumor 4: 2.2 mm Grade 1 ER+ (>75%, strong), PR+ (>75%, strong) HER2 negative (IHC 2+ and FISH negative) invasive ductal carcinoma. Ki-67 5-10%  Associated Grade 2 DCIS. All true margins negative, No LVI, Single SLN involved, contains isolated tumor cells (0.16 mm). No extra nodal extention.   OncotypeDX has been sent on the 18 mm tumor and is 9   06/26/2023 Cancer Staging  Staging form: Breast, AJCC 8th Edition - Pathologic stage from 06/26/2023: Stage IA (pT1c, pN0(i+)(sn), cM0, G2, ER+, PR+, HER2-, Oncotype DX score: 9) -  Signed by Babara Call, MD on 07/01/2023 Stage prefix: Initial diagnosis Method of lymph node assessment: Sentinel lymph node biopsy Multigene prognostic tests performed: Oncotype DX Recurrence score range: Less than 11 Histologic grading system: 3 grade system   07/08/2023 - 08/28/2023 Radiation Therapy   S/p adjuvant radiation     Status post adjuvant radiation.  Today she feels well.  No new complaints.    MEDICAL HISTORY:  Past Medical History:  Diagnosis Date   Allergy    Bilateral swelling of feet and ankles    Family history of colon cancer    Family history of prostate cancer    Gallbladder polyp 2012   GERD (gastroesophageal reflux disease)    Hyperlipidemia    Hypertension    Iron  deficiency anemia    Menopausal symptoms    since 2007   Murmur, cardiac    Other fatigue    Prediabetes    Pulmonary embolism (HCC) 06/2020   Shortness of breath    with exertion; patient going to pulmonary rehabilitation   Shortness of breath on exertion    Sleep apnea    Uses CPAP   Vitamin D  deficiency     SURGICAL HISTORY: Past Surgical History:  Procedure Laterality Date   BREAST BIOPSY Left 03/17/2023   US  bx,Ribbon Clip, path pending   BREAST BIOPSY Left 03/17/2023   US  Bx, Coil Clip,path pending   BREAST BIOPSY Left 03/17/2023   US  LT BREAST BX W LOC DEV 1ST LESION IMG BX SPEC US  GUIDE 03/17/2023 [facility]   BREAST BIOPSY Left 03/17/2023   US  LT BREAST BX W LOC DEV EA ADD LESION IMG BX SPEC US  GUIDE 03/17/2023 [facility]   COLONOSCOPY  2010   2020   HYSTEROSCOPY WITH D & C N/A 07/27/2019   Procedure: DILATATION AND CURETTAGE /HYSTEROSCOPY, Polypectomy;  Surgeon: Herchel Gloris LABOR, MD;  Location: [facility];  Service: Gynecology;  Laterality: N/A;   HYSTEROSCOPY WITH D & C N/A 06/26/2021   Procedure: DILATATION AND CURETTAGE /HYSTEROSCOPY;  Surgeon: Herchel Gloris LABOR, MD;  Location: [facility];  Service: Gynecology;  Laterality:  N/A;   INTRAUTERINE DEVICE (IUD) INSERTION N/A 06/26/2021   Procedure: INTRAUTERINE DEVICE (IUD) INSERTION;  Surgeon: Herchel Gloris LABOR, MD;  Location: [facility];  Service: Gynecology;  Laterality: N/A;    SOCIAL HISTORY: Social History   Socioeconomic History   Marital status: Legally Separated    Spouse name: Not on file   Number of children: 1   Years of education: Not on file   Highest education level: Bachelor's degree (e.g., BA, AB, BS)  Occupational History   Occupation: Retired  Tobacco Use   Smoking status: Former    Current packs/day: 0.00    Average packs/day: 1 pack/day for 27.0 years (27.0 ttl pk-yrs)    Types: Cigarettes    Start date: 54    Quit date: 1999    Years since quitting: 26.5   Smokeless tobacco: Never  Vaping Use   Vaping status: Never Used  Substance and Sexual Activity   Alcohol use: Yes    Alcohol/week: 7.0 standard drinks of alcohol    Types: 7 Glasses of wine per week    Comment: 1-2 glasses of wine per night   Drug use: No   Sexual activity: Yes  Other Topics Concern  Not on file  Social History Narrative   Married.   1 child, 1 grandchild.   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   Enjoys going to [employer], singing.    Social Drivers of Health   Financial Resource Strain: High Risk (03/19/2023)   Overall Financial Resource Strain (CARDIA)    Difficulty of Paying Living Expenses: Hard  Food Insecurity: Food Insecurity Present (04/14/2023)   Hunger Vital Sign    Worried About Running Out of Food in the Last Year: Sometimes true    Ran Out of Food in the Last Year: Sometimes true  Transportation Needs: No Transportation Needs (04/14/2023)   PRAPARE - Administrator, Civil Service (Medical): No    Lack of Transportation (Non-Medical): No  Physical Activity: Inactive (03/19/2023)   Exercise Vital Sign    Days of Exercise per Week: 0 days    Minutes of Exercise per  Session: 10 min  Stress: No Stress Concern Present (03/19/2023)   [employer] of Occupational Health - Occupational Stress Questionnaire    Feeling of Stress : Not at all  Social Connections: Moderately Integrated (03/19/2023)   Social Connection and Isolation Panel    Frequency of Communication with Friends and Family: More than three times a week    Frequency of Social Gatherings with Friends and Family: Three times a week    Attends Religious Services: More than 4 times per year    Active Member of Clubs or Organizations: Yes    Attends Banker Meetings: More than 4 times per year    Marital Status: Separated  Intimate Partner Violence: Not At Risk (04/14/2023)   Humiliation, Afraid, Rape, and Kick questionnaire    Fear of Current or Ex-Partner: No    Emotionally Abused: No    Physically Abused: No    Sexually Abused: No    FAMILY HISTORY: Family History  Problem Relation Age of Onset   Depression Mother    Stroke Mother    Sudden death Mother    Heart disease Mother    Hypertension Mother    Heart attack Mother 70   Cancer Father    Prostate cancer Father        metastatic   Heart disease Father 4       CAD, STENT   Coronary artery disease Sister        stent x 2   Coronary artery disease Sister 42       stent placed   Hypertension Sister    Hypertension Sister    Heart disease Brother        Myocardial infarction   Colon cancer Maternal Aunt    Sudden death Other    Depression Other    Colon polyps Neg Hx    Esophageal cancer Neg Hx    Rectal cancer Neg Hx    Stomach cancer Neg Hx    Breast cancer Neg Hx     ALLERGIES:  has no known allergies.  MEDICATIONS:  Current Outpatient Medications  Medication Sig Dispense Refill   albuterol  (VENTOLIN  HFA) 108 (90 Base) MCG/ACT inhaler Inhale 2 puffs into the lungs every 6 (six) hours as needed for wheezing or shortness of breath. 8 g 2   amLODipine  (NORVASC ) 10 MG tablet Take 1 tablet (10 mg total)  by mouth daily. for blood pressure. 14 tablet 0   aspirin  EC 81 MG tablet Take 81 mg by mouth daily. Swallow whole.  Cholecalciferol (VITAMIN D3) 1000 UNITS tablet Take 1,000 Units by mouth daily.     Iron -Vitamin C  65-125 MG TABS Take 1 tablet by mouth daily. 30 tablet 2   loratadine (CLARITIN) 10 MG tablet Take 10 mg by mouth daily.     losartan -hydrochlorothiazide  (HYZAAR) 100-25 MG tablet Take 1 tablet by mouth daily. .for blood pressure. 14 tablet 0   Melatonin 10 MG TABS Take by mouth as needed.     omeprazole  (PRILOSEC) 20 MG capsule Take 1 capsule (20 mg total) by mouth daily. for heartburn. 14 capsule 0   Oyster Shell (OYSTER CALCIUM ) 500 MG TABS tablet Take 600 mg of elemental calcium  by mouth 2 (two) times daily.     Potassium Bicarbonate  99 MG CAPS Take 1 capsule (99 mg total) by mouth in the morning and at bedtime. 180 capsule 1   rosuvastatin  (CRESTOR ) 20 MG tablet Take 1 tablet by mouth once daily 90 tablet 1   anastrozole (ARIMIDEX) 1 MG tablet Take 1 tablet (1 mg total) by mouth daily. 30 tablet 3   fluticasone  (FLONASE ) 50 MCG/ACT nasal spray Place 1 spray into both nostrils 2 (two) times daily as needed for allergies or rhinitis. (Patient not taking: Reported on 09/10/2023) 16 g 0   No current facility-administered medications for this visit.    Review of Systems  Constitutional:  Negative for appetite change, chills, fatigue and fever.  HENT:   Negative for hearing loss and voice change.   Eyes:  Negative for eye problems.  Respiratory:  Negative for chest tightness and cough.   Cardiovascular:  Negative for chest pain.  Gastrointestinal:  Negative for abdominal distention, abdominal pain and blood in stool.  Endocrine: Negative for hot flashes.  Genitourinary:  Negative for difficulty urinating and frequency.   Musculoskeletal:  Negative for arthralgias.  Skin:  Negative for itching and rash.  Neurological:  Negative for extremity weakness.  Hematological:   Negative for adenopathy.  Psychiatric/Behavioral:  Negative for confusion.      PHYSICAL EXAMINATION: Vitals:   09/10/23 1420  BP: (!) 126/59  Pulse: 65  Resp: 20  Temp: 97.8 F (36.6 C)  SpO2: 100%   Filed Weights   09/10/23 1420  Weight: 253 lb 3.2 oz (114.9 kg)    Physical Exam Constitutional:      General: She is not in acute distress.    Appearance: She is obese. She is not diaphoretic.  HENT:     Head: Normocephalic and atraumatic.   Eyes:     General: No scleral icterus.   Cardiovascular:     Rate and Rhythm: Normal rate and regular rhythm.     Heart sounds: Murmur heard.  Pulmonary:     Effort: Pulmonary effort is normal. No respiratory distress.  Abdominal:     General: Bowel sounds are normal. There is no distension.     Palpations: Abdomen is soft.   Musculoskeletal:        General: Normal range of motion.     Cervical back: Normal range of motion and neck supple.   Skin:    Findings: No erythema.   Neurological:     Mental Status: She is alert and oriented to person, place, and time. Mental status is at baseline.     Motor: No abnormal muscle tone.   Psychiatric:        Mood and Affect: Mood and affect normal.     Left breast status postlumpectomy with well-healed scar. Status post bilateral breast reduction  LABORATORY DATA:  I have reviewed the data as listed    Latest Ref Rng & Units 09/10/2023    2:10 PM 05/11/2023    3:27 PM 03/26/2023    4:15 PM  CBC  WBC 4.0 - 10.5 K/uL 4.1  4.7  11.0   Hemoglobin 12.0 - 15.0 g/dL 88.1  88.2  89.3   Hematocrit 36.0 - 46.0 % 36.2  36.6  33.9   Platelets 150 - 400 K/uL 222  331.0  437       Latest Ref Rng & Units 09/10/2023    2:10 PM 05/11/2023    3:27 PM 03/26/2023    4:15 PM  CMP  Glucose 70 - 99 mg/dL 892  93  899   BUN 8 - 23 mg/dL 13  10  15    Creatinine 0.44 - 1.00 mg/dL 9.16  9.24  9.31   Sodium 135 - 145 mmol/L 138  143  140   Potassium 3.5 - 5.1 mmol/L 3.3  3.8  3.3   Chloride 98 -  111 mmol/L 102  102  105   CO2 22 - 32 mmol/L 28  32  27   Calcium  8.9 - 10.3 mg/dL 9.8  89.6  9.9   Total Protein 6.5 - 8.1 g/dL 7.2   7.9   Total Bilirubin 0.0 - 1.2 mg/dL 0.6   0.5   Alkaline Phos 38 - 126 U/L 59   68   AST 15 - 41 U/L 19   19   ALT 0 - 44 U/L 13   14      RADIOGRAPHIC STUDIES: I have personally reviewed the radiological images as listed and agreed with the findings in the report. No results found.

## 2023-09-10 NOTE — Assessment & Plan Note (Signed)
 Intermittent hypokalemia, this is a chronic issue for her.  She is on calcium  supplementation.  Managed by primary care provider

## 2023-09-10 NOTE — Assessment & Plan Note (Addendum)
 Lab Results  Component Value Date   HGB 11.8 (L) 09/10/2023   TIBC 482 (H) 03/26/2023   IRONPCTSAT 5 (L) 03/26/2023   FERRITIN 14 03/26/2023    Recommend patient to take Vitron C 1 tab daily.

## 2023-09-10 NOTE — Progress Notes (Signed)
 SUBJECTIVE: Pt returns for her 3 month L-Dex screen.    PAIN:  Are you having pain? No   SOZO SCREENING: Patient was assessed today using the SOZO machine to determine the lymphedema index score. This was compared to her baseline score. It was determined that she is within the recommended range when compared to her baseline and no further action is needed at this time. She will continue SOZO screenings. These are done every 3 months for 2 years post operatively followed by every 6 months for 2 years, and then annually.     L-DEX FLOWSHEETS                L-DEX LYMPHEDEMA SCREENING    Measurement Type Unilateral     L-DEX MEASUREMENT EXTREMITY Upper Extremity     POSITION  Standing     DOMINANT SIDE Right     At Risk Side Left     BASELINE SCORE (UNILATERAL) 4.4    L-DEX SCORE (UNILATERAL) 6.7    VALUE CHANGE (UNILAT) 2.3

## 2023-09-10 NOTE — Assessment & Plan Note (Signed)
 Provoked by OCP use.  Currently not on anticoagulation.

## 2023-09-10 NOTE — Assessment & Plan Note (Signed)
 Patient is interested in obtaining genetic testing.  Refer to Dentist.

## 2023-09-10 NOTE — Assessment & Plan Note (Signed)
 Recommend calcium and vitamin D supplementation.

## 2023-09-10 NOTE — Assessment & Plan Note (Addendum)
 Left multifocal stage I pT1c (m) pN0 (i+)  ER/PR positive HER2 negative invasive ductal carcinoma. Status post left breast lumpectomy and bilateral breast reduction. Oncotype DX sent on largest breast mass came back at 9. no benefit for adjuvant chemotherapy. S/p adjuvant radiation followed  recommend adjuvant endocrine therapy with aromatase inhibitor. Rationale of using aromatase inhibitor -Arimidex  discussed with patient.  Side effects of Arimidex including but not limited to hot flash, muscle and joint pain, fatigue, mood swing, vaginal dryness/inching, loss of bone mineral density,hair thinning, heart problem, etc were discussed with patient.  Patient voices understanding and willing to proceed.

## 2023-09-23 ENCOUNTER — Encounter: Payer: Self-pay | Admitting: Cardiovascular Disease

## 2023-09-23 ENCOUNTER — Telehealth: Payer: Self-pay | Admitting: Cardiovascular Disease

## 2023-09-23 NOTE — Progress Notes (Signed)
  Radiation Oncology         [phone] ________________________________  Name: ADEN YOUNGMAN MRN: [id number]  Date of Service: 09/28/2023  DOB: 07-May-1955  Post Treatment Telephone Note  Diagnosis:  Stage IA, pT1cN1M0, grade 2, ER/PR positive invasive ductal carcinoma of hte left breast. (as documented in provider EOT note)   The patient was not available for call today. Unable to reach patient for today's appointment. 2 calls. No answer. Message was left w/ my extension [phone]. Call complete.  The patient was encouraged to avoid sun exposure in the area of prior treatment for up to one year following radiation with either sunscreen or by the style of clothing worn in the sun.  The patient has scheduled follow up with her medical oncologist Dr. Babara for ongoing surveillance, and was encouraged to call if she develops concerns or questions regarding radiation.   This concludes the interaction.  Rosaline Minerva, LPN

## 2023-09-23 NOTE — Telephone Encounter (Signed)
 Called patient, unable to get samples at this time- patient leaves out early in the morning. Patient is aware to call me when she gets to [place] - and I can send a short supply to another pharmacy nearby.  Patient thankful for call back.

## 2023-09-23 NOTE — Telephone Encounter (Signed)
 Pt c/o medication issue:  1. Name of Medication:   Eliquis   2. How are you currently taking this medication (dosage and times per day)?   3. Are you having a reaction (difficulty breathing--STAT)?   4. What is your medication issue?   Patient stated she has a family emergency and will need to fly to [place]  tomorrow and wants to pick up a sample of this medication today.

## 2023-09-24 NOTE — Telephone Encounter (Signed)
 Patient called back to provider the address where she will be  [address] [place] [address]  Please advise

## 2023-09-24 NOTE — Telephone Encounter (Signed)
 Returned call to patient inquiring if the [address] [place], [place] address was the pharmacy that we can send a prescription of Eliquis  and she stated that was the address she was staying.  Pt states that she only needs 2 tablets of Eliquis  2.5 mg for her return trip  and to have [facility], [address], [facility], [place] as she was unable to come by clinic to obtain samples.   I explained to pt that I would send a prescription request to Dr. Gollan.  Pt thanked me for the return call and attending to this request.

## 2023-09-28 ENCOUNTER — Ambulatory Visit
Admission: RE | Admit: 2023-09-28 | Discharge: 2023-09-28 | Disposition: A | Discharge status: Home / Self Care | Source: Ambulatory Visit | Attending: Primary Care | Admitting: Primary Care

## 2023-09-28 ENCOUNTER — Other Ambulatory Visit: Payer: Self-pay | Admitting: *Deleted

## 2023-09-28 MED ORDER — APIXABAN 2.5 MG PO TABS: 2.5000 mg | ORAL_TABLET | ORAL | 0 refills | Status: DC | PRN

## 2023-09-28 NOTE — Telephone Encounter (Signed)
 RX sent by another triage nurse.

## 2023-09-28 NOTE — Telephone Encounter (Signed)
 Pt calling saying pharmacy does not have script. She would like a call back.

## 2023-09-30 NOTE — Telephone Encounter (Signed)
 See MyChart encounter

## 2023-10-08 ENCOUNTER — Telehealth: Payer: Self-pay | Admitting: Primary Care

## 2023-10-08 ENCOUNTER — Telehealth: Payer: Self-pay | Admitting: Cardiovascular Disease

## 2023-10-08 NOTE — Telephone Encounter (Signed)
*  STAT* If patient is at the pharmacy, call can be transferred to refill team.   1. Which medications need to be refilled? (please list name of each medication and dose if known)  apixaban  (ELIQUIS ) 2.5 MG TABS tablet  2. Which pharmacy/location (including street and city if local pharmacy) is medication to be sent to? [facility] [address] [place], [place] - [address] Phone: [phone]  Fax: [phone]     3. Do they need a 30 day or 90 day supply? 4 Tablets  she is flying 2 to fly out and 2 to fly back. She is Leaving 7/30 so she needs before then

## 2023-10-08 NOTE — Telephone Encounter (Signed)
 This is documented in appt notes.

## 2023-10-08 NOTE — Telephone Encounter (Signed)
 Copied from CRM [id number]. Topic: Appointments - Scheduling Inquiry for Clinic >> Oct 08, 2023  9:42 AM Paige Hester wrote: Reason for CRM: Patient returned call pertaining to whether she wanted an OV or CPE. Patient confirm that she just wants an OV not a CPE.

## 2023-10-09 ENCOUNTER — Ambulatory Visit (INDEPENDENT_AMBULATORY_CARE_PROVIDER_SITE_OTHER)
Admission: RE | Admit: 2023-10-09 | Discharge: 2023-10-09 | Disposition: A | Discharge status: Home / Self Care | Source: Ambulatory Visit | Attending: Primary Care | Admitting: Primary Care

## 2023-10-09 ENCOUNTER — Ambulatory Visit: Payer: Self-pay | Admitting: Primary Care

## 2023-10-09 ENCOUNTER — Other Ambulatory Visit: Payer: Self-pay

## 2023-10-09 ENCOUNTER — Encounter: Payer: Self-pay | Admitting: Primary Care

## 2023-10-09 ENCOUNTER — Ambulatory Visit: Admitting: Primary Care

## 2023-10-09 VITALS — BP 116/68 | HR 68 | Temp 97.9°F | Ht 65.0 in | Wt 250.0 lb

## 2023-10-09 DIAGNOSIS — G4733 Obstructive sleep apnea (adult) (pediatric): Secondary | ICD-10-CM

## 2023-10-09 DIAGNOSIS — I700 Atherosclerosis of aorta: Secondary | ICD-10-CM | POA: Diagnosis not present

## 2023-10-09 DIAGNOSIS — I6523 Occlusion and stenosis of bilateral carotid arteries: Secondary | ICD-10-CM | POA: Diagnosis not present

## 2023-10-09 DIAGNOSIS — M545 Low back pain, unspecified: Secondary | ICD-10-CM | POA: Diagnosis not present

## 2023-10-09 DIAGNOSIS — I10 Essential (primary) hypertension: Secondary | ICD-10-CM | POA: Diagnosis not present

## 2023-10-09 DIAGNOSIS — R21 Rash and other nonspecific skin eruption: Secondary | ICD-10-CM

## 2023-10-09 DIAGNOSIS — C50212 Malignant neoplasm of upper-inner quadrant of left female breast: Secondary | ICD-10-CM

## 2023-10-09 DIAGNOSIS — E782 Mixed hyperlipidemia: Secondary | ICD-10-CM | POA: Diagnosis not present

## 2023-10-09 DIAGNOSIS — K219 Gastro-esophageal reflux disease without esophagitis: Secondary | ICD-10-CM

## 2023-10-09 DIAGNOSIS — M549 Dorsalgia, unspecified: Secondary | ICD-10-CM | POA: Insufficient documentation

## 2023-10-09 DIAGNOSIS — R7303 Prediabetes: Secondary | ICD-10-CM | POA: Diagnosis not present

## 2023-10-09 LAB — LIPID PANEL
Cholesterol: 122 mg/dL (ref 0–200)
HDL: 54.9 mg/dL (ref >39.00)
LDL Cholesterol: 58 mg/dL (ref 0–99)
NonHDL: 67.14
Total CHOL/HDL Ratio: 2
Triglycerides: 46 mg/dL (ref 0.0–149.0)
VLDL: 9.2 mg/dL (ref 0.0–40.0)

## 2023-10-09 LAB — HEMOGLOBIN A1C: Hgb A1c MFr Bld: 6.2 % (ref 4.6–6.5)

## 2023-10-09 MED ORDER — APIXABAN 2.5 MG PO TABS: 2.5000 mg | ORAL_TABLET | ORAL | 1 refills | Status: DC | PRN

## 2023-10-09 MED ORDER — TRIAMCINOLONE ACETONIDE 0.5 % EX OINT: 1.0000 | TOPICAL_OINTMENT | Freq: Two times a day (BID) | CUTANEOUS | 0 refills | Status: DC | PRN

## 2023-10-09 NOTE — Assessment & Plan Note (Signed)
 Repeat A1c pending

## 2023-10-09 NOTE — Assessment & Plan Note (Signed)
Controlled.   Continue omeprazole 20 mg daily. 

## 2023-10-09 NOTE — Progress Notes (Signed)
 Subjective:    Patient ID: Paige Hester, female    DOB: Jan 26, 1956, 68 y.o.   MRN: [id number]  Fall  Rash    Paige Hester is a very pleasant 68 y.o. female with a history of hypertension, cardiomegaly, OSA, GERD, osteopenia, iron  deficiency anemia, breast cancer, prediabetes who presents today for follow-up of chronic issues.    Immunizations: -Shingles: Completed Shingrix  series -Pneumonia: Completed Prevnar 20 in 2022, Pneumovax 23 in 2023  Diet: Fair diet.  Exercise: No regular exercise.  Mammogram: Completed in January 2025 Bone Density Scan: Completed in December 2024  Colonoscopy: Completed in 2020, due 2027   1) Hypertension/Carotid Artery Stenosis/OSA: Currently managed on amlodipine  10 mg daily, losartan -hydrochlorothiazide  100-25 mg daily.  Follow with cardiology, last office visit was in April 2025.  Last carotid duplex was completed in January 2025 which showed bilateral ICA stenosis 1 to 39%, bilateral ECA greater than 50%.  Plan is to repeat carotid ultrasound in January 2026.  Her last echocardiogram was in March 2025, LVEF of greater than 75%, severe concentric left ventricular hypertrophy, grade 2 diastolic dysfunction.  BP Readings from Last 3 Encounters:  10/09/23 116/68  09/10/23 (!) 126/59  07/01/23 (!) 134/54     2) Breast Cancer: Will be starting anastrozole  1 mg daily next week.  Following with oncology, last office visit was in June 2025.  Last mammogram was completed in January 2025.  She is status post left breast lumpectomy and bilateral breast reduction.   3) Hyperlipidemia/Prediabetes: Currently managed on rosuvastatin  20 mg daily.  Last A1c was 6.0 in February 2025.  Last lipid panel with LDL 61, HDL 43, Triggs of 66 in September 2024.  4) Back Pain: Symptom onset 2 weeks ago with right lower back and buttock pain. She was walking to open up her screen front door. She noticed a wasp, fell onto her left side.   Her pain is is  worse with walking and standing for a prolonged period of time. Her pain is intermittent, dull and achy. She stretches everyday.   She denies radiation down lower extremity and denies numbness.   5) Rash: Two days ago she noticed a hypopigmented rash to the left upper extremity distal to antecubital fossa. She denies itching and pain. She's not applied anything OTC.   Review of Systems  Skin:  Positive for rash.         Past Medical History:  Diagnosis Date   Allergy    Bilateral swelling of feet and ankles    Family history of colon cancer    Family history of prostate cancer    Gallbladder polyp 2012   GERD (gastroesophageal reflux disease)    Hyperlipidemia    Hypertension    Iron  deficiency anemia    Menopausal symptoms    since 2007   Murmur, cardiac    Other fatigue    Prediabetes    Pulmonary embolism (HCC) 06/2020   Shortness of breath    with exertion; patient going to pulmonary rehabilitation   Shortness of breath on exertion    Sleep apnea    Uses CPAP   Vitamin D  deficiency     Social History   Socioeconomic History   Marital status: Legally Separated    Spouse name: Not on file   Number of children: 1   Years of education: Not on file   Highest education level: Bachelor's degree (e.g., BA, AB, BS)  Occupational History   Occupation: Retired  Tobacco Use  Smoking status: Former    Current packs/day: 0.00    Average packs/day: 1 pack/day for 27.0 years (27.0 ttl pk-yrs)    Types: Cigarettes    Start date: 26    Quit date: 1999    Years since quitting: 26.5   Smokeless tobacco: Never  Vaping Use   Vaping status: Never Used  Substance and Sexual Activity   Alcohol use: Yes    Alcohol/week: 7.0 standard drinks of alcohol    Types: 7 Glasses of wine per week    Comment: 1-2 glasses of wine per night   Drug use: No   Sexual activity: Yes  Other Topics Concern   Not on file  Social History Narrative   Married.   1 child, 1 grandchild.    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   Enjoys going to [employer], singing.    Social Drivers of Health   Financial Resource Strain: Medium Risk (10/03/2023)   Overall Financial Resource Strain (CARDIA)    Difficulty of Paying Living Expenses: Somewhat hard  Food Insecurity: Food Insecurity Present (10/03/2023)   Hunger Vital Sign    Worried About Running Out of Food in the Last Year: Sometimes true    Ran Out of Food in the Last Year: Sometimes true  Transportation Needs: Unmet Transportation Needs (10/03/2023)   PRAPARE - Administrator, Civil Service (Medical): No    Lack of Transportation (Non-Medical): Yes  Physical Activity: Inactive (10/03/2023)   Exercise Vital Sign    Days of Exercise per Week: 0 days    Minutes of Exercise per Session: Not on file  Stress: No Stress Concern Present (10/03/2023)   [employer] of Occupational Health - Occupational Stress Questionnaire    Feeling of Stress: Not at all  Social Connections: Moderately Integrated (10/03/2023)   Social Connection and Isolation Panel    Frequency of Communication with Friends and Family: More than three times a week    Frequency of Social Gatherings with Friends and Family: Three times a week    Attends Religious Services: More than 4 times per year    Active Member of Clubs or Organizations: Yes    Attends Banker Meetings: More than 4 times per year    Marital Status: Separated  Intimate Partner Violence: Not At Risk (04/14/2023)   Humiliation, Afraid, Rape, and Kick questionnaire    Fear of Current or Ex-Partner: No    Emotionally Abused: No    Physically Abused: No    Sexually Abused: No    Past Surgical History:  Procedure Laterality Date   BREAST BIOPSY Left 03/17/2023   US  bx,Ribbon Clip, path pending   BREAST BIOPSY Left 03/17/2023   US  Bx, Coil Clip,path pending   BREAST BIOPSY Left 03/17/2023   US  LT BREAST BX W LOC DEV 1ST LESION  IMG BX SPEC US  GUIDE 03/17/2023 [facility]   BREAST BIOPSY Left 03/17/2023   US  LT BREAST BX W LOC DEV EA ADD LESION IMG BX SPEC US  GUIDE 03/17/2023 [facility]   COLONOSCOPY  2010   2020   HYSTEROSCOPY WITH D & C N/A 07/27/2019   Procedure: DILATATION AND CURETTAGE /HYSTEROSCOPY, Polypectomy;  Surgeon: Herchel Gloris LABOR, MD;  Location: [facility];  Service: Gynecology;  Laterality: N/A;   HYSTEROSCOPY WITH D & C N/A 06/26/2021   Procedure: DILATATION AND CURETTAGE /HYSTEROSCOPY;  Surgeon: Herchel Gloris LABOR, MD;  Location: [facility];  Service: Gynecology;  Laterality: N/A;   INTRAUTERINE DEVICE (IUD) INSERTION N/A 06/26/2021   Procedure: INTRAUTERINE DEVICE (IUD) INSERTION;  Surgeon: Herchel Gloris LABOR, MD;  Location: [facility];  Service: Gynecology;  Laterality: N/A;    Family History  Problem Relation Age of Onset   Depression Mother    Stroke Mother    Sudden death Mother    Heart disease Mother    Hypertension Mother    Heart attack Mother 70   Cancer Father    Prostate cancer Father        metastatic   Heart disease Father 4       CAD, STENT   Coronary artery disease Sister        stent x 2   Coronary artery disease Sister 34       stent placed   Hypertension Sister    Hypertension Sister    Heart disease Brother        Myocardial infarction   Colon cancer Maternal Aunt    Sudden death Other    Depression Other    Colon polyps Neg Hx    Esophageal cancer Neg Hx    Rectal cancer Neg Hx    Stomach cancer Neg Hx    Breast cancer Neg Hx     No Known Allergies  Current Outpatient Medications on File Prior to Visit  Medication Sig Dispense Refill   albuterol  (VENTOLIN  HFA) 108 (90 Base) MCG/ACT inhaler Inhale 2 puffs into the lungs every 6 (six) hours as needed for wheezing or shortness of breath. 8 g 2   amLODipine  (NORVASC ) 10 MG tablet Take 1 tablet (10 mg total) by mouth daily. for blood pressure. 14  tablet 0   anastrozole  (ARIMIDEX ) 1 MG tablet Take 1 tablet (1 mg total) by mouth daily. 30 tablet 3   aspirin  EC 81 MG tablet Take 81 mg by mouth daily. Swallow whole.     Cholecalciferol (VITAMIN D3) 1000 UNITS tablet Take 1,000 Units by mouth daily.     fluticasone  (FLONASE ) 50 MCG/ACT nasal spray Place 1 spray into both nostrils 2 (two) times daily as needed for allergies or rhinitis. 16 g 0   Iron -Vitamin C  65-125 MG TABS Take 1 tablet by mouth daily. 30 tablet 2   loratadine (CLARITIN) 10 MG tablet Take 10 mg by mouth daily.     losartan -hydrochlorothiazide  (HYZAAR) 100-25 MG tablet Take 1 tablet by mouth daily. .for blood pressure. 14 tablet 0   Melatonin 10 MG TABS Take by mouth as needed.     omeprazole  (PRILOSEC) 20 MG capsule Take 1 capsule (20 mg total) by mouth daily. for heartburn. 14 capsule 0   Oyster Shell (OYSTER CALCIUM ) 500 MG TABS tablet Take 600 mg of elemental calcium  by mouth 2 (two) times daily.     Potassium Bicarbonate  99 MG CAPS Take 1 capsule (99 mg total) by mouth in the morning and at bedtime. 180 capsule 1   rosuvastatin  (CRESTOR ) 20 MG tablet Take 1 tablet by mouth once daily 90 tablet 1   No current facility-administered medications on file prior to visit.    BP 116/68   Pulse 68   Temp 97.9 F (36.6 C) (Temporal)   Ht 5' 5 (1.651 m)   Wt 250 lb (113.4 kg)   SpO2 98%   BMI 41.60 kg/m  Objective:   Physical Exam Cardiovascular:     Rate and Rhythm: Normal rate and regular rhythm.  Pulmonary:     Effort: Pulmonary effort  is normal.     Breath sounds: Normal breath sounds.  Musculoskeletal:     Cervical back: Neck supple.  Skin:    General: Skin is warm and dry.     Findings: Rash present.     Comments: Mildly erythematous rash to left upper extremity distal to antecubital fossa.  Neurological:     Mental Status: She is alert and oriented to person, place, and time.  Psychiatric:        Mood and Affect: Mood normal.            Assessment & Plan:  Acute right-sided low back pain without sciatica Assessment & Plan: Since mild fall.  Exam and HPI today without alarms. Will obtain plain films of the lumbar spine today. Offered physical therapy for which she kindly declines.  She will update.  We discussed the importance of walking and stretching.  Orders: -     DG Lumbar Spine Complete  Aortic atherosclerosis (HCC) Assessment & Plan: Repeat lipid panel pending. Continue rosuvastatin  20 mg daily.   Bilateral carotid artery stenosis Assessment & Plan: Reviewed ultrasound from 2025.  Continue rosuvastatin  20 mg daily, aspirin  81 mg daily.   Essential hypertension Assessment & Plan: Controlled.  Continue amlodipine  10 mg daily, losartan -hydrochlorothiazide  100-25 mg daily. CMP reviewed from June 2025.   OSA (obstructive sleep apnea) Assessment & Plan: Continue CPAP nightly.   Gastroesophageal reflux disease without esophagitis Assessment & Plan: Controlled.  Continue omeprazole  20 mg daily.   Mixed hyperlipidemia Assessment & Plan: Repeat lipid panel pending.  Continue rosuvastatin  20 mg daily.  Orders: -     Lipid panel  Prediabetes Assessment & Plan: Repeat A1c pending.  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  Start anastrozole  1 mg daily as planned.   Rash and nonspecific skin eruption Assessment & Plan: Exam today consistent for eczema.  Start triamcinolone  0.5% cream twice daily as needed. She will update if no improvement.  Orders: -     Triamcinolone  Acetonide; Apply 1 Application topically 2 (two) times daily as needed.  Dispense: 30 g; Refill: 0        Comer MARLA Gaskins, NP

## 2023-10-09 NOTE — Assessment & Plan Note (Signed)
 Exam today consistent for eczema.  Start triamcinolone  0.5% cream twice daily as needed. She will update if no improvement.

## 2023-10-09 NOTE — Assessment & Plan Note (Signed)
Continue CPAP nightly. °

## 2023-10-09 NOTE — Assessment & Plan Note (Signed)
 Controlled.  Continue amlodipine  10 mg daily, losartan -hydrochlorothiazide  100-25 mg daily. CMP reviewed from June 2025.

## 2023-10-09 NOTE — Patient Instructions (Addendum)
 Stop by the lab and x-ray prior to leaving today. I will notify you of your results once received.   You may apply the triamcinolone  ointment twice daily for 1 to 2 weeks.  It was a pleasure to see you today!

## 2023-10-09 NOTE — Assessment & Plan Note (Signed)
 Reviewed ultrasound from 2025.  Continue rosuvastatin  20 mg daily, aspirin  81 mg daily.

## 2023-10-09 NOTE — Assessment & Plan Note (Signed)
 Repeat lipid panel pending. ? ?Continue rosuvastatin 20 mg daily. ?

## 2023-10-09 NOTE — Assessment & Plan Note (Signed)
 Since mild fall.  Exam and HPI today without alarms. Will obtain plain films of the lumbar spine today. Offered physical therapy for which she kindly declines.  She will update.  We discussed the importance of walking and stretching.

## 2023-10-09 NOTE — Assessment & Plan Note (Signed)
 Following with oncology, office notes and labs reviewed from June 2025.  Start anastrozole  1 mg daily as planned.

## 2023-11-19 ENCOUNTER — Encounter: Payer: Self-pay | Admitting: Cardiovascular Disease

## 2023-12-03 ENCOUNTER — Inpatient Hospital Stay: Admitting: Oncology

## 2023-12-03 ENCOUNTER — Ambulatory Visit: Admitting: *Deleted

## 2023-12-03 ENCOUNTER — Inpatient Hospital Stay

## 2023-12-25 ENCOUNTER — Telehealth: Payer: Self-pay | Admitting: *Deleted

## 2023-12-25 NOTE — Telephone Encounter (Signed)
 Pt stated she is out of town and will need to r/s her sozo screen (10/16) pt stated it will need to be AFTER December 2nd.  I have canceled 10/16 and pt is aware you will call her with an updated appt.    Thank you

## 2023-12-29 ENCOUNTER — Ambulatory Visit: Admitting: Cardiovascular Disease

## 2023-12-30 ENCOUNTER — Telehealth: Payer: Self-pay | Admitting: Primary Care

## 2023-12-30 NOTE — Telephone Encounter (Unsigned)
 Copied from CRM #[id number]. Topic: Clinical - Medication Refill >> Dec 30, 2023 12:30 PM Alfonso ORN wrote: Medication:rosuvastatin  (CRESTOR ) 20 MG tablet  Has the patient contacted their pharmacy? Yes   This is the patient's preferred pharmacy:  [facility]  [address] [place] [phone]  Is this the correct pharmacy for this prescription? Yes   Has the prescription been filled recently? No, 3 months ago   Is the patient out of the medication? No  Has the patient been seen for an appointment in the last year OR does the patient have an upcoming appointment? Yes  Can we respond through MyChart? Yes  Pt called because she has been in [place]  since July and thought rx for rosuvastatin  (CRESTOR ) 20 MG tablet was transferred to     [facility]  [address] [place] [address]  . Please assist with transferring over

## 2023-12-31 ENCOUNTER — Ambulatory Visit: Admitting: *Deleted

## 2024-01-18 ENCOUNTER — Telehealth: Payer: Self-pay | Admitting: Cardiovascular Disease

## 2024-01-18 DIAGNOSIS — E782 Mixed hyperlipidemia: Secondary | ICD-10-CM

## 2024-01-18 MED ORDER — ROSUVASTATIN CALCIUM 20 MG PO TABS: 20.0000 mg | ORAL_TABLET | Freq: Every day | ORAL | 0 refills | Status: DC

## 2024-01-18 NOTE — Telephone Encounter (Signed)
*  STAT* If patient is at the pharmacy, call can be transferred to refill team.   1. Which medications need to be refilled? (please list name of each medication and dose if known)   rosuvastatin  (CRESTOR ) 20 MG tablet   2. Which pharmacy/location (including street and city if local pharmacy) is medication to be sent to? [facility] [address] [place] [address]   3. Do they need a 30 day or 90 day supply? 90

## 2024-01-18 NOTE — Telephone Encounter (Signed)
 Pt scheduled to see Dr. Gollan, 02/15/24.  Refill to [place], [place], [place].

## 2024-01-31 ENCOUNTER — Other Ambulatory Visit: Payer: Self-pay | Admitting: Oncology

## 2024-02-02 ENCOUNTER — Other Ambulatory Visit: Payer: Self-pay | Admitting: Oncology

## 2024-02-02 ENCOUNTER — Telehealth: Payer: Self-pay | Admitting: Cardiovascular Disease

## 2024-02-02 ENCOUNTER — Other Ambulatory Visit: Payer: Self-pay

## 2024-02-02 ENCOUNTER — Telehealth: Payer: Self-pay

## 2024-02-02 NOTE — Telephone Encounter (Signed)
 Patient calling to request refill of Anastrozole  be sent to [facility].  Informed her that refill request on 02/01/24 was denied because she is overdue f/u visit.  She states that she is in [place] until 02/17/2024 and wants to know if MD will go ahead a refill until she returns.

## 2024-02-02 NOTE — Telephone Encounter (Signed)
*  STAT* If patient is at the pharmacy, call can be transferred to refill team.   1. Which medications need to be refilled? (please list name of each medication and dose if known)   apixaban  (ELIQUIS ) 2.5 MG TABS tablet     2. Would you like to learn more about the convenience, safety, & potential cost savings by using the [facility]? No    3. Are you open to using the [facility] (Type [facility].  ). No    4. Which pharmacy/location (including street and city if local pharmacy) is medication to be sent to?[facility] [phone] [place], [place] [address]   5. Do they need a 30 day or 90 day supply? 2 pills for Emergency trip

## 2024-02-02 NOTE — Telephone Encounter (Signed)
 30 day supply of Anastrozole  sent to requested pharmacy in [place]. Pt was transferred to scheduling and follow up appt set up for 12/10. Additional refills will be sent at that visit.

## 2024-02-03 ENCOUNTER — Other Ambulatory Visit: Payer: Self-pay

## 2024-02-03 ENCOUNTER — Other Ambulatory Visit: Payer: Self-pay | Admitting: Cardiovascular Disease

## 2024-02-03 MED ORDER — APIXABAN 2.5 MG PO TABS: 2.5000 mg | ORAL_TABLET | ORAL | 1 refills | Status: DC | PRN

## 2024-02-15 ENCOUNTER — Ambulatory Visit: Admitting: Cardiovascular Disease

## 2024-02-19 ENCOUNTER — Other Ambulatory Visit: Payer: Self-pay | Admitting: Oncology

## 2024-02-24 ENCOUNTER — Inpatient Hospital Stay

## 2024-02-24 ENCOUNTER — Inpatient Hospital Stay: Attending: Oncology

## 2024-02-24 ENCOUNTER — Inpatient Hospital Stay (HOSPITAL_BASED_OUTPATIENT_CLINIC_OR_DEPARTMENT_OTHER): Admitting: Oncology

## 2024-02-24 ENCOUNTER — Other Ambulatory Visit: Payer: Self-pay

## 2024-02-24 ENCOUNTER — Encounter: Payer: Self-pay | Admitting: Oncology

## 2024-02-24 VITALS — BP 156/63 | HR 63 | Temp 98.0°F | Resp 18 | Ht 65.0 in | Wt 249.0 lb

## 2024-02-24 DIAGNOSIS — Z1732 Human epidermal growth factor receptor 2 negative status: Secondary | ICD-10-CM | POA: Diagnosis not present

## 2024-02-24 DIAGNOSIS — N644 Mastodynia: Secondary | ICD-10-CM | POA: Insufficient documentation

## 2024-02-24 DIAGNOSIS — D500 Iron deficiency anemia secondary to blood loss (chronic): Secondary | ICD-10-CM | POA: Diagnosis not present

## 2024-02-24 DIAGNOSIS — Z6841 Body Mass Index (BMI) 40.0 and over, adult: Secondary | ICD-10-CM

## 2024-02-24 DIAGNOSIS — Z86711 Personal history of pulmonary embolism: Secondary | ICD-10-CM | POA: Insufficient documentation

## 2024-02-24 DIAGNOSIS — Z7982 Long term (current) use of aspirin: Secondary | ICD-10-CM | POA: Insufficient documentation

## 2024-02-24 DIAGNOSIS — C50212 Malignant neoplasm of upper-inner quadrant of left female breast: Secondary | ICD-10-CM

## 2024-02-24 DIAGNOSIS — Z791 Long term (current) use of non-steroidal anti-inflammatories (NSAID): Secondary | ICD-10-CM | POA: Insufficient documentation

## 2024-02-24 DIAGNOSIS — Z8042 Family history of malignant neoplasm of prostate: Secondary | ICD-10-CM | POA: Insufficient documentation

## 2024-02-24 DIAGNOSIS — Z87891 Personal history of nicotine dependence: Secondary | ICD-10-CM | POA: Insufficient documentation

## 2024-02-24 DIAGNOSIS — K219 Gastro-esophageal reflux disease without esophagitis: Secondary | ICD-10-CM | POA: Insufficient documentation

## 2024-02-24 DIAGNOSIS — Z800 Family history of malignant neoplasm of digestive organs: Secondary | ICD-10-CM | POA: Insufficient documentation

## 2024-02-24 DIAGNOSIS — Z170 Estrogen receptor positive status [ER+]: Secondary | ICD-10-CM | POA: Diagnosis not present

## 2024-02-24 DIAGNOSIS — R5383 Other fatigue: Secondary | ICD-10-CM

## 2024-02-24 DIAGNOSIS — E785 Hyperlipidemia, unspecified: Secondary | ICD-10-CM | POA: Diagnosis not present

## 2024-02-24 DIAGNOSIS — Z79899 Other long term (current) drug therapy: Secondary | ICD-10-CM | POA: Insufficient documentation

## 2024-02-24 DIAGNOSIS — M8580 Other specified disorders of bone density and structure, unspecified site: Secondary | ICD-10-CM | POA: Diagnosis not present

## 2024-02-24 DIAGNOSIS — I10 Essential (primary) hypertension: Secondary | ICD-10-CM | POA: Insufficient documentation

## 2024-02-24 DIAGNOSIS — Z809 Family history of malignant neoplasm, unspecified: Secondary | ICD-10-CM | POA: Diagnosis not present

## 2024-02-24 DIAGNOSIS — Z1721 Progesterone receptor positive status: Secondary | ICD-10-CM | POA: Diagnosis not present

## 2024-02-24 DIAGNOSIS — L905 Scar conditions and fibrosis of skin: Secondary | ICD-10-CM | POA: Diagnosis not present

## 2024-02-24 DIAGNOSIS — D509 Iron deficiency anemia, unspecified: Secondary | ICD-10-CM | POA: Insufficient documentation

## 2024-02-24 LAB — CBC WITH DIFFERENTIAL (CANCER CENTER ONLY)
Abs Immature Granulocytes: 0.05 K/uL (ref 0.00–0.07)
Basophils Absolute: 0 K/uL (ref 0.0–0.1)
Basophils Relative: 1 %
Eosinophils Absolute: 0.8 K/uL — ABNORMAL HIGH (ref 0.0–0.5)
Eosinophils Relative: 13 %
HCT: 38.9 % (ref 36.0–46.0)
Hemoglobin: 12.7 g/dL (ref 12.0–15.0)
Immature Granulocytes: 1 %
Lymphocytes Relative: 23 %
Lymphs Abs: 1.3 K/uL (ref 0.7–4.0)
MCH: 28.3 pg (ref 26.0–34.0)
MCHC: 32.6 g/dL (ref 30.0–36.0)
MCV: 86.8 fL (ref 80.0–100.0)
Monocytes Absolute: 0.7 K/uL (ref 0.1–1.0)
Monocytes Relative: 11 %
Neutro Abs: 3.1 K/uL (ref 1.7–7.7)
Neutrophils Relative %: 51 %
Platelet Count: 278 K/uL (ref 150–400)
RBC: 4.48 MIL/uL (ref 3.87–5.11)
RDW: 13.9 % (ref 11.5–15.5)
WBC Count: 6 K/uL (ref 4.0–10.5)
nRBC: 0 % (ref 0.0–0.2)

## 2024-02-24 LAB — CMP (CANCER CENTER ONLY)
ALT: 11 U/L (ref 0–44)
AST: 21 U/L (ref 15–41)
Albumin: 4.2 g/dL (ref 3.5–5.0)
Alkaline Phosphatase: 76 U/L (ref 38–126)
Anion gap: 11 (ref 5–15)
BUN: 17 mg/dL (ref 8–23)
CO2: 26 mmol/L (ref 22–32)
Calcium: 10.7 mg/dL — ABNORMAL HIGH (ref 8.9–10.3)
Chloride: 104 mmol/L (ref 98–111)
Creatinine: 0.97 mg/dL (ref 0.44–1.00)
GFR, Estimated: >60 mL/min (ref >60)
Glucose, Bld: 98 mg/dL (ref 70–99)
Potassium: 4.1 mmol/L (ref 3.5–5.1)
Sodium: 141 mmol/L (ref 135–145)
Total Bilirubin: 0.7 mg/dL (ref 0.0–1.2)
Total Protein: 7.2 g/dL (ref 6.5–8.1)

## 2024-02-24 LAB — IRON AND TIBC
Iron: 57 ug/dL (ref 28–170)
Saturation Ratios: 18 % (ref 10.4–31.8)
TIBC: 316 ug/dL (ref 250–450)
UIBC: 260 ug/dL

## 2024-02-24 LAB — RETIC PANEL
Immature Retic Fract: 11.7 % (ref 2.3–15.9)
RBC.: 4.48 MIL/uL (ref 3.87–5.11)
Retic Count, Absolute: 55.6 K/uL (ref 19.0–186.0)
Retic Ct Pct: 1.2 % (ref 0.4–3.1)
Reticulocyte Hemoglobin: 34.4 pg (ref >27.9)

## 2024-02-24 LAB — FERRITIN: Ferritin: 255 ng/mL (ref 11–307)

## 2024-02-24 MED ORDER — ANASTROZOLE 1 MG PO TABS: 1.0000 mg | ORAL_TABLET | Freq: Every day | ORAL | 1 refills | Status: AC

## 2024-02-24 NOTE — Assessment & Plan Note (Signed)
 Provoked by OCP use.  Currently not on anticoagulation.

## 2024-02-24 NOTE — Assessment & Plan Note (Signed)
 Lab Results  Component Value Date   HGB 12.7 02/24/2024   TIBC 316 02/24/2024   IRONPCTSAT 18 02/24/2024   FERRITIN 255 02/24/2024    Hemoglobin has improved.  Continue Vitron C 1 tab daily.

## 2024-02-24 NOTE — Assessment & Plan Note (Signed)
Genetic testing negative.  

## 2024-02-24 NOTE — Assessment & Plan Note (Signed)
 Encourage oral hydration.  Repeat calcium  and check PTH in 2-3 weeks.

## 2024-02-24 NOTE — Progress Notes (Signed)
 Patient reports discomfort at the scar tissue in area of left breast. She would like to meet back with [place] in rehab screen for reevaluation and recommendations.

## 2024-02-24 NOTE — Assessment & Plan Note (Signed)
 Dec 2024 DEXA osteopenia. On calcium  and vitamin D  supplementation.  Ca increased today. Repeat levels in 2-3 weeks.

## 2024-02-24 NOTE — Progress Notes (Signed)
 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 Status post left breast lumpectomy and bilateral breast reduction. Oncotype DX sent on largest breast mass came back at 9. no benefit for adjuvant chemotherapy. S/p adjuvant radiation followed  recommend adjuvant endocrine therapy with aromatase inhibitor. Continue Arimidex  1mg  daily. She tolerates the treatment.       Family history of cancer Genetic testing negative.   History of pulmonary embolism Provoked by OCP use.  Currently not on anticoagulation.  Iron  deficiency anemia Lab Results  Component Value Date   HGB 12.7 02/24/2024   TIBC 316 02/24/2024   IRONPCTSAT 18 02/24/2024   FERRITIN 255 02/24/2024    Hemoglobin has improved.  Continue Vitron C 1 tab daily.   Hypercalcemia Encourage oral hydration.  Repeat calcium  and check PTH in 2-3 weeks.   Osteopenia Dec 2024 DEXA osteopenia. On calcium  and vitamin D  supplementation.  Ca increased today. Repeat levels in 2-3 weeks.     Orders Placed This Encounter  Procedures   Parathyroid hormone,  intact and calcium     Standing Status:   Future    Expected Date:   02/24/2024    Expiration Date:    05/24/2024   VITAMIN D  25 Hydroxy (Vit-D Deficiency, Fractures)    Standing Status:   Future    Expected Date:   02/24/2024    Expiration Date:   05/24/2024   CMP (Cancer Center only)    Standing Status:   Future    Expected Date:   08/24/2024    Expiration Date:   11/22/2024   CBC with Differential (Cancer Center Only)    Standing Status:   Future    Expected Date:   08/24/2024    Expiration Date:   11/22/2024    Follow-up in 6 months All questions were answered. The patient knows to call the clinic with any problems, questions or concerns.  Call Babara, MD, PhD [facility] Hematology Oncology 02/24/2024    HISTORY OF PRESENTING ILLNESS:  Paige Hester 68 y.o. female presents to establish care for left breast cancer I have reviewed her chart and materials related to her cancer extensively and collaborated history with the patient. Summary of oncologic history is as follows: Oncology History  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)  02/26/2023 Mammogram   Bilateral screening mammogram  In the left breast, possible distortion warrants further evaluation. In the right breast, no findings suspicious for malignancy.     03/04/2023 Mammogram   Bilateral diagnostic mammogram showed  1. Left breast 7 mm irregular hypoechoic mass in the 12 o'clock position is suspicious for malignancy. Recommend further assessment with ultrasound-guided biopsy. 2. Additional focal asymmetry with subtle distortion in the upper inner  left breast posterior depth, located approximately 5.0 cm posterior to the mass, is also suspicious for malignancy. Recommend further assessment with stereotactic guided biopsy. 3. Incidentally visualized benign cyst in the left breast 11 o'clock position.   RECOMMENDATION: 1. Left breast ultrasound-guided biopsy (1 site). 2. Left breast stereotactic guided biopsy (1 site).   03/17/2023 Initial Diagnosis   Invasive carcinoma of breast  [facility])  Patient underwent left breast biopsy 1. Breast, left, needle core biopsy, 12 o'clock, 7cmfn (ribbon clip) :      - INVASIVE DUCTAL CARCINOMA, SEE NOTE      - TUBULE FORMATION: SCORE 2      - NUCLEAR PLEOMORPHISM: SCORE 2      - MITOTIC COUNT: SCORE 1      - TOTAL SCORE: 5      - OVERALL GRADE: 1      - LYMPHOVASCULAR INVASION: NOT IDENTIFIED      - CANCER LENGTH: 5.5 MM      - CALCIFICATIONS: PRESENT       2. Breast, left, needle core biopsy, 11 o'clock, 15cmfn (coil clip) :      - INVASIVE DUCTAL CARCINOMA, SEE NOTE      - TUBULE FORMATION: SCORE 2      - NUCLEAR PLEOMORPHISM: SCORE 2      - MITOTIC COUNT: SCORE 1      - TOTAL SCORE: 5      - OVERALL GRADE: 1      - LYMPHOVASCULAR INVASION: NOT IDENTIFIED      - CANCER LENGTH: 4 MM      - CALCIFICATIONS: NOT IDENTIFIED       Diagnosis Note : - 2.Dr.Patrick reviewed the case and concurs with the      interpretations.Immunohistochemical staining for myoepithelial markers      (calponin, smooth muscle myosin and p63) is performed on blocks 1 and 2.The      myoepithelial markers are focally lost which is consistent with the above      diagnoses.       Block 1 ER 99% positive, PR 40% positive, HER2 IHC 2+, FISH negative.        Block 2 ER 90% positive, PR 70% positive, HER2 IHC 2+, FISH negative [result in image view]     03/26/2023 Cancer Staging   Staging form: Breast, AJCC 8th Edition - Clinical stage from 03/26/2023: Stage IA (cT1b, cN0, cM0, G1, ER+, PR+, HER2-) - Signed by Babara Call, MD on 03/26/2023 Stage prefix: Initial diagnosis Histologic grading system: 3 grade system   03/30/2023 Procedure   MRI guided core needle biopsy of the left breast showed1. Breast, left, needle core biopsy, superior central (barbell clip) :      INVASIVE MAMMARY CARCINOMA, SEE NOTE      TUBULE FORMATION: SCORE 3      NUCLEAR PLEOMORPHISM: SCORE 2      MITOTIC COUNT: SCORE 1      TOTAL SCORE: 6      OVERALL GRADE: 2      LYMPHOVASCULAR  INVASION: NOT IDENTIFIED      CANCER LENGTH: 0.6 CM      CALCIFICATIONS: NOT IDENTIFIED      OTHER FINDINGS: NONE      ER 100%, PR 2% HER2 1+ negative.        2. Breast, left, needle core biopsy, superior anterior (cylinder clip) :      INVASIVE MAMMARY CARCINOMA, SEE NOTE      TUBULE FORMATION: SCORE 3  NUCLEAR PLEOMORPHISM: SCORE 2      MITOTIC COUNT: SCORE 1      TOTAL SCORE: 6      OVERALL GRADE: 2      LYMPHOVASCULAR INVASION: NOT IDENTIFIED      CANCER LENGTH: 0.4      CALCIFICATIONS: NOT IDENTIFIED      OTHER FINDINGS: NONE       ER 95%, PR 30% HER2 1+ negative.     04/03/2023 Genetic Testing   Negative genetic testing on the BRCA STAT Panel through [facility]. The Common Hereditary Cancer + RNA panel was also negative. The report dates are 04/03/2023 and April 08, 2023  The STAT Breast cancer panel offered by [facility] includes sequencing and rearrangement analysis for the following 9 genes:  ATM, BRCA1, BRCA2, CDH1, CHEK2, PALB2, PTEN, STK11 and TP53.   The Common Hereditary Gene Panel offered by [facility] includes sequencing and/or deletion duplication testing of the following 48 genes: APC, ATM, AXIN2, BAP1, BARD1, BMPR1A, BRCA1, BRCA2, BRIP1, CDH1, CDK4, CDKN2A (p14ARF), CDKN2A (p16INK4a), CHEK2, CTNNA1, DICER1, EPCAM (Deletion/duplication testing only), GREM1 (promoter region deletion/duplication testing only), KIT, MEN1, MLH1, MSH2, MSH3, MSH6, MUTYH, NBN, NF1, NHTL1, PALB2, PDGFRA, PMS2, POLD1, POLE, PTEN, RAD50, RAD51C, RAD51D, SDHB, SDHC, SDHD, SMAD4, SMARCA4. STK11, TP53, TSC1, TSC2, and VHL.  The following genes were evaluated for sequence changes only: SDHA and HOXB13 c.251G>A variant only.     04/09/2023 Imaging   MRI breast with and without contrast showed 1. Two sites of biopsy proven malignancy in the left breast, with the mass at the 12 o'clock position measuring 1.2 x 0.9 x 0.7 cm and the mass/enhancement at the 11 o'clock position posterior depth measuring up to  1.5 cm.   2. Additional 1.4 cm suspicious mass in the upper central left breast (image 79) located inferior to the biopsy proven malignancy at the 12 o'clock position.   3. Two 0.4 cm indeterminate masses in the upper central left breast anterior to mid depth (image 84).   05/21/2023 Surgery   Patient underwent left lumpectomy and sentinel lymph node biopsy.   05/21/2023 Surgery   Patient went to [facility] for second opinion for breast surgery and the preferred breast preserving surgery.  Patient underwent left lumpectomy and sentinel lymph node biopsy on 05/21/2023. Tumor 1: 6 mm Grade 1 ER+ (>75%, strong), PR+ (>75%, strong), HER2 negative (IHC 1+) invasive ductal carcinoma. Ki-67 5% Tumor 2: 8 mm Grade 1 ER+ (>75%, strong), PR+ (>75%, strong), HER2 negative (IHC 2+ and FISH negative) invasive ductal carcinoma. Ki-6 5-10% Tumor 3: 18 mm Grade 2 ER+ (>75%, strong), PR+ (1-25%, weak) HER2 negative (IHC 1+) invasive lobular carcinoma. Ki-67 5-10% Tumor 4: 2.2 mm Grade 1 ER+ (>75%, strong), PR+ (>75%, strong) HER2 negative (IHC 2+ and FISH negative) invasive ductal carcinoma. Ki-67 5-10%  Associated Grade 2 DCIS. All true margins negative, No LVI, Single SLN involved, contains isolated tumor cells (0.16 mm). No extra nodal extention.   OncotypeDX has been sent on the 18 mm tumor and is 9   06/26/2023 Cancer Staging   Staging form: Breast, AJCC 8th Edition - Pathologic stage from 06/26/2023: Stage IA (pT1c, pN0(i+)(sn), cM0, G2, ER+, PR+, HER2-, Oncotype DX score: 9) - Signed by Babara Call, MD on 07/01/2023 Stage prefix: Initial diagnosis Method of lymph node assessment: Sentinel lymph node biopsy Multigene prognostic tests performed: Oncotype DX Recurrence score range: Less than 11 Histologic grading system: 3 grade system   07/08/2023 - 08/28/2023 Radiation Therapy  S/p adjuvant radiation   09/10/2023 -  Anti-estrogen oral therapy   Start on Arimidex  1mg  daily     She  tolerates Arimidex  1mg  daily. Manageable hot flash reports discomfort at the scar tissue in area of left breast. She would like to meet back with PT clinic.     MEDICAL HISTORY:  Past Medical History:  Diagnosis Date   Allergy    Bilateral swelling of feet and ankles    Family history of colon cancer    Family history of prostate cancer    Gallbladder polyp 2012   GERD (gastroesophageal reflux disease)    Hyperlipidemia    Hypertension    Iron  deficiency anemia    Menopausal symptoms    since 2007   Murmur, cardiac    Other fatigue    Prediabetes    Pulmonary embolism (HCC) 06/2020   Shortness of breath    with exertion; patient going to pulmonary rehabilitation   Shortness of breath on exertion    Sleep apnea    Uses CPAP   Vitamin D  deficiency     SURGICAL HISTORY: Past Surgical History:  Procedure Laterality Date   BREAST BIOPSY Left 03/17/2023   US  bx,Ribbon Clip, path pending   BREAST BIOPSY Left 03/17/2023   US  Bx, Coil Clip,path pending   BREAST BIOPSY Left 03/17/2023   US  LT BREAST BX W LOC DEV 1ST LESION IMG BX SPEC US  GUIDE 03/17/2023 [facility]   BREAST BIOPSY Left 03/17/2023   US  LT BREAST BX W LOC DEV EA ADD LESION IMG BX SPEC US  GUIDE 03/17/2023 [facility]   COLONOSCOPY  2010   2020   HYSTEROSCOPY WITH D & C N/A 07/27/2019   Procedure: DILATATION AND CURETTAGE /HYSTEROSCOPY, Polypectomy;  Surgeon: Herchel Gloris LABOR, MD;  Location: [facility];  Service: Gynecology;  Laterality: N/A;   HYSTEROSCOPY WITH D & C N/A 06/26/2021   Procedure: DILATATION AND CURETTAGE /HYSTEROSCOPY;  Surgeon: Herchel Gloris LABOR, MD;  Location: [facility];  Service: Gynecology;  Laterality: N/A;   INTRAUTERINE DEVICE (IUD) INSERTION N/A 06/26/2021   Procedure: INTRAUTERINE DEVICE (IUD) INSERTION;  Surgeon: Herchel Gloris LABOR, MD;  Location: [facility];  Service: Gynecology;  Laterality: N/A;    SOCIAL HISTORY: Social  History   Socioeconomic History   Marital status: Legally Separated    Spouse name: Not on file   Number of children: 1   Years of education: Not on file   Highest education level: Bachelor's degree (e.g., BA, AB, BS)  Occupational History   Occupation: Retired  Tobacco Use   Smoking status: Former    Current packs/day: 0.00    Average packs/day: 1 pack/day for 27.0 years (27.0 ttl pk-yrs)    Types: Cigarettes    Start date: 73    Quit date: 1999    Years since quitting: 26.9   Smokeless tobacco: Never  Vaping Use   Vaping status: Never Used  Substance and Sexual Activity   Alcohol use: Yes    Alcohol/week: 7.0 standard drinks of alcohol    Types: 7 Glasses of wine per week    Comment: 1-2 glasses of wine per night   Drug use: No   Sexual activity: Yes  Other Topics Concern   Not on file  Social History Narrative   Married.   1 child, 1 grandchild.   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   Enjoys going to [employer], singing.  Social Drivers of Corporate Investment Banker Strain: Low Risk (11/17/2023)   Received from [facility]   Overall Financial Resource Strain (CARDIA)    Difficulty of Paying Living Expenses: Not hard at all  Recent Concern: Financial Resource Strain - Medium Risk (10/03/2023)   Overall Financial Resource Strain (CARDIA)    Difficulty of Paying Living Expenses: Somewhat hard  Food Insecurity: No Food Insecurity (11/17/2023)   Received from [facility]   Hunger Vital Sign    Within the past 12 months, you worried that your food would run out before you got the money to buy more.: Never true    Within the past 12 months, the food you bought just didn't last and you didn't have money to get more.: Never true  Recent Concern: Food Insecurity - Food Insecurity Present (10/03/2023)   Hunger Vital Sign    Worried About Running Out of Food in the Last Year: Sometimes true    Ran Out of Food in the Last Year:  Sometimes true  Transportation Needs: No Transportation Needs (11/17/2023)   Received from [facility] - Transportation    In the past 12 months, has lack of transportation kept you from medical appointments or from getting medications?: No  Recent Concern: Transportation Needs - Unmet Transportation Needs (10/03/2023)   PRAPARE - Transportation    Lack of Transportation (Medical): No    Lack of Transportation (Non-Medical): Yes  Physical Activity: Inactive (10/03/2023)   Exercise Vital Sign    Days of Exercise per Week: 0 days    Minutes of Exercise per Session: Not on file  Stress: No Stress Concern Present (10/03/2023)   [employer] of Occupational Health - Occupational Stress Questionnaire    Feeling of Stress: Not at all  Social Connections: Moderately Integrated (10/03/2023)   Social Connection and Isolation Panel    Frequency of Communication with Friends and Family: More than three times a week    Frequency of Social Gatherings with Friends and Family: Three times a week    Attends Religious Services: More than 4 times per year    Active Member of Clubs or Organizations: Yes    Attends Banker Meetings: More than 4 times per year    Marital Status: Separated  Intimate Partner Violence: Not At Risk (11/17/2023)   Received from [facility]   Humiliation, Afraid, Rape, and Kick questionnaire    Within the last year, have you been afraid of your partner or ex-partner?: No    FAMILY HISTORY: Family History  Problem Relation Age of Onset   Depression Mother    Stroke Mother    Sudden death Mother    Heart disease Mother    Hypertension Mother    Heart attack Mother 58   Cancer Father    Prostate cancer Father        metastatic   Heart disease Father 32       CAD, STENT   Coronary artery disease Sister        stent x 2   Coronary artery disease Sister 59       stent placed   Hypertension Sister    Hypertension Sister    Heart disease  Brother        Myocardial infarction   Colon cancer Maternal Aunt    Sudden death Other    Depression Other    Colon polyps Neg Hx    Esophageal cancer Neg Hx    Rectal  cancer Neg Hx    Stomach cancer Neg Hx    Breast cancer Neg Hx     ALLERGIES:  has no known allergies.  MEDICATIONS:  Current Outpatient Medications  Medication Sig Dispense Refill   albuterol  (VENTOLIN  HFA) 108 (90 Base) MCG/ACT inhaler Inhale 2 puffs into the lungs every 6 (six) hours as needed for wheezing or shortness of breath. 8 g 2   aspirin  EC 81 MG tablet Take 81 mg by mouth daily. Swallow whole.     carvedilol (COREG) 3.125 MG tablet Take 3.125 mg by mouth 2 (two) times daily with a meal.     Cholecalciferol (VITAMIN D3) 1000 UNITS tablet Take 1,000 Units by mouth daily.     cyclobenzaprine (FLEXERIL) 10 MG tablet Take 10 mg by mouth 3 (three) times daily as needed.     fluticasone  (FLONASE ) 50 MCG/ACT nasal spray Place 1 spray into both nostrils 2 (two) times daily as needed for allergies or rhinitis. 16 g 0   furosemide (LASIX) 20 MG tablet Take 20 mg by mouth daily.     Iron -Vitamin C  65-125 MG TABS Take 1 tablet by mouth daily. 30 tablet 2   loratadine (CLARITIN) 10 MG tablet Take 10 mg by mouth daily.     Melatonin 10 MG TABS Take by mouth as needed.     Naproxen DR 500 MG TBEC Take 1 tablet by mouth 2 (two) times daily.     omeprazole  (PRILOSEC) 20 MG capsule Take 1 capsule (20 mg total) by mouth daily. for heartburn. 14 capsule 0   Oyster Shell (OYSTER CALCIUM ) 500 MG TABS tablet Take 600 mg of elemental calcium  by mouth 2 (two) times daily.     Potassium Bicarbonate  99 MG CAPS Take 1 capsule (99 mg total) by mouth in the morning and at bedtime. 180 capsule 1   rosuvastatin  (CRESTOR ) 20 MG tablet Take 1 tablet (20 mg total) by mouth daily. 90 tablet 0   spironolactone (ALDACTONE) 25 MG tablet Take 25 mg by mouth daily.     anastrozole  (ARIMIDEX ) 1 MG tablet Take 1 tablet (1 mg total) by mouth daily.  90 tablet 1   No current facility-administered medications for this visit.    Review of Systems  Constitutional:  Negative for appetite change, chills, fatigue and fever.  HENT:   Negative for hearing loss and voice change.   Eyes:  Negative for eye problems.  Respiratory:  Negative for chest tightness and cough.   Cardiovascular:  Negative for chest pain.  Gastrointestinal:  Negative for abdominal distention, abdominal pain and blood in stool.  Endocrine: Negative for hot flashes.  Genitourinary:  Negative for difficulty urinating and frequency.   Musculoskeletal:  Negative for arthralgias.  Skin:  Negative for itching and rash.  Neurological:  Negative for extremity weakness.  Hematological:  Negative for adenopathy.  Psychiatric/Behavioral:  Negative for confusion.      PHYSICAL EXAMINATION: Vitals:   02/24/24 1100 02/24/24 1105  BP: (!) 160/65 (!) 156/63  Pulse: 61 63  Resp: 18   Temp: 98 F (36.7 C)   SpO2: 100%    Filed Weights   02/24/24 1100  Weight: 249 lb (112.9 kg)    Physical Exam Constitutional:      General: She is not in acute distress.    Appearance: She is obese. She is not diaphoretic.  HENT:     Head: Normocephalic and atraumatic.  Eyes:     General: No scleral icterus. Cardiovascular:  Rate and Rhythm: Normal rate and regular rhythm.     Heart sounds: Murmur heard.  Pulmonary:     Effort: Pulmonary effort is normal. No respiratory distress.  Abdominal:     General: Bowel sounds are normal. There is no distension.     Palpations: Abdomen is soft.  Musculoskeletal:        General: Normal range of motion.     Cervical back: Normal range of motion and neck supple.  Skin:    Findings: No erythema.  Neurological:     Mental Status: She is alert and oriented to person, place, and time. Mental status is at baseline.     Motor: No abnormal muscle tone.  Psychiatric:        Mood and Affect: Mood and affect normal.     LABORATORY DATA:  I  have reviewed the data as listed    Latest Ref Rng & Units 02/24/2024   10:38 AM 09/10/2023    2:10 PM 05/11/2023    3:27 PM  CBC  WBC 4.0 - 10.5 K/uL 6.0  4.1  4.7   Hemoglobin 12.0 - 15.0 g/dL 87.2  88.1  88.2   Hematocrit 36.0 - 46.0 % 38.9  36.2  36.6   Platelets 150 - 400 K/uL 278  222  331.0       Latest Ref Rng & Units 02/24/2024   10:39 AM 09/10/2023    2:10 PM 05/11/2023    3:27 PM  CMP  Glucose 70 - 99 mg/dL 98  892  93   BUN 8 - 23 mg/dL 17  13  10    Creatinine 0.44 - 1.00 mg/dL 9.02  9.16  9.24   Sodium 135 - 145 mmol/L 141  138  143   Potassium 3.5 - 5.1 mmol/L 4.1  3.3  3.8   Chloride 98 - 111 mmol/L 104  102  102   CO2 22 - 32 mmol/L 26  28  32   Calcium  8.9 - 10.3 mg/dL 89.2  9.8  89.6   Total Protein 6.5 - 8.1 g/dL 7.2  7.2    Total Bilirubin 0.0 - 1.2 mg/dL 0.7  0.6    Alkaline Phos 38 - 126 U/L 76  59    AST 15 - 41 U/L 21  19    ALT 0 - 44 U/L 11  13       RADIOGRAPHIC STUDIES: I have personally reviewed the radiological images as listed and agreed with the findings in the report. No results found.

## 2024-02-24 NOTE — Assessment & Plan Note (Addendum)
 Left multifocal stage I pT1c (m) pN0 (i+)  ER/PR positive HER2 negative invasive ductal carcinoma. Status post left breast lumpectomy and bilateral breast reduction. Oncotype DX sent on largest breast mass came back at 9. no benefit for adjuvant chemotherapy. S/p adjuvant radiation followed  recommend adjuvant endocrine therapy with aromatase inhibitor. Continue Arimidex  1mg  daily. She tolerates the treatment.

## 2024-03-03 ENCOUNTER — Ambulatory Visit: Payer: Self-pay | Admitting: Primary Care

## 2024-03-03 ENCOUNTER — Ambulatory Visit: Admitting: Primary Care

## 2024-03-03 ENCOUNTER — Encounter: Payer: Self-pay | Admitting: Primary Care

## 2024-03-03 ENCOUNTER — Ambulatory Visit: Admitting: *Deleted

## 2024-03-03 VITALS — BP 136/68 | HR 71 | Temp 97.9°F | Ht 65.0 in | Wt 247.2 lb

## 2024-03-03 DIAGNOSIS — M545 Low back pain, unspecified: Secondary | ICD-10-CM | POA: Insufficient documentation

## 2024-03-03 DIAGNOSIS — G8929 Other chronic pain: Secondary | ICD-10-CM | POA: Diagnosis not present

## 2024-03-03 DIAGNOSIS — C50919 Malignant neoplasm of unspecified site of unspecified female breast: Secondary | ICD-10-CM

## 2024-03-03 DIAGNOSIS — Z9189 Other specified personal risk factors, not elsewhere classified: Secondary | ICD-10-CM

## 2024-03-03 LAB — POC URINALSYSI DIPSTICK (AUTOMATED)
Bilirubin, UA: NEGATIVE
Blood, UA: NEGATIVE
Glucose, UA: NEGATIVE
Ketones, UA: NEGATIVE
Leukocytes, UA: NEGATIVE
Nitrite, UA: NEGATIVE
Protein, UA: NEGATIVE
Spec Grav, UA: 1.01 (ref 1.010–1.025)
Urobilinogen, UA: 0.2 U/dL
pH, UA: 6 (ref 5.0–8.0)

## 2024-03-03 MED ORDER — CYCLOBENZAPRINE HCL 10 MG PO TABS: 10.0000 mg | ORAL_TABLET | Freq: Every day | ORAL | 0 refills | Status: AC

## 2024-03-03 MED ORDER — NAPROXEN DR 500 MG PO TBEC: 1.0000 | DELAYED_RELEASE_TABLET | Freq: Two times a day (BID) | ORAL | 0 refills | Status: DC | PRN

## 2024-03-03 NOTE — Progress Notes (Signed)
 Subjective:    Patient ID: Paige Hester, female    DOB: March 18, 1955, 68 y.o.   MRN: [id number]  Paige Hester is a very pleasant 68 y.o. female with a history of hypertension, aortic atherosclerosis, OSA, carpal tunnel syndrome, osteopenia, PE, prediabetes, hyperlipidemia, acute back pain who presents today to discuss back pain.  Symptom on set 6 months ago after a fall. Evaluated in July 2025 for the same symptoms after a fall 2 weeks prior. She was walking to open up her screen front door, saw a wasp, fell onto her left side. She underwent plain films of the lumbar spine which showed:  Degenerative changes with marginal osteophyte formation L2 through L5. Grade 1 L5 retrolisthesis. No compression deformities. No osteolytic or osteoblastic changes. Aortoiliac atheromatous calcifications.  Today she discusses that her lower back pain has moved bilaterally and is intermittent. Standing and sitting makes her pain worse, laying helps. She underwent a MRI in November 2025 in [place], [place]. She does not have the report today, only a disc, but she believes she has bulging discs to the L3-4 or L4-5.   She denies weakness, numbness/tingling down legs. She has been taking naproxen  500 mg BID and cyclobenzaprine  which helped but she is out. She has not completed physical therapy  BP Readings from Last 3 Encounters:  03/03/24 136/68  02/24/24 (!) 156/63  10/09/23 116/68     Review of Systems  Respiratory:  Negative for shortness of breath.   Cardiovascular:  Negative for chest pain.  Musculoskeletal:  Positive for arthralgias and back pain.  Neurological:  Negative for weakness and numbness.         Past Medical History:  Diagnosis Date   Allergy    Bilateral swelling of feet and ankles    Family history of colon cancer    Family history of prostate cancer    Gallbladder polyp 2012   GERD (gastroesophageal reflux disease)    Hyperlipidemia    Hypertension    Iron   deficiency anemia    Menopausal symptoms    since 2007   Murmur, cardiac    Other fatigue    Prediabetes    Pulmonary embolism (HCC) 06/2020   Shortness of breath    with exertion; patient going to pulmonary rehabilitation   Shortness of breath on exertion    Sleep apnea    Uses CPAP   Vitamin D  deficiency     Social History   Socioeconomic History   Marital status: Legally Separated    Spouse name: Not on file   Number of children: 1   Years of education: Not on file   Highest education level: Bachelor's degree (e.g., BA, AB, BS)  Occupational History   Occupation: Retired  Tobacco Use   Smoking status: Former    Current packs/day: 0.00    Average packs/day: 1 pack/day for 27.0 years (27.0 ttl pk-yrs)    Types: Cigarettes    Start date: 59    Quit date: 1999    Years since quitting: 26.9   Smokeless tobacco: Never  Vaping Use   Vaping status: Never Used  Substance and Sexual Activity   Alcohol use: Yes    Alcohol/week: 7.0 standard drinks of alcohol    Types: 7 Glasses of wine per week    Comment: 1-2 glasses of wine per night   Drug use: No   Sexual activity: Yes  Other Topics Concern   Not on file  Social History Narrative  Married.   1 child, 1 grandchild.   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   Enjoys going to [employer], singing.    Social Drivers of Health   Tobacco Use: Medium Risk (03/03/2024)   Patient History    Smoking Tobacco Use: Former    Smokeless Tobacco Use: Never    Passive Exposure: Not on file  Financial Resource Strain: Low Risk (11/17/2023)   Received from [facility]   Overall Financial Resource Strain (CARDIA)    Difficulty of Paying Living Expenses: Not hard at all  Recent Concern: Financial Resource Strain - Medium Risk (10/03/2023)   Overall Financial Resource Strain (CARDIA)    Difficulty of Paying Living Expenses: Somewhat hard  Food Insecurity: No Food Insecurity (11/17/2023)    Received from [facility]   Epic    Within the past 12 months, you worried that your food would run out before you got the money to buy more.: Never true    Within the past 12 months, the food you bought just didn't last and you didn't have money to get more.: Never true  Recent Concern: Food Insecurity - Food Insecurity Present (10/03/2023)   Epic    Worried About Programme Researcher, Broadcasting/film/video in the Last Year: Sometimes true    Ran Out of Food in the Last Year: Sometimes true  Transportation Needs: No Transportation Needs (11/17/2023)   Received from [facility] - Transportation    In the past 12 months, has lack of transportation kept you from medical appointments or from getting medications?: No  Recent Concern: Transportation Needs - Unmet Transportation Needs (10/03/2023)   Epic    Lack of Transportation (Medical): No    Lack of Transportation (Non-Medical): Yes  Physical Activity: Inactive (10/03/2023)   Exercise Vital Sign    Days of Exercise per Week: 0 days    Minutes of Exercise per Session: Not on file  Stress: No Stress Concern Present (10/03/2023)   [employer] of [employer]ational [employer] - Occupational Stress Questionnaire    Feeling of Stress: Not at all  Social Connections: Moderately Integrated (10/03/2023)   Social Connection and Isolation Panel    Frequency of Communication with Friends and Family: More than three times a week    Frequency of Social Gatherings with Friends and Family: Three times a week    Attends Religious Services: More than 4 times per year    Active Member of Clubs or Organizations: Yes    Attends Banker Meetings: More than 4 times per year    Marital Status: Separated  Intimate Partner Violence: Not At Risk (11/17/2023)   Received from [facility]   Humiliation, Afraid, Rape, and Kick questionnaire    Within the last year, have you been afraid of your partner or ex-partner?: No  Depression (PHQ2-9): Low Risk (03/03/2024)    Depression (PHQ2-9)    PHQ-2 Score: 2  Alcohol Screen: Low Risk (10/03/2023)   Alcohol Screen    Last Alcohol Screening Score (AUDIT): 3  Housing: High Risk (10/03/2023)   Epic    Unable to Pay for Housing in the Last Year: Yes    Number of Times Moved in the Last Year: 0    Homeless in the Last Year: No  Utilities: At Risk (04/14/2023)   [facility] Utilities    Threatened with loss of utilities: Yes  Health Literacy: Adequate Health Literacy (03/16/2023)   B1300 Health Literacy    Frequency  of need for help with medical instructions: Never    Past Surgical History:  Procedure Laterality Date   BREAST BIOPSY Left 03/17/2023   US  bx,Ribbon Clip, path pending   BREAST BIOPSY Left 03/17/2023   US  Bx, Coil Clip,path pending   BREAST BIOPSY Left 03/17/2023   US  LT BREAST BX W LOC DEV 1ST LESION IMG BX SPEC US  GUIDE 03/17/2023 [facility]   BREAST BIOPSY Left 03/17/2023   US  LT BREAST BX W LOC DEV EA ADD LESION IMG BX SPEC US  GUIDE 03/17/2023 [facility]   COLONOSCOPY  2010   2020   HYSTEROSCOPY WITH D & C N/A 07/27/2019   Procedure: DILATATION AND CURETTAGE /HYSTEROSCOPY, Polypectomy;  Surgeon: Herchel Gloris LABOR, MD;  Location: [facility];  Service: Gynecology;  Laterality: N/A;   HYSTEROSCOPY WITH D & C N/A 06/26/2021   Procedure: DILATATION AND CURETTAGE /HYSTEROSCOPY;  Surgeon: Herchel Gloris LABOR, MD;  Location: [facility];  Service: Gynecology;  Laterality: N/A;   INTRAUTERINE DEVICE (IUD) INSERTION N/A 06/26/2021   Procedure: INTRAUTERINE DEVICE (IUD) INSERTION;  Surgeon: Herchel Gloris LABOR, MD;  Location: [facility];  Service: Gynecology;  Laterality: N/A;    Family History  Problem Relation Age of Onset   Depression Mother    Stroke Mother    Sudden death Mother    Heart disease Mother    Hypertension Mother    Heart attack Mother 48   Cancer Father    Prostate cancer Father        metastatic   Heart disease Father 74        CAD, STENT   Coronary artery disease Sister        stent x 2   Coronary artery disease Sister 20       stent placed   Hypertension Sister    Hypertension Sister    Heart disease Brother        Myocardial infarction   Colon cancer Maternal Aunt    Sudden death Other    Depression Other    Colon polyps Neg Hx    Esophageal cancer Neg Hx    Rectal cancer Neg Hx    Stomach cancer Neg Hx    Breast cancer Neg Hx     Allergies[1]  Medications Ordered Prior to Encounter[2]  BP 136/68   Pulse 71   Temp 97.9 F (36.6 C) (Oral)   Ht 5' 5 (1.651 m)   Wt 247 lb 4 oz (112.2 kg)   SpO2 97%   BMI 41.14 kg/m  Objective:   Physical Exam Cardiovascular:     Rate and Rhythm: Normal rate and regular rhythm.  Pulmonary:     Effort: Pulmonary effort is normal.  Musculoskeletal:     Cervical back: Neck supple.     Lumbar back: No tenderness. Decreased range of motion. Negative right straight leg raise test and negative left straight leg raise test.       Back:  Skin:    General: Skin is warm and dry.  Neurological:     Mental Status: She is alert and oriented to person, place, and time.  Psychiatric:        Mood and Affect: Mood normal.     Physical Exam        Assessment & Plan:  Chronic bilateral low back pain, unspecified whether sciatica present Assessment & Plan: Uncontrolled.  Unfortunately, we do not have the ability to read a disc to interpret her MRI results.  Referral placed to orthopedics for further evaluation per her request.  We discussed that she should bring the disc to their office.  Resume cyclobenzaprine  and naproxen  as these were effective. Start naproxen  500 mg twice daily and cyclobenzaprine  10 mg at bedtime.  We also discussed the risks of long-term NSAID use and to take NSAIDs with food.  UA negative.     Orders: -     POCT Urinalysis Dipstick (Automated) -     Cyclobenzaprine  HCl; Take 1 tablet (10 mg total) by mouth at bedtime. For  back pain  Dispense: 90 tablet; Refill: 0 -     Naproxen  DR; Take 1 tablet (500 mg total) by mouth 2 (two) times daily as needed (for pain).  Dispense: 180 tablet; Refill: 0 -     Ambulatory referral to Orthopedic Surgery    Assessment and Plan Assessment & Plan         Comer MARLA Gaskins, NP       [1] No Known Allergies [2]  Current Outpatient Medications on File Prior to Visit  Medication Sig Dispense Refill   albuterol  (VENTOLIN  HFA) 108 (90 Base) MCG/ACT inhaler Inhale 2 puffs into the lungs every 6 (six) hours as needed for wheezing or shortness of breath. 8 g 2   anastrozole  (ARIMIDEX ) 1 MG tablet Take 1 tablet (1 mg total) by mouth daily. 90 tablet 1   aspirin  EC 81 MG tablet Take 81 mg by mouth daily. Swallow whole.     carvedilol (COREG) 3.125 MG tablet Take 3.125 mg by mouth 2 (two) times daily with a meal.     Cholecalciferol (VITAMIN D3) 1000 UNITS tablet Take 1,000 Units by mouth daily.     fluticasone  (FLONASE ) 50 MCG/ACT nasal spray Place 1 spray into both nostrils 2 (two) times daily as needed for allergies or rhinitis. 16 g 0   furosemide (LASIX) 20 MG tablet Take 20 mg by mouth daily.     Iron -Vitamin C  65-125 MG TABS Take 1 tablet by mouth daily. 30 tablet 2   loratadine (CLARITIN) 10 MG tablet Take 10 mg by mouth daily.     Melatonin 10 MG TABS Take by mouth as needed.     omeprazole  (PRILOSEC) 20 MG capsule Take 1 capsule (20 mg total) by mouth daily. for heartburn. 14 capsule 0   Oyster Shell (OYSTER CALCIUM ) 500 MG TABS tablet Take 600 mg of elemental calcium  by mouth 2 (two) times daily.     Potassium Bicarbonate  99 MG CAPS Take 1 capsule (99 mg total) by mouth in the morning and at bedtime. 180 capsule 1   rosuvastatin  (CRESTOR ) 20 MG tablet Take 1 tablet (20 mg total) by mouth daily. 90 tablet 0   spironolactone (ALDACTONE) 25 MG tablet Take 25 mg by mouth daily.     No current facility-administered medications on file prior to visit.

## 2024-03-03 NOTE — Assessment & Plan Note (Addendum)
 Uncontrolled.  Unfortunately, we do not have the ability to read a disc to interpret her MRI results. Referral placed to orthopedics for further evaluation per her request.  We discussed that she should bring the disc to their office.  Resume cyclobenzaprine  and naproxen  as these were effective. Start naproxen  500 mg twice daily and cyclobenzaprine  10 mg at bedtime.  We also discussed the risks of long-term NSAID use and to take NSAIDs with food.  UA negative.

## 2024-03-03 NOTE — Patient Instructions (Signed)
 You will either be contacted via phone regarding your referral to orthopedics, or you may receive a letter on your MyChart portal from our referral team with instructions for scheduling an appointment. Please let us  know if you have not been contacted by anyone within two weeks.  You may take naproxen  500 mg tablets twice daily with food for back pain.  You may take the cyclobenzaprine  muscle relaxer medication at bedtime.  Continue your stretches  It was a pleasure to see you today!

## 2024-03-03 NOTE — Progress Notes (Signed)
 Survivorship Care Plan visit completed.  Treatment summary reviewed and given to patient.  ASCO answers booklet reviewed and given to patient.  CARE program and Cancer Transitions discussed with patient along with other resources cancer center offers to patients and caregivers.  Patient verbalized understanding.

## 2024-03-03 NOTE — Progress Notes (Signed)
 SUBJECTIVE: Pt returns for her 3 month L-Dex screen.    PAIN:  Are you having pain? No   SOZO SCREENING: Patient was assessed today using the SOZO machine to determine the lymphedema index score. This was compared to her baseline score. It was determined that she is within the recommended range when compared to her baseline and no further action is needed at this time. She will continue SOZO screenings. These are done every 3 months for 2 years post operatively followed by every 6 months for 2 years, and then annually.     L-DEX FLOWSHEETS                L-DEX LYMPHEDEMA SCREENING    Measurement Type Unilateral     L-DEX MEASUREMENT EXTREMITY Upper Extremity     POSITION  Standing     DOMINANT SIDE Right     At Risk Side Left     BASELINE SCORE (UNILATERAL) 4.4    L-DEX SCORE (UNILATERAL) 3.1    VALUE CHANGE (UNILAT)

## 2024-03-07 ENCOUNTER — Ambulatory Visit

## 2024-03-07 ENCOUNTER — Emergency Department
Admission: EM | Admit: 2024-03-07 | Discharge: 2024-03-07 | Disposition: A | Discharge status: Home / Self Care | Attending: Emergency Medicine | Admitting: Emergency Medicine

## 2024-03-07 ENCOUNTER — Telehealth: Payer: Self-pay | Admitting: Primary Care

## 2024-03-07 ENCOUNTER — Encounter: Payer: Self-pay | Admitting: Emergency Medicine

## 2024-03-07 ENCOUNTER — Ambulatory Visit: Payer: Self-pay

## 2024-03-07 ENCOUNTER — Other Ambulatory Visit: Payer: Self-pay

## 2024-03-07 VITALS — BP 186/92 | HR 75 | Ht 65.0 in | Wt 246.8 lb

## 2024-03-07 DIAGNOSIS — G8929 Other chronic pain: Secondary | ICD-10-CM | POA: Diagnosis not present

## 2024-03-07 DIAGNOSIS — I10 Essential (primary) hypertension: Secondary | ICD-10-CM | POA: Diagnosis present

## 2024-03-07 DIAGNOSIS — S39012A Strain of muscle, fascia and tendon of lower back, initial encounter: Secondary | ICD-10-CM

## 2024-03-07 NOTE — Telephone Encounter (Signed)
 Copied from CRM #[id number]. Topic: Clinical - Lab/Test Results >> Mar 07, 2024 10:32 AM Antwanette L wrote: Reason for CRM: Patient is unable to view her recent MRI results in MyChart. She is requesting that we fax the results to [facility] at [phone]. Please send to attention at [facility] as soon as possible

## 2024-03-07 NOTE — ED Triage Notes (Signed)
 Pt via POV from doctor's office. Pt here for HTN, pt has a hx and states she was running late so she did not take her BP meds this morning. Officer reports 186/92. Pt c/o mild headache. Denies CP/SOB. Denies dizziness. Pt is A&Ox4 and NAD, ambulatory to triage.

## 2024-03-07 NOTE — Telephone Encounter (Addendum)
 Just now reviewing this message. Will review ED/urgent care notes.

## 2024-03-07 NOTE — Telephone Encounter (Signed)
 FYI Only or Action Required?: FYI only for provider: UC/ED.  Patient was last seen in primary care on 03/03/2024 by Gretta Comer POUR, NP.  Called Nurse Triage reporting Hypertension.  Symptoms began today.  Interventions attempted: Nothing.  Symptoms are: unchanged.  Triage Disposition: See Physician Within 24 Hours  Patient/caregiver understands and will follow disposition?: Yes  Copied from CRM #[id number]. Topic: Clinical - Red Word Triage >> Mar 07, 2024 10:57 AM Laymon HERO wrote: Red Word that prompted transfer to Nurse Triage: Patient leaving [facility] in [place]- blood pressure is reading at 186/92- wanting to know if she should go to ER that is right across the street from where she is at right now.. Reason for Disposition  Systolic BP >= 180 OR Diastolic >= 110  Answer Assessment - Initial Assessment Questions No available appts. Advised UC now and ED/911 if symptoms worsen; patient verbalized understanding. Patient reports will go to ED.  1. BLOOD PRESSURE: What is your blood pressure? Did you take at least two measurements 5 minutes apart?     Last bp 186/92; reports other bp reading was elevated, taken twice. BP readings have been elevated 2. ONSET: When did you take your blood pressure?     today 3. HOW: How did you take your blood pressure? (e.g., automatic home BP monitor, visiting nurse)     Not home, at clinic 4. HISTORY: Do you have a history of high blood pressure?     htn 5. MEDICINES: Are you taking any medicines for blood pressure? Have you missed any doses recently?     last took bp last night, carvedil, spironolactone, furosemide; have not taken any bp meds. 6. OTHER SYMPTOMS: Do you have any symptoms? (e.g., blurred vision, chest pain, difficulty breathing, headache, weakness) Denies diff breathing,chest pain, changes vision, diff speech, weakness/numbness, HA  Protocols used: Blood Pressure - High-A-AH

## 2024-03-07 NOTE — Telephone Encounter (Addendum)
 LM for pt to return my call. [facility] is on the same computer system as us . They should be able to pull up the pt's MRI.  Please provide message from provider/office when call is returned from patient.

## 2024-03-07 NOTE — ED Provider Notes (Signed)
" ° °  [facility] Provider Note    Event Date/Time   First MD Initiated Contact with Patient 03/07/24 1228     (approximate)   History   Hypertension   HPI  Paige Hester is a 68 y.o. female with a history of high blood pressure who presents from the orthopedist office because her blood pressure was elevated there.  It was over 180 systolic.  She is asymptomatic and has no complaints.     Physical Exam   Triage Vital Signs: ED Triage Vitals  Encounter Vitals Group     BP 03/07/24 1210 (!) 158/85     Girls Systolic BP Percentile --      Girls Diastolic BP Percentile --      Boys Systolic BP Percentile --      Boys Diastolic BP Percentile --      Pulse Rate 03/07/24 1210 64     Resp 03/07/24 1210 18     Temp 03/07/24 1210 97.9 F (36.6 C)     Temp Source 03/07/24 1210 Oral     SpO2 03/07/24 1210 97 %     Weight 03/07/24 1209 109.8 kg (242 lb)     Height 03/07/24 1209 1.651 m (5' 5)     Head Circumference --      Peak Flow --      Pain Score 03/07/24 1209 4     Pain Loc --      Pain Education --      Exclude from Growth Chart --     Most recent vital signs: Vitals:   03/07/24 1230 03/07/24 1250  BP:  (!) 154/84  Pulse:  62  Resp:  18  Temp:    SpO2: 100% 100%     General: Awake, no distress.  Pleasant, interactive CV:  Good peripheral perfusion.  Resp:  Normal effort.  Abd:  No distention.  Other:  Normal neurologic exam   ED Results / Procedures / Treatments   Labs (all labs ordered are listed, but only abnormal results are displayed) Labs Reviewed - No data to display   EKG     RADIOLOGY     PROCEDURES:  Critical Care performed:   Procedures   MEDICATIONS ORDERED IN ED: Medications - No data to display   IMPRESSION / MDM / ASSESSMENT AND PLAN / ED COURSE  I reviewed the triage vital signs and the nursing notes. Patient's presentation is most consistent with exacerbation of chronic  illness.  Patient presents with elevated blood pressure as detailed above, she is asymptomatic and quite well-appearing.  Her blood pressure has improved significantly without intervention here.  Reviewed prior blood pressures from the medical records, she is in line with previous levels, no indication for further workup at this time, follow-up with PCP for possible adjustment of blood pressure medication as needed        FINAL CLINICAL IMPRESSION(S) / ED DIAGNOSES   Final diagnoses:  Primary hypertension     Rx / DC Orders   ED Discharge Orders     None        Note:  This document was prepared using Dragon voice recognition software and may include unintentional dictation errors.   Arlander Charleston, MD 03/07/24 1515  "

## 2024-03-07 NOTE — Telephone Encounter (Signed)
 Spoke with pt and advised her that [facility] should be able to access her chart to get what is needed. She states that her last MRI was done outside of [facility]. When she was here on Friday seeing Mallie, she stated a front office staff member helped her access her MRI report and a copy was printed. She could not tell me who helped her. Advised her that we do not have anything in chart as of today that I can fax to [facility]. She is aware that if the MRI was sent to scan then it will be a few days before it can be accessed and once it's scanned, [facility] should be able to access it. Pt verbalized understanding.

## 2024-03-07 NOTE — Progress Notes (Addendum)
 "  Office Visit Note   Patient: Paige Hester           Date of Birth: 1956-02-14           MRN: [id number] Visit Date: 03/07/2024              Requested by: Gretta Comer POUR, NP [address] [place],  [place] [address] PCP: Gretta Comer POUR, NP   Assessment & Plan: Visit Diagnoses:  1. Lumbar strain, initial encounter   2. Chronic bilateral low back pain without sciatica     Plan: Natural history and expected course discussed. Questions answered. Acetaminophen  for pain. Muscle relaxants per medication orders. Recommend PT and referral given. Follow-up in 3 months.  Patient with elevated blood pressure; Pressure rechecked and remained significantly elevated. Recommended patient follow up with urgent care or PCP today for evaluation.  Orders:  Orders Placed This Encounter  Procedures   Ambulatory referral to Physical Therapy   No orders of the defined types were placed in this encounter.    Subjective: Chief Complaint: Lower back pain  HPI  Paige Hester is a 68 y.o. female who presents for evaluation of low back pain. The patient has had no prior back problems. Symptoms have been present for 5 months and are unchanged.  Onset was related to / precipitated by a fall. The pain is located in the right lumbar area and left lumbar area and does not radiate. The pain is described as aching and occurs all day. She rates her pain as moderate. Symptoms are exacerbated by sitting and walking. Symptoms are improved by nothing. She has also tried muscle relaxants and NSAIDs which provided no symptom relief. She has no other symptoms associated with the back pain. Previous history of symptoms: never.   Objective: Vital Signs: BP (!) 186/92 (Cuff Size: Large)   Pulse 75   Ht 5' 5 (1.651 m)   Wt 246 lb 12.8 oz (111.9 kg)   BMI 41.07 kg/m   Physical Exam Gen: Alert, No Acute Distress Lumbar spine: Skin intact, no erythema or induration noted. Inspection and  palpation: paraspinal tenderness noted right and left lumbar spine. Full spine ROM. Straight leg raise: negative bilaterally  Imaging: Radiographs personally reviewed by me; reveal mild to moderate degenerative disc disease of the lumbar spine  MRI personally reviewed by me, reveals bulging of L2-L3, L3-L4, L4-L5, with moderate to severe canal stenosis   PMFS History: Patient Active Problem List   Diagnosis Date Noted   Chronic bilateral low back pain 03/03/2024   Hypercalcemia 02/24/2024   Rash and nonspecific skin eruption 10/09/2023   Acute back pain 10/09/2023   Osteopenia 07/01/2023   Mirena  IUD (intrauterine device) in place since 06/26/2021 06/23/2023   Preoperative clearance 05/11/2023   Genetic testing 04/03/2023   Family history of prostate cancer    Family history of colon cancer    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   Family history of cancer 03/26/2023   Goals of care, counseling/discussion 03/26/2023   History of pediculosis 03/26/2023   Bilateral carotid artery stenosis 11/18/2022   Carpal tunnel syndrome of right wrist 01/31/2022   Bilateral hand pain 01/31/2022   Thickened endometrium    OSA (obstructive sleep apnea) 02/12/2021   Pulmonary hypertension (HCC) 07/19/2020   Aortic atherosclerosis 07/19/2020   Hyperlipidemia 07/19/2020   History of pulmonary embolism 07/11/2020   Cardiomegaly 07/11/2020   Endometrial thickness of 18 mm and  associated bleeding in postmenopausal patient 07/27/2019   Mild dysplasia of cervix (CIN I) 10/12/2018   Prediabetes 11/04/2016   Preventative health care 08/30/2014   Morbid obesity (HCC) 08/10/2013   GERD (gastroesophageal reflux disease) 05/31/2012   Hypokalemia 01/17/2011   MURMUR, CARDIAC, UNDIAGNOSED 09/20/2008   Iron  deficiency anemia 08/28/2008   Essential hypertension 12/19/2006   Past Medical History:  Diagnosis Date   Allergy    Bilateral swelling of feet and  ankles    Family history of colon cancer    Family history of prostate cancer    Gallbladder polyp 2012   GERD (gastroesophageal reflux disease)    Hyperlipidemia    Hypertension    Iron  deficiency anemia    Menopausal symptoms    since 2007   Murmur, cardiac    Other fatigue    Prediabetes    Pulmonary embolism (HCC) 06/2020   Shortness of breath    with exertion; patient going to pulmonary rehabilitation   Shortness of breath on exertion    Sleep apnea    Uses CPAP   Vitamin D  deficiency     Family History  Problem Relation Age of Onset   Depression Mother    Stroke Mother    Sudden death Mother    Heart disease Mother    Hypertension Mother    Heart attack Mother 84   Cancer Father    Prostate cancer Father        metastatic   Heart disease Father 3       CAD, STENT   Coronary artery disease Sister        stent x 2   Coronary artery disease Sister 76       stent placed   Hypertension Sister    Hypertension Sister    Heart disease Brother        Myocardial infarction   Colon cancer Maternal Aunt    Sudden death Other    Depression Other    Colon polyps Neg Hx    Esophageal cancer Neg Hx    Rectal cancer Neg Hx    Stomach cancer Neg Hx    Breast cancer Neg Hx     Past Surgical History:  Procedure Laterality Date   BREAST BIOPSY Left 03/17/2023   US  bx,Ribbon Clip, path pending   BREAST BIOPSY Left 03/17/2023   US  Bx, Coil Clip,path pending   BREAST BIOPSY Left 03/17/2023   US  LT BREAST BX W LOC DEV 1ST LESION IMG BX SPEC US  GUIDE 03/17/2023 [facility]   BREAST BIOPSY Left 03/17/2023   US  LT BREAST BX W LOC DEV EA ADD LESION IMG BX SPEC US  GUIDE 03/17/2023 [facility]   COLONOSCOPY  2010   2020   HYSTEROSCOPY WITH D & C N/A 07/27/2019   Procedure: DILATATION AND CURETTAGE /HYSTEROSCOPY, Polypectomy;  Surgeon: Herchel Gloris LABOR, MD;  Location: [facility];  Service: Gynecology;  Laterality: N/A;   HYSTEROSCOPY WITH D & C N/A  06/26/2021   Procedure: DILATATION AND CURETTAGE /HYSTEROSCOPY;  Surgeon: Herchel Gloris LABOR, MD;  Location: [facility];  Service: Gynecology;  Laterality: N/A;   INTRAUTERINE DEVICE (IUD) INSERTION N/A 06/26/2021   Procedure: INTRAUTERINE DEVICE (IUD) INSERTION;  Surgeon: Herchel Gloris LABOR, MD;  Location:  SURGERY C[facility];  Service: Gynecology;  Laterality: N/A;   Social History   Occupational History   Occupation: Retired  Tobacco Use   Smoking status: Former    Current packs/day: 0.00    Average  packs/day: 1 pack/day for 27.0 years (27.0 ttl pk-yrs)    Types: Cigarettes    Start date: 16    Quit date: 1999    Years since quitting: 26.9   Smokeless tobacco: Never  Vaping Use   Vaping status: Never Used  Substance and Sexual Activity   Alcohol use: Yes    Alcohol/week: 7.0 standard drinks of alcohol    Types: 7 Glasses of wine per week    Comment: 1-2 glasses of wine per night   Drug use: No   Sexual activity: Yes   Current Outpatient Medications  Medication Instructions   albuterol  (VENTOLIN  HFA) 108 (90 Base) MCG/ACT inhaler 2 puffs, Inhalation, Every 6 hours PRN   anastrozole  (ARIMIDEX ) 1 mg, Oral, Daily   aspirin  EC 81 mg, Daily   carvedilol (COREG) 3.125 mg, 2 times daily with meals   cholecalciferol (VITAMIN D3) 1,000 Units, Daily   cyclobenzaprine  (FLEXERIL ) 10 mg, Oral, Daily at bedtime, For back pain   fluticasone  (FLONASE ) 50 MCG/ACT nasal spray 1 spray, Each Nare, 2 times daily PRN   furosemide (LASIX) 20 mg, Daily   Iron -Vitamin C  65-125 MG TABS 1 tablet, Oral, Daily   loratadine (CLARITIN) 10 mg, Daily   Melatonin 10 MG TABS As needed   Naproxen  DR 500 mg, Oral, 2 times daily PRN   omeprazole  (PRILOSEC) 20 mg, Oral, Daily, for heartburn.   Oyster Shell (OYSTER CALCIUM ) 500 MG TABS tablet 600 mg of elemental calcium , 2 times daily   Potassium Bicarbonate  99 mg, Oral, 2 times daily   rosuvastatin  (CRESTOR ) 20 mg, Oral, Daily    spironolactone (ALDACTONE) 25 mg, Daily   Allergies as of 03/07/2024   (No Known Allergies)    "

## 2024-03-11 ENCOUNTER — Ambulatory Visit: Payer: Self-pay

## 2024-03-11 NOTE — Telephone Encounter (Signed)
 Noted, will evaluate.

## 2024-03-11 NOTE — Telephone Encounter (Signed)
 FYI Only or Action Required?: FYI only for provider: Pt advised to keep follow-up appt and to call back if worsening symptoms.  Patient was last seen in primary care on 03/03/2024 by Gretta Comer POUR, NP.  Called Nurse Triage reporting Hypertension.  Symptoms began several days ago.  Interventions attempted: Prescription medications: yes.  Symptoms are: unchanged.  Triage Disposition: See PCP Within 2 Weeks  Patient/caregiver understands and will follow disposition?:   Reason for Disposition  [1] Systolic BP >= 130 OR Diastolic >= 80 AND [2] taking BP medications  Answer Assessment - Initial Assessment Questions Pt wanted to speak with a TN d/t high blood pressure readings. Pt saw PCP a week ago and has a follow-up 03/24/24. Patient reports high blood pressure reading and denies headache, SOB, chest pain. Pt states she has had a TIA in the past when her BP readings have been elevated. Pt took BP while on the call - first reading 199/83 (patient talking), 2nd reading 153/79. Pt advised to keep follow-up visit with PCP and call back if any worsening symptoms.  Readings:  03/11/24: 99/83, 153/79   03/10/24: 165/80   03/09/24: 174/79, 199/88, 193/95   03/08/24: 197/89, 226/102  03/07/24:165/89, 187/99   Baseline 150s/160s, pt taking BP medications as directed.   Takes BP readings before taking medications and then 30 minutes after.  1. BLOOD PRESSURE: What is your blood pressure? Did you take at least two measurements 5 minutes apart?     Takes reading, then medication, and then repeats blood pressure 30 minutes after.  2. ONSET: When did you take your blood pressure?     Now (03/11/24) - 199/83. 153/79 (retaken 5 minutes later) 3. HOW: How did you take your blood pressure? (e.g., automatic home BP monitor, visiting nurse)     Automatic (copilot iQ) 4. HISTORY: Do you have a history of high blood pressure?     Yes  5. MEDICINES: Are you taking any medicines for  blood pressure? Have you missed any doses recently?     yes 6. OTHER SYMPTOMS: Do you have any symptoms? (e.g., blurred vision, chest pain, difficulty breathing, headache, weakness)     denies  Protocols used: Blood Pressure - High-A-AH

## 2024-03-15 ENCOUNTER — Inpatient Hospital Stay

## 2024-03-16 ENCOUNTER — Inpatient Hospital Stay

## 2024-03-16 ENCOUNTER — Inpatient Hospital Stay: Admitting: Occupational Therapy

## 2024-03-16 ENCOUNTER — Ambulatory Visit (INDEPENDENT_AMBULATORY_CARE_PROVIDER_SITE_OTHER)

## 2024-03-16 VITALS — Ht 65.0 in | Wt 244.0 lb

## 2024-03-16 DIAGNOSIS — M8580 Other specified disorders of bone density and structure, unspecified site: Secondary | ICD-10-CM

## 2024-03-16 DIAGNOSIS — Z6841 Body Mass Index (BMI) 40.0 and over, adult: Secondary | ICD-10-CM

## 2024-03-16 DIAGNOSIS — R293 Abnormal posture: Secondary | ICD-10-CM

## 2024-03-16 DIAGNOSIS — Z0000 Encounter for general adult medical examination without abnormal findings: Secondary | ICD-10-CM | POA: Diagnosis not present

## 2024-03-16 DIAGNOSIS — Z809 Family history of malignant neoplasm, unspecified: Secondary | ICD-10-CM

## 2024-03-16 DIAGNOSIS — C50212 Malignant neoplasm of upper-inner quadrant of left female breast: Secondary | ICD-10-CM

## 2024-03-16 DIAGNOSIS — R5383 Other fatigue: Secondary | ICD-10-CM

## 2024-03-16 DIAGNOSIS — L905 Scar conditions and fibrosis of skin: Secondary | ICD-10-CM

## 2024-03-16 LAB — VITAMIN D 25 HYDROXY (VIT D DEFICIENCY, FRACTURES): Vit D, 25-Hydroxy: 36.2 ng/mL (ref 30–100)
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
## 2024-03-16 NOTE — Patient Instructions (Signed)
 Paige Hester,  Thank you for taking the time for your Medicare Wellness Visit. I appreciate your continued commitment to your health goals. Please review the care plan we discussed, and feel free to reach out if I can assist you further.  Please note that Annual Wellness Visits do not include a physical exam. Some assessments may be limited, especially if the visit was conducted virtually. If needed, we may recommend an in-person follow-up with your provider.  Ongoing Care Seeing your primary care provider every 3 to 6 months helps us  monitor your health and provide consistent, personalized care.   Referrals If a referral was made during today's visit and you haven't received any updates within two weeks, please contact the referred provider directly to check on the status.  Recommended Screenings:  Health Maintenance  Topic Date Due   Medicare Annual Wellness Visit  03/15/2024   Breast Cancer Screening  04/08/2024   Osteoporosis screening with Bone Density Scan  02/23/2025   Colon Cancer Screening  09/23/2025   Pneumococcal Vaccine for age over 33  Completed   Flu Shot  Completed   Hepatitis C Screening  Completed   Zoster (Shingles) Vaccine  Completed   Meningitis B Vaccine  Aged Out   DTaP/Tdap/Td vaccine  Discontinued   COVID-19 Vaccine  Discontinued       03/13/2024    8:12 PM  Advanced Directives  Does Patient Have a Medical Advance Directive? No  Would patient like information on creating a medical advance directive? No - Patient declined    Vision: Annual vision screenings are recommended for early detection of glaucoma, cataracts, and diabetic retinopathy. These exams can also reveal signs of chronic conditions such as diabetes and high blood pressure.  Dental: Annual dental screenings help detect early signs of oral cancer, gum disease, and other conditions linked to overall health, including heart disease and diabetes.  Please see the attached documents for  additional preventive care recommendations.

## 2024-03-17 LAB — PTH, INTACT AND CALCIUM
Calcium, Total (PTH): 10.1 mg/dL (ref 8.7–10.3)
PTH: 67 pg/mL — ABNORMAL HIGH (ref 15–65)

## 2024-03-18 ENCOUNTER — Ambulatory Visit: Admitting: Primary Care

## 2024-03-18 ENCOUNTER — Encounter: Payer: Self-pay | Admitting: Primary Care

## 2024-03-18 ENCOUNTER — Ambulatory Visit: Payer: Self-pay | Admitting: Oncology

## 2024-03-18 VITALS — BP 138/60 | HR 73 | Temp 97.9°F | Ht 65.0 in | Wt 250.2 lb

## 2024-03-18 DIAGNOSIS — I6523 Occlusion and stenosis of bilateral carotid arteries: Secondary | ICD-10-CM | POA: Diagnosis not present

## 2024-03-18 DIAGNOSIS — I10 Essential (primary) hypertension: Secondary | ICD-10-CM | POA: Diagnosis not present

## 2024-03-18 MED ORDER — CARVEDILOL 6.25 MG PO TABS: 6.2500 mg | ORAL_TABLET | Freq: Two times a day (BID) | ORAL | 0 refills | Status: AC
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
## 2024-03-18 NOTE — Assessment & Plan Note (Addendum)
 Above goal during prior visits and home readings, improved today. Would recommend a blood pressure goal of less than 130/80 given her comorbidities.  Will increase carvedilol 6.25 mg twice daily, continue spironolactone 25 mg daily for now. She will see cardiology in 2 weeks for follow-up.  Hospital notes and labs reviewed from care everywhere from August/September 2025.

## 2024-03-18 NOTE — Patient Instructions (Signed)
 We increased the dose of your carvedilol to 6.25 mg twice daily for blood pressure.  Continue taking spironolactone 25 mg daily for now.  Keep an eye on your heart rate and blood pressure.  Follow-up with cardiology as scheduled.  It was a pleasure to see you today!

## 2024-03-21 ENCOUNTER — Other Ambulatory Visit: Payer: Self-pay | Admitting: Primary Care

## 2024-03-21 NOTE — Telephone Encounter (Unsigned)
 Copied from CRM [id number]. Topic: Clinical - Medication Refill >> Mar 21, 2024  1:20 PM Tiffini S wrote: Medication:   furosemide (LASIX) 20 MG tablet  Has the patient contacted their pharmacy? Yes (Agent: If no, request that the patient contact the pharmacy for the refill. If patient does not wish to contact the pharmacy document the reason why and proceed with request.) (Agent: If yes, when and what did the pharmacy advise?)  This is the patient's preferred pharmacy:  [facility] [address], [place] - [address] [place] [address] Phone: [phone] Fax: [phone]  Is this the correct pharmacy for this prescription? Yes If no, delete pharmacy and type the correct one.   Has the prescription been filled recently? Yes  Is the patient out of the medication? No  Has the patient been seen for an appointment in the last year OR does the patient have an upcoming appointment? Yes  Can we respond through MyChart? Yes  Agent: Please be advised that Rx refills may take up to 3 business days. We ask that you follow-up with your pharmacy.

## 2024-03-23 ENCOUNTER — Inpatient Hospital Stay: Attending: Oncology | Admitting: *Deleted

## 2024-03-23 NOTE — Progress Notes (Signed)
 [facility] Clinical Social Work  Patient was referred by the [employer] to schedule advance directives appointment. CSW contacted patient by phone.  Patient interested in completing advance directives- scheduled appointment with CSW Zavala and mailed blank AD packet.  The patient is also interested in massage. CSW aquired signature from MD and will leave massage packet at front desk for patient to pick up.    Tinnie Ro, LCSW  Clinical Social Worker [facility]

## 2024-03-24 ENCOUNTER — Ambulatory Visit: Admitting: Primary Care

## 2024-03-26 ENCOUNTER — Other Ambulatory Visit: Payer: Self-pay

## 2024-03-26 ENCOUNTER — Inpatient Hospital Stay (HOSPITAL_COMMUNITY)
Admission: EM | Admit: 2024-03-26 | Discharge: 2024-03-28 | DRG: 040 | Disposition: A | Discharge status: Home / Self Care

## 2024-03-26 ENCOUNTER — Encounter (HOSPITAL_COMMUNITY): Payer: Self-pay

## 2024-03-26 ENCOUNTER — Emergency Department (HOSPITAL_COMMUNITY)

## 2024-03-26 ENCOUNTER — Inpatient Hospital Stay (HOSPITAL_COMMUNITY)

## 2024-03-26 DIAGNOSIS — Z8042 Family history of malignant neoplasm of prostate: Secondary | ICD-10-CM | POA: Diagnosis not present

## 2024-03-26 DIAGNOSIS — R29818 Other symptoms and signs involving the nervous system: Secondary | ICD-10-CM | POA: Diagnosis present

## 2024-03-26 DIAGNOSIS — Z79811 Long term (current) use of aromatase inhibitors: Secondary | ICD-10-CM | POA: Diagnosis not present

## 2024-03-26 DIAGNOSIS — R569 Unspecified convulsions: Secondary | ICD-10-CM | POA: Diagnosis not present

## 2024-03-26 DIAGNOSIS — E785 Hyperlipidemia, unspecified: Secondary | ICD-10-CM | POA: Diagnosis present

## 2024-03-26 DIAGNOSIS — I10 Essential (primary) hypertension: Secondary | ICD-10-CM | POA: Diagnosis present

## 2024-03-26 DIAGNOSIS — G458 Other transient cerebral ischemic attacks and related syndromes: Secondary | ICD-10-CM | POA: Diagnosis not present

## 2024-03-26 DIAGNOSIS — Z8249 Family history of ischemic heart disease and other diseases of the circulatory system: Secondary | ICD-10-CM

## 2024-03-26 DIAGNOSIS — I639 Cerebral infarction, unspecified: Secondary | ICD-10-CM | POA: Diagnosis present

## 2024-03-26 DIAGNOSIS — R29810 Facial weakness: Secondary | ICD-10-CM | POA: Diagnosis present

## 2024-03-26 DIAGNOSIS — J449 Chronic obstructive pulmonary disease, unspecified: Secondary | ICD-10-CM | POA: Diagnosis present

## 2024-03-26 DIAGNOSIS — G936 Cerebral edema: Secondary | ICD-10-CM | POA: Diagnosis present

## 2024-03-26 DIAGNOSIS — G4730 Sleep apnea, unspecified: Secondary | ICD-10-CM | POA: Diagnosis present

## 2024-03-26 DIAGNOSIS — Z6841 Body Mass Index (BMI) 40.0 and over, adult: Secondary | ICD-10-CM | POA: Diagnosis not present

## 2024-03-26 DIAGNOSIS — C719 Malignant neoplasm of brain, unspecified: Secondary | ICD-10-CM | POA: Diagnosis present

## 2024-03-26 DIAGNOSIS — Z800 Family history of malignant neoplasm of digestive organs: Secondary | ICD-10-CM

## 2024-03-26 DIAGNOSIS — Z7982 Long term (current) use of aspirin: Secondary | ICD-10-CM

## 2024-03-26 DIAGNOSIS — Z86711 Personal history of pulmonary embolism: Secondary | ICD-10-CM

## 2024-03-26 DIAGNOSIS — I6602 Occlusion and stenosis of left middle cerebral artery: Principal | ICD-10-CM | POA: Diagnosis present

## 2024-03-26 DIAGNOSIS — Z79899 Other long term (current) drug therapy: Secondary | ICD-10-CM

## 2024-03-26 DIAGNOSIS — K219 Gastro-esophageal reflux disease without esophagitis: Secondary | ICD-10-CM | POA: Diagnosis present

## 2024-03-26 DIAGNOSIS — Z87891 Personal history of nicotine dependence: Secondary | ICD-10-CM | POA: Diagnosis not present

## 2024-03-26 DIAGNOSIS — I708 Atherosclerosis of other arteries: Secondary | ICD-10-CM | POA: Diagnosis present

## 2024-03-26 DIAGNOSIS — Z823 Family history of stroke: Secondary | ICD-10-CM

## 2024-03-26 DIAGNOSIS — R29708 NIHSS score 8: Secondary | ICD-10-CM | POA: Diagnosis present

## 2024-03-26 DIAGNOSIS — M549 Dorsalgia, unspecified: Secondary | ICD-10-CM | POA: Diagnosis present

## 2024-03-26 DIAGNOSIS — F419 Anxiety disorder, unspecified: Secondary | ICD-10-CM | POA: Diagnosis present

## 2024-03-26 DIAGNOSIS — R1310 Dysphagia, unspecified: Secondary | ICD-10-CM | POA: Diagnosis present

## 2024-03-26 DIAGNOSIS — Z818 Family history of other mental and behavioral disorders: Secondary | ICD-10-CM

## 2024-03-26 DIAGNOSIS — R29700 NIHSS score 0: Secondary | ICD-10-CM | POA: Diagnosis present

## 2024-03-26 DIAGNOSIS — Z853 Personal history of malignant neoplasm of breast: Secondary | ICD-10-CM

## 2024-03-26 DIAGNOSIS — I69392 Facial weakness following cerebral infarction: Secondary | ICD-10-CM | POA: Diagnosis not present

## 2024-03-26 DIAGNOSIS — D509 Iron deficiency anemia, unspecified: Secondary | ICD-10-CM | POA: Diagnosis present

## 2024-03-26 DIAGNOSIS — G459 Transient cerebral ischemic attack, unspecified: Secondary | ICD-10-CM | POA: Diagnosis not present

## 2024-03-26 DIAGNOSIS — R531 Weakness: Secondary | ICD-10-CM | POA: Diagnosis present

## 2024-03-26 LAB — DIFFERENTIAL
Abs Immature Granulocytes: 0.02 K/uL (ref 0.00–0.07)
Basophils Absolute: 0 K/uL (ref 0.0–0.1)
Basophils Relative: 1 %
Eosinophils Absolute: 0.6 K/uL — ABNORMAL HIGH (ref 0.0–0.5)
Eosinophils Relative: 11 %
Immature Granulocytes: 0 %
Lymphocytes Relative: 25 %
Lymphs Abs: 1.3 K/uL (ref 0.7–4.0)
Monocytes Absolute: 0.6 K/uL (ref 0.1–1.0)
Monocytes Relative: 12 %
Neutro Abs: 2.7 K/uL (ref 1.7–7.7)
Neutrophils Relative %: 51 %

## 2024-03-26 LAB — URINE DRUG SCREEN
Amphetamines: NEGATIVE
Barbiturates: NEGATIVE
Benzodiazepines: NEGATIVE
Cocaine: NEGATIVE
Fentanyl: NEGATIVE
Methadone Scn, Ur: NEGATIVE
Opiates: NEGATIVE
Tetrahydrocannabinol: NEGATIVE

## 2024-03-26 LAB — COMPREHENSIVE METABOLIC PANEL WITH GFR
ALT: 12 U/L (ref 0–44)
AST: 22 U/L (ref 15–41)
Albumin: 3.9 g/dL (ref 3.5–5.0)
Alkaline Phosphatase: 79 U/L (ref 38–126)
Anion gap: 8 (ref 5–15)
BUN: 12 mg/dL (ref 8–23)
CO2: 25 mmol/L (ref 22–32)
Calcium: 10 mg/dL (ref 8.9–10.3)
Chloride: 107 mmol/L (ref 98–111)
Creatinine, Ser: 0.84 mg/dL (ref 0.44–1.00)
GFR, Estimated: >60 mL/min
Glucose, Bld: 106 mg/dL — ABNORMAL HIGH (ref 70–99)
Potassium: 4.8 mmol/L (ref 3.5–5.1)
Sodium: 140 mmol/L (ref 135–145)
Total Bilirubin: 0.4 mg/dL (ref 0.0–1.2)
Total Protein: 6.9 g/dL (ref 6.5–8.1)

## 2024-03-26 LAB — CBC
HCT: 38.1 % (ref 36.0–46.0)
Hemoglobin: 12.5 g/dL (ref 12.0–15.0)
MCH: 29.3 pg (ref 26.0–34.0)
MCHC: 32.8 g/dL (ref 30.0–36.0)
MCV: 89.4 fL (ref 80.0–100.0)
Platelets: 246 K/uL (ref 150–400)
RBC: 4.26 MIL/uL (ref 3.87–5.11)
RDW: 13.7 % (ref 11.5–15.5)
WBC: 5.2 K/uL (ref 4.0–10.5)
nRBC: 0 % (ref 0.0–0.2)

## 2024-03-26 LAB — I-STAT CHEM 8, ED
BUN: 12 mg/dL (ref 8–23)
Calcium, Ion: 1.27 mmol/L (ref 1.15–1.40)
Chloride: 107 mmol/L (ref 98–111)
Creatinine, Ser: 0.9 mg/dL (ref 0.44–1.00)
Glucose, Bld: 104 mg/dL — ABNORMAL HIGH (ref 70–99)
HCT: 35 % — ABNORMAL LOW (ref 36.0–46.0)
Hemoglobin: 11.9 g/dL — ABNORMAL LOW (ref 12.0–15.0)
Potassium: 4.5 mmol/L (ref 3.5–5.1)
Sodium: 141 mmol/L (ref 135–145)
TCO2: 24 mmol/L (ref 22–32)

## 2024-03-26 LAB — ETHANOL: Alcohol, Ethyl (B): <15 mg/dL

## 2024-03-26 LAB — PROTIME-INR
INR: 0.9 (ref 0.8–1.2)
Prothrombin Time: 13 s (ref 11.4–15.2)

## 2024-03-26 LAB — APTT: aPTT: 30 s (ref 24–36)

## 2024-03-26 LAB — CBG MONITORING, ED: Glucose-Capillary: 106 mg/dL — ABNORMAL HIGH (ref 70–99)

## 2024-03-26 MED ORDER — LABETALOL HCL 5 MG/ML IV SOLN
10.0000 mg | Freq: Once | INTRAVENOUS | Status: AC
  Administered 2024-03-26: 10 mg via INTRAVENOUS

## 2024-03-26 MED ORDER — PANTOPRAZOLE SODIUM 40 MG IV SOLR
40.0000 mg | Freq: Every day | INTRAVENOUS | Status: DC
  Administered 2024-03-26: 40 mg via INTRAVENOUS
  Filled 2024-03-26: qty 10

## 2024-03-26 MED ORDER — IOHEXOL 350 MG/ML SOLN
100.0000 mL | Freq: Once | INTRAVENOUS | Status: AC | PRN
  Administered 2024-03-26: 100 mL via INTRAVENOUS

## 2024-03-26 MED ORDER — ACETAMINOPHEN 325 MG PO TABS
650.0000 mg | ORAL_TABLET | ORAL | Status: DC | PRN
  Administered 2024-03-26 – 2024-03-27 (×2): 650 mg via ORAL
  Filled 2024-03-26 (×2): qty 2

## 2024-03-26 MED ORDER — SODIUM CHLORIDE 0.9 % IV SOLN: INTRAVENOUS | Status: DC

## 2024-03-26 MED ORDER — SODIUM CHLORIDE 0.9% FLUSH: 3.0000 mL | Freq: Once | INTRAVENOUS | Status: DC

## 2024-03-26 MED ORDER — CLEVIDIPINE BUTYRATE 0.5 MG/ML IV EMUL
0.0000 mg/h | INTRAVENOUS | Status: DC
  Administered 2024-03-26: 6 mg/h via INTRAVENOUS
  Administered 2024-03-26: 4 mg/h via INTRAVENOUS
  Administered 2024-03-26: 2 mg/h via INTRAVENOUS
  Filled 2024-03-26: qty 100

## 2024-03-26 MED ORDER — LABETALOL HCL 5 MG/ML IV SOLN
10.0000 mg | INTRAVENOUS | Status: DC | PRN
  Administered 2024-03-28: 10 mg via INTRAVENOUS
  Filled 2024-03-26: qty 4

## 2024-03-26 MED ORDER — SENNOSIDES-DOCUSATE SODIUM 8.6-50 MG PO TABS: 1.0000 | ORAL_TABLET | Freq: Every evening | ORAL | Status: DC | PRN

## 2024-03-26 MED ORDER — STROKE: EARLY STAGES OF RECOVERY BOOK
Freq: Once | Status: AC
  Filled 2024-03-26: qty 1

## 2024-03-26 MED ORDER — ACETAMINOPHEN 650 MG RE SUPP: 650.0000 mg | RECTAL | Status: DC | PRN

## 2024-03-26 MED ORDER — ACETAMINOPHEN 160 MG/5ML PO SOLN: 650.0000 mg | ORAL | Status: DC | PRN

## 2024-03-26 MED ORDER — TENECTEPLASE 25 MG IV KIT
25.0000 mg | PACK | Freq: Once | INTRAVENOUS | Status: AC
  Administered 2024-03-26: 25 mg via INTRAVENOUS
  Filled 2024-03-26: qty 5
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
## 2024-03-26 NOTE — ED Notes (Signed)
 Patient states she was not feeling right and states she lowered herself to the floor was able to crawl to the front door , there were neighbors building a house next to her and she yelled until someone heard her and they came to assist. And called 911

## 2024-03-26 NOTE — Progress Notes (Signed)
 PHARMACIST CODE STROKE RESPONSE  Notified to mix TNK at 1111 by Dr. Delores TNK preparation completed at 1112  TNK dose = 25 mg IV over 5 seconds.   Issues/delays encountered (if applicable): SBP control requiring multiple agents  Paige Hester 03/26/2024 1:18 PM

## 2024-03-26 NOTE — Progress Notes (Signed)
 PT Cancellation Note  Patient Details Name: Paige Hester MRN: [id number] DOB: Jun 24, 1955   Cancelled Treatment:    Reason Eval/Treat Not Completed: Active bedrest order after TNK administration. Will follow for eval when medically ready.   Rodgers ORN [facility] 03/26/2024, 2:53 PM Rodgers Opal PT Acute [employer] [phone]

## 2024-03-26 NOTE — ED Provider Notes (Signed)
 " [facility] AT [facility] Provider Note   CSN: [id number] Arrival date & time: 03/26/24  1100  An emergency department physician performed an initial assessment on this suspected stroke patient at 1102.  Patient presents with: No chief complaint on file.   Paige Hester is a 69 y.o. female.   HPI Last known well 8:30 AM.  Patient awake and with symptoms of right-sided weakness of the arm and the leg after awakening with normal function.  Patient does take Eliquis .  Last dose    Prior to Admission medications  Medication Sig Start Date End Date Taking? Authorizing Provider  albuterol  (VENTOLIN  HFA) 108 (90 Base) MCG/ACT inhaler Inhale 2 puffs into the lungs every 6 (six) hours as needed for wheezing or shortness of breath. 07/11/20  Yes Lang Dover, MD  anastrozole  (ARIMIDEX ) 1 MG tablet Take 1 tablet (1 mg total) by mouth daily. 02/24/24  Yes Babara Call, MD  aspirin  EC 81 MG tablet Take 81 mg by mouth daily. Swallow whole.   Yes [provider]  carvedilol  (COREG ) 6.25 MG tablet Take 1 tablet (6.25 mg total) by mouth 2 (two) times daily with a meal. for blood pressure. 03/18/24  Yes Clark, Katherine K, NP  Cholecalciferol (VITAMIN D3) 1000 UNITS tablet Take 1,000 Units by mouth daily.   Yes [provider]  cyclobenzaprine  (FLEXERIL ) 10 MG tablet Take 1 tablet (10 mg total) by mouth at bedtime. For back pain Patient taking differently: Take 10 mg by mouth as needed for muscle spasms. For back pain 03/03/24  Yes Clark, Katherine K, NP  fluticasone  (FLONASE ) 50 MCG/ACT nasal spray Place 1 spray into both nostrils 2 (two) times daily as needed for allergies or rhinitis. 09/08/19  Yes Clark, Katherine K, NP  furosemide  (LASIX ) 20 MG tablet Take 20 mg by mouth daily.   Yes [provider]  Iron -Vitamin C  65-125 MG TABS Take 1 tablet by mouth daily. 03/31/23  Yes Babara Call, MD  loratadine  (CLARITIN ) 10 MG tablet Take 10 mg by mouth  daily.   Yes [provider]  Melatonin 10 MG TABS Take 10 mg by mouth at bedtime.   Yes [provider]  omeprazole  (PRILOSEC) 20 MG capsule Take 1 capsule (20 mg total) by mouth daily. for heartburn. Patient taking differently: Take 20 mg by mouth every other day. for heartburn. 01/23/23  Yes Gretta Comer POUR, NP  Oyster Shell (OYSTER CALCIUM ) 500 MG TABS tablet Take 600 mg of elemental calcium  by mouth 2 (two) times daily.   Yes [provider]  rosuvastatin  (CRESTOR ) 20 MG tablet Take 1 tablet (20 mg total) by mouth daily. 01/18/24  Yes Gollan, Timothy J, MD  spironolactone  (ALDACTONE ) 25 MG tablet Take 25 mg by mouth daily.   Yes [provider]    Allergies: Patient has no known allergies.    Review of Systems  Updated Vital Signs BP 135/77 (BP Location: Right Wrist)   Pulse 72   Temp 98.2 F (36.8 C) (Oral)   Resp 18   Wt 113.2 kg   SpO2 100%   BMI 41.53 kg/m   Physical Exam Constitutional:      Comments: Patient is awake and alert.  She is tearful.  Airway is intact.  No respiratory distress.  HENT:     Mouth/Throat:     Pharynx: Oropharynx is clear.  Pulmonary:     Effort: Pulmonary effort is normal.  Neurological:     Comments: First  exam being performed by neurology on EMS stretcher in hallway: Patient is awake and alert.  Airway is protected.  She is tearful.  She can make effort to elevate her right arm appears to be about 50% elevation relative to normal function on the left.  Patient also can make effort to elevate the right lower extremity again appears about 50% effort relative to the left.  Airway is cleared for CT.   Recheck 12: 05 Patient's neurologic symptoms have completely resolved.  Patient has been treated by neurology with TNK and Cleviprex .  Patient reports that she feels back to normal.  She denies any headache.  Mental status is clear.  Alert and oriented.  Speech is clear.  Follows all commands without difficulty.   Upper extremity can elevate symmetrically.  Bilateral lower extremity 5\5. Heart regular 2\6 systolic ejection murmur. Lungs clear to auscultation. Abdomen soft nontender no guarding. Lower extremity musculoskeletal: No peripheral edema.  (all labs ordered are listed, but only abnormal results are displayed) Labs Reviewed  DIFFERENTIAL - Abnormal; Notable for the following components:      Result Value   Eosinophils Absolute 0.6 (*)    All other components within normal limits  COMPREHENSIVE METABOLIC PANEL WITH GFR - Abnormal; Notable for the following components:   Glucose, Bld 106 (*)    All other components within normal limits  I-STAT CHEM 8, ED - Abnormal; Notable for the following components:   Glucose, Bld 104 (*)    Hemoglobin 11.9 (*)    HCT 35.0 (*)    All other components within normal limits  CBG MONITORING, ED - Abnormal; Notable for the following components:   Glucose-Capillary 106 (*)    All other components within normal limits  PROTIME-INR  APTT  CBC  ETHANOL  HIV ANTIBODY (ROUTINE TESTING W REFLEX)    EKG: None EKG not uploading from MUSE.  Sinus rhythm rate controlled.  No acute ischemic appearance.  No significant change compared to previous tracings.  Radiology: CT HEAD CODE STROKE WO CONTRAST Result Date: 03/26/2024 EXAM: CT HEAD WITHOUT CONTRAST 03/26/2024 11:10:58 AM TECHNIQUE: CT of the head was performed without the administration of intravenous contrast. Automated exposure control, iterative reconstruction, and/or weight based adjustment of the mA/kV was utilized to reduce the radiation dose to as low as reasonably achievable. COMPARISON: None available. CLINICAL HISTORY: 69 year old female. Acute neurological deficit, stroke suspected. FINDINGS: BRAIN AND VENTRICLES: Brain volume is normal for age. No acute hemorrhage. No evidence of acute infarct. No hydrocephalus. No extra-axial collection. No mass effect or midline shift. Mild for age patchy  periventricular white matter hypodensity in both hemispheres. Otherwise maintained gray-white differentiation. Symmetric basal ganglia vascular calcifications. No suspicious intracranial vascular hyperdensity. ORBITS: No gaze deviation. SINUSES: Paranasal sinuses, tympanic cavities, and mastoids are well aerated. SOFT TISSUES AND SKULL: No acute soft tissue abnormality. No skull fracture. Calcified atherosclerosis at the skull base. ALBERTA STROKE PROGRAM EARLY CT SCORE (ASPECTS): Ganglionic (caudate, IC, lentiform nucleus, insula, M1-M3): 7 Supraganglionic (M4-M6): 3 Total: 10 IMPRESSION: 1. No acute intracranial abnormality.   ASPECTS 10. 2. These results were communicated to Dr. Nichola at 1123 hours on 03/26/2024 by text page via the [facility] messaging system. Electronically signed by: Helayne Hurst MD MD 03/26/2024 11:24 AM EST RP Workstation: [id number]     Procedures   Medications Ordered in the ED  sodium chloride  flush (NS) 0.9 % injection 3 mL (has no administration in time range)   stroke: early stages of recovery book (has no  administration in time range)  0.9 %  sodium chloride  infusion (has no administration in time range)  acetaminophen  (TYLENOL ) tablet 650 mg (has no administration in time range)    Or  acetaminophen  (TYLENOL ) 160 MG/5ML solution 650 mg (has no administration in time range)    Or  acetaminophen  (TYLENOL ) suppository 650 mg (has no administration in time range)  senna-docusate (Senokot-S) tablet 1 tablet (has no administration in time range)  pantoprazole  (PROTONIX ) injection 40 mg (has no administration in time range)  clevidipine  (CLEVIPREX ) infusion 0.5 mg/mL (10 mg/hr Intravenous Infusion Verify 03/26/24 1202)  tenecteplase  (TNKASE ) injection for stroke 25 mg (25 mg Intravenous Given 03/26/24 1123)  labetalol  (NORMODYNE ) injection 10 mg (10 mg Intravenous Given 03/26/24 1113)    Followed by  labetalol  (NORMODYNE ) injection 10 mg (10 mg Intravenous Given 03/26/24 1115)   iohexol  (OMNIPAQUE ) 350 MG/ML injection 100 mL (100 mLs Intravenous Contrast Given 03/26/24 1138)                                    Medical Decision Making Amount and/or Complexity of Data Reviewed Labs: ordered. Radiology: ordered.  Risk Prescription drug management. Decision regarding hospitalization.  Patient presented with onset of sided weakness of acute onset at 830 this morning.  On arrival to the emergency department, patient arrives as code stroke.  Airway was protected and mental status was awake and alert.  Neurology escorted patient to CT suite and determined she was appropriate for TNK.  Blood pressure was also significantly elevated and Cleviprex  initiated by neurology.  I have reassessed the patient after return to ED room and she has had complete resolution of symptoms.  She is well in appearance.  Blood pressures are normotensive.  Neurology has written admission orders.  Will continue to monitor for any changes but at this time patient is doing well.      Final diagnoses:  Cerebrovascular accident (CVA), unspecified mechanism [facility])    ED Discharge Orders     None          Armenta Canning, MD 03/26/24 1220  "

## 2024-03-26 NOTE — Code Documentation (Signed)
 Stroke Response Nurse Documentation Code Documentation  AAVYA SHAFER is a 69 y.o. female arriving to [facility]  via [facility] on 03/26/2024 with past medical hx of HTN. On Eliquis  (Apixiban) only when she travels. Code stroke was activated by EMS.   Patient from home where she was LKW at 0830 and now complaining of Right sided weakness and slurred speech. Per patient, she was not feeling right this morning, lowered herself to the floor and crawled to the front door and alerted a neighbor that she needed help.   Stroke team at the bedside on patient arrival. Labs drawn and patient cleared for CT by EDP. Patient to CT with team. NIHSS 8, see documentation for details and code stroke times. Patient with right facial droop, right arm weakness, right leg weakness, right decreased sensation, and dysarthria  on exam. The following imaging was completed:  CT Head, CTA, and CTP. Patient is a candidate for IV Thrombolytic due to disabling symptoms per MD. Patient is not a candidate for IR due to no LVO noted on imaging per MD.   Care Plan: VS/NIHSS q42min x2hours, q33min x6hours, then q1hour; BP Goal <180/105; NPO until swallow screen passed.   Process Delays Noted: BP control and patient decision  Bedside handoff with ED RN Burnard.    Annabella DELENA Bame  Stroke Response RN

## 2024-03-27 ENCOUNTER — Inpatient Hospital Stay (HOSPITAL_COMMUNITY)

## 2024-03-27 DIAGNOSIS — R569 Unspecified convulsions: Secondary | ICD-10-CM | POA: Diagnosis not present

## 2024-03-27 DIAGNOSIS — G459 Transient cerebral ischemic attack, unspecified: Secondary | ICD-10-CM | POA: Diagnosis not present

## 2024-03-27 LAB — ECHOCARDIOGRAM COMPLETE
AR max vel: 2.09 cm2
AV Area VTI: 2.18 cm2
AV Area mean vel: 2.17 cm2
AV Mean grad: 7 mmHg
AV Peak grad: 13.5 mmHg
Ao pk vel: 1.84 m/s
Area-P 1/2: 2.73 cm2
Calc EF: 69.4 %
MV VTI: 1.47 cm2
S' Lateral: 2.7 cm
Single Plane A2C EF: 66.3 %
Single Plane A4C EF: 73 %
Weight: 3992.97 [oz_av]

## 2024-03-27 LAB — LIPID PANEL
Cholesterol: 122 mg/dL (ref 0–200)
HDL: 64 mg/dL
LDL Cholesterol: 50 mg/dL (ref 0–99)
Total CHOL/HDL Ratio: 1.9 ratio
Triglycerides: 42 mg/dL
VLDL: 8 mg/dL (ref 0–40)

## 2024-03-27 LAB — MRSA NEXT GEN BY PCR, NASAL: MRSA by PCR Next Gen: NOT DETECTED

## 2024-03-27 LAB — HIV ANTIBODY (ROUTINE TESTING W REFLEX): HIV Screen 4th Generation wRfx: NONREACTIVE

## 2024-03-27 MED ORDER — MELATONIN 3 MG PO TABS: 3.0000 mg | ORAL_TABLET | Freq: Every evening | ORAL | Status: DC | PRN

## 2024-03-27 MED ORDER — GADOBUTROL 1 MMOL/ML IV SOLN
10.0000 mL | Freq: Once | INTRAVENOUS | Status: AC | PRN
  Administered 2024-03-27: 10 mL via INTRAVENOUS

## 2024-03-27 MED ORDER — CARVEDILOL 6.25 MG PO TABS
6.2500 mg | ORAL_TABLET | Freq: Two times a day (BID) | ORAL | Status: DC
  Administered 2024-03-28 (×2): 6.25 mg via ORAL
  Filled 2024-03-27: qty 2
  Filled 2024-03-27 (×2): qty 1

## 2024-03-27 MED ORDER — ASPIRIN 81 MG PO TBEC
81.0000 mg | DELAYED_RELEASE_TABLET | Freq: Every day | ORAL | Status: DC
  Administered 2024-03-27 – 2024-03-28 (×2): 81 mg via ORAL
  Filled 2024-03-27 (×2): qty 1

## 2024-03-27 MED ORDER — ENOXAPARIN SODIUM 40 MG/0.4ML IJ SOSY
40.0000 mg | PREFILLED_SYRINGE | INTRAMUSCULAR | Status: DC
  Administered 2024-03-27: 40 mg via SUBCUTANEOUS
  Filled 2024-03-27: qty 0.4

## 2024-03-27 MED ORDER — ROSUVASTATIN CALCIUM 20 MG PO TABS
20.0000 mg | ORAL_TABLET | Freq: Every day | ORAL | Status: DC
  Administered 2024-03-27 – 2024-03-28 (×2): 20 mg via ORAL
  Filled 2024-03-27 (×2): qty 1

## 2024-03-27 MED ORDER — PANTOPRAZOLE SODIUM 40 MG PO TBEC
40.0000 mg | DELAYED_RELEASE_TABLET | ORAL | Status: DC
  Administered 2024-03-28: 40 mg via ORAL
  Filled 2024-03-27: qty 1

## 2024-03-27 MED ORDER — SPIRONOLACTONE 25 MG PO TABS
25.0000 mg | ORAL_TABLET | Freq: Every day | ORAL | Status: DC
  Administered 2024-03-27 – 2024-03-28 (×2): 25 mg via ORAL
  Filled 2024-03-27 (×2): qty 1

## 2024-03-27 MED ORDER — FUROSEMIDE 20 MG PO TABS
20.0000 mg | ORAL_TABLET | Freq: Every day | ORAL | Status: DC
  Administered 2024-03-27 – 2024-03-28 (×2): 20 mg via ORAL
  Filled 2024-03-27 (×2): qty 1

## 2024-03-27 MED ORDER — LORATADINE 10 MG PO TABS
10.0000 mg | ORAL_TABLET | Freq: Every day | ORAL | Status: DC
  Administered 2024-03-27 – 2024-03-28 (×2): 10 mg via ORAL
  Filled 2024-03-27 (×2): qty 1

## 2024-03-27 MED ORDER — CHLORHEXIDINE GLUCONATE CLOTH 2 % EX PADS
6.0000 | MEDICATED_PAD | Freq: Every day | CUTANEOUS | Status: DC
  Administered 2024-03-27: 6 via TOPICAL

## 2024-03-27 NOTE — Progress Notes (Signed)
 STROKE TEAM PROGRESS NOTE   INTERIM HISTORY/SUBJECTIVE Seen in room with family at the bedside. Slight nasolabial fold flattening. No focal weakness.  Transfer out of ICU today. MRI shows a roughly 4 cm right temporo-occipital junction cortical and whitematter T2/FLAIR hyperintense lesion and recommend post contrast imaging. We will also obtain an EEG.   OBJECTIVE  CBC    Component Value Date/Time   WBC 5.2 03/26/2024 1100   RBC 4.26 03/26/2024 1100   HGB 11.9 (L) 03/26/2024 1106   HGB 12.7 02/24/2024 1038   HCT 35.0 (L) 03/26/2024 1106   PLT 246 03/26/2024 1100   PLT 278 02/24/2024 1038   MCV 89.4 03/26/2024 1100   MCH 29.3 03/26/2024 1100   MCHC 32.8 03/26/2024 1100   RDW 13.7 03/26/2024 1100   LYMPHSABS 1.3 03/26/2024 1100   MONOABS 0.6 03/26/2024 1100   EOSABS 0.6 (H) 03/26/2024 1100   BASOSABS 0.0 03/26/2024 1100    BMET    Component Value Date/Time   NA 141 03/26/2024 1106   K 4.5 03/26/2024 1106   CL 107 03/26/2024 1106   CO2 25 03/26/2024 1100   GLUCOSE 104 (H) 03/26/2024 1106   BUN 12 03/26/2024 1106   CREATININE 0.90 03/26/2024 1106   CREATININE 0.97 02/24/2024 1039   CALCIUM  10.0 03/26/2024 1100   CALCIUM  10.1 03/16/2024 1108   GFRNONAA >60 03/26/2024 1100   GFRNONAA >60 02/24/2024 1039    Vitals:   03/27/24 0400 03/27/24 0500 03/27/24 0600 03/27/24 0700  BP: (!) 127/42 (!) 112/58 (!) 141/55 (!) 156/55  Pulse: 65 67 (!) 58 64  Resp: (!) 22 19 19 18   Temp: 99.5 F (37.5 C)     TempSrc: Oral     SpO2: 100% 100% 100% 100%  Weight:         PHYSICAL EXAM General:  Alert, well-nourished, well-developed patient in no acute distress Psych:  Mood and affect appropriate for situation CV: Regular rate and rhythm on monitor Respiratory:  Regular, unlabored respirations on room air GI: Abdomen soft and nontender   NEURO:  Mental Status: AA&Ox3, patient is able to give clear and coherent history Speech/Language: speech is without dysarthria or aphasia.   Naming, repetition, fluency, and comprehension intact.  Cranial Nerves:  II: PERRL. Visual fields full.  III, IV, VI: EOMI. Eyelids elevate symmetrically.  V: Sensation is intact to light touch and symmetrical to face.  VII: Slight let nasolabial fold flattening  VIII: hearing intact to voice. IX, X: Palate elevates symmetrically. Phonation is normal.  KP:Dynloizm shrug 5/5. XII: tongue is midline without fasciculations. Motor: 5/5 strength to all muscle groups tested.  Tone: is normal and bulk is normal Sensation- Intact to light touch bilaterally. Extinction absent to light touch to DSS.   Coordination: FTN intact bilaterally, HKS: no ataxia in BLE.No drift.  Gait- deferred  Most Recent NIH 0    ASSESSMENT/PLAN  Paige Hester is a 69 y.o. female with history of hypertension, prior pulmonary embolism due to prior breast cancer presents to the emergency department with acute onset right-sided numbness, weakness, and slurred speech .  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 ASPECTS 10.    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 Follow up postcontrast Brain MRI recommended,  but assuming no enhancement the constellation of recent imaging (including CT Perfusion) findings favors a low grade Glioma. Unusual PRES, post-ictal changes, and encephalitis were all considered but are felt unlikely. Negative for acute infarct or intracranial hemorrhage. Moderate for age chronic cerebral white matter changes. MRI w contrast- pending EEG- pending  2D Echo EF 60-65% LDL 50 HgbA1c 6.2 VTE prophylaxis - Lovenox  aspirin  81 mg daily prior to admission, now on aspirin  81 mg daily Therapy recommendations:  Outpatient PT Disposition:  pending    Hypertension Home meds:, Lasix  spironolactone  Stable Blood Pressure Goal: BP less than 180/105   Hyperlipidemia Home meds:  crestor  20mg , resumed in hospital LDL 50, goal < 70 Continue statin at discharge  Dysphagia Patient has post-stroke dysphagia, SLP consulted    Diet   Diet regular Room service appropriate? Yes; Fluid consistency: Thin   Advance diet as tolerated   Hospital day # 1  Patient seen and examined by NP/APP with MD. MD to update note as needed.   Jorene Last, DNP, FNP-BC [facility] Pager: [phone]   To contact Stroke Continuity provider, please refer to [email or web address]. After hours, contact General Neurology

## 2024-03-27 NOTE — Progress Notes (Signed)
 OT Cancellation Note  Patient Details Name: Paige Hester MRN: [id number] DOB: 01-Jul-1955   Cancelled Treatment:    Reason Eval/Treat Not Completed: OT screened, no needs identified, will sign off (per PT, pt independent, passed vision and cognitive screen with strength WFL. pt and husband confirm back to baseline.)  Josimar Corning D Walton, OTD, OTR/L [facility] Acute Rehabilitation Office: [phone]   Elma JONETTA Penner 03/27/2024, 2:45 PM

## 2024-03-27 NOTE — Progress Notes (Signed)
 EEG complete - results pending

## 2024-03-27 NOTE — Procedures (Signed)
 Patient Name: Paige Hester  MRN: [id number]  Epilepsy Attending: Arlin MALVA Krebs  Referring Physician/Provider: Remi Pippin, NP  Date: 03/27/2024 Duration: 22.59 mins  Patient history: 69 yo M with acute onset right-sided numbness, weakness, and slurred speech. EEG  to evaluate for seizure  Level of alertness: Awake, asleep  AEDs during EEG study: None  Technical aspects: This EEG study was done with scalp electrodes positioned according to the 10-20 International system of electrode placement. Electrical activity was reviewed with band pass filter of 1-70Hz , sensitivity of 7 uV/mm, display speed of 24mm/sec with a 60Hz  notched filter applied as appropriate. EEG data were recorded continuously and digitally stored.  Video monitoring was available and reviewed as appropriate.  Description: The posterior dominant rhythm consists of 9-10 Hz activity of moderate voltage (25-35 uV) seen predominantly in posterior head regions, symmetric and reactive to eye opening and eye closing. Sleep was characterized by vertex waves, sleep spindles (12 to 14 Hz), maximal frontocentral region.  Hyperventilation and photic stimulation were not performed.     IMPRESSION: This study is within normal limits. No seizures or epileptiform discharges were seen throughout the recording.  A normal interictal EEG does not exclude the diagnosis of epilepsy.  Vernella Niznik O Cheray Pardi

## 2024-03-27 NOTE — Progress Notes (Signed)
 SLP Cancellation Note  Patient Details Name: Paige Hester MRN: [id number] DOB: 05/03/1955   Cancelled treatment:       Reason Eval/Treat Not Completed: SLP screened, no needs identified, will sign off. Patient acknowledges slurring of speech on admission but that her speech is back to normal. She denies any cognitive or language difficulties or changes and feels 100% back to normal. SLP did not observe any speech, language or cognitive impairment during this brief screen.   Paige IVAR Blase, MA, CCC-SLP Speech Therapy  03/27/2024, 3:27 PM

## 2024-03-27 NOTE — Evaluation (Signed)
 Physical Therapy Evaluation & Discharge Patient Details Name: Paige Hester MRN: [id number] DOB: 1955/04/04 Today's Date: 03/27/2024  History of Present Illness  Pt is a 69 y.o. female who presented 03/26/24 with R-sided numbness, weakness, and slurred speech. Pt received TNK. MRI negative for acute infarct or intracranial hemorrhage but did display a roughly 4 cm right temporo-occipital junction cortical and whitematter T2/FLAIR hyperintense lesion, findings favor a low grade Glioma. PMH: anxiety, breast cancer, COPD, GERD, HLD, HTN, anemia, heart murmur, prediabetes, PE, sleep apnea   Clinical Impression  Pt presents with condition above. At baseline, pt is independent without DME and lives alone in a 1-level house with 3 STE. The pt reports she is back to her baseline, demonstrating WFL, symmetrical, and intact bil upper and lower extremity strength, sensation, and coordination. Her vision also appeared intact with testing, and she was A&Ox4. She was able to count backwards by 3s, name animals that start with different letters of the alphabet, pathfind her way back to her room, and dual task without issue or mistake, indicating her cognition is likely intact. She scored a 22/24 on the DGI, also suggesting she is not at high risk for falls. She is mobilizing independently without DME or LOB, even with dynamic gait challenges. Educated pt on BE FAST. All education complete and questions answered. PT will sign off since pt is back to her baseline. She would like to continue OPPT to manage her chronic back pain.        If plan is discharge home, recommend the following:  (N/A)   Can travel by private vehicle        Equipment Recommendations None recommended by PT  Recommendations for Other Services       Functional Status Assessment Patient has not had a recent decline in their functional status     Precautions / Restrictions Precautions Precautions: None Restrictions Weight  Bearing Restrictions Per Provider Order: No      Mobility  Bed Mobility               General bed mobility comments: Pt sitting in recliner upon arrival and at end of session.    Transfers Overall transfer level: Independent Equipment used: None               General transfer comment: Pt able to stand from chair without LOB or assistance    Ambulation/Gait Ambulation/Gait assistance: Independent Gait Distance (Feet): 180 Feet Assistive device: None Gait Pattern/deviations: Step-through pattern, Decreased stride length, Trunk flexed, Wide base of support Gait velocity: functional Gait velocity interpretation: >2.62 ft/sec, indicative of community ambulatory   General Gait Details: Pt with intermittent slowed gait pattern and increased trunk anterior and lateral flexion when back pain is present. No LOB though, even with dual tasking and dynamic gait challenges. Slows gait to step over obstacles.  Stairs Stairs: Yes Stairs assistance: Contact guard assist Stair Management: No rails, Alternating pattern, Step to pattern, Forwards Number of Stairs: 3 General stair comments: Ascends with alternating step pattern, descends with step-to pattern. No LOB, CGA for safety  Wheelchair Mobility     Tilt Bed    Modified Rankin (Stroke Patients Only) Modified Rankin (Stroke Patients Only) Pre-Morbid Rankin Score: No symptoms Modified Rankin: No symptoms     Balance Overall balance assessment: Mild deficits observed, not formally tested  Standardized Balance Assessment Standardized Balance Assessment : Dynamic Gait Index   Dynamic Gait Index Level Surface: Normal Change in Gait Speed: Normal Gait with Horizontal Head Turns: Normal Gait with Vertical Head Turns: Normal Gait and Pivot Turn: Normal Step Over Obstacle: Mild Impairment Step Around Obstacles: Normal Steps: Mild Impairment Total Score: 22        Pertinent Vitals/Pain Pain Assessment Pain Assessment: Faces Faces Pain Scale: Hurts a little bit Pain Location: chronic back pain Pain Descriptors / Indicators: Discomfort, Grimacing, Guarding Pain Intervention(s): Limited activity within patient's tolerance, Monitored during session, Repositioned    Home Living Family/patient expects to be discharged to:: Private residence Living Arrangements: Alone Available Help at Discharge: Available PRN/intermittently;Family;Friend(s) (sister works and lives next door but can get friends from church.) Type of Home: House Home Access: Stairs to enter Entrance Stairs-Rails: None Secretary/administrator of Steps: 3   Home Layout: One level Home Equipment: BSC/3in1;Toilet riser;Grab bars - tub/shower Additional Comments: Pt states if needed, they can get sister to help when not working. Pt states if sister unavailable, pt can get friends from church to help.    Prior Function Prior Level of Function : Independent/Modified Independent;Driving             Mobility Comments: No AD       Extremity/Trunk Assessment   Upper Extremity Assessment Upper Extremity Assessment: Overall WFL for tasks assessed;Right hand dominant (sensation, coordination, dynamic proprioception, and strength (grossly 5/5) all intact and symmetrical bil)    Lower Extremity Assessment Lower Extremity Assessment: Overall WFL for tasks assessed (sensation, coordination, dynamic proprioception, and strength (grossly 5/5) all intact and symmetrical bil)    Cervical / Trunk Assessment Cervical / Trunk Assessment: Normal  Communication   Communication Communication: No apparent difficulties    Cognition Arousal: Alert Behavior During Therapy: WFL for tasks assessed/performed   PT - Cognitive impairments: No apparent impairments                       PT - Cognition Comments: A&Ox4. Pt able to dual task cog and motor tasks without issue, mistake, or  LOB. Pt able to name animals that start with different letters of the alphabet without mistake. Pt able to count backwards by 3s accurately. Recalls her MD appointment coming up and recent prior events. Able to pathfind her way back to her room Following commands: Intact       Cueing Cueing Techniques: Verbal cues     General Comments General comments (skin integrity, edema, etc.): Educated pt and son on BE FAST.    Exercises     Assessment/Plan    PT Assessment All further PT needs can be met in the next venue of care  PT Problem List Decreased balance;Decreased mobility;Pain       PT Treatment Interventions      PT Goals (Current goals can be found in the Care Plan section)  Acute Rehab PT Goals Patient Stated Goal: to resume OPPT for her back pain PT Goal Formulation: All assessment and education complete, DC therapy Time For Goal Achievement: 03/28/24 Potential to Achieve Goals: Good    Frequency       Co-evaluation               AM-PAC PT 6 Clicks Mobility  Outcome Measure Help needed turning from your back to your side while in a flat bed without using bedrails?: None Help needed moving from lying on your back to sitting on the  side of a flat bed without using bedrails?: None Help needed moving to and from a bed to a chair (including a wheelchair)?: None Help needed standing up from a chair using your arms (e.g., wheelchair or bedside chair)?: None Help needed to walk in hospital room?: None Help needed climbing 3-5 steps with a railing? : A Little 6 Click Score: 23    End of Session   Activity Tolerance: Patient tolerated treatment well Patient left: in chair;with call bell/phone within reach;with family/visitor present Nurse Communication: Mobility status PT Visit Diagnosis: Unsteadiness on feet (R26.81);Other abnormalities of gait and mobility (R26.89);Other symptoms and signs involving the nervous system (R29.898)    Time: 8752-8688 PT Time  Calculation (min) (ACUTE ONLY): 24 min   Charges:   PT Evaluation $PT Eval Low Complexity: 1 Low PT Treatments $Therapeutic Activity: 8-22 mins PT General Charges $$ ACUTE PT VISIT: 1 Visit         Theo Ferretti, PT, DPT Acute Rehabilitation Services  Office: [phone]   Theo CHRISTELLA Ferretti 03/27/2024, 1:31 PM

## 2024-03-27 NOTE — Progress Notes (Unsigned)
 Cardiology Office Note  Date:  03/27/2024   ID:  Paige Hester, DOB 23-Oct-1955, MRN [id number]  PCP:  Gretta Comer POUR, NP   No chief complaint on file.   HPI:  Paige Hester is a 69 year old woman with past medical history of Abdominal pain Prior smoker, quit 1999 (smoked 37 years) Acute PE April 2022 moderate aortic atheroma in the arch, mild distally, at least moderate coronary calcification noted on CT scan Presenting for f/u of his pulmonary embolism, PAD, aortic atheroma  Last seen by myself in clinic 2/24 Seen by one of our providers last April 2025   Recent travel to [place]  to see grandchildren Took Eliquis  2.5 prophylactic dosing for DVT prevention Now back from her travels, has a sore neck on the right Thinks it could have been aggravated by lifting her grandchild  Denies significant chest pain Blood pressure elevated in the office which she feels is from neck pain, going to the wrong building, rushing this morning No improvement on recheck Took her medications this morning, amlodipine  10 with losartan  HCTZ 100/25  Takes 2 over-the-counter potassium daily  Denies chest pain or shortness of breath concerning for angina  Lab work reviewed A1c 6.2 Total cholesterol 123 LDL 58  EKG personally reviewed by myself on todays visit Shows normal sinus rhythm rate 62 bpm no significant ST or T wave changes  Other past medical history reviewed Traveling back and forth to [place] , car and train SOB sx in Jan 2022 Swelling feb and march Seen in the hospital April 2022  intermittent lower extremity swelling and dyspnea on exertion, worse the past several days after a long car drive to [place] .    CTA shows small subsegmental PE w/o evidence right heart strain.  Monitored overnight  tentative plan for minimum of 3 months anticoagulation.  CT scan chest July 11, 2020, 1. Positive for isolated subsegmental pulmonary embolus in the left upper lobe. 2.  Prominence of the main pulmonary artery suggesting pulmonary arterial hypertension. 3. Cardiomegaly with coronary artery calcifications. 4. Mild heterogeneous pulmonary parenchyma, can be seen with small airways disease. 5. Incidental note of gallstones.  Echocardiogram April 2022  1. Left ventricular ejection fraction, by estimation, is 60 to 65%. The  left ventricle has normal function. The left ventricle has no regional  wall motion abnormalities. Left ventricular diastolic parameters are  consistent with Grade II diastolic  dysfunction (pseudonormalization). The average left ventricular global  longitudinal strain is -15.5 %.   2. Right ventricular systolic function is normal. The right ventricular  size is normal.   3. Left atrial size was mildly dilated.    PMH:   has a past medical history of Allergy, Anxiety (08/14/18), Bilateral swelling of feet and ankles, Breast cancer (HCC) (03/20/2023), COPD (chronic obstructive pulmonary disease) (HCC), Family history of colon cancer, Family history of prostate cancer, Gallbladder polyp (2012), GERD (gastroesophageal reflux disease), Hyperlipidemia, Hypertension, Iron  deficiency anemia, Menopausal symptoms, Murmur, cardiac, Other fatigue, Prediabetes, Pulmonary embolism (HCC) (06/2020), Shortness of breath, Shortness of breath on exertion, Sleep apnea, and Vitamin D  deficiency.  PSH:    Past Surgical History:  Procedure Laterality Date   BREAST BIOPSY Left 03/17/2023   US  bx,Ribbon Clip, path pending   BREAST BIOPSY Left 03/17/2023   US  Bx, Coil Clip,path pending   BREAST BIOPSY Left 03/17/2023   US  LT BREAST BX W LOC DEV 1ST LESION IMG BX SPEC US  GUIDE 03/17/2023 [facility]   BREAST BIOPSY Left 03/17/2023   US  LT BREAST  BX W LOC DEV EA ADD LESION IMG BX SPEC US  GUIDE 03/17/2023 [facility]   COLONOSCOPY  2010   2020   HYSTEROSCOPY WITH D & C N/A 07/27/2019   Procedure: DILATATION AND CURETTAGE /HYSTEROSCOPY, Polypectomy;   Surgeon: Herchel Gloris LABOR, MD;  Location: [facility];  Service: Gynecology;  Laterality: N/A;   HYSTEROSCOPY WITH D & C N/A 06/26/2021   Procedure: DILATATION AND CURETTAGE /HYSTEROSCOPY;  Surgeon: Herchel Gloris LABOR, MD;  Location: [facility];  Service: Gynecology;  Laterality: N/A;   INTRAUTERINE DEVICE (IUD) INSERTION N/A 06/26/2021   Procedure: INTRAUTERINE DEVICE (IUD) INSERTION;  Surgeon: Herchel Gloris LABOR, MD;  Location: [facility];  Service: Gynecology;  Laterality: N/A;    No current facility-administered medications for this visit.   No current outpatient medications on file.   Facility-Administered Medications Ordered in Other Visits  Medication Dose Route Frequency Provider Last Rate Last Admin    stroke: early stages of recovery book   Does not apply Once Remi Pippin, NP       acetaminophen  (TYLENOL ) tablet 650 mg  650 mg Oral Q4H PRN Remi Pippin, NP   650 mg at 03/27/24 0139   Or   acetaminophen  (TYLENOL ) 160 MG/5ML solution 650 mg  650 mg Per Tube Q4H PRN Remi Pippin, NP       Or   acetaminophen  (TYLENOL ) suppository 650 mg  650 mg Rectal Q4H PRN Remi Pippin, NP       carvedilol  (COREG ) tablet 6.25 mg  6.25 mg Oral BID WC Remi Pippin, NP       Chlorhexidine  Gluconate Cloth 2 % PADS 6 each  6 each Topical Daily Delores Sarajane ORN, MD       furosemide  (LASIX ) tablet 20 mg  20 mg Oral Daily Remi Pippin, NP   20 mg at 03/27/24 1036   labetalol  (NORMODYNE ) injection 10 mg  10 mg Intravenous Q2H PRN Remi Pippin, NP       loratadine  (CLARITIN ) tablet 10 mg  10 mg Oral Daily Remi Pippin, NP   10 mg at 03/27/24 1035   melatonin tablet 3 mg  3 mg Oral QHS PRN Remi Pippin, NP       NOREEN ON 03/28/2024] pantoprazole  (PROTONIX ) EC tablet 40 mg  40 mg Oral QODAY Shafer, Pippin, NP       rosuvastatin  (CRESTOR ) tablet 20 mg  20 mg Oral Daily Remi Pippin, NP   20 mg at 03/27/24 1035   senna-docusate (Senokot-S) tablet 1 tablet  1  tablet Oral QHS PRN Remi Pippin, NP       sodium chloride  flush (NS) 0.9 % injection 3 mL  3 mL Intravenous Once Armenta Canning, MD       spironolactone  (ALDACTONE ) tablet 25 mg  25 mg Oral Daily Remi Pippin, NP   25 mg at 03/27/24 1036    Allergies:   Patient has no known allergies.   Social History:  The patient  reports that she quit smoking about 26 years ago. Her smoking use included cigarettes. She started smoking about 54 years ago. She has a 27 pack-year smoking history. She has never used smokeless tobacco. She reports current alcohol use of about 7.0 standard drinks of alcohol per week. She reports that she does not use drugs.   Family History:   family history includes Cancer in her father; Colon cancer in her maternal aunt; Coronary artery disease in her sister; Coronary artery disease (age of onset: 81) in  her sister; Depression in her mother and another family member; Early death in her brother and mother; Heart attack (age of onset: 70) in her mother; Heart disease in her brother, brother, mother, sister, and sister; Heart disease (age of onset: 59) in her father; Hypertension in her mother, sister, and sister; Miscarriages / Stillbirths in her mother; Obesity in her sister, sister, sister, and sister; Prostate cancer in her father; Stroke in her mother; Sudden death in her mother and another family member.    Review of Systems: Review of Systems  Constitutional: Negative.   HENT: Negative.    Respiratory: Negative.    Cardiovascular:  Positive for leg swelling.  Gastrointestinal: Negative.   Musculoskeletal: Negative.   Neurological: Negative.   Psychiatric/Behavioral: Negative.    All other systems reviewed and are negative.   PHYSICAL EXAM: VS:  There were no vitals taken for this visit. , BMI There is no height or weight on file to calculate BMI. Constitutional:  oriented to person, place, and time. No distress.  HENT:  Head: Grossly normal Eyes:  no discharge.  No scleral icterus.  Neck: No JVD, no carotid bruits  Cardiovascular: Regular rate and rhythm, 2/6 systolic ejection murmur right sternal border radiating into carotids  Pulmonary/Chest: Clear to auscultation bilaterally, no wheezes or rails Abdominal: Soft.  no distension.  no tenderness.  Musculoskeletal: Normal range of motion Neurological:  normal muscle tone. Coordination normal. No atrophy Skin: Skin warm and dry Psychiatric: normal affect, pleasant  Recent Labs: 03/26/2024: ALT 12; BUN 12; Creatinine, Ser 0.90; Hemoglobin 11.9; Platelets 246; Potassium 4.5; Sodium 141    Lipid Panel Lab Results  Component Value Date   CHOL 122 03/27/2024   HDL 64 03/27/2024   LDLCALC 50 03/27/2024   TRIG 42 03/27/2024    Wt Readings from Last 3 Encounters:  03/26/24 249 lb 9 oz (113.2 kg)  03/18/24 250 lb 4 oz (113.5 kg)  03/16/24 244 lb (110.7 kg)     ASSESSMENT AND PLAN:  Problem List Items Addressed This Visit   None  Pulmonary embolism January and February 2022 after car trip, plane rides with DVT and PE prophylactic treatment with compression hose, dosing with Eliquis  2.5 twice daily as needed for long car trips or plane rides Samples of Eliquis  provided  Essential hypertension Blood pressure elevated on today's visit, previously well-controlled No changes made to her medications but recommended she monitor blood pressure closely at home and call us  with numbers if it continues to run high  Carotid/subclavian disease, bruit appreciated last done in 6/22 Mild plaque bilaterally less than 50% range No significant change  Aortic atherosclerosis At least moderate aortic atheroma in the arch, mild distally, at least moderate coronary calcification noted Cholesterol at goal  Aortic valve sclerosis/murmur Murmur appreciated on exam, radiating to carotids    Total encounter time more than 30 minutes  Greater than 50% was spent in counseling and coordination of care with the  patient    Signed, Velinda Lunger, M.D., Ph.D. [facility] [place], [place] [phone]

## 2024-03-28 ENCOUNTER — Ambulatory Visit: Admitting: Cardiovascular Disease

## 2024-03-28 ENCOUNTER — Encounter (HOSPITAL_COMMUNITY)
Admission: EM | Disposition: A | Discharge status: Home / Self Care | Payer: Self-pay | Source: Home / Self Care | Attending: Neurology

## 2024-03-28 ENCOUNTER — Encounter (HOSPITAL_COMMUNITY): Payer: Self-pay | Admitting: Student

## 2024-03-28 ENCOUNTER — Other Ambulatory Visit (HOSPITAL_COMMUNITY): Payer: Self-pay

## 2024-03-28 ENCOUNTER — Other Ambulatory Visit: Payer: Self-pay | Admitting: Primary Care

## 2024-03-28 DIAGNOSIS — I639 Cerebral infarction, unspecified: Secondary | ICD-10-CM

## 2024-03-28 DIAGNOSIS — I517 Cardiomegaly: Secondary | ICD-10-CM

## 2024-03-28 DIAGNOSIS — I10 Essential (primary) hypertension: Secondary | ICD-10-CM

## 2024-03-28 DIAGNOSIS — I272 Pulmonary hypertension, unspecified: Secondary | ICD-10-CM

## 2024-03-28 DIAGNOSIS — I6523 Occlusion and stenosis of bilateral carotid arteries: Secondary | ICD-10-CM

## 2024-03-28 DIAGNOSIS — I700 Atherosclerosis of aorta: Secondary | ICD-10-CM

## 2024-03-28 DIAGNOSIS — G458 Other transient cerebral ischemic attacks and related syndromes: Secondary | ICD-10-CM

## 2024-03-28 DIAGNOSIS — E782 Mixed hyperlipidemia: Secondary | ICD-10-CM

## 2024-03-28 DIAGNOSIS — Z86711 Personal history of pulmonary embolism: Secondary | ICD-10-CM

## 2024-03-28 DIAGNOSIS — I739 Peripheral vascular disease, unspecified: Secondary | ICD-10-CM

## 2024-03-28 HISTORY — PX: LOOP RECORDER INSERTION: EP1214

## 2024-03-28 LAB — GLUCOSE, CAPILLARY: Glucose-Capillary: 245 mg/dL — ABNORMAL HIGH (ref 70–99)

## 2024-03-28 MED ORDER — PANTOPRAZOLE SODIUM 40 MG PO TBEC: 40.0000 mg | DELAYED_RELEASE_TABLET | ORAL | 0 refills | Status: AC

## 2024-03-28 MED ORDER — LIDOCAINE-EPINEPHRINE 1 %-1:100000 IJ SOLN
INTRAMUSCULAR | Status: DC | PRN
  Administered 2024-03-28: 5 mL via INTRADERMAL

## 2024-03-28 MED ORDER — CLOPIDOGREL BISULFATE 75 MG PO TABS
75.0000 mg | ORAL_TABLET | Freq: Every day | ORAL | Status: DC
  Administered 2024-03-28: 75 mg via ORAL
  Filled 2024-03-28: qty 1

## 2024-03-28 MED ORDER — CLOPIDOGREL BISULFATE 75 MG PO TABS: 75.0000 mg | ORAL_TABLET | Freq: Every day | ORAL | 2 refills | Status: AC

## 2024-03-28 MED ORDER — ASPIRIN 81 MG PO TBEC: 81.0000 mg | DELAYED_RELEASE_TABLET | Freq: Every day | ORAL | 2 refills | Status: AC

## 2024-03-28 MED ORDER — LIDOCAINE-EPINEPHRINE 1 %-1:100000 IJ SOLN
INTRAMUSCULAR | Status: AC
  Filled 2024-03-28: qty 1

## 2024-03-28 NOTE — Consult Note (Signed)
 "    ELECTROPHYSIOLOGY CONSULT NOTE  Patient ID: Paige Hester MRN: [id number], DOB/AGE: [birth year]   Admit date: 03/26/2024 Date of Consult: 03/28/2024  Primary Physician: Gretta Comer POUR, NP Primary Cardiologist: Evalene Lunger, MD  Primary Electrophysiologist: New to Dr. Almetta Reason for Consultation: Cryptogenic stroke; recommendations regarding Implantable Loop Recorder Insurance: Medicare  History of Present Illness EP has been asked to evaluate [name] for placement of an implantable loop recorder to monitor for atrial fibrillation by Dr Rosemarie.  The patient was admitted on 03/26/2024 with acute onset right-sided numbness, weakness, and slurred speech .    The pt has undergone workup for stroke including:  Code Stroke CT head No acute abnormality. ASPECTS 10.    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 Follow up postcontrast Brain MRI recommended, but assuming no enhancement the constellation of recent imaging (including CT Perfusion) findings favors a low grade Glioma. Unusual PRES, post-ictal changes, and encephalitis were all considered but are felt unlikely. Negative for acute infarct or intracranial hemorrhage. Moderate for age chronic cerebral white matter changes. MRI w contrast 1. No abnormal contrast enhancement of the right temporal lesion or elsewhere in the brain. 2. Primary differential considerations include low-grade glial neoplasm or nonspecific encephalitis. EEG- normal  2D Echo EF 60-65% LDL 50 HgbA1c 6.2 VTE prophylaxis - Lovenox  aspirin  81 mg daily prior to admission, now on aspirin  81 mg daily Therapy recommendations:  Outpatient PT Disposition:  Home today   The patient has been monitored on telemetry which has demonstrated sinus rhythm with no arrhythmias.   Inpatient stroke work-up will not require a TEE per Neurology.   Echocardiogram as above. Lab work is reviewed.  Prior to admission, the patient denies chest pain, shortness of breath, dizziness, palpitations, or syncope.  She is recovering from her stroke with plans to return home  at discharge.  Allergies, Past Medical, Surgical, Social, and Family Histories have been reviewed and are referenced here-in when relevant for medical decision making.   Inpatient Medications:   aspirin  EC  81 mg Oral Daily   carvedilol   6.25 mg Oral BID WC   Chlorhexidine  Gluconate Cloth  6 each Topical Daily   enoxaparin  (LOVENOX ) injection  40 mg Subcutaneous Q24H   furosemide   20 mg Oral Daily   loratadine   10 mg Oral Daily   pantoprazole   40 mg Oral QODAY   rosuvastatin   20 mg Oral Daily   sodium chloride  flush  3 mL Intravenous Once   spironolactone   25 mg Oral Daily    Physical Exam: Vitals:   03/28/24 0320 03/28/24 0533 03/28/24 0750 03/28/24 1205  BP: (!) 131/37 (!) 142/49 116/62 (!) 155/71  Pulse: 70 64 62 66  Resp:   18 18  Temp: 98.4 F (36.9 C)  97.8 F (36.6 C) 97.9 F (36.6 C)  TempSrc: Oral  Oral Oral  SpO2: 98%  99% 100%  Weight:        GEN- NAD. A&O x 3. Normal affect. HEENT: Normocephalic, atraumatic Lungs- CTAB, Normal effort.  Heart- Regular rate and rhythm rate and rhythm. No M/G/R.  Extremities- No peripheral edema. no clubbing or cyanosis Skin- warm and dry, no rash or lesion. Neuro - No persistent deficits   12-lead ECG on arrival 03/26/2024 shows NSR (personally reviewed) All prior EKG's in EPIC reviewed with no  documented atrial fibrillation  Telemetry - NSR, occasional PVCs (personally reviewed)  Assessment and Plan:  1. Cryptogenic stroke The patient presents with cryptogenic stroke.  The patient does not have a TEE planned for this AM.  I spoke at length with the patient about monitoring for afib with an implantable loop recorder, including monthly monitor  fees which may range from $0-$40.  Risks, benefits, and alteratives to implantable loop recorder were discussed with the patient today.  At this time, the patient is very clear in their decision to proceed with implantable loop recorder.    Wound care was reviewed with the patient (keep incision clean and dry for 3 days). Please call with questions.   Ozell Prentice Passey, PA-C 03/28/2024 1:01 PM   "

## 2024-03-28 NOTE — Discharge Summary (Addendum)
 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  Patient Active Problem List   Diagnosis Date Noted   Stroke (HCC) 03/26/2024   Chronic bilateral low back pain 03/03/2024   Hypercalcemia 02/24/2024   Rash and nonspecific skin eruption 10/09/2023   Acute back pain 10/09/2023   Osteopenia 07/01/2023   Mirena  IUD (intrauterine device) in place since 06/26/2021 06/23/2023   Preoperative clearance 05/11/2023   Genetic testing 04/03/2023   Family history of prostate cancer    Family history of colon cancer    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   Family history of cancer 03/26/2023   Goals of care, counseling/discussion 03/26/2023   History of pediculosis 03/26/2023   Bilateral carotid artery stenosis 11/18/2022   Carpal tunnel syndrome of right wrist 01/31/2022   Bilateral hand pain 01/31/2022   Thickened endometrium    OSA (obstructive sleep apnea) 02/12/2021   Pulmonary hypertension (HCC) 07/19/2020   Aortic atherosclerosis 07/19/2020   Hyperlipidemia 07/19/2020   History of pulmonary embolism 07/11/2020   Cardiomegaly 07/11/2020   Endometrial thickness of 18 mm and associated bleeding in postmenopausal patient 07/27/2019   Mild dysplasia of cervix (CIN I) 10/12/2018   Prediabetes 11/04/2016   Preventative health care 08/30/2014   Morbid obesity (HCC) 08/10/2013   GERD (gastroesophageal reflux disease) 05/31/2012   Hypokalemia 01/17/2011   MURMUR, CARDIAC, UNDIAGNOSED 09/20/2008   Iron  deficiency anemia 08/28/2008    Essential hypertension 12/19/2006     Allergies as of 03/28/2024   No Known Allergies      Medication List     STOP taking these medications    omeprazole  20 MG capsule Commonly known as: PRILOSEC       TAKE these medications    albuterol  108 (90 Base) MCG/ACT inhaler Commonly known as: VENTOLIN  HFA Inhale 2 puffs into the lungs every 6 (six) hours as needed for wheezing or shortness of breath.   anastrozole  1 MG tablet Commonly known as: ARIMIDEX  Take 1 tablet (1 mg total) by mouth daily.   aspirin  EC 81 MG tablet Take 1 tablet (81 mg total) by mouth daily. Swallow whole.   carvedilol  6.25 MG tablet Commonly known as: COREG  Take 1 tablet (6.25 mg total) by mouth 2 (two) times daily with a meal. for blood pressure.   cholecalciferol 25 MCG (1000 UNIT) tablet Commonly known as: VITAMIN D3 Take 1,000 Units by mouth daily.   clopidogrel  75 MG tablet Commonly known as: PLAVIX  Take 1 tablet (75 mg total) by mouth daily.   cyclobenzaprine  10 MG tablet Commonly known as: FLEXERIL  Take 1 tablet (10 mg total) by mouth at bedtime. For back pain What changed:  when to take this reasons to take this   fluticasone  50 MCG/ACT nasal spray Commonly known as: FLONASE  Place 1 spray into both nostrils 2 (two) times daily as needed for allergies or rhinitis.   furosemide  20 MG tablet Commonly known as: LASIX  Take 20 mg by mouth daily.   Iron -Vitamin C  65-125 MG Tabs Take 1 tablet by mouth  daily.   loratadine  10 MG tablet Commonly known as: CLARITIN  Take 10 mg by mouth daily.   Melatonin 10 MG Tabs Take 10 mg by mouth at bedtime.   oyster calcium  500 MG Tabs tablet Take 600 mg of elemental calcium  by mouth 2 (two) times daily.   pantoprazole  40 MG tablet Commonly known as: PROTONIX  Take 1 tablet (40 mg total) by mouth every other day. Start taking on: March 30, 2024   rosuvastatin  20 MG tablet Commonly known as: CRESTOR  Take 1 tablet (20 mg total) by mouth  daily.   spironolactone  25 MG tablet Commonly known as: ALDACTONE  Take 25 mg by mouth daily.        LABORATORY STUDIES CBC    Component Value Date/Time   WBC 5.2 03/26/2024 1100   RBC 4.26 03/26/2024 1100   HGB 11.9 (L) 03/26/2024 1106   HGB 12.7 02/24/2024 1038   HCT 35.0 (L) 03/26/2024 1106   PLT 246 03/26/2024 1100   PLT 278 02/24/2024 1038   MCV 89.4 03/26/2024 1100   MCH 29.3 03/26/2024 1100   MCHC 32.8 03/26/2024 1100   RDW 13.7 03/26/2024 1100   LYMPHSABS 1.3 03/26/2024 1100   MONOABS 0.6 03/26/2024 1100   EOSABS 0.6 (H) 03/26/2024 1100   BASOSABS 0.0 03/26/2024 1100   CMP    Component Value Date/Time   NA 141 03/26/2024 1106   K 4.5 03/26/2024 1106   CL 107 03/26/2024 1106   CO2 25 03/26/2024 1100   GLUCOSE 104 (H) 03/26/2024 1106   BUN 12 03/26/2024 1106   CREATININE 0.90 03/26/2024 1106   CREATININE 0.97 02/24/2024 1039   CALCIUM  10.0 03/26/2024 1100   CALCIUM  10.1 03/16/2024 1108   PROT 6.9 03/26/2024 1100   ALBUMIN 3.9 03/26/2024 1100   AST 22 03/26/2024 1100   AST 21 02/24/2024 1039   ALT 12 03/26/2024 1100   ALT 11 02/24/2024 1039   ALKPHOS 79 03/26/2024 1100   BILITOT 0.4 03/26/2024 1100   BILITOT 0.7 02/24/2024 1039   GFRNONAA >60 03/26/2024 1100   GFRNONAA >60 02/24/2024 1039   GFRAA >60 07/25/2019 1500   COAGS Lab Results  Component Value Date   INR 0.9 03/26/2024   INR 1.0 07/11/2020   Lipid Panel    Component Value Date/Time   CHOL 122 03/27/2024 0558   CHOL 130 01/22/2021 0945   TRIG 42 03/27/2024 0558   HDL 64 03/27/2024 0558   HDL 61 01/22/2021 0945   CHOLHDL 1.9 03/27/2024 0558   VLDL 8 03/27/2024 0558   LDLCALC 50 03/27/2024 0558   LDLCALC 58 01/22/2021 0945   HgbA1C  Lab Results  Component Value Date   HGBA1C 6.2 10/09/2023   Alcohol Level    Component Value Date/Time   ETH <15 03/26/2024 1100     SIGNIFICANT DIAGNOSTIC STUDIES  EXAM: CT HEAD WITHOUT CONTRAST 03/26/2024 11:10:58 AM  IMPRESSION: 1.  No acute intracranial abnormality.   ASPECTS 10. 2. These results were communicated to Dr. Nichola at 1123 hours on 03/26/2024 by text page via the [name] messaging system.  EXAM: CTA Head and Neck with Perfusion 03/26/2024 11:37:31 AM  These results were communicated to Dr. LOIS Daring at 11:54 PM on 03/26/2024 by secure text page via the [facility] messaging system.   IMPRESSION: 1. No large vessel occlusion. 2. No evidence of an acute core infarct or penumbra on perfusion imaging. 3. Intracranial atherosclerosis, including a moderate to severe proximal left M2 stenosis. 4. 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 5. Mild to moderate cervical carotid atherosclerosis without a significant stenosis.   EXAM: MRI BRAIN WITHOUT CONTRAST 03/27/2024 11:27:00 AM  IMPRESSION: 1. Roughly 4 cm right temporo-occipital junction cortical and whitematter T2/FLAIR hyperintense lesion. Follow up postcontrast Brain MRI recommended, but assuming no enhancement the constellation of recent imaging (including CT Perfusion) findings favors a low grade Glioma. Unusual PRES, post-ictal changes, and encephalitis were all considered but are felt unlikely. 2. Negative for acute infarct or intracranial hemorrhage. Moderate for age chronic cerebral white matter changes.  EXAM: MRI BRAIN WITH CONTRAST 03/27/2024 06:46:57 PM   TECHNIQUE: Multiplanar multisequence MRI of the head/brain was performed with the administration of intravenous contrast.   IMPRESSION: 1. No abnormal contrast enhancement of the right temporal lesion or elsewhere in the brain. 2. Primary differential considerations include low-grade glial neoplasm or nonspecific encephalitis.    HISTORY OF PRESENT ILLNESS 69 y.o. patient with history of pulmonary embolism, breast cancer and hypertension was admitted with acute onset right-sided numbness, right-sided weakness and slurred speech  HOSPITAL COURSE Patient  was given TNK to treat potential stroke.  However, MRI was negative for acute infarct, making TIA the most likely etiology of her symptoms.  CTA head and neck demonstrated severe proximal left M2 stenosis.  MRI brain without contrast demonstrated a T2 flair enhancing lesion in the right temporal occipital junction.  Repeat imaging with contrast demonstrated no abnormal enhancement.  Patient will need follow-up MRI with and without contrast in 3 months, as area is concerning for low-grade glioma.  Patient will need DAPT for 3 months followed by Plavix  alone.  Loop recorder was implanted prior to discharge, and cardiology will follow-up on monitor data.  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 ASPECTS 10.    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 Follow up postcontrast Brain MRI recommended, but assuming no enhancement the constellation of recent imaging (including CT Perfusion) findings favors a low grade Glioma. Unusual PRES, post-ictal changes, and encephalitis were all considered but are felt unlikely. Negative for acute infarct or intracranial hemorrhage. Moderate for age chronic cerebral white matter changes. MRI w contrast-no abnormal contrast enhancement of right temporal lesion or elsewhere EEG-normal 2D Echo EF 60-65% Loop recorder inserted 03/28/2024 LDL 50 HgbA1c 6.2 VTE prophylaxis - Lovenox  aspirin  81 mg daily prior to admission, now on aspirin  81 mg daily Therapy recommendations:  Outpatient PT Disposition: Home   Hypertension Home meds:, Lasix  spironolactone  Stable Blood Pressure Goal: BP less than 180/105    Hyperlipidemia Home meds:  crestor  20mg , resumed in hospital LDL 50, goal < 70 Continue statin at discharge   Dysphagia Patient has post-stroke  dysphagia, SLP consulted       Diet    Diet regular Room service appropriate? Yes; Fluid consistency: Thin        Advance diet as tolerated  RN Pressure Injury Documentation:     DISCHARGE EXAM  PHYSICAL EXAM General:  Alert, well-nourished, well-developed patient in no acute distress Psych:  Mood and affect appropriate for situation CV: Regular rate and rhythm on monitor Respiratory:  Regular, unlabored respirations on room air  NEURO:  Mental Status: AA&Ox3  Speech/Language: speech is without dysarthria or aphasia.   Cranial Nerves:  II: PERRL. Visual fields full.  III,  IV, VI: EOMI. Eyelids elevate symmetrically.  V: Sensation is intact to light touch and symmetrical to face.  VII: Smile is symmetrical.  VIII: hearing intact to voice. IX, X:  Phonation is normal.  XII: tongue is midline without fasciculations. Motor: 5/5 strength to all muscle groups tested.  Tone: is normal and bulk is normal Sensation- Intact to light touch bilaterally. Coordination: FTN intact bilaterally Gait- deferred  1a Level of Conscious.: 0 1b LOC Questions: 0 1c LOC Commands: 0 2 Best Gaze: 0 3 Visual: 0 4 Facial Palsy: 0 5a Motor Arm - left: 0 5b Motor Arm - Right: 0 6a Motor Leg - Left: 0 6b Motor Leg - Right: 0 7 Limb Ataxia: 0 8 Sensory: 0 9 Best Language: 0 10 Dysarthria: 0 11 Extinct. and Inatten.: 0 TOTAL: 0   Discharge Diet       Diet   Diet regular Room service appropriate? Yes; Fluid consistency: Thin   liquids  DISCHARGE PLAN Disposition: Home aspirin  81 mg daily and clopidogrel  75 mg daily for secondary stroke prevention for 3 months then clopidogrel  75 mg daily alone. Ongoing stroke risk factor control by Primary Care Physician at time of discharge Follow-up PCP Clark, Katherine K, NP in 2 weeks. Follow-up MRI brain with and without contrast in 3 months Follow-up in [facility] in 8 weeks, office to schedule an  appointment.   35 minutes were spent preparing discharge.  Cortney E Everitt Clint Kill , MSN, AGACNP-BC [facility] See Amion for schedule and pager information 03/28/2024 2:58 PM  I have personally obtained history,examined this patient, reviewed notes, independently viewed imaging studies, participated in medical decision making and plan of care.ROS completed by me personally and pertinent positives fully documented  I have made any additions or clarifications directly to the above note. Agree with note above.    Eather Popp, MD Medical Director [facility] Pager: [phone] 03/28/2024 4:27 PM

## 2024-03-28 NOTE — Progress Notes (Signed)
 Discharged to home accompanied by Heddie, NT and nephew.

## 2024-03-28 NOTE — Plan of Care (Signed)
" °  Problem: Ischemic Stroke/TIA Tissue Perfusion: Goal: Complications of ischemic stroke/TIA will be minimized Outcome: Progressing   Problem: Ischemic Stroke/TIA Tissue Perfusion: Goal: Complications of ischemic stroke/TIA will be minimized Outcome: Progressing   Problem: Education: Goal: Knowledge of General Education information will improve Description: Including pain rating scale, medication(s)/side effects and non-pharmacologic comfort measures Outcome: Progressing   Problem: Coping: Goal: Level of anxiety will decrease Outcome: Progressing   Problem: Safety: Goal: Ability to remain free from injury will improve Outcome: Progressing   Problem: Skin Integrity: Goal: Risk for impaired skin integrity will decrease Outcome: Progressing   "

## 2024-03-28 NOTE — Discharge Instructions (Addendum)
 Care After Your Loop Recorder  You have a [employer]onic Loop Recorder   Monitor your cardiac device site for redness, swelling, and drainage. Call the device clinic at [phone] if you experience these symptoms or fever/chills.  If you notice bleeding from your site, hold firm, but gently pressure with two fingers for 5 minutes. Dried blood on the steri-strips when removing the outer bandage is normal.   Keep the large square bandage on your site for 24 hours and then you may remove it yourself. Keep the steri-strips underneath in place.   You may shower after 72 hours / 3 days from your procedure with the steri-strips in place. They will usually fall off on their own, or may be removed after 10 days. Pat dry.   Avoid lotions, ointments, or perfumes over your incision until it is well-healed.  Please do not submerge in water until your site is completely healed.   Your device is MRI compatible.   Remote monitoring is used to monitor your cardiac device from home. This monitoring is scheduled every month by our office. It allows us  to keep an eye on the function of your device to ensure it is working properly.   Paige Hester, you were admitted with acute onset right sided weakness, numbness and slurred speech.  Your symptoms were initially thought to be due to a stroke, and you were given TNK to treat this.  Your MRI was negative for stroke, so your symptoms may have been caused by a TIA.  There was an  area of concern on your brian MRI, and you will need to have a follow up MRI with and without contrast in 3 months.  Please be seen in the stroke clinic in 8 weeks.

## 2024-03-28 NOTE — TOC Transition Note (Signed)
 Transition of Care [facility]) - Discharge Note   Patient Details  Name: Paige Hester MRN: [id number] Date of Birth: 1955-11-07  [facility] CM/SW Contact:  Andrez JULIANNA George, RN Phone Number: 03/28/2024, 10:19 AM   Clinical Narrative:    69 year old female with history of hypertension, pulmonary embolism due to prior breast cancer presents to the emergency department with acute onset right-sided numbness, weakness, and slurred speech.   Pt is from home alone. Her sister lives next door and can assist her if needed.  Pt drives but her sister can assist with transportation. She manages her own medications.  DME at home: cane/ shower seat  Pt is active with [facility] prior to admission. She asked to continue with their services. CM will call [facility] and make sure they don't need any orders from the hospital. If orders needed CM will fax them to [facility].   Pt has transportation home.   Final next level of care: OP Rehab Barriers to Discharge: No Barriers Identified   Patient Goals and CMS Choice   CMS Medicare.gov Compare Post Acute Care list provided to:: Patient Choice offered to / list presented to : Patient      Discharge Placement                       Discharge Plan and Services Additional resources added to the After Visit Summary for                                       Social Drivers of Health (SDOH) Interventions SDOH Screenings   Food Insecurity: No Food Insecurity (03/13/2024)  Housing: Low Risk (03/13/2024)  Transportation Needs: No Transportation Needs (03/13/2024)  Utilities: Not At Risk (03/16/2024)  Alcohol Screen: Low Risk (03/13/2024)  Depression (PHQ2-9): Low Risk (03/18/2024)  Financial Resource Strain: Medium Risk (03/13/2024)  Physical Activity: Insufficiently Active (03/13/2024)  Social Connections: Moderately Integrated (03/13/2024)  Stress: No Stress Concern Present (03/16/2024)  Tobacco Use:  Medium Risk (03/26/2024)  Health Literacy: Adequate Health Literacy (03/16/2024)     Readmission Risk Interventions     No data to display

## 2024-03-28 NOTE — Telephone Encounter (Unsigned)
 Copied from CRM #[id number]. Topic: Clinical - Medication Refill >> Mar 28, 2024  4:43 PM Alfonso HERO wrote: Medication: furosemide  (LASIX ) 20 MG tablet  Has the patient contacted their pharmacy? Yes (Agent: If no, request that the patient contact the pharmacy for the refill. If patient does not wish to contact the pharmacy document the reason why and proceed with request.) (Agent: If yes, when and what did the pharmacy advise?)  This is the patient's preferred pharmacy:  [facility] [address], [place] - [address] [place] [address] Phone: [phone] Fax: [phone]  Is this the correct pharmacy for this prescription? Yes If no, delete pharmacy and type the correct one.   Has the prescription been filled recently? Yes  Is the patient out of the medication? Yes  Has the patient been seen for an appointment in the last year OR does the patient have an upcoming appointment? Yes  Can we respond through MyChart? Yes  Agent: Please be advised that Rx refills may take up to 3 business days. We ask that you follow-up with your pharmacy.

## 2024-03-28 NOTE — Progress Notes (Signed)
 Pt was transfer to insert pre-recorder monitor of heart at 1330 and received at 1535 accompanied by RN.

## 2024-03-28 NOTE — TOC CAGE-AID Note (Signed)
 Transition of Care [facility]) - CAGE-AID Screening   Patient Details  Name: Paige Hester MRN: [id number] Date of Birth: 03/06/1956  Transition of Care [facility]) CM/SW Contact:    Andrez JULIANNA George, RN Phone Number: 03/28/2024, 10:15 AM   Clinical Narrative:  Pt denied the need for counseling resources  CAGE-AID Screening:    Have You Ever Felt You Ought to Cut Down on Your Drinking or Drug Use?: No Have People Annoyed You By Critizing Your Drinking Or Drug Use?: No Have You Felt Bad Or Guilty About Your Drinking Or Drug Use?: No Have You Ever Had a Drink or Used Drugs First Thing In The Morning to Steady Your Nerves or to Get Rid of a Hangover?: No CAGE-AID Score: 0  Substance Abuse Education Offered: Yes (refused)

## 2024-03-29 ENCOUNTER — Telehealth: Payer: Self-pay

## 2024-03-29 MED ORDER — FUROSEMIDE 20 MG PO TABS: 20.0000 mg | ORAL_TABLET | Freq: Every day | ORAL | 1 refills | Status: AC

## 2024-03-29 NOTE — Transitions of Care (Post Inpatient/ED Visit) (Signed)
" ° °  03/29/2024  Name: Paige Hester MRN: [id number] DOB: 29-Apr-1955  Today's TOC FU Call Status: Today's TOC FU Call Status:: Unsuccessful Call (1st Attempt) Unsuccessful Call (1st Attempt) Date: 03/29/24  Attempted to reach the patient regarding the most recent Inpatient/ED visit.  Follow Up Plan: Additional outreach attempts will be made to reach the patient to complete the Transitions of Care (Post Inpatient/ED visit) call.   Nabila Albarracin J. Bliss Tsang RN, MSN [facility], [facility] Health RN Care Manager Direct Dial: [phone]  Fax: [phone] Website: [email or web address]   "

## 2024-03-30 ENCOUNTER — Telehealth: Payer: Self-pay

## 2024-03-30 NOTE — Transitions of Care (Post Inpatient/ED Visit) (Signed)
" ° °  03/30/2024  Name: Paige Hester MRN: [id number] DOB: 07-12-1955  Today's TOC FU Call Status: Today's TOC FU Call Status:: Unsuccessful Call (2nd Attempt) Unsuccessful Call (2nd Attempt) Date: 03/30/24  Attempted to reach the patient regarding the most recent Inpatient/ED visit.  Follow Up Plan: Additional outreach attempts will be made to reach the patient to complete the Transitions of Care (Post Inpatient/ED visit) call.   Shona Prow RN, CCM [place]  VBCI-Population Health RN Care Manager [phone]  "

## 2024-03-31 ENCOUNTER — Telehealth: Payer: Self-pay

## 2024-03-31 NOTE — Telephone Encounter (Signed)
 Noted

## 2024-03-31 NOTE — Patient Instructions (Signed)
 Visit Information  Thank you for taking time to visit with me today. Please don't hesitate to contact me if I can be of assistance to you before our next scheduled telephone appointment.  Our next appointment is by telephone on 04/07/24 in the afternoon  Following is a copy of your care plan:   Goals Addressed             This Visit's Progress    VBCI Transitions of Care (TOC) Care Plan       Problems:  Recent Hospitalization for treatment of Ruled out CVA - possible TIA - implanted loop recorder monitor for afib and plan appropriately  No Specialist appointment Not currently scheduled with neuro - patient will discuss with PCP at hospital f/u appt 04/05/24  Goal:  Over the next 30 days, the patient will not experience hospital readmission  Interventions:  Transitions of Care: Doctor Visits  - discussed the importance of doctor visits Arranged PCP follow-up within 7 days - conference call to office and appt scheduled for 04/05/24 with PCP Post-op wound/incision care reviewed with patient/caregiver Reviewed Signs and symptoms of infection Discussed monitoring BP and patient states she has not yet today but usually does  Reviewed new Plavix  and educated on bleeding risks and when to notify provider  Patient Self Care Activities:  Attend all scheduled provider appointments Call pharmacy for medication refills 3-7 days in advance of running out of medications Call provider office for new concerns or questions  Notify RN Care Manager of TOC call rescheduling needs Participate in Transition of Care Program/Attend TOC scheduled calls Take medications as prescribed   Discuss neuro referral with PCP  Plan:  Telephone follow up appointment with care management team member scheduled for:  04/07/24 in the afternoon The patient has been provided with contact information for the care management team and has been advised to call with any health related questions or concerns.          Patient verbalizes understanding of instructions and care plan provided today and agrees to view in MyChart. Active MyChart status and patient understanding of how to access instructions and care plan via MyChart confirmed with patient.     Telephone follow up appointment with care management team member scheduled for: 04/07/24 The patient has been provided with contact information for the care management team and has been advised to call with any health related questions or concerns.   Please call the care guide team at [phone] if you need to cancel or reschedule your appointment.   Please call the Suicide and Crisis Lifeline: 988 call 1-800-273-TALK (toll free, 24 hour hotline) call 911 if you are experiencing a Mental Health or Behavioral Health Crisis or need someone to talk to.  Shona Prow RN, CCM [place]  VBCI-Population Health RN Care Manager [phone]

## 2024-03-31 NOTE — Transitions of Care (Post Inpatient/ED Visit) (Signed)
 "  03/31/2024  Name: Paige Paige MRN: [id number] DOB: 08-20-1955  Today's TOC FU Call Status: Today's TOC FU Call Status:: Successful TOC FU Call Completed TOC FU Call Complete Date: 03/31/24  Patient's Name and Date of Birth confirmed. DOB, Name  Transition Care Management Follow-up Telephone Call How have you been since you were released from the hospital?: Better Any questions or concerns?: No  Items Reviewed: Did you receive and understand the discharge instructions provided?: Yes Medications obtained,verified, and reconciled?: Yes (Medications Reviewed) Any new allergies since your discharge?: No Dietary orders reviewed?: NA Do you have support at home?: Yes People in Home [RPT]: alone Name of Support/Comfort Primary Source: sister lives next door, church family  Medications Reviewed Today: Medications Reviewed Today     Reviewed by Lauro Shona LABOR, RN (Registered Nurse) on 03/31/24 at [phone]  Med List Status: <None>   Medication Order Taking? Sig Documenting Provider Last Dose Status Informant  albuterol  (VENTOLIN  HFA) 108 (90 Base) MCG/ACT inhaler [id number] Yes Inhale 2 puffs into the lungs every 6 (six) hours as needed for wheezing or shortness of breath. Lang Dover, MD  Active Self, Pharmacy Records  anastrozole  (ARIMIDEX ) 1 MG tablet [id number] Yes Take 1 tablet (1 mg total) by mouth daily. Babara Call, MD  Active Self, Pharmacy Records  aspirin  EC 81 MG tablet [id number] Yes Take 1 tablet (81 mg total) by mouth daily. Swallow whole. de Clint Kill, Cortney E, NP  Active   carvedilol  (COREG ) 6.25 MG tablet [id number] Yes Take 1 tablet (6.25 mg total) by mouth 2 (two) times daily with a meal. for blood pressure. Gretta Comer POUR, NP  Active Self, Pharmacy Records  Cholecalciferol (VITAMIN D3) 1000 UNITS tablet [id number] Yes Take 1,000 Units by mouth daily. [provider]  Active Self, Pharmacy Records  clopidogrel  (PLAVIX ) 75 MG tablet [id number] Yes Take 1  tablet (75 mg total) by mouth daily. de Clint Kill, Cortney E, NP  Active   cyclobenzaprine  (FLEXERIL ) 10 MG tablet [id number] Yes Take 1 tablet (10 mg total) by mouth at bedtime. For back pain  Patient taking differently: Take 10 mg by mouth as needed for muscle spasms. For back pain   Clark, Katherine K, NP  Active Self, Pharmacy Records  fluticasone  (FLONASE ) 50 MCG/ACT nasal spray [id number] Yes Place 1 spray into both nostrils 2 (two) times daily as needed for allergies or rhinitis. Gretta Comer POUR, NP  Active Self, Pharmacy Records  furosemide  (LASIX ) 20 MG tablet [id number] Yes Take 1 tablet (20 mg total) by mouth daily. Gretta Comer POUR, NP  Active   Iron -Vitamin C  65-125 MG TABS [id number] Yes Take 1 tablet by mouth daily. Babara Call, MD  Active Self, Pharmacy Records  loratadine  (CLARITIN ) 10 MG tablet [id number] Yes Take 10 mg by mouth daily. [provider]  Active Self, Pharmacy Records  Melatonin 10 MG TABS [id number] Yes Take 10 mg by mouth at bedtime. [provider]  Active Self, Pharmacy Records  Oyster Shell (OYSTER CALCIUM ) 500 MG TABS tablet [id number] Yes Take 600 mg of elemental calcium  by mouth 2 (two) times daily. [provider]  Active Self, Pharmacy Records  pantoprazole  (PROTONIX ) 40 MG tablet [id number] Yes Take 1 tablet (40 mg total) by mouth every other day. de Clint Kill, Cortney E, NP  Active   rosuvastatin  (CRESTOR ) 20 MG tablet [id number] Yes Take 1 tablet (20 mg total) by mouth daily. Gollan, Timothy J, MD  Active Self, Pharmacy Records  spironolactone  (ALDACTONE ) 25 MG  tablet [id number] Yes Take 25 mg by mouth daily. [provider]  Active Self, Pharmacy Records            Home Care and Equipment/Supplies: Were Home Health Services Ordered?: No Any new equipment or medical supplies ordered?: No  Functional Questionnaire: Do you need assistance with bathing/showering or dressing?: No Do you need assistance with meal  preparation?: No Do you need assistance with eating?: No Do you have difficulty maintaining continence: No Do you need assistance with getting out of bed/getting out of a chair/moving?: No Do you have difficulty managing or taking your medications?: No  Follow up appointments reviewed: PCP Follow-up appointment confirmed?: Yes Date of PCP follow-up appointment?: 04/05/24 Follow-up Provider: Comer Gaskins, NP Specialist Hospital Follow-up appointment confirmed?: No (neuro- patient will discuss with PCP at appt) Do you need transportation to your follow-up appointment?: No Do you understand care options if your condition(s) worsen?: Yes-patient verbalized understanding  SDOH Interventions Today    Flowsheet Row Most Recent Value  SDOH Interventions   Food Insecurity Interventions Intervention Not Indicated  Housing Interventions Intervention Not Indicated  Transportation Interventions Intervention Not Indicated  Utilities Interventions Intervention Not Indicated    Goals Addressed             This Visit's Progress    VBCI Transitions of Care (TOC) Care Plan       Problems:  Recent Hospitalization for treatment of Ruled out CVA - possible TIA - implanted loop recorder monitor for afib and plan appropriately  No Specialist appointment Not currently scheduled with neuro - patient will discuss with PCP at hospital f/u appt 04/05/24  Goal:  Over the next 30 days, the patient will not experience hospital readmission  Interventions:  Transitions of Care: Doctor Visits  - discussed the importance of doctor visits Arranged PCP follow-up within 7 days - conference call to office and appt scheduled for 04/05/24 with PCP Post-op wound/incision care reviewed with patient/caregiver Reviewed Signs and symptoms of infection Discussed monitoring BP and patient states she has not yet today but usually does  Reviewed new Plavix  and educated on bleeding risks and when to notify  provider  Patient Self Care Activities:  Attend all scheduled provider appointments Call pharmacy for medication refills 3-7 days in advance of running out of medications Call provider office for new concerns or questions  Notify RN Care Manager of TOC call rescheduling needs Participate in Transition of Care Program/Attend TOC scheduled calls Take medications as prescribed   Discuss neuro referral with PCP  Plan:  Telephone follow up appointment with care management team member scheduled for:  04/07/24 in the afternoon The patient has been provided with contact information for the care management team and has been advised to call with any health related questions or concerns.          Shona Prow RN, CCM [facility]  VBCI-Population Health RN Care Manager [phone]  "

## 2024-04-01 ENCOUNTER — Encounter: Payer: Self-pay | Admitting: Student

## 2024-04-01 ENCOUNTER — Ambulatory Visit: Attending: Student | Admitting: Student

## 2024-04-01 VITALS — BP 136/80 | HR 65 | Ht 65.0 in | Wt 248.0 lb

## 2024-04-01 DIAGNOSIS — I771 Stricture of artery: Secondary | ICD-10-CM | POA: Diagnosis present

## 2024-04-01 DIAGNOSIS — I6523 Occlusion and stenosis of bilateral carotid arteries: Secondary | ICD-10-CM | POA: Diagnosis present

## 2024-04-01 DIAGNOSIS — I10 Essential (primary) hypertension: Secondary | ICD-10-CM | POA: Insufficient documentation

## 2024-04-01 DIAGNOSIS — I342 Nonrheumatic mitral (valve) stenosis: Secondary | ICD-10-CM | POA: Diagnosis present

## 2024-04-01 DIAGNOSIS — E782 Mixed hyperlipidemia: Secondary | ICD-10-CM | POA: Insufficient documentation

## 2024-04-01 DIAGNOSIS — I639 Cerebral infarction, unspecified: Secondary | ICD-10-CM | POA: Insufficient documentation

## 2024-04-01 NOTE — Patient Instructions (Signed)
 Medication Instructions:   Your physician recommends that you continue on your current medications as directed. Please refer to the Current Medication list given to you today.    *If you need a refill on your cardiac medications before your next appointment, please call your pharmacy*  Lab Work:  None ordered at this time   If you have labs (blood work) drawn today and your tests are completely normal, you will receive your results only by:  MyChart Message (if you have MyChart) OR  A paper copy in the mail If you have any lab test that is abnormal or we need to change your treatment, we will call you to review the results.  Testing/Procedures:  None ordered at this time   Referrals:  None ordered at this time   Follow-Up:  At [facility], you and your health needs are our priority.  As part of our continuing mission to provide you with exceptional heart care, our providers are all part of one team.  This team includes your primary Cardiologist (physician) and Advanced Practice Providers or APPs (Physician Assistants and Nurse Practitioners) who all work together to provide you with the care you need, when you need it.  Your next appointment:   5 - 6 month(s)  Provider:    Evalene Lunger, MD or Barnie Hila, NP    We recommend signing up for the patient portal called MyChart.  Sign up information is provided on this After Visit Summary.  MyChart is used to connect with patients for Virtual Visits (Telemedicine).  Patients are able to view lab/test results, encounter notes, upcoming appointments, etc.  Non-urgent messages can be sent to your provider as well.   To learn more about what you can do with MyChart, go to [email or web address].

## 2024-04-01 NOTE — Progress Notes (Signed)
 "  Cardiology Clinic Note   Date: 04/01/2024 ID: Paige Hester, DOB 24-Apr-1955, MRN [id number]  Primary Cardiologist:  Evalene Lunger, MD  Chief Complaint   Paige Hester is a 69 y.o. female who presents to the clinic today for hospital follow up.   Patient Profile   Paige Hester is followed by Dr. Gollan for the history outlined below.      Past medical history significant for: Mitral stenosis. Echo 03/27/2024: EF 60 to 65%.  No RWMA.  Indeterminate diastolic parameters.  Normal RV size/function.  Mild to moderate mitral valve stenosis, mean gradient 7 mmHg.  Moderate to severe MAC.  Aortic valve sclerosis/calcification without stenosis. Carotid artery stenosis. Carotid duplex 03/19/2023: Bilateral ICA 1 to 39%.  Bilateral ECA > 50%.  Bilateral subclavian arteries stenotic.  Right vertebral artery demonstrates bidirectional flow. Hypertension. Hyperlipidemia. Lipid panel 03/27/2024: LDL 50, HDL 64, TG 42, total 122. OSA. GERD. Prediabetes. PE/DVT. Breast cancer. S/p lumpectomy March 2025. CVA. CTA head and neck 03/26/2024: No large vessel occlusion.  No evidence of acute core infarct or penumbra on perfusion imaging.  Intracranial atherosclerosis including moderate to severe proximal left M2 stenosis.  Approximately 70% stenosis of the origin of the right subclavian artery.  Mild to moderate cervical carotid atherosclerosis without significant stenosis. MRI brain 03/27/2024: Roughly 4 cm right temporal lobe-occipital junction cortical and white matter T2/FLAIR hyperintense lesion.  Findings favor low-grade glioma.  Negative for acute infarct or intracranial hemorrhage.  Moderate for age chronic cerebral white matter changes. Loop recorder implantation 03/28/2024.  In summary, patient was previously seen by Dr. Okey in October 2014 for cardiac risk stratification.  Carotid bruit was heard on exam.  She underwent carotid ultrasound which showed mild carotid plaque  bilaterally.  Echo showed EF 65 to 70%, no RWMA, Grade II DD, mild MR.  Patient establish care with Dr. Gollan on 08/16/2020 for shortness of breath.  Patient was diagnosed with PE and April 2022 after long car drive to [place] .  Echo April 2022 showed normal LV/RV function as detailed above.  It was recommended patient utilize compression hose and take Eliquis  2.5 twice daily prophylactically for long car trips or plane rides.  Patient was seen in the office on 05/15/2023 for routine follow-up and preoperative risk assessment prior to lumpectomy.  She was doing well at that time.  She reported occasional lower extremity edema best in the morning and progressing throughout the day.  She noted stable dyspnea particularly with heavier exertion ever since PE.  Her echo was updated which showed hyperdynamic LV function as detailed above.  Upon follow-up in April 2025 patient reported labile BP getting as high as 170 systolic.  She had switched amlodipine  to daytime dosing to take along with losartan  and BP seemed improved.  She reported continued dyspnea with extended walking and needing to take rest breaks every quarter mile.  She reported mild lower extremity edema.   Patient presented to the ED on 03/26/2024 with right sided extremity weakness and slurred speech. She reported she last felt normal at 830 am. Code stroke was called. BP initially 200s systolic. TNK administered after improved BP. EKG demonstrated NSR with first degree AV block and occasional PVCs. Patient had loop recorder implanted prior to discharge.      History of Present Illness    Today, patient is here alone. She reports she is doing well since hospital discharge. She does not have any residual weakness from her stroke. Patient denies shortness of  breath, dyspnea on exertion, lower extremity edema, orthopnea or PND. No chest pain, pressure, or tightness. No palpitations.  She is active attending thai chi classes and chair yoga twice a week.  She completed radiation treatment for breast cancer in June.     ROS: All other systems reviewed and are otherwise negative except as noted in History of Present Illness.  EKGs/Labs Reviewed    EKG Interpretation Date/Time:  Friday April 01 2024 11:20:02 EST Ventricular Rate:  65 PR Interval:  242 QRS Duration:  66 QT Interval:  428 QTC Calculation: 445 R Axis:   14  Text Interpretation: Sinus rhythm with 1st degree A-V block When compared with ECG of 26-Mar-2024 11:41, No significant change was found Confirmed by Loistine Sober [phone]) on 04/01/2024 11:23:48 AM   03/26/2024: ALT 12; AST 22; BUN 12; Creatinine, Ser 0.90; Potassium 4.5; Sodium 141   03/26/2024: Hemoglobin 11.9; WBC 5.2    Physical Exam    VS:  BP 136/80 (BP Location: Left Arm, Patient Position: Sitting, Cuff Size: Large)   Pulse 65   Ht 5' 5 (1.651 m)   Wt 248 lb (112.5 kg)   SpO2 97%   BMI 41.27 kg/m  , BMI Body mass index is 41.27 kg/m.  GEN: Well nourished, well developed, in no acute distress. Neck: No JVD or carotid bruits. Cardiac:  RRR. 2/6 systolic murmur. No rubs or gallops.   Respiratory:  Respirations regular and unlabored. Clear to auscultation without rales, wheezing or rhonchi. GI: Soft, nontender, nondistended. Extremities: Radials/DP/PT 2+ and equal bilaterally. No clubbing or cyanosis. No edema   Skin: Warm and dry, no rash. Neuro: Strength intact.  Assessment & Plan   Mitral stenosis Echo January 2026 showed normal LV/RV function, no RWMA, mild to moderate mitral stenosis.  No history of rheumatic fever. Mother with MVR. Patient denies shortness of breath, lower extremity edema, orthopnea or PND. She is active attending thai chi and seated yoga classes twice a week. Euvolemic and well compensated on exam. 2/6 systolic murmur.  - Continue annual surveillance with repeat echo in January 2027.    Carotid artery stenosis/bilateral subclavian artery stenosis Carotid duplex January  2025 showed bilateral ICA 1 to 39%, bilateral ECA > 50%, bilateral subclavian artery stenosis. CTA head and neck January 2026 demonstrated Mild to moderate cervical carotid atherosclerosis without significant stenosis, approximately 70% stenosis of the origin of the right subclavian artery. Patient denies lightheadedness, dizziness, presyncope or syncope.  - Continue aspirin , Plavix , rosuvastatin .   Hypertension BP today 136/80. No dizziness or headaches reported.  -Continue amlodipine , Hyzaar.   Hyperlipidemia LDL 50 January 2026, at goal. -Continue rosuvastatin . -Continue to follow with PCP.  Cryptogenic stroke Patient presented to the ED 03/26/2024 with right-sided extremity weakness and slurred speech.  She was felt to have a left hemispheric TIA in the setting of left M1 stenosis status post IV TNK administration.  Loop recorder was implanted during hospital admission.  Patient denies residual weakness from her stroke. Grip strength is 5/5 and equal.  - Continue aspirin , Plavix , rosuvastatin . - Continue to follow with neurology.  Disposition: Return in 6 months.          Signed, Sober HERO. Sequan Auxier, DNP, NP-C  "

## 2024-04-04 ENCOUNTER — Telehealth: Payer: Self-pay

## 2024-04-04 ENCOUNTER — Inpatient Hospital Stay: Admitting: Licensed Clinical Social Worker

## 2024-04-04 DIAGNOSIS — I639 Cerebral infarction, unspecified: Secondary | ICD-10-CM

## 2024-04-04 NOTE — Patient Instructions (Signed)
 Visit Information  Thank you for taking time to visit with me today. Please don't hesitate to contact me if I can be of assistance to you.  Call 911 anytime you think you may need emergency care. For example, call if:   You have symptoms of a stroke. These may include: Sudden numbness, tingling, weakness, or loss of movement in your face, arm, or leg, especially on only one side of your body. Sudden vision changes. Sudden trouble speaking. Sudden confusion or trouble understanding simple statements. Sudden problems with walking or balance. A sudden, severe headache that is different from past headaches    Patient verbalizes understanding of instructions and care plan provided today and agrees to view in MyChart. Active MyChart status and patient understanding of how to access instructions and care plan via MyChart confirmed with patient.     The patient has been provided with contact information for the care management team and has been advised to call with any health related questions or concerns.   Please call the care guide team at [phone] if you need to cancel or reschedule your appointment.   Please call the Suicide and Crisis Lifeline: 988 if you are experiencing a Mental Health or Behavioral Health Crisis or need someone to talk to.  Everli Rother J. Carlitos Bottino RN, MSN [facility], [facility] RN Care Manager Direct Dial: [phone]  Fax: [phone] Website: [email or web address]

## 2024-04-04 NOTE — Transitions of Care (Post Inpatient/ED Visit) (Signed)
 Stroke Discharge Follow-up   04/04/2024 Name:  Paige Hester MRN:  [id number] DOB:  10/29/1955  Subjective: Paige Hester is a 69 y.o. year old female who is a primary care patient of Clark, Katherine K, NP An Emmi alert was received indicating patient responded to questions: Scheduled a follow-up appointment?. I reached out by phone to follow up on the alert and spoke to Patient.  Care Management Interventions: Reviewed red alert,  Reviewed upcoming appointments.    Advised patient that they would continue to get automated EMMI-Stroke post discharge calls to assess how they are doing following recent hospitalization and will receive a call from a nurse if any of their responses were abnormal. Patient voiced understanding and was appreciative of follow up call.  Follow up plan: Continue with [facility] nurse enrollment   Derl Abalos J. Adanya Sosinski RN, MSN [facility]  [facility], [facility] RN Care Manager Direct Dial: [phone]  Fax: [phone] Website: [email or web address]

## 2024-04-04 NOTE — Transitions of Care (Post Inpatient/ED Visit) (Signed)
 Stroke Discharge Follow-up   04/04/2024 Name:  ZAYDA ANGELL MRN:  [id number] DOB:  Aug 20, 1955  Subjective: HUBERT DERSTINE is a 69 y.o. year old female who is a primary care patient of Clark, Katherine K, NP An Emmi alert was received indicating patient responded to questions: Scheduled a follow-up appointment?. I reached out by phone to follow up on the alert and spoke to no one. HIPAA compliant voice message left.    Care Management Interventions: Reviewed red alert.    Follow up plan: RN CM will attempt the next business day.      Rory Montel J. Chesni Vos RN, MSN [facility], [facility] RN Care Manager Direct Dial: [phone]  Fax: [phone] Website: [email or web address]

## 2024-04-05 ENCOUNTER — Ambulatory Visit: Admitting: Primary Care

## 2024-04-05 ENCOUNTER — Encounter: Payer: Self-pay | Admitting: Primary Care

## 2024-04-05 ENCOUNTER — Telehealth: Payer: Self-pay | Admitting: Primary Care

## 2024-04-05 VITALS — BP 136/62 | HR 63 | Temp 97.9°F | Ht 65.0 in | Wt 252.1 lb

## 2024-04-05 DIAGNOSIS — C719 Malignant neoplasm of brain, unspecified: Secondary | ICD-10-CM | POA: Insufficient documentation

## 2024-04-05 DIAGNOSIS — G459 Transient cerebral ischemic attack, unspecified: Secondary | ICD-10-CM

## 2024-04-05 DIAGNOSIS — Z8673 Personal history of transient ischemic attack (TIA), and cerebral infarction without residual deficits: Secondary | ICD-10-CM | POA: Insufficient documentation
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
## 2024-04-05 NOTE — Assessment & Plan Note (Addendum)
 With recent hospitalization.  Hospital notes, labs, imaging reviewed.  Continue clopidogrel  75 mg daily, aspirin  81 mg daily, rosuvastatin  20 mg daily. LDL at goal.  Referral placed to neurology.

## 2024-04-05 NOTE — Assessment & Plan Note (Signed)
 Incidental finding.  Will repeat MRI brain with and without contrast in 3 months

## 2024-04-05 NOTE — Telephone Encounter (Unsigned)
 Copied from CRM [id number]. Topic: Referral - Question >> Apr 05, 2024  2:51 PM Hadassah PARAS wrote: Reason for CRM: Pt is calling to advise that [facility] had contacted her  in regards to a referral sent by PCP. Clark advised she does not recall sending a referral over and pt wanting to call back w this information. Any questions please contact pt [phone]

## 2024-04-05 NOTE — Patient Instructions (Signed)
 We will repeat your MRI in 3 months.  I will mark my calendar but please mark yours.  Continue clopidogrel  75 mg daily along with all of your other medications.  Schedule follow-up with me for July 2026.  You will either be contacted via phone regarding your referral to neurology, or you may receive a letter on your MyChart portal from our referral team with instructions for scheduling an appointment. Please let us  know if you have not been contacted by anyone within two weeks.  It was a pleasure to see you today!

## 2024-04-06 NOTE — Telephone Encounter (Signed)
 Per 03/18/24 OV notes, Mallie ordered VAS US  carotid.   Spoke with pt relaying info above. Pt verbalizes understanding and will contact [facility] to schedule US .

## 2024-04-07 ENCOUNTER — Telehealth: Payer: Self-pay

## 2024-04-07 ENCOUNTER — Encounter

## 2024-04-07 NOTE — Patient Instructions (Signed)
 Visit Information  Thank you for taking time to visit with me today. Please don't hesitate to contact me if I can be of assistance to you before our next scheduled telephone appointment.  Our next appointment is by telephone on 04/07/24 in the afternoon  Following is a copy of your care plan:   Goals Addressed             This Visit's Progress    VBCI Transitions of Care (TOC) Care Plan       Problems:  Recent Hospitalization for treatment of Ruled out CVA - possible TIA - implanted loop recorder monitor for afib and plan appropriately  No Specialist appointment Not currently scheduled with neuro - patient will discuss with PCP at hospital f/u appt 04/05/24  Goal:  Over the next 30 days, the patient will not experience hospital readmission  Interventions:  Transitions of Care: Doctor Visits  - discussed the importance of doctor visits Arranged PCP follow-up within 7 days - conference call to office and appt scheduled for 04/05/24 with PCP Post-op wound/incision care reviewed with patient/caregiver Reviewed Signs and symptoms of infection Discussed monitoring BP and patient states she has not yet today but usually does  Reviewed new Plavix  and educated on bleeding risks and when to notify provider Update 04/07/24: Patient saw her MD 04/05/24 and has sent referral to neuro - is hoping to see neuro closer to her in [place] but will see Dr Rosemarie - Patient states she is attending [facility] for cancer patients where she exercises 2x/week and also goest to outpatient PT - patient denied back pain during call because she states she is laying down - patient states she was about to take a nap - agreed to a short call. - see assessments for more info  Patient Self Care Activities:  Attend all scheduled provider appointments Call pharmacy for medication refills 3-7 days in advance of running out of medications Call provider office for new concerns or questions  Notify RN Care Manager of TOC call  rescheduling needs Participate in Transition of Care Program/Attend TOC scheduled calls Take medications as prescribed   Discuss neuro referral with PCP  Plan:  Telephone follow up appointment with care management team member scheduled for:  04/14/24 in the afternoon The patient has been provided with contact information for the care management team and has been advised to call with any health related questions or concerns.         Patient verbalizes understanding of instructions and care plan provided today and agrees to view in MyChart. Active MyChart status and patient understanding of how to access instructions and care plan via MyChart confirmed with patient.     Telephone follow up appointment with care management team member scheduled for: 04/14/24 The patient has been provided with contact information for the care management team and has been advised to call with any health related questions or concerns.   Please call the care guide team at [phone] if you need to cancel or reschedule your appointment.   Please call the Suicide and Crisis Lifeline: 988 call 1-800-273-TALK (toll free, 24 hour hotline) call 911 if you are experiencing a Mental Health or Behavioral Health Crisis or need someone to talk to.  Shona Prow RN, CCM [place]  VBCI-Population Health RN Care Manager [phone]
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
## 2024-04-07 NOTE — Telephone Encounter (Signed)
 [facility] CSW Progress Note  Clinical Social Worker attempted to reach patient to schedule advance directives as requested by covering clinical child psychotherapist. No Answer. CSW Left VM stating purpose of call. CSW will attempt to reach again.     Lizbeth Sprague, LCSW Clinical Social Worker [facility]

## 2024-04-11 ENCOUNTER — Telehealth: Payer: Self-pay

## 2024-04-11 NOTE — Telephone Encounter (Signed)
 [facility] CSW Progress Note  Clinical Social Worker contacted patient on this date to reschedule Journalist, [employer]. CSW spoke with patient, AD scheduled for 2/05 at 10:30    Lizbeth Sprague, LCSW Clinical Social Worker [facility]

## 2024-04-13 ENCOUNTER — Inpatient Hospital Stay: Admitting: Occupational Therapy

## 2024-04-13 DIAGNOSIS — R293 Abnormal posture: Secondary | ICD-10-CM

## 2024-04-13 DIAGNOSIS — L905 Scar conditions and fibrosis of skin: Secondary | ICD-10-CM
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
## 2024-04-14 ENCOUNTER — Other Ambulatory Visit: Payer: Self-pay | Admitting: Cardiovascular Disease

## 2024-04-14 DIAGNOSIS — E782 Mixed hyperlipidemia: Secondary | ICD-10-CM

## 2024-04-16 ENCOUNTER — Encounter: Payer: Self-pay | Admitting: Cardiovascular Disease

## 2024-04-16 DIAGNOSIS — E782 Mixed hyperlipidemia: Secondary | ICD-10-CM

## 2024-04-18 MED ORDER — ROSUVASTATIN CALCIUM 20 MG PO TABS: 20.0000 mg | ORAL_TABLET | Freq: Every day | ORAL | 3 refills | Status: AC

## 2024-04-19 ENCOUNTER — Encounter

## 2024-04-21 ENCOUNTER — Telehealth: Payer: Self-pay

## 2024-04-21 ENCOUNTER — Inpatient Hospital Stay: Attending: Oncology

## 2024-04-21 NOTE — Telephone Encounter (Signed)
 [facility] CSW Progress Note  Engineer, Maintenance (it) spoke with patient due to missed appointment. Patient rescheduled for 2/09    Lizbeth Sprague, LCSW Clinical Social Worker [facility]

## 2024-04-25 ENCOUNTER — Inpatient Hospital Stay: Attending: Oncology

## 2024-04-28 ENCOUNTER — Encounter

## 2024-04-28 ENCOUNTER — Ambulatory Visit

## 2024-05-02 ENCOUNTER — Encounter (INDEPENDENT_AMBULATORY_CARE_PROVIDER_SITE_OTHER): Admitting: Vascular Surgery

## 2024-05-29 ENCOUNTER — Ambulatory Visit

## 2024-06-02 ENCOUNTER — Inpatient Hospital Stay: Admitting: *Deleted

## 2024-06-29 ENCOUNTER — Ambulatory Visit

## 2024-07-30 ENCOUNTER — Ambulatory Visit

## 2024-08-24 ENCOUNTER — Inpatient Hospital Stay

## 2024-08-24 ENCOUNTER — Inpatient Hospital Stay: Admitting: Oncology

## 2024-08-30 ENCOUNTER — Ambulatory Visit

## 2024-09-30 ENCOUNTER — Ambulatory Visit

## 2024-10-31 ENCOUNTER — Ambulatory Visit

## 2024-12-01 ENCOUNTER — Ambulatory Visit

## 2025-01-01 ENCOUNTER — Ambulatory Visit

## 2025-02-01 ENCOUNTER — Ambulatory Visit

## 2025-03-04 ENCOUNTER — Ambulatory Visit

## 2025-03-20 ENCOUNTER — Ambulatory Visit

## 2025-04-04 ENCOUNTER — Ambulatory Visit
